# Patient Record
Sex: Female | Born: 1940 | Race: White | Hispanic: No | Marital: Married
Health system: Southern US, Community
[De-identification: ages and names within clinical notes are randomized; demographics above are authoritative.]

## PROBLEM LIST (undated history)

## (undated) DIAGNOSIS — C801 Malignant (primary) neoplasm, unspecified: Secondary | ICD-10-CM

## (undated) DIAGNOSIS — F419 Anxiety disorder, unspecified: Secondary | ICD-10-CM

## (undated) DIAGNOSIS — E079 Disorder of thyroid, unspecified: Secondary | ICD-10-CM

## (undated) DIAGNOSIS — J449 Chronic obstructive pulmonary disease, unspecified: Secondary | ICD-10-CM

## (undated) HISTORY — PX: ABDOMINAL HYSTERECTOMY: SHX81

## (undated) HISTORY — PX: COLON SURGERY: SHX602

## (undated) HISTORY — DX: Disorder of thyroid, unspecified: E07.9

## (undated) HISTORY — DX: Anxiety disorder, unspecified: F41.9

---

## 2002-09-05 ENCOUNTER — Encounter: Admission: RE | Admit: 2002-09-05 | Discharge: 2002-09-05 | Payer: Self-pay | Admitting: Family Medicine

## 2002-09-05 ENCOUNTER — Encounter: Payer: Self-pay | Admitting: Family Medicine

## 2006-08-17 ENCOUNTER — Ambulatory Visit (HOSPITAL_BASED_OUTPATIENT_CLINIC_OR_DEPARTMENT_OTHER): Admission: RE | Admit: 2006-08-17 | Discharge: 2006-08-18 | Payer: Self-pay | Admitting: Orthopedic Surgery

## 2007-11-14 ENCOUNTER — Ambulatory Visit: Payer: Self-pay | Admitting: Vascular Surgery

## 2008-09-03 ENCOUNTER — Ambulatory Visit: Payer: Self-pay | Admitting: Vascular Surgery

## 2008-09-19 ENCOUNTER — Ambulatory Visit: Payer: Self-pay | Admitting: Vascular Surgery

## 2009-01-20 ENCOUNTER — Encounter: Payer: Self-pay | Admitting: Gastroenterology

## 2009-02-26 ENCOUNTER — Encounter: Payer: Self-pay | Admitting: Gastroenterology

## 2009-03-09 ENCOUNTER — Encounter: Payer: Self-pay | Admitting: Gastroenterology

## 2009-03-11 ENCOUNTER — Encounter: Payer: Self-pay | Admitting: Gastroenterology

## 2009-03-12 ENCOUNTER — Encounter: Payer: Self-pay | Admitting: Gastroenterology

## 2009-03-12 ENCOUNTER — Encounter: Admission: RE | Admit: 2009-03-12 | Discharge: 2009-03-12 | Payer: Self-pay | Admitting: General Surgery

## 2009-03-17 ENCOUNTER — Telehealth: Payer: Self-pay | Admitting: Gastroenterology

## 2009-03-18 DIAGNOSIS — C20 Malignant neoplasm of rectum: Secondary | ICD-10-CM | POA: Insufficient documentation

## 2009-03-20 ENCOUNTER — Ambulatory Visit: Payer: Self-pay | Admitting: Gastroenterology

## 2009-03-26 ENCOUNTER — Ambulatory Visit (HOSPITAL_COMMUNITY): Admission: RE | Admit: 2009-03-26 | Discharge: 2009-03-26 | Payer: Self-pay | Admitting: Gastroenterology

## 2009-03-26 ENCOUNTER — Ambulatory Visit: Payer: Self-pay | Admitting: Gastroenterology

## 2009-04-02 ENCOUNTER — Telehealth: Payer: Self-pay | Admitting: Gastroenterology

## 2009-04-03 ENCOUNTER — Ambulatory Visit: Payer: Self-pay | Admitting: Oncology

## 2009-04-08 ENCOUNTER — Ambulatory Visit: Admission: RE | Admit: 2009-04-08 | Discharge: 2009-06-02 | Payer: Self-pay | Admitting: Radiation Oncology

## 2009-04-21 LAB — CBC WITH DIFFERENTIAL/PLATELET
BASO%: 0.5 % (ref 0.0–2.0)
Basophils Absolute: 0 10*3/uL (ref 0.0–0.1)
EOS%: 2.3 % (ref 0.0–7.0)
Eosinophils Absolute: 0.1 10*3/uL (ref 0.0–0.5)
HCT: 37.6 % (ref 34.8–46.6)
HGB: 13 g/dL (ref 11.6–15.9)
LYMPH%: 31.7 % (ref 14.0–49.7)
MCH: 29.4 pg (ref 25.1–34.0)
MCHC: 34.5 g/dL (ref 31.5–36.0)
MCV: 85.2 fL (ref 79.5–101.0)
MONO#: 0.6 10*3/uL (ref 0.1–0.9)
MONO%: 10.6 % (ref 0.0–14.0)
NEUT#: 3 10*3/uL (ref 1.5–6.5)
NEUT%: 54.9 % (ref 38.4–76.8)
Platelets: 258 10*3/uL (ref 145–400)
RBC: 4.41 10*6/uL (ref 3.70–5.45)
RDW: 13.1 % (ref 11.2–14.5)
WBC: 5.5 10*3/uL (ref 3.9–10.3)
lymph#: 1.7 10*3/uL (ref 0.9–3.3)

## 2009-05-06 LAB — COMPREHENSIVE METABOLIC PANEL
ALT: 11 U/L (ref 0–35)
AST: 15 U/L (ref 0–37)
Albumin: 3.9 g/dL (ref 3.5–5.2)
Alkaline Phosphatase: 44 U/L (ref 39–117)
BUN: 13 mg/dL (ref 6–23)
CO2: 27 mEq/L (ref 19–32)
Calcium: 9.2 mg/dL (ref 8.4–10.5)
Chloride: 103 mEq/L (ref 96–112)
Creatinine, Ser: 0.69 mg/dL (ref 0.40–1.20)
Glucose, Bld: 99 mg/dL (ref 70–99)
Potassium: 4.5 mEq/L (ref 3.5–5.3)
Sodium: 139 mEq/L (ref 135–145)
Total Bilirubin: 0.4 mg/dL (ref 0.3–1.2)
Total Protein: 6.7 g/dL (ref 6.0–8.3)

## 2009-05-06 LAB — CBC WITH DIFFERENTIAL/PLATELET
BASO%: 0.2 % (ref 0.0–2.0)
Basophils Absolute: 0 10*3/uL (ref 0.0–0.1)
EOS%: 4.1 % (ref 0.0–7.0)
Eosinophils Absolute: 0.1 10*3/uL (ref 0.0–0.5)
HCT: 36.5 % (ref 34.8–46.6)
HGB: 12.5 g/dL (ref 11.6–15.9)
LYMPH%: 15.2 % (ref 14.0–49.7)
MCH: 29.6 pg (ref 25.1–34.0)
MCHC: 34.2 g/dL (ref 31.5–36.0)
MCV: 86.4 fL (ref 79.5–101.0)
MONO#: 0.4 10*3/uL (ref 0.1–0.9)
MONO%: 12.5 % (ref 0.0–14.0)
NEUT#: 2.3 10*3/uL (ref 1.5–6.5)
NEUT%: 68 % (ref 38.4–76.8)
Platelets: 146 10*3/uL (ref 145–400)
RBC: 4.22 10*6/uL (ref 3.70–5.45)
RDW: 13 % (ref 11.2–14.5)
WBC: 3.4 10*3/uL — ABNORMAL LOW (ref 3.9–10.3)
lymph#: 0.5 10*3/uL — ABNORMAL LOW (ref 0.9–3.3)

## 2009-05-15 LAB — CBC WITH DIFFERENTIAL/PLATELET
BASO%: 0.6 % (ref 0.0–2.0)
Basophils Absolute: 0 10*3/uL (ref 0.0–0.1)
EOS%: 8.8 % — ABNORMAL HIGH (ref 0.0–7.0)
Eosinophils Absolute: 0.3 10*3/uL (ref 0.0–0.5)
HCT: 35 % (ref 34.8–46.6)
HGB: 12.3 g/dL (ref 11.6–15.9)
LYMPH%: 11.6 % — ABNORMAL LOW (ref 14.0–49.7)
MCH: 29.6 pg (ref 25.1–34.0)
MCHC: 35.1 g/dL (ref 31.5–36.0)
MCV: 84.3 fL (ref 79.5–101.0)
MONO#: 0.6 10*3/uL (ref 0.1–0.9)
MONO%: 16.9 % — ABNORMAL HIGH (ref 0.0–14.0)
NEUT#: 2.3 10*3/uL (ref 1.5–6.5)
NEUT%: 62.1 % (ref 38.4–76.8)
Platelets: 156 10*3/uL (ref 145–400)
RBC: 4.15 10*6/uL (ref 3.70–5.45)
RDW: 14.2 % (ref 11.2–14.5)
WBC: 3.6 10*3/uL — ABNORMAL LOW (ref 3.9–10.3)
lymph#: 0.4 10*3/uL — ABNORMAL LOW (ref 0.9–3.3)
nRBC: 0 % (ref 0–0)

## 2009-05-26 ENCOUNTER — Ambulatory Visit: Payer: Self-pay | Admitting: Oncology

## 2009-05-26 LAB — CBC WITH DIFFERENTIAL/PLATELET
BASO%: 0.1 % (ref 0.0–2.0)
Basophils Absolute: 0 10*3/uL (ref 0.0–0.1)
EOS%: 8.5 % — ABNORMAL HIGH (ref 0.0–7.0)
Eosinophils Absolute: 0.3 10*3/uL (ref 0.0–0.5)
HCT: 35 % (ref 34.8–46.6)
HGB: 12 g/dL (ref 11.6–15.9)
LYMPH%: 7.7 % — ABNORMAL LOW (ref 14.0–49.7)
MCH: 30.7 pg (ref 25.1–34.0)
MCHC: 34.4 g/dL (ref 31.5–36.0)
MCV: 89.4 fL (ref 79.5–101.0)
MONO#: 0.5 10*3/uL (ref 0.1–0.9)
MONO%: 12.6 % (ref 0.0–14.0)
NEUT#: 2.9 10*3/uL (ref 1.5–6.5)
NEUT%: 71.1 % (ref 38.4–76.8)
Platelets: 192 10*3/uL (ref 145–400)
RBC: 3.91 10*6/uL (ref 3.70–5.45)
RDW: 12.9 % (ref 11.2–14.5)
WBC: 4 10*3/uL (ref 3.9–10.3)
lymph#: 0.3 10*3/uL — ABNORMAL LOW (ref 0.9–3.3)

## 2009-05-28 LAB — URINALYSIS, MICROSCOPIC - CHCC
Bilirubin (Urine): NEGATIVE
Glucose: NEGATIVE g/dL
Ketones: NEGATIVE mg/dL
Nitrite: NEGATIVE
Protein: <30 mg/dL
Specific Gravity, Urine: 1.02 (ref 1.003–1.035)
pH: 6 (ref 4.6–8.0)

## 2009-05-29 LAB — URINE CULTURE

## 2009-06-01 LAB — URINALYSIS, MICROSCOPIC - CHCC
Bilirubin (Urine): NEGATIVE
Glucose: NEGATIVE g/dL
Ketones: NEGATIVE mg/dL
Nitrite: NEGATIVE
Protein: NEGATIVE mg/dL
Specific Gravity, Urine: 1.005 (ref 1.003–1.035)
pH: 7 (ref 4.6–8.0)

## 2009-06-02 LAB — URINE CULTURE

## 2009-06-22 ENCOUNTER — Encounter: Payer: Self-pay | Admitting: Gastroenterology

## 2009-07-07 ENCOUNTER — Ambulatory Visit: Payer: Self-pay | Admitting: Oncology

## 2009-07-16 ENCOUNTER — Encounter (INDEPENDENT_AMBULATORY_CARE_PROVIDER_SITE_OTHER): Payer: Self-pay | Admitting: General Surgery

## 2009-07-16 ENCOUNTER — Inpatient Hospital Stay (HOSPITAL_COMMUNITY): Admission: RE | Admit: 2009-07-16 | Discharge: 2009-07-21 | Payer: Self-pay | Admitting: General Surgery

## 2009-07-27 ENCOUNTER — Ambulatory Visit (HOSPITAL_COMMUNITY): Admission: RE | Admit: 2009-07-27 | Discharge: 2009-07-27 | Payer: Self-pay | Admitting: Oncology

## 2009-08-07 ENCOUNTER — Ambulatory Visit: Payer: Self-pay | Admitting: Oncology

## 2009-08-14 ENCOUNTER — Ambulatory Visit (HOSPITAL_COMMUNITY): Admission: RE | Admit: 2009-08-14 | Discharge: 2009-08-14 | Payer: Self-pay | Admitting: Oncology

## 2009-08-14 LAB — COMPREHENSIVE METABOLIC PANEL
ALT: 21 U/L (ref 0–35)
AST: 23 U/L (ref 0–37)
Albumin: 3.7 g/dL (ref 3.5–5.2)
Alkaline Phosphatase: 70 U/L (ref 39–117)
BUN: 16 mg/dL (ref 6–23)
CO2: 27 mEq/L (ref 19–32)
Calcium: 10 mg/dL (ref 8.4–10.5)
Chloride: 101 mEq/L (ref 96–112)
Creatinine, Ser: 0.9 mg/dL (ref 0.40–1.20)
Glucose, Bld: 102 mg/dL — ABNORMAL HIGH (ref 70–99)
Potassium: 4.5 mEq/L (ref 3.5–5.3)
Sodium: 137 mEq/L (ref 135–145)
Total Bilirubin: 0.6 mg/dL (ref 0.3–1.2)
Total Protein: 7.6 g/dL (ref 6.0–8.3)

## 2009-08-14 LAB — CBC WITH DIFFERENTIAL/PLATELET
BASO%: 0.3 % (ref 0.0–2.0)
Basophils Absolute: 0 10*3/uL (ref 0.0–0.1)
EOS%: 2.3 % (ref 0.0–7.0)
Eosinophils Absolute: 0.1 10*3/uL (ref 0.0–0.5)
HCT: 35.7 % (ref 34.8–46.6)
HGB: 12.4 g/dL (ref 11.6–15.9)
LYMPH%: 13.7 % — ABNORMAL LOW (ref 14.0–49.7)
MCH: 31.5 pg (ref 25.1–34.0)
MCHC: 34.9 g/dL (ref 31.5–36.0)
MCV: 90.4 fL (ref 79.5–101.0)
MONO#: 0.6 10*3/uL (ref 0.1–0.9)
MONO%: 11.3 % (ref 0.0–14.0)
NEUT#: 3.6 10*3/uL (ref 1.5–6.5)
NEUT%: 72.4 % (ref 38.4–76.8)
Platelets: 283 10*3/uL (ref 145–400)
RBC: 3.95 10*6/uL (ref 3.70–5.45)
RDW: 13.7 % (ref 11.2–14.5)
WBC: 5 10*3/uL (ref 3.9–10.3)
lymph#: 0.7 10*3/uL — ABNORMAL LOW (ref 0.9–3.3)

## 2009-08-14 LAB — PROTIME-INR
INR: 0.9 — ABNORMAL LOW (ref 2.00–3.50)
Protime: 10.8 Seconds (ref 10.6–13.4)

## 2009-08-17 ENCOUNTER — Ambulatory Visit (HOSPITAL_COMMUNITY): Admission: RE | Admit: 2009-08-17 | Discharge: 2009-08-17 | Payer: Self-pay | Admitting: General Surgery

## 2009-08-26 LAB — CBC WITH DIFFERENTIAL/PLATELET
BASO%: 0.2 % (ref 0.0–2.0)
Basophils Absolute: 0 10*3/uL (ref 0.0–0.1)
EOS%: 2 % (ref 0.0–7.0)
Eosinophils Absolute: 0.1 10*3/uL (ref 0.0–0.5)
HCT: 36.4 % (ref 34.8–46.6)
HGB: 12.5 g/dL (ref 11.6–15.9)
LYMPH%: 12.7 % — ABNORMAL LOW (ref 14.0–49.7)
MCH: 31.3 pg (ref 25.1–34.0)
MCHC: 34.4 g/dL (ref 31.5–36.0)
MCV: 90.9 fL (ref 79.5–101.0)
MONO#: 0.5 10*3/uL (ref 0.1–0.9)
MONO%: 11.3 % (ref 0.0–14.0)
NEUT#: 3.2 10*3/uL (ref 1.5–6.5)
NEUT%: 73.8 % (ref 38.4–76.8)
Platelets: 279 10*3/uL (ref 145–400)
RBC: 4.01 10*6/uL (ref 3.70–5.45)
RDW: 13.8 % (ref 11.2–14.5)
WBC: 4.3 10*3/uL (ref 3.9–10.3)
lymph#: 0.5 10*3/uL — ABNORMAL LOW (ref 0.9–3.3)

## 2009-08-26 LAB — COMPREHENSIVE METABOLIC PANEL
ALT: 20 U/L (ref 0–35)
AST: 25 U/L (ref 0–37)
Albumin: 3.8 g/dL (ref 3.5–5.2)
Alkaline Phosphatase: 67 U/L (ref 39–117)
BUN: 13 mg/dL (ref 6–23)
CO2: 27 mEq/L (ref 19–32)
Calcium: 9.8 mg/dL (ref 8.4–10.5)
Chloride: 103 mEq/L (ref 96–112)
Creatinine, Ser: 0.74 mg/dL (ref 0.40–1.20)
Glucose, Bld: 110 mg/dL — ABNORMAL HIGH (ref 70–99)
Potassium: 4.1 mEq/L (ref 3.5–5.3)
Sodium: 137 mEq/L (ref 135–145)
Total Bilirubin: 0.3 mg/dL (ref 0.3–1.2)
Total Protein: 7.6 g/dL (ref 6.0–8.3)

## 2009-08-27 LAB — RESEARCH LABS

## 2009-09-07 ENCOUNTER — Ambulatory Visit: Payer: Self-pay | Admitting: Oncology

## 2009-09-09 LAB — CBC WITH DIFFERENTIAL/PLATELET
BASO%: 0.7 % (ref 0.0–2.0)
Basophils Absolute: 0 10*3/uL (ref 0.0–0.1)
EOS%: 2.7 % (ref 0.0–7.0)
Eosinophils Absolute: 0.1 10*3/uL (ref 0.0–0.5)
HCT: 35.7 % (ref 34.8–46.6)
HGB: 12.4 g/dL (ref 11.6–15.9)
LYMPH%: 23.3 % (ref 14.0–49.7)
MCH: 30.1 pg (ref 25.1–34.0)
MCHC: 34.7 g/dL (ref 31.5–36.0)
MCV: 86.7 fL (ref 79.5–101.0)
MONO#: 0.6 10*3/uL (ref 0.1–0.9)
MONO%: 19.6 % — ABNORMAL HIGH (ref 0.0–14.0)
NEUT#: 1.6 10*3/uL (ref 1.5–6.5)
NEUT%: 53.7 % (ref 38.4–76.8)
Platelets: 162 10*3/uL (ref 145–400)
RBC: 4.12 10*6/uL (ref 3.70–5.45)
RDW: 13.4 % (ref 11.2–14.5)
WBC: 3 10*3/uL — ABNORMAL LOW (ref 3.9–10.3)
lymph#: 0.7 10*3/uL — ABNORMAL LOW (ref 0.9–3.3)
nRBC: 0 % (ref 0–0)

## 2009-09-09 LAB — COMPREHENSIVE METABOLIC PANEL
ALT: 28 U/L (ref 0–35)
AST: 26 U/L (ref 0–37)
Albumin: 3.8 g/dL (ref 3.5–5.2)
Alkaline Phosphatase: 75 U/L (ref 39–117)
BUN: 23 mg/dL (ref 6–23)
CO2: 24 mEq/L (ref 19–32)
Calcium: 10 mg/dL (ref 8.4–10.5)
Chloride: 105 mEq/L (ref 96–112)
Creatinine, Ser: 0.75 mg/dL (ref 0.40–1.20)
Glucose, Bld: 111 mg/dL — ABNORMAL HIGH (ref 70–99)
Potassium: 3.8 mEq/L (ref 3.5–5.3)
Sodium: 138 mEq/L (ref 135–145)
Total Bilirubin: 0.5 mg/dL (ref 0.3–1.2)
Total Protein: 7.4 g/dL (ref 6.0–8.3)

## 2009-09-10 LAB — RESEARCH LABS

## 2009-09-23 LAB — CBC WITH DIFFERENTIAL/PLATELET
BASO%: 1.4 % (ref 0.0–2.0)
Basophils Absolute: 0 10*3/uL (ref 0.0–0.1)
EOS%: 2.4 % (ref 0.0–7.0)
Eosinophils Absolute: 0.1 10*3/uL (ref 0.0–0.5)
HCT: 34.8 % (ref 34.8–46.6)
HGB: 12.3 g/dL (ref 11.6–15.9)
LYMPH%: 34.1 % (ref 14.0–49.7)
MCH: 30.3 pg (ref 25.1–34.0)
MCHC: 35.3 g/dL (ref 31.5–36.0)
MCV: 85.7 fL (ref 79.5–101.0)
MONO#: 0.5 10*3/uL (ref 0.1–0.9)
MONO%: 25 % — ABNORMAL HIGH (ref 0.0–14.0)
NEUT#: 0.8 10*3/uL — ABNORMAL LOW (ref 1.5–6.5)
NEUT%: 37.1 % — ABNORMAL LOW (ref 38.4–76.8)
Platelets: 107 10*3/uL — ABNORMAL LOW (ref 145–400)
RBC: 4.06 10*6/uL (ref 3.70–5.45)
RDW: 13.7 % (ref 11.2–14.5)
WBC: 2.1 10*3/uL — ABNORMAL LOW (ref 3.9–10.3)
lymph#: 0.7 10*3/uL — ABNORMAL LOW (ref 0.9–3.3)

## 2009-09-23 LAB — COMPREHENSIVE METABOLIC PANEL
ALT: 26 U/L (ref 0–35)
AST: 25 U/L (ref 0–37)
Albumin: 3.8 g/dL (ref 3.5–5.2)
Alkaline Phosphatase: 73 U/L (ref 39–117)
BUN: 25 mg/dL — ABNORMAL HIGH (ref 6–23)
CO2: 25 mEq/L (ref 19–32)
Calcium: 9.8 mg/dL (ref 8.4–10.5)
Chloride: 106 mEq/L (ref 96–112)
Creatinine, Ser: 0.94 mg/dL (ref 0.40–1.20)
Glucose, Bld: 96 mg/dL (ref 70–99)
Potassium: 4 mEq/L (ref 3.5–5.3)
Sodium: 137 mEq/L (ref 135–145)
Total Bilirubin: 0.6 mg/dL (ref 0.3–1.2)
Total Protein: 7.1 g/dL (ref 6.0–8.3)

## 2009-09-30 LAB — CBC WITH DIFFERENTIAL/PLATELET
BASO%: 1.1 % (ref 0.0–2.0)
Basophils Absolute: 0 10*3/uL (ref 0.0–0.1)
EOS%: 1.6 % (ref 0.0–7.0)
Eosinophils Absolute: 0 10*3/uL (ref 0.0–0.5)
HCT: 34.9 % (ref 34.8–46.6)
HGB: 11.9 g/dL (ref 11.6–15.9)
LYMPH%: 43.9 % (ref 14.0–49.7)
MCH: 30.1 pg (ref 25.1–34.0)
MCHC: 34.1 g/dL (ref 31.5–36.0)
MCV: 88.1 fL (ref 79.5–101.0)
MONO#: 0.5 10*3/uL (ref 0.1–0.9)
MONO%: 24.6 % — ABNORMAL HIGH (ref 0.0–14.0)
NEUT#: 0.5 10*3/uL — ABNORMAL LOW (ref 1.5–6.5)
NEUT%: 28.8 % — ABNORMAL LOW (ref 38.4–76.8)
Platelets: 182 10*3/uL (ref 145–400)
RBC: 3.96 10*6/uL (ref 3.70–5.45)
RDW: 14.4 % (ref 11.2–14.5)
WBC: 1.9 10*3/uL — ABNORMAL LOW (ref 3.9–10.3)
lymph#: 0.8 10*3/uL — ABNORMAL LOW (ref 0.9–3.3)

## 2009-10-07 ENCOUNTER — Ambulatory Visit: Payer: Self-pay | Admitting: Oncology

## 2009-10-07 LAB — CBC WITH DIFFERENTIAL/PLATELET
BASO%: 0.5 % (ref 0.0–2.0)
Basophils Absolute: 0 10*3/uL (ref 0.0–0.1)
EOS%: 1.5 % (ref 0.0–7.0)
Eosinophils Absolute: 0.1 10*3/uL (ref 0.0–0.5)
HCT: 36.8 % (ref 34.8–46.6)
HGB: 12.6 g/dL (ref 11.6–15.9)
LYMPH%: 19.4 % (ref 14.0–49.7)
MCH: 30.3 pg (ref 25.1–34.0)
MCHC: 34.2 g/dL (ref 31.5–36.0)
MCV: 88.5 fL (ref 79.5–101.0)
MONO#: 0.5 10*3/uL (ref 0.1–0.9)
MONO%: 13.1 % (ref 0.0–14.0)
NEUT#: 2.6 10*3/uL (ref 1.5–6.5)
NEUT%: 65.5 % (ref 38.4–76.8)
Platelets: 160 10*3/uL (ref 145–400)
RBC: 4.16 10*6/uL (ref 3.70–5.45)
RDW: 14.9 % — ABNORMAL HIGH (ref 11.2–14.5)
WBC: 4 10*3/uL (ref 3.9–10.3)
lymph#: 0.8 10*3/uL — ABNORMAL LOW (ref 0.9–3.3)

## 2009-10-07 LAB — COMPREHENSIVE METABOLIC PANEL
ALT: 19 U/L (ref 0–35)
AST: 24 U/L (ref 0–37)
Albumin: 3.8 g/dL (ref 3.5–5.2)
Alkaline Phosphatase: 67 U/L (ref 39–117)
BUN: 20 mg/dL (ref 6–23)
CO2: 24 mEq/L (ref 19–32)
Calcium: 9.7 mg/dL (ref 8.4–10.5)
Chloride: 106 mEq/L (ref 96–112)
Creatinine, Ser: 0.83 mg/dL (ref 0.40–1.20)
Glucose, Bld: 126 mg/dL — ABNORMAL HIGH (ref 70–99)
Potassium: 4.3 mEq/L (ref 3.5–5.3)
Sodium: 138 mEq/L (ref 135–145)
Total Bilirubin: 0.7 mg/dL (ref 0.3–1.2)
Total Protein: 7.1 g/dL (ref 6.0–8.3)

## 2009-10-08 LAB — RESEARCH LABS

## 2009-10-21 LAB — COMPREHENSIVE METABOLIC PANEL
ALT: 21 U/L (ref 0–35)
AST: 23 U/L (ref 0–37)
Albumin: 3.5 g/dL (ref 3.5–5.2)
Alkaline Phosphatase: 105 U/L (ref 39–117)
BUN: 19 mg/dL (ref 6–23)
CO2: 25 mEq/L (ref 19–32)
Calcium: 9.5 mg/dL (ref 8.4–10.5)
Chloride: 105 mEq/L (ref 96–112)
Creatinine, Ser: 0.96 mg/dL (ref 0.40–1.20)
Glucose, Bld: 95 mg/dL (ref 70–99)
Potassium: 3.9 mEq/L (ref 3.5–5.3)
Sodium: 138 mEq/L (ref 135–145)
Total Bilirubin: 0.4 mg/dL (ref 0.3–1.2)
Total Protein: 6.9 g/dL (ref 6.0–8.3)

## 2009-10-21 LAB — CBC WITH DIFFERENTIAL/PLATELET
BASO%: 0.1 % (ref 0.0–2.0)
Basophils Absolute: 0 10*3/uL (ref 0.0–0.1)
EOS%: 0.8 % (ref 0.0–7.0)
Eosinophils Absolute: 0.1 10*3/uL (ref 0.0–0.5)
HCT: 35.3 % (ref 34.8–46.6)
HGB: 12.1 g/dL (ref 11.6–15.9)
LYMPH%: 5.7 % — ABNORMAL LOW (ref 14.0–49.7)
MCH: 31 pg (ref 25.1–34.0)
MCHC: 34.2 g/dL (ref 31.5–36.0)
MCV: 90.6 fL (ref 79.5–101.0)
MONO#: 0.7 10*3/uL (ref 0.1–0.9)
MONO%: 5.4 % (ref 0.0–14.0)
NEUT#: 12.1 10*3/uL — ABNORMAL HIGH (ref 1.5–6.5)
NEUT%: 88 % — ABNORMAL HIGH (ref 38.4–76.8)
Platelets: 124 10*3/uL — ABNORMAL LOW (ref 145–400)
RBC: 3.89 10*6/uL (ref 3.70–5.45)
RDW: 15.3 % — ABNORMAL HIGH (ref 11.2–14.5)
WBC: 13.8 10*3/uL — ABNORMAL HIGH (ref 3.9–10.3)
lymph#: 0.8 10*3/uL — ABNORMAL LOW (ref 0.9–3.3)

## 2009-11-04 ENCOUNTER — Ambulatory Visit: Payer: Self-pay | Admitting: Oncology

## 2009-11-04 LAB — CBC WITH DIFFERENTIAL/PLATELET
BASO%: 0 % (ref 0.0–2.0)
Basophils Absolute: 0 10*3/uL (ref 0.0–0.1)
EOS%: 0.4 % (ref 0.0–7.0)
Eosinophils Absolute: 0.1 10*3/uL (ref 0.0–0.5)
HCT: 33.6 % — ABNORMAL LOW (ref 34.8–46.6)
HGB: 11.6 g/dL (ref 11.6–15.9)
LYMPH%: 3.7 % — ABNORMAL LOW (ref 14.0–49.7)
MCH: 31.7 pg (ref 25.1–34.0)
MCHC: 34.6 g/dL (ref 31.5–36.0)
MCV: 91.5 fL (ref 79.5–101.0)
MONO#: 0.8 10*3/uL (ref 0.1–0.9)
MONO%: 3.4 % (ref 0.0–14.0)
NEUT#: 20.5 10*3/uL — ABNORMAL HIGH (ref 1.5–6.5)
NEUT%: 92.5 % — ABNORMAL HIGH (ref 38.4–76.8)
Platelets: 92 10*3/uL — ABNORMAL LOW (ref 145–400)
RBC: 3.68 10*6/uL — ABNORMAL LOW (ref 3.70–5.45)
RDW: 16.7 % — ABNORMAL HIGH (ref 11.2–14.5)
WBC: 22.1 10*3/uL — ABNORMAL HIGH (ref 3.9–10.3)
lymph#: 0.8 10*3/uL — ABNORMAL LOW (ref 0.9–3.3)

## 2009-11-04 LAB — COMPREHENSIVE METABOLIC PANEL
ALT: 25 U/L (ref 0–35)
AST: 29 U/L (ref 0–37)
Albumin: 3.7 g/dL (ref 3.5–5.2)
Alkaline Phosphatase: 127 U/L — ABNORMAL HIGH (ref 39–117)
BUN: 15 mg/dL (ref 6–23)
CO2: 25 mEq/L (ref 19–32)
Calcium: 9.7 mg/dL (ref 8.4–10.5)
Chloride: 107 mEq/L (ref 96–112)
Creatinine, Ser: 1.39 mg/dL — ABNORMAL HIGH (ref 0.40–1.20)
Glucose, Bld: 101 mg/dL — ABNORMAL HIGH (ref 70–99)
Potassium: 4 mEq/L (ref 3.5–5.3)
Sodium: 139 mEq/L (ref 135–145)
Total Bilirubin: 0.7 mg/dL (ref 0.3–1.2)
Total Protein: 7.5 g/dL (ref 6.0–8.3)

## 2009-11-18 LAB — COMPREHENSIVE METABOLIC PANEL
ALT: 21 U/L (ref 0–35)
AST: 26 U/L (ref 0–37)
Albumin: 4 g/dL (ref 3.5–5.2)
Alkaline Phosphatase: 82 U/L (ref 39–117)
BUN: 23 mg/dL (ref 6–23)
CO2: 27 mEq/L (ref 19–32)
Calcium: 10 mg/dL (ref 8.4–10.5)
Chloride: 106 mEq/L (ref 96–112)
Creatinine, Ser: 1.26 mg/dL — ABNORMAL HIGH (ref 0.40–1.20)
Glucose, Bld: 101 mg/dL — ABNORMAL HIGH (ref 70–99)
Potassium: 4.5 mEq/L (ref 3.5–5.3)
Sodium: 140 mEq/L (ref 135–145)
Total Bilirubin: 0.7 mg/dL (ref 0.3–1.2)
Total Protein: 7.1 g/dL (ref 6.0–8.3)

## 2009-11-18 LAB — CBC WITH DIFFERENTIAL/PLATELET
BASO%: 0.4 % (ref 0.0–2.0)
Basophils Absolute: 0 10*3/uL (ref 0.0–0.1)
EOS%: 2.5 % (ref 0.0–7.0)
Eosinophils Absolute: 0.1 10*3/uL (ref 0.0–0.5)
HCT: 32.1 % — ABNORMAL LOW (ref 34.8–46.6)
HGB: 11 g/dL — ABNORMAL LOW (ref 11.6–15.9)
LYMPH%: 17 % (ref 14.0–49.7)
MCH: 31.9 pg (ref 25.1–34.0)
MCHC: 34.3 g/dL (ref 31.5–36.0)
MCV: 92.9 fL (ref 79.5–101.0)
MONO#: 0.4 10*3/uL (ref 0.1–0.9)
MONO%: 19.4 % — ABNORMAL HIGH (ref 0.0–14.0)
NEUT#: 1.3 10*3/uL — ABNORMAL LOW (ref 1.5–6.5)
NEUT%: 60.7 % (ref 38.4–76.8)
Platelets: 134 10*3/uL — ABNORMAL LOW (ref 145–400)
RBC: 3.45 10*6/uL — ABNORMAL LOW (ref 3.70–5.45)
RDW: 19.5 % — ABNORMAL HIGH (ref 11.2–14.5)
WBC: 2.2 10*3/uL — ABNORMAL LOW (ref 3.9–10.3)
lymph#: 0.4 10*3/uL — ABNORMAL LOW (ref 0.9–3.3)

## 2009-11-25 LAB — CBC WITH DIFFERENTIAL/PLATELET
BASO%: 1.3 % (ref 0.0–2.0)
Basophils Absolute: 0 10*3/uL (ref 0.0–0.1)
EOS%: 3.5 % (ref 0.0–7.0)
Eosinophils Absolute: 0.1 10*3/uL (ref 0.0–0.5)
HCT: 34.6 % — ABNORMAL LOW (ref 34.8–46.6)
HGB: 11.5 g/dL — ABNORMAL LOW (ref 11.6–15.9)
LYMPH%: 22.9 % (ref 14.0–49.7)
MCH: 31.2 pg (ref 25.1–34.0)
MCHC: 33.2 g/dL (ref 31.5–36.0)
MCV: 93.8 fL (ref 79.5–101.0)
MONO#: 0.5 10*3/uL (ref 0.1–0.9)
MONO%: 16.6 % — ABNORMAL HIGH (ref 0.0–14.0)
NEUT#: 1.8 10*3/uL (ref 1.5–6.5)
NEUT%: 55.7 % (ref 38.4–76.8)
Platelets: 245 10*3/uL (ref 145–400)
RBC: 3.69 10*6/uL — ABNORMAL LOW (ref 3.70–5.45)
RDW: 17.9 % — ABNORMAL HIGH (ref 11.2–14.5)
WBC: 3.1 10*3/uL — ABNORMAL LOW (ref 3.9–10.3)
lymph#: 0.7 10*3/uL — ABNORMAL LOW (ref 0.9–3.3)

## 2009-12-08 ENCOUNTER — Ambulatory Visit: Payer: Self-pay | Admitting: Oncology

## 2009-12-10 LAB — CBC WITH DIFFERENTIAL/PLATELET
BASO%: 0.1 % (ref 0.0–2.0)
Basophils Absolute: 0 10*3/uL (ref 0.0–0.1)
EOS%: 1.1 % (ref 0.0–7.0)
Eosinophils Absolute: 0.2 10*3/uL (ref 0.0–0.5)
HCT: 33.5 % — ABNORMAL LOW (ref 34.8–46.6)
HGB: 11.5 g/dL — ABNORMAL LOW (ref 11.6–15.9)
LYMPH%: 4.4 % — ABNORMAL LOW (ref 14.0–49.7)
MCH: 32.9 pg (ref 25.1–34.0)
MCHC: 34.2 g/dL (ref 31.5–36.0)
MCV: 96.2 fL (ref 79.5–101.0)
MONO#: 0.6 10*3/uL (ref 0.1–0.9)
MONO%: 4.1 % (ref 0.0–14.0)
NEUT#: 13.2 10*3/uL — ABNORMAL HIGH (ref 1.5–6.5)
NEUT%: 90.3 % — ABNORMAL HIGH (ref 38.4–76.8)
Platelets: 102 10*3/uL — ABNORMAL LOW (ref 145–400)
RBC: 3.48 10*6/uL — ABNORMAL LOW (ref 3.70–5.45)
RDW: 18.7 % — ABNORMAL HIGH (ref 11.2–14.5)
WBC: 14.6 10*3/uL — ABNORMAL HIGH (ref 3.9–10.3)
lymph#: 0.6 10*3/uL — ABNORMAL LOW (ref 0.9–3.3)

## 2009-12-10 LAB — COMPREHENSIVE METABOLIC PANEL
ALT: 20 U/L (ref 0–35)
AST: 30 U/L (ref 0–37)
Albumin: 4 g/dL (ref 3.5–5.2)
Alkaline Phosphatase: 109 U/L (ref 39–117)
BUN: 32 mg/dL — ABNORMAL HIGH (ref 6–23)
CO2: 25 mEq/L (ref 19–32)
Calcium: 9.6 mg/dL (ref 8.4–10.5)
Chloride: 107 mEq/L (ref 96–112)
Creatinine, Ser: 1.42 mg/dL — ABNORMAL HIGH (ref 0.40–1.20)
Glucose, Bld: 103 mg/dL — ABNORMAL HIGH (ref 70–99)
Potassium: 4.2 mEq/L (ref 3.5–5.3)
Sodium: 136 mEq/L (ref 135–145)
Total Bilirubin: 0.7 mg/dL (ref 0.3–1.2)
Total Protein: 7.3 g/dL (ref 6.0–8.3)

## 2009-12-22 LAB — COMPREHENSIVE METABOLIC PANEL
ALT: 19 U/L (ref 0–35)
AST: 28 U/L (ref 0–37)
Albumin: 3.8 g/dL (ref 3.5–5.2)
Alkaline Phosphatase: 68 U/L (ref 39–117)
BUN: 34 mg/dL — ABNORMAL HIGH (ref 6–23)
CO2: 24 mEq/L (ref 19–32)
Calcium: 9.3 mg/dL (ref 8.4–10.5)
Chloride: 108 mEq/L (ref 96–112)
Creatinine, Ser: 1.5 mg/dL — ABNORMAL HIGH (ref 0.40–1.20)
Glucose, Bld: 114 mg/dL — ABNORMAL HIGH (ref 70–99)
Potassium: 3.9 mEq/L (ref 3.5–5.3)
Sodium: 137 mEq/L (ref 135–145)
Total Bilirubin: 0.7 mg/dL (ref 0.3–1.2)
Total Protein: 6.6 g/dL (ref 6.0–8.3)

## 2009-12-22 LAB — CBC WITH DIFFERENTIAL/PLATELET
BASO%: 0.1 % (ref 0.0–2.0)
Basophils Absolute: 0 10*3/uL (ref 0.0–0.1)
EOS%: 2.8 % (ref 0.0–7.0)
Eosinophils Absolute: 0.1 10*3/uL (ref 0.0–0.5)
HCT: 27.5 % — ABNORMAL LOW (ref 34.8–46.6)
HGB: 9.6 g/dL — ABNORMAL LOW (ref 11.6–15.9)
LYMPH%: 14.3 % (ref 14.0–49.7)
MCH: 33.8 pg (ref 25.1–34.0)
MCHC: 35 g/dL (ref 31.5–36.0)
MCV: 96.8 fL (ref 79.5–101.0)
MONO#: 0.3 10*3/uL (ref 0.1–0.9)
MONO%: 11.9 % (ref 0.0–14.0)
NEUT#: 2 10*3/uL (ref 1.5–6.5)
NEUT%: 70.9 % (ref 38.4–76.8)
Platelets: 145 10*3/uL (ref 145–400)
RBC: 2.85 10*6/uL — ABNORMAL LOW (ref 3.70–5.45)
RDW: 17.6 % — ABNORMAL HIGH (ref 11.2–14.5)
WBC: 2.8 10*3/uL — ABNORMAL LOW (ref 3.9–10.3)
lymph#: 0.4 10*3/uL — ABNORMAL LOW (ref 0.9–3.3)

## 2009-12-30 ENCOUNTER — Ambulatory Visit: Payer: Self-pay | Admitting: Oncology

## 2009-12-30 LAB — CBC WITH DIFFERENTIAL/PLATELET
BASO%: 0.4 % (ref 0.0–2.0)
Basophils Absolute: 0 10*3/uL (ref 0.0–0.1)
EOS%: 2.6 % (ref 0.0–7.0)
Eosinophils Absolute: 0.1 10*3/uL (ref 0.0–0.5)
HCT: 30.3 % — ABNORMAL LOW (ref 34.8–46.6)
HGB: 10.1 g/dL — ABNORMAL LOW (ref 11.6–15.9)
LYMPH%: 16.5 % (ref 14.0–49.7)
MCH: 32.7 pg (ref 25.1–34.0)
MCHC: 33.3 g/dL (ref 31.5–36.0)
MCV: 98.1 fL (ref 79.5–101.0)
MONO#: 0.6 10*3/uL (ref 0.1–0.9)
MONO%: 26.1 % — ABNORMAL HIGH (ref 0.0–14.0)
NEUT#: 1.3 10*3/uL — ABNORMAL LOW (ref 1.5–6.5)
NEUT%: 54.4 % (ref 38.4–76.8)
Platelets: 172 10*3/uL (ref 145–400)
RBC: 3.09 10*6/uL — ABNORMAL LOW (ref 3.70–5.45)
RDW: 16.6 % — ABNORMAL HIGH (ref 11.2–14.5)
WBC: 2.3 10*3/uL — ABNORMAL LOW (ref 3.9–10.3)
lymph#: 0.4 10*3/uL — ABNORMAL LOW (ref 0.9–3.3)

## 2009-12-30 LAB — BASIC METABOLIC PANEL
BUN: 19 mg/dL (ref 6–23)
CO2: 26 mEq/L (ref 19–32)
Calcium: 9.6 mg/dL (ref 8.4–10.5)
Chloride: 109 mEq/L (ref 96–112)
Creatinine, Ser: 0.99 mg/dL (ref 0.40–1.20)
Glucose, Bld: 90 mg/dL (ref 70–99)
Potassium: 4.2 mEq/L (ref 3.5–5.3)
Sodium: 138 mEq/L (ref 135–145)

## 2010-01-13 LAB — CBC WITH DIFFERENTIAL/PLATELET
BASO%: 0.1 % (ref 0.0–2.0)
Basophils Absolute: 0 10*3/uL (ref 0.0–0.1)
EOS%: 0.9 % (ref 0.0–7.0)
Eosinophils Absolute: 0.1 10*3/uL (ref 0.0–0.5)
HCT: 31.9 % — ABNORMAL LOW (ref 34.8–46.6)
HGB: 11 g/dL — ABNORMAL LOW (ref 11.6–15.9)
LYMPH%: 5.1 % — ABNORMAL LOW (ref 14.0–49.7)
MCH: 34.9 pg — ABNORMAL HIGH (ref 25.1–34.0)
MCHC: 34.4 g/dL (ref 31.5–36.0)
MCV: 101.7 fL — ABNORMAL HIGH (ref 79.5–101.0)
MONO#: 0.6 10*3/uL (ref 0.1–0.9)
MONO%: 6.1 % (ref 0.0–14.0)
NEUT#: 9.1 10*3/uL — ABNORMAL HIGH (ref 1.5–6.5)
NEUT%: 87.8 % — ABNORMAL HIGH (ref 38.4–76.8)
Platelets: 107 10*3/uL — ABNORMAL LOW (ref 145–400)
RBC: 3.14 10*6/uL — ABNORMAL LOW (ref 3.70–5.45)
RDW: 17.4 % — ABNORMAL HIGH (ref 11.2–14.5)
WBC: 10.3 10*3/uL (ref 3.9–10.3)
lymph#: 0.5 10*3/uL — ABNORMAL LOW (ref 0.9–3.3)

## 2010-01-13 LAB — COMPREHENSIVE METABOLIC PANEL
ALT: 13 U/L (ref 0–35)
AST: 23 U/L (ref 0–37)
Albumin: 3.6 g/dL (ref 3.5–5.2)
Alkaline Phosphatase: 91 U/L (ref 39–117)
BUN: 19 mg/dL (ref 6–23)
CO2: 28 mEq/L (ref 19–32)
Calcium: 9.5 mg/dL (ref 8.4–10.5)
Chloride: 107 mEq/L (ref 96–112)
Creatinine, Ser: 1.24 mg/dL — ABNORMAL HIGH (ref 0.40–1.20)
Glucose, Bld: 98 mg/dL (ref 70–99)
Potassium: 4 mEq/L (ref 3.5–5.3)
Sodium: 139 mEq/L (ref 135–145)
Total Bilirubin: 0.2 mg/dL — ABNORMAL LOW (ref 0.3–1.2)
Total Protein: 6.8 g/dL (ref 6.0–8.3)

## 2010-01-27 LAB — CBC WITH DIFFERENTIAL/PLATELET
BASO%: 0 % (ref 0.0–2.0)
Basophils Absolute: 0 10*3/uL (ref 0.0–0.1)
EOS%: 1.3 % (ref 0.0–7.0)
Eosinophils Absolute: 0.2 10*3/uL (ref 0.0–0.5)
HCT: 32.2 % — ABNORMAL LOW (ref 34.8–46.6)
HGB: 11 g/dL — ABNORMAL LOW (ref 11.6–15.9)
LYMPH%: 5.2 % — ABNORMAL LOW (ref 14.0–49.7)
MCH: 35 pg — ABNORMAL HIGH (ref 25.1–34.0)
MCHC: 34.2 g/dL (ref 31.5–36.0)
MCV: 102.3 fL — ABNORMAL HIGH (ref 79.5–101.0)
MONO#: 0.9 10*3/uL (ref 0.1–0.9)
MONO%: 5.9 % (ref 0.0–14.0)
NEUT#: 12.7 10*3/uL — ABNORMAL HIGH (ref 1.5–6.5)
NEUT%: 87.6 % — ABNORMAL HIGH (ref 38.4–76.8)
Platelets: 134 10*3/uL — ABNORMAL LOW (ref 145–400)
RBC: 3.15 10*6/uL — ABNORMAL LOW (ref 3.70–5.45)
RDW: 16.8 % — ABNORMAL HIGH (ref 11.2–14.5)
WBC: 14.5 10*3/uL — ABNORMAL HIGH (ref 3.9–10.3)
lymph#: 0.8 10*3/uL — ABNORMAL LOW (ref 0.9–3.3)

## 2010-01-27 LAB — COMPREHENSIVE METABOLIC PANEL
ALT: 18 U/L (ref 0–35)
AST: 30 U/L (ref 0–37)
Albumin: 3.9 g/dL (ref 3.5–5.2)
Alkaline Phosphatase: 113 U/L (ref 39–117)
BUN: 17 mg/dL (ref 6–23)
CO2: 27 mEq/L (ref 19–32)
Calcium: 9.8 mg/dL (ref 8.4–10.5)
Chloride: 104 mEq/L (ref 96–112)
Creatinine, Ser: 1.12 mg/dL (ref 0.40–1.20)
Glucose, Bld: 99 mg/dL (ref 70–99)
Potassium: 4 mEq/L (ref 3.5–5.3)
Sodium: 135 mEq/L (ref 135–145)
Total Bilirubin: 0.6 mg/dL (ref 0.3–1.2)
Total Protein: 7 g/dL (ref 6.0–8.3)

## 2010-01-29 ENCOUNTER — Ambulatory Visit: Payer: Self-pay | Admitting: Oncology

## 2010-02-17 ENCOUNTER — Encounter: Admission: RE | Admit: 2010-02-17 | Discharge: 2010-02-17 | Payer: Self-pay | Admitting: General Surgery

## 2010-02-23 LAB — CBC WITH DIFFERENTIAL/PLATELET
BASO%: 0.1 % (ref 0.0–2.0)
Basophils Absolute: 0 10*3/uL (ref 0.0–0.1)
EOS%: 4.7 % (ref 0.0–7.0)
Eosinophils Absolute: 0.2 10*3/uL (ref 0.0–0.5)
HCT: 31.1 % — ABNORMAL LOW (ref 34.8–46.6)
HGB: 10.7 g/dL — ABNORMAL LOW (ref 11.6–15.9)
LYMPH%: 9.9 % — ABNORMAL LOW (ref 14.0–49.7)
MCH: 34 pg (ref 25.1–34.0)
MCHC: 34.5 g/dL (ref 31.5–36.0)
MCV: 98.7 fL (ref 79.5–101.0)
MONO#: 0.7 10*3/uL (ref 0.1–0.9)
MONO%: 17.6 % — ABNORMAL HIGH (ref 0.0–14.0)
NEUT#: 2.8 10*3/uL (ref 1.5–6.5)
NEUT%: 67.7 % (ref 38.4–76.8)
Platelets: 203 10*3/uL (ref 145–400)
RBC: 3.15 10*6/uL — ABNORMAL LOW (ref 3.70–5.45)
RDW: 14.1 % (ref 11.2–14.5)
WBC: 4.2 10*3/uL (ref 3.9–10.3)
lymph#: 0.4 10*3/uL — ABNORMAL LOW (ref 0.9–3.3)

## 2010-03-04 ENCOUNTER — Ambulatory Visit: Payer: Self-pay | Admitting: Oncology

## 2010-03-08 ENCOUNTER — Ambulatory Visit (HOSPITAL_COMMUNITY): Admission: RE | Admit: 2010-03-08 | Discharge: 2010-03-08 | Payer: Self-pay | Admitting: Oncology

## 2010-03-19 ENCOUNTER — Inpatient Hospital Stay (HOSPITAL_COMMUNITY): Admission: RE | Admit: 2010-03-19 | Discharge: 2010-03-22 | Payer: Self-pay | Admitting: General Surgery

## 2010-03-19 ENCOUNTER — Encounter (INDEPENDENT_AMBULATORY_CARE_PROVIDER_SITE_OTHER): Payer: Self-pay | Admitting: General Surgery

## 2010-05-17 ENCOUNTER — Ambulatory Visit: Payer: Self-pay | Admitting: Oncology

## 2010-06-03 ENCOUNTER — Ambulatory Visit (HOSPITAL_BASED_OUTPATIENT_CLINIC_OR_DEPARTMENT_OTHER): Admission: RE | Admit: 2010-06-03 | Discharge: 2010-06-03 | Payer: Self-pay | Admitting: General Surgery

## 2010-08-27 IMAGING — CR DG BE W/ CM - WO/W KUB
19 of 24 series · 19 of 24 positions shown · IV contrast (agent unspecified)
Comparison: Preoperative CT abdomen pelvis 03/12/2009.

CLINICAL DATA: 68-year-old female with history of rectal cancer
status post resection of some distal colon, radiation and
chemotherapy.  Ileostomy takedown planned.  Evaluate anastomatic
site.

SINGLE CONTRAST BARIUM ENEMA
Fluoroscopy Time: 3.7 minutes
Contrast: Water soluble Emnipaque-Y11 contrast.

[run (1 of 19)]
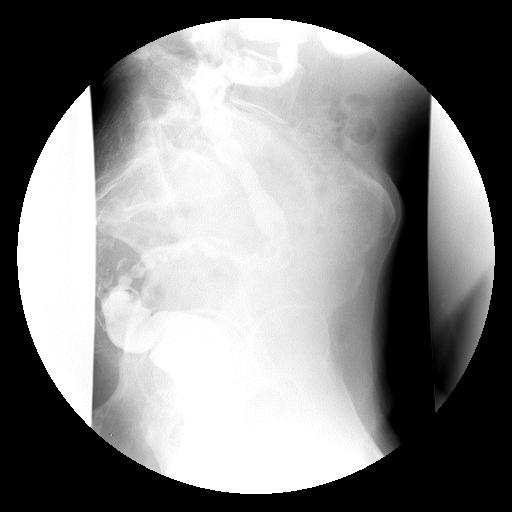

[run (2 of 19)]
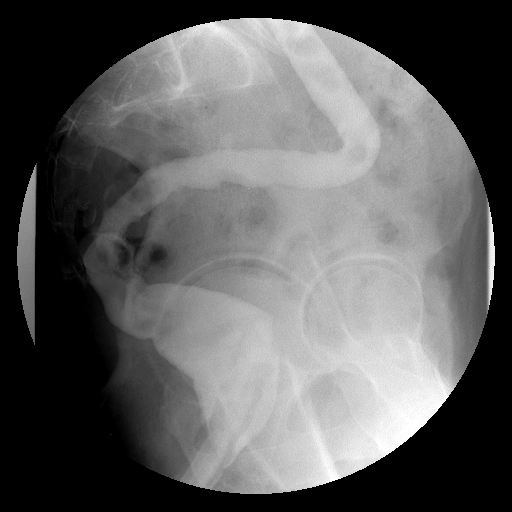

[run (3 of 19)]
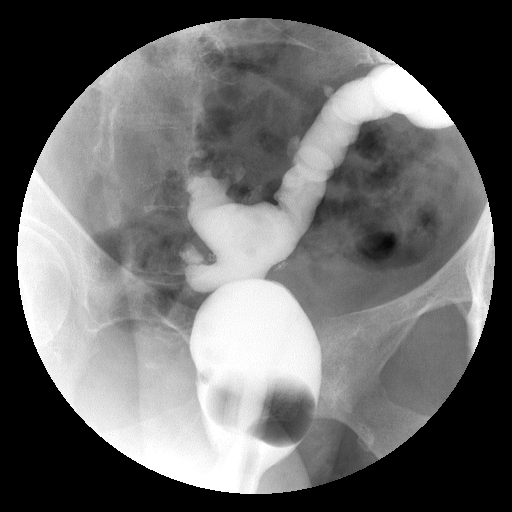

[run (4 of 19)]
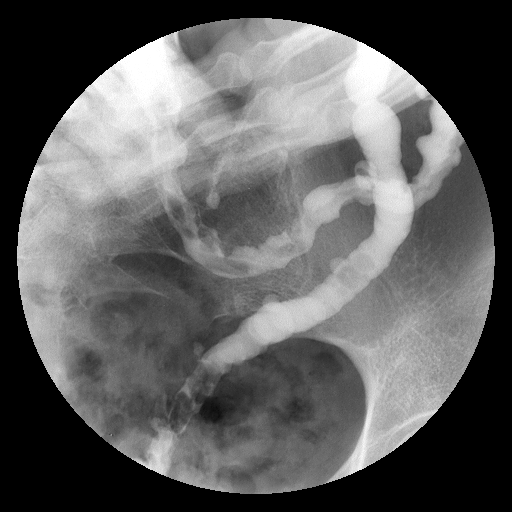

[run (5 of 19)]
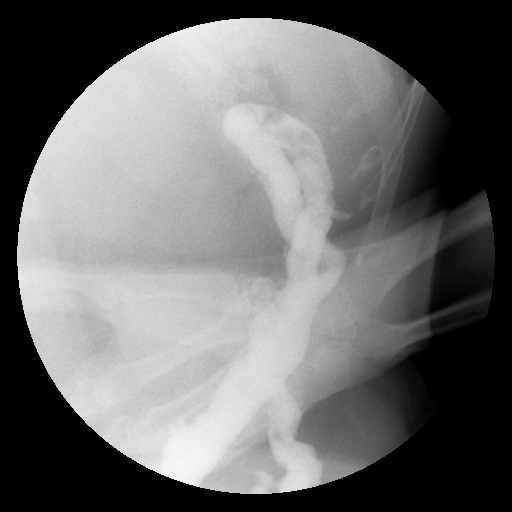

[run (6 of 19)]
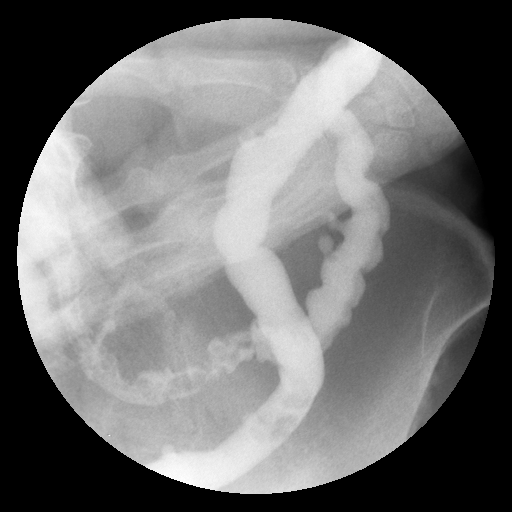

[run (7 of 19)]
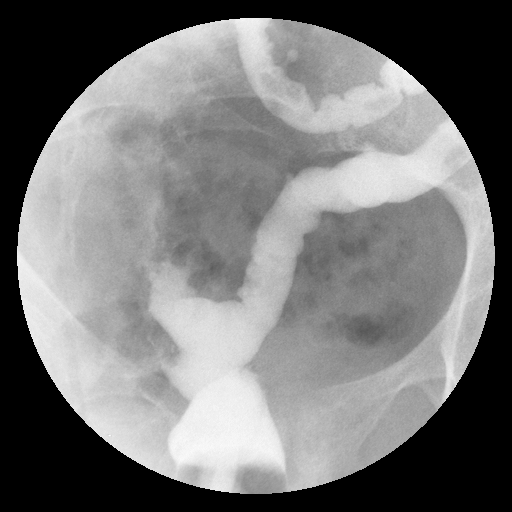

[run (8 of 19)]
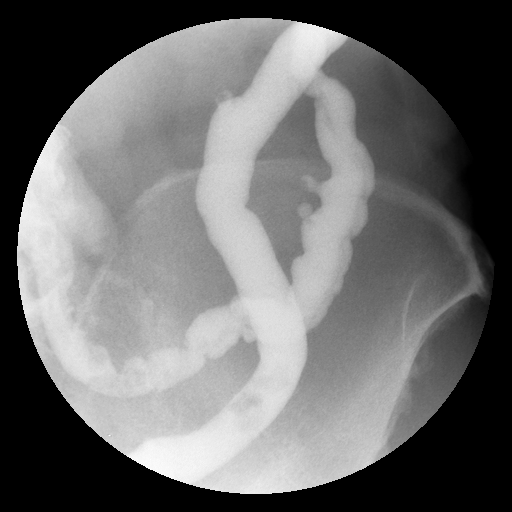

[run (9 of 19)]
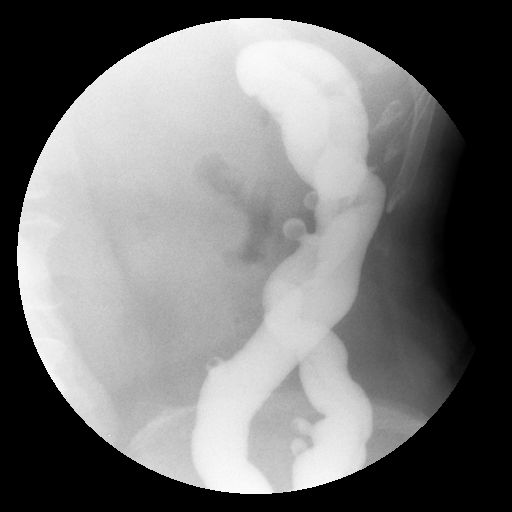

[run (10 of 19)]
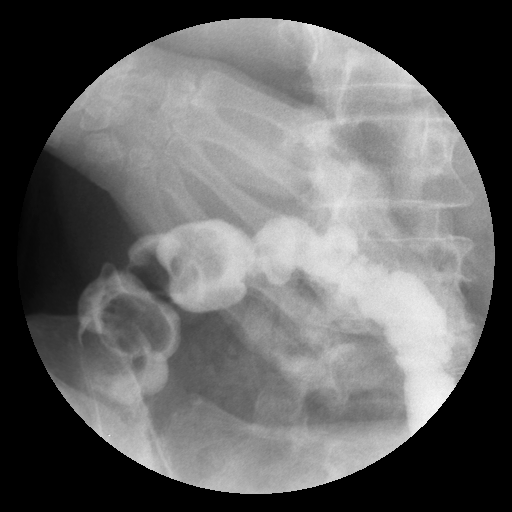

[run (11 of 19)]
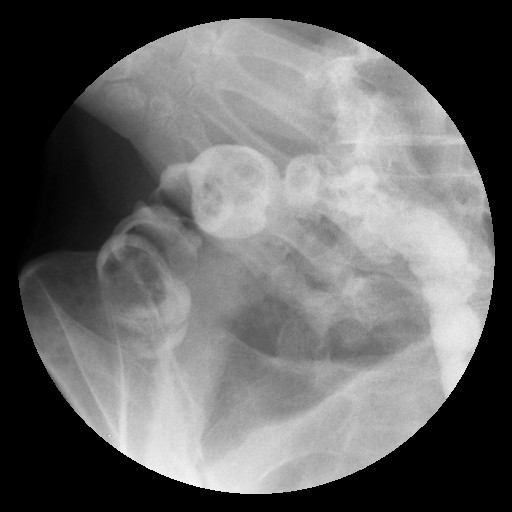

[run (12 of 19)]
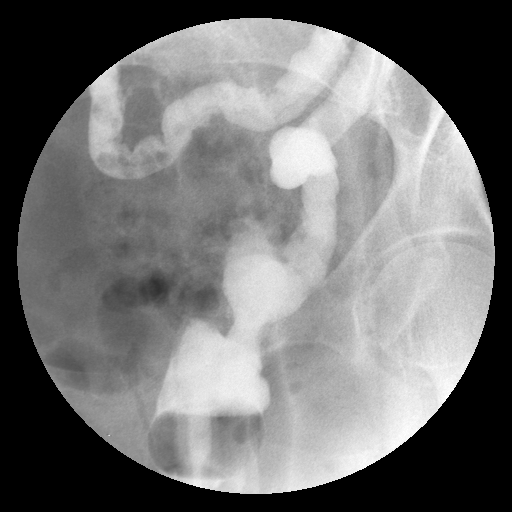

[run (13 of 19)]
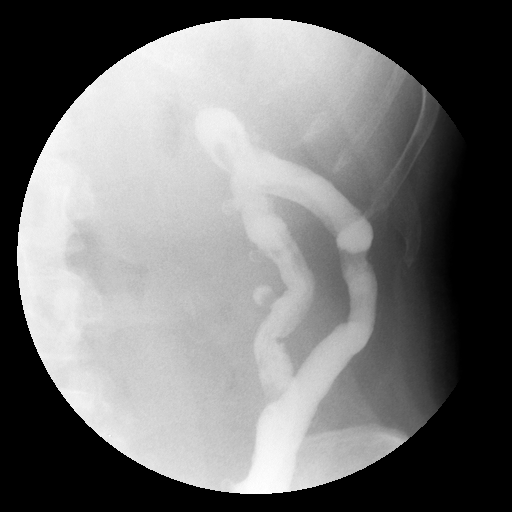

[run (14 of 19)]
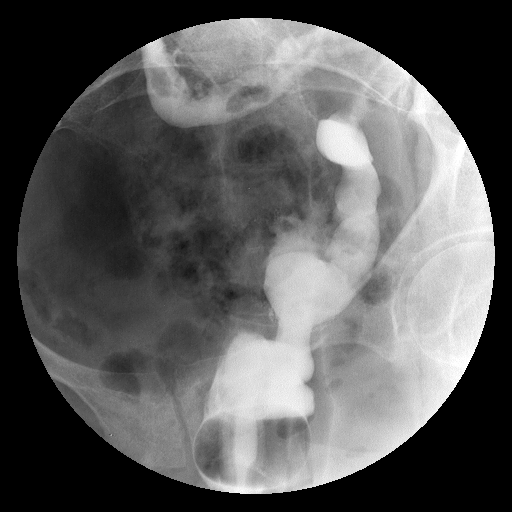

[run (15 of 19)]
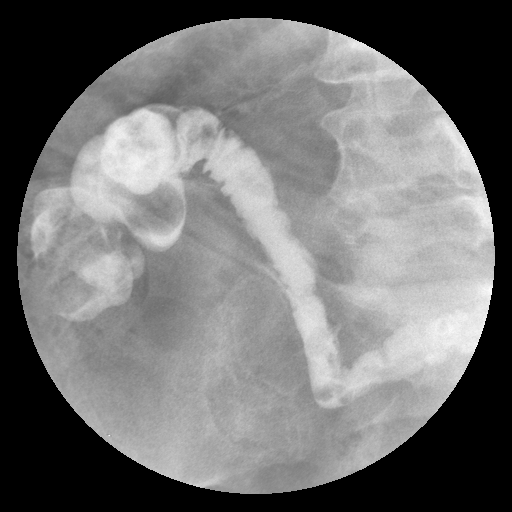

[run (16 of 19)]
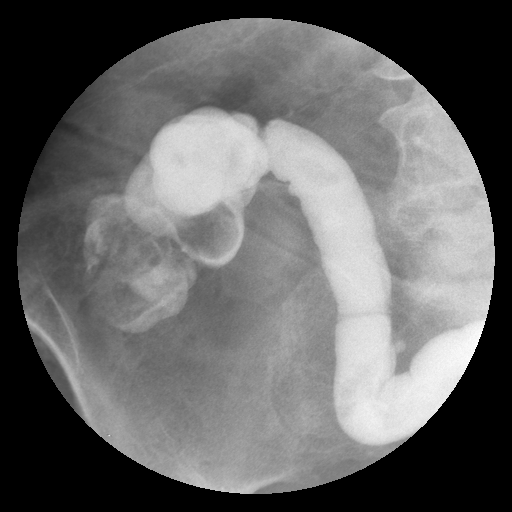

[run (17 of 19)]
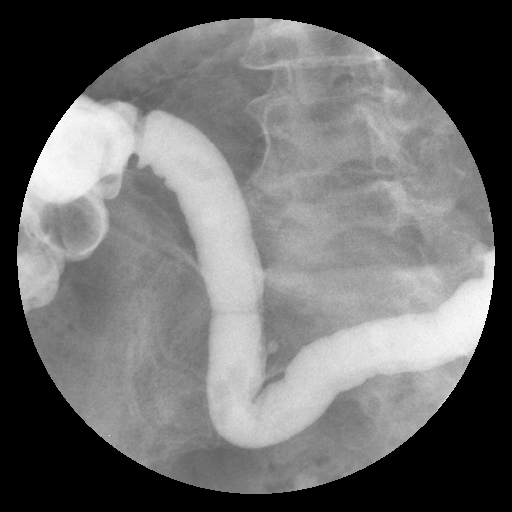

[run (18 of 19)]
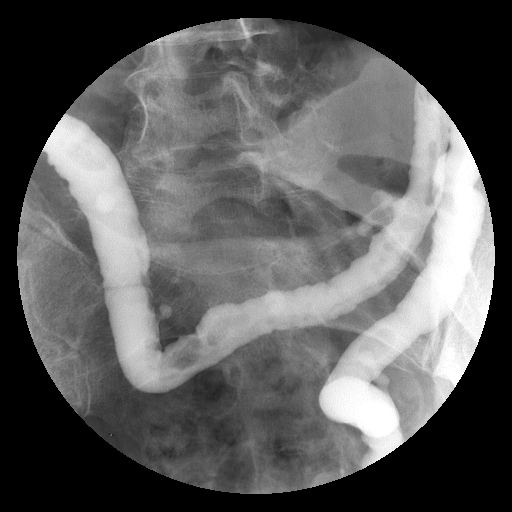

[run (19 of 19)]
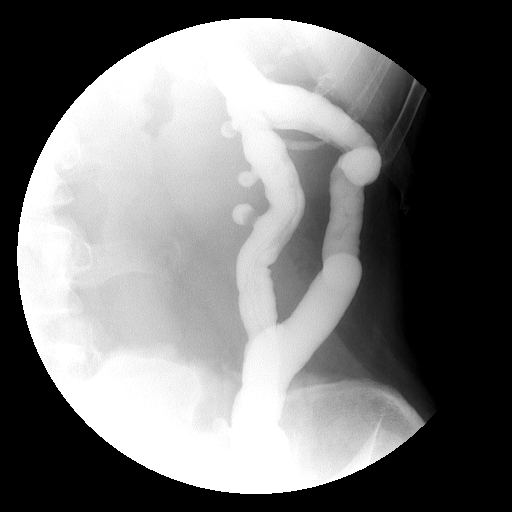

[19 of 24 positions shown; findings below may reference images not displayed]

FINDINGS: Two scout views of the abdomen pelvis demonstrate a ring
of staples in the left hemi pelvis overlying the coccyx, a right
lower quadrant ileostomy, and suggestion of a staple line at the
cecum. Nonobstructed bowel gas pattern.

The patient tolerated this single contrast study fairly well.
Significant pain and discomfort occurred during the retrograde
instillation of contrast.  The colon appears hypoplastic
throughout.  There are numerous diverticula.  There is no
obstruction to the retrograde flow of contrast to the level of the
cecum.  The contrast column terminates at the cecum without
extension into small bowel or the ileostomy site.

The distal large bowel anastomosis has an end-to-side appearance
(image/series 27).  No contrast leak is identified.

The splenic flexure is redundant.  The ascending colon and hepatic
flexure are within normal limits.

Postevacuation images demonstrate good emptying of the colon.
Residual contrast is noted in diverticula.  Anastomatic site
appears within normal limits.
IMPRESSION: 1.  Distal large bowel end-to-side anastomosis has a good
appearance.  No contrast leak or large bowel stricture.
2.  Diverticulosis.  Redundant splenic flexure.

## 2010-09-14 ENCOUNTER — Ambulatory Visit: Payer: Self-pay | Admitting: Oncology

## 2010-09-16 LAB — CBC WITH DIFFERENTIAL/PLATELET
BASO%: 0.2 % (ref 0.0–2.0)
Basophils Absolute: 0 10*3/uL (ref 0.0–0.1)
EOS%: 2.2 % (ref 0.0–7.0)
Eosinophils Absolute: 0.1 10*3/uL (ref 0.0–0.5)
HCT: 35.2 % (ref 34.8–46.6)
HGB: 12.2 g/dL (ref 11.6–15.9)
LYMPH%: 18.1 % (ref 14.0–49.7)
MCH: 30.8 pg (ref 25.1–34.0)
MCHC: 34.7 g/dL (ref 31.5–36.0)
MCV: 88.8 fL (ref 79.5–101.0)
MONO#: 0.5 10*3/uL (ref 0.1–0.9)
MONO%: 12.7 % (ref 0.0–14.0)
NEUT#: 2.6 10*3/uL (ref 1.5–6.5)
NEUT%: 66.8 % (ref 38.4–76.8)
Platelets: 197 10*3/uL (ref 145–400)
RBC: 3.96 10*6/uL (ref 3.70–5.45)
RDW: 13.4 % (ref 11.2–14.5)
WBC: 3.9 10*3/uL (ref 3.9–10.3)
lymph#: 0.7 10*3/uL — ABNORMAL LOW (ref 0.9–3.3)

## 2010-09-16 LAB — CEA: CEA: 1 ng/mL (ref 0.0–5.0)

## 2011-01-15 ENCOUNTER — Other Ambulatory Visit: Payer: Self-pay | Admitting: Oncology

## 2011-01-15 DIAGNOSIS — C20 Malignant neoplasm of rectum: Secondary | ICD-10-CM

## 2011-03-10 ENCOUNTER — Other Ambulatory Visit: Payer: Self-pay | Admitting: Oncology

## 2011-03-10 ENCOUNTER — Ambulatory Visit (HOSPITAL_COMMUNITY)
Admission: RE | Admit: 2011-03-10 | Discharge: 2011-03-10 | Disposition: A | Discharge status: Home / Self Care | Payer: Medicare Other | Source: Ambulatory Visit | Attending: Oncology | Admitting: Oncology

## 2011-03-10 ENCOUNTER — Encounter (HOSPITAL_BASED_OUTPATIENT_CLINIC_OR_DEPARTMENT_OTHER): Payer: Medicare Other | Admitting: Oncology

## 2011-03-10 DIAGNOSIS — C20 Malignant neoplasm of rectum: Secondary | ICD-10-CM

## 2011-03-10 DIAGNOSIS — Z90710 Acquired absence of both cervix and uterus: Secondary | ICD-10-CM | POA: Insufficient documentation

## 2011-03-10 DIAGNOSIS — K5730 Diverticulosis of large intestine without perforation or abscess without bleeding: Secondary | ICD-10-CM | POA: Insufficient documentation

## 2011-03-10 DIAGNOSIS — J438 Other emphysema: Secondary | ICD-10-CM | POA: Insufficient documentation

## 2011-03-10 DIAGNOSIS — N289 Disorder of kidney and ureter, unspecified: Secondary | ICD-10-CM | POA: Insufficient documentation

## 2011-03-10 LAB — CMP (CANCER CENTER ONLY)
ALT(SGPT): 23 U/L (ref 10–47)
AST: 27 U/L (ref 11–38)
Albumin: 3.8 g/dL (ref 3.3–5.5)
Alkaline Phosphatase: 58 U/L (ref 26–84)
BUN, Bld: 15 mg/dL (ref 7–22)
CO2: 30 mEq/L (ref 18–33)
Calcium: 9.2 mg/dL (ref 8.0–10.3)
Chloride: 104 mEq/L (ref 98–108)
Creat: 0.8 mg/dl (ref 0.6–1.2)
Glucose, Bld: 98 mg/dL (ref 73–118)
Potassium: 4.8 mEq/L — ABNORMAL HIGH (ref 3.3–4.7)
Sodium: 138 mEq/L (ref 128–145)
Total Bilirubin: 0.6 mg/dl (ref 0.20–1.60)
Total Protein: 7.2 g/dL (ref 6.4–8.1)

## 2011-03-10 LAB — CEA: CEA: 1.1 ng/mL (ref 0.0–5.0)

## 2011-03-10 MED ORDER — IOHEXOL 300 MG/ML  SOLN
100.0000 mL | Freq: Once | INTRAMUSCULAR | Status: AC | PRN
  Administered 2011-03-10: 100 mL via INTRAVENOUS

## 2011-03-14 LAB — POCT HEMOGLOBIN-HEMACUE: Hemoglobin: 11.3 g/dL — ABNORMAL LOW (ref 12.0–15.0)

## 2011-03-17 ENCOUNTER — Encounter (HOSPITAL_BASED_OUTPATIENT_CLINIC_OR_DEPARTMENT_OTHER): Payer: Medicare Other | Admitting: Oncology

## 2011-03-17 ENCOUNTER — Other Ambulatory Visit: Payer: Self-pay | Admitting: Oncology

## 2011-03-17 DIAGNOSIS — C20 Malignant neoplasm of rectum: Secondary | ICD-10-CM

## 2011-03-17 DIAGNOSIS — E7800 Pure hypercholesterolemia, unspecified: Secondary | ICD-10-CM

## 2011-03-17 DIAGNOSIS — Z1231 Encounter for screening mammogram for malignant neoplasm of breast: Secondary | ICD-10-CM

## 2011-03-17 DIAGNOSIS — D6959 Other secondary thrombocytopenia: Secondary | ICD-10-CM

## 2011-03-17 DIAGNOSIS — T451X5A Adverse effect of antineoplastic and immunosuppressive drugs, initial encounter: Secondary | ICD-10-CM

## 2011-03-21 LAB — BASIC METABOLIC PANEL
BUN: 8 mg/dL (ref 6–23)
CO2: 30 mEq/L (ref 19–32)
Calcium: 8.7 mg/dL (ref 8.4–10.5)
Chloride: 100 mEq/L (ref 96–112)
Creatinine, Ser: 0.71 mg/dL (ref 0.4–1.2)
GFR calc Af Amer: >60 mL/min (ref >60)
GFR calc non Af Amer: >60 mL/min (ref >60)
Glucose, Bld: 104 mg/dL — ABNORMAL HIGH (ref 70–99)
Potassium: 3.5 mEq/L (ref 3.5–5.1)
Sodium: 134 mEq/L — ABNORMAL LOW (ref 135–145)

## 2011-03-21 LAB — COMPREHENSIVE METABOLIC PANEL
ALT: 16 U/L (ref 0–35)
AST: 22 U/L (ref 0–37)
Albumin: 3.4 g/dL — ABNORMAL LOW (ref 3.5–5.2)
Alkaline Phosphatase: 89 U/L (ref 39–117)
BUN: 26 mg/dL — ABNORMAL HIGH (ref 6–23)
CO2: 25 mEq/L (ref 19–32)
Calcium: 10.2 mg/dL (ref 8.4–10.5)
Chloride: 104 mEq/L (ref 96–112)
Creatinine, Ser: 1.28 mg/dL — ABNORMAL HIGH (ref 0.4–1.2)
GFR calc Af Amer: 50 mL/min — ABNORMAL LOW (ref >60)
GFR calc non Af Amer: 41 mL/min — ABNORMAL LOW (ref >60)
Glucose, Bld: 104 mg/dL — ABNORMAL HIGH (ref 70–99)
Potassium: 6.1 mEq/L — ABNORMAL HIGH (ref 3.5–5.1)
Sodium: 135 mEq/L (ref 135–145)
Total Bilirubin: 0.4 mg/dL (ref 0.3–1.2)
Total Protein: 7.4 g/dL (ref 6.0–8.3)

## 2011-03-21 LAB — DIFFERENTIAL
Basophils Absolute: 0 10*3/uL (ref 0.0–0.1)
Basophils Relative: 0 % (ref 0–1)
Eosinophils Absolute: 0.2 10*3/uL (ref 0.0–0.7)
Eosinophils Relative: 4 % (ref 0–5)
Lymphocytes Relative: 7 % — ABNORMAL LOW (ref 12–46)
Lymphs Abs: 0.3 10*3/uL — ABNORMAL LOW (ref 0.7–4.0)
Monocytes Absolute: 0.8 10*3/uL (ref 0.1–1.0)
Monocytes Relative: 17 % — ABNORMAL HIGH (ref 3–12)
Neutro Abs: 3.5 10*3/uL (ref 1.7–7.7)
Neutrophils Relative %: 72 % (ref 43–77)

## 2011-03-21 LAB — TYPE AND SCREEN
ABO/RH(D): O POS
Antibody Screen: NEGATIVE

## 2011-03-21 LAB — CREATININE, SERUM
Creatinine, Ser: 1.13 mg/dL (ref 0.4–1.2)
GFR calc Af Amer: 58 mL/min — ABNORMAL LOW (ref >60)
GFR calc non Af Amer: 48 mL/min — ABNORMAL LOW (ref >60)

## 2011-03-21 LAB — CBC
HCT: 27.5 % — ABNORMAL LOW (ref 36.0–46.0)
HCT: 34.6 % — ABNORMAL LOW (ref 36.0–46.0)
Hemoglobin: 11.5 g/dL — ABNORMAL LOW (ref 12.0–15.0)
Hemoglobin: 9.5 g/dL — ABNORMAL LOW (ref 12.0–15.0)
MCHC: 33.2 g/dL (ref 30.0–36.0)
MCHC: 34.5 g/dL (ref 30.0–36.0)
MCV: 90.6 fL (ref 78.0–100.0)
MCV: 93.6 fL (ref 78.0–100.0)
Platelets: 227 10*3/uL (ref 150–400)
Platelets: 278 10*3/uL (ref 150–400)
RBC: 3.03 MIL/uL — ABNORMAL LOW (ref 3.87–5.11)
RBC: 3.7 MIL/uL — ABNORMAL LOW (ref 3.87–5.11)
RDW: 13.6 % (ref 11.5–15.5)
RDW: 14.8 % (ref 11.5–15.5)
WBC: 4 10*3/uL (ref 4.0–10.5)
WBC: 4.8 10*3/uL (ref 4.0–10.5)

## 2011-03-21 LAB — BUN: BUN: 27 mg/dL — ABNORMAL HIGH (ref 6–23)

## 2011-03-29 ENCOUNTER — Ambulatory Visit
Admission: RE | Admit: 2011-03-29 | Discharge: 2011-03-29 | Disposition: A | Discharge status: Home / Self Care | Payer: Medicare Other | Source: Ambulatory Visit | Attending: Oncology | Admitting: Oncology

## 2011-03-29 DIAGNOSIS — Z1231 Encounter for screening mammogram for malignant neoplasm of breast: Secondary | ICD-10-CM

## 2011-03-30 ENCOUNTER — Other Ambulatory Visit: Payer: Self-pay | Admitting: Oncology

## 2011-03-30 DIAGNOSIS — R928 Other abnormal and inconclusive findings on diagnostic imaging of breast: Secondary | ICD-10-CM

## 2011-04-01 ENCOUNTER — Ambulatory Visit
Admission: RE | Admit: 2011-04-01 | Discharge: 2011-04-01 | Disposition: A | Discharge status: Home / Self Care | Payer: Medicare Other | Source: Ambulatory Visit | Attending: Oncology | Admitting: Oncology

## 2011-04-01 DIAGNOSIS — R928 Other abnormal and inconclusive findings on diagnostic imaging of breast: Secondary | ICD-10-CM

## 2011-04-02 LAB — WOUND CULTURE
Culture: NO GROWTH
Gram Stain: NONE SEEN

## 2011-04-02 LAB — APTT: aPTT: 27 seconds (ref 24–37)

## 2011-04-03 LAB — CBC
HCT: 35.1 % — ABNORMAL LOW (ref 36.0–46.0)
HCT: 36 % (ref 36.0–46.0)
HCT: 37.6 % (ref 36.0–46.0)
Hemoglobin: 11.9 g/dL — ABNORMAL LOW (ref 12.0–15.0)
Hemoglobin: 12.5 g/dL (ref 12.0–15.0)
Hemoglobin: 12.9 g/dL (ref 12.0–15.0)
MCHC: 33.9 g/dL (ref 30.0–36.0)
MCHC: 34.4 g/dL (ref 30.0–36.0)
MCHC: 34.7 g/dL (ref 30.0–36.0)
MCV: 93.1 fL (ref 78.0–100.0)
MCV: 93.1 fL (ref 78.0–100.0)
MCV: 93.9 fL (ref 78.0–100.0)
Platelets: 181 10*3/uL (ref 150–400)
Platelets: 186 10*3/uL (ref 150–400)
Platelets: 196 10*3/uL (ref 150–400)
RBC: 3.74 MIL/uL — ABNORMAL LOW (ref 3.87–5.11)
RBC: 3.87 MIL/uL (ref 3.87–5.11)
RBC: 4.03 MIL/uL (ref 3.87–5.11)
RDW: 17.3 % — ABNORMAL HIGH (ref 11.5–15.5)
RDW: 17.3 % — ABNORMAL HIGH (ref 11.5–15.5)
RDW: 17.7 % — ABNORMAL HIGH (ref 11.5–15.5)
WBC: 4.3 10*3/uL (ref 4.0–10.5)
WBC: 6.5 10*3/uL (ref 4.0–10.5)
WBC: 8.4 10*3/uL (ref 4.0–10.5)

## 2011-04-03 LAB — COMPREHENSIVE METABOLIC PANEL
ALT: 17 U/L (ref 0–35)
AST: 24 U/L (ref 0–37)
Albumin: 3.6 g/dL (ref 3.5–5.2)
Alkaline Phosphatase: 46 U/L (ref 39–117)
BUN: 13 mg/dL (ref 6–23)
CO2: 27 mEq/L (ref 19–32)
Calcium: 9.1 mg/dL (ref 8.4–10.5)
Chloride: 105 mEq/L (ref 96–112)
Creatinine, Ser: 0.71 mg/dL (ref 0.4–1.2)
GFR calc Af Amer: >60 mL/min (ref >60)
GFR calc non Af Amer: >60 mL/min (ref >60)
Glucose, Bld: 121 mg/dL — ABNORMAL HIGH (ref 70–99)
Potassium: 3.6 mEq/L (ref 3.5–5.1)
Sodium: 140 mEq/L (ref 135–145)
Total Bilirubin: 0.5 mg/dL (ref 0.3–1.2)
Total Protein: 6.7 g/dL (ref 6.0–8.3)

## 2011-04-03 LAB — BASIC METABOLIC PANEL
BUN: 7 mg/dL (ref 6–23)
BUN: 9 mg/dL (ref 6–23)
CO2: 27 mEq/L (ref 19–32)
CO2: 28 mEq/L (ref 19–32)
Calcium: 8.7 mg/dL (ref 8.4–10.5)
Calcium: 8.9 mg/dL (ref 8.4–10.5)
Chloride: 104 mEq/L (ref 96–112)
Chloride: 104 mEq/L (ref 96–112)
Creatinine, Ser: 0.59 mg/dL (ref 0.4–1.2)
Creatinine, Ser: 0.65 mg/dL (ref 0.4–1.2)
GFR calc Af Amer: >60 mL/min (ref >60)
GFR calc Af Amer: >60 mL/min (ref >60)
GFR calc non Af Amer: >60 mL/min (ref >60)
GFR calc non Af Amer: >60 mL/min (ref >60)
Glucose, Bld: 114 mg/dL — ABNORMAL HIGH (ref 70–99)
Glucose, Bld: 148 mg/dL — ABNORMAL HIGH (ref 70–99)
Potassium: 3.8 mEq/L (ref 3.5–5.1)
Potassium: 4 mEq/L (ref 3.5–5.1)
Sodium: 137 mEq/L (ref 135–145)
Sodium: 140 mEq/L (ref 135–145)

## 2011-04-03 LAB — CROSSMATCH
ABO/RH(D): O POS
Antibody Screen: NEGATIVE

## 2011-04-03 LAB — PROTIME-INR
INR: 1 (ref 0.00–1.49)
Prothrombin Time: 12.9 seconds (ref 11.6–15.2)

## 2011-04-03 LAB — DIFFERENTIAL
Basophils Absolute: 0 10*3/uL (ref 0.0–0.1)
Basophils Relative: 1 % (ref 0–1)
Eosinophils Absolute: 0.1 10*3/uL (ref 0.0–0.7)
Eosinophils Relative: 3 % (ref 0–5)
Lymphocytes Relative: 15 % (ref 12–46)
Lymphs Abs: 0.7 10*3/uL (ref 0.7–4.0)
Monocytes Absolute: 0.5 10*3/uL (ref 0.1–1.0)
Monocytes Relative: 11 % (ref 3–12)
Neutro Abs: 3 10*3/uL (ref 1.7–7.7)
Neutrophils Relative %: 71 % (ref 43–77)

## 2011-04-03 LAB — ABO/RH: ABO/RH(D): O POS

## 2011-05-10 NOTE — H&P (Signed)
NAME:  Melanie [name], Melanie [name]             ACCOUNT NO.:  [id number]   MEDICAL RECORD NO.:  [id number]          PATIENT TYPE:  INP   LOCATION:  0003                         FACILITY:  [facility]   PHYSICIAN:  Adolph Pollack, M.D.DATE OF BIRTH:  July 27, 1941   DATE OF ADMISSION:  07/16/2009  DATE OF DISCHARGE:                              HISTORY & PHYSICAL   REASON FOR ADMISSION:  Voluntary resection rectal cancer.   HISTORY:  This 70 year old female was diagnosed with rectal cancer,  underwent preoperative chemotherapy and radiation therapy, now presents  for voluntary resection.   PAST MEDICAL HISTORY:  1. Rectal cancer.  2. Hypothyroidism.  3. Hypercholesterolemia.  4. Panic attacks.  5. Allergic rhinitis.   PREVIOUS OPERATIONS:  1. Rotator cuff repair.  2. Hysterectomy.   ALLERGIES:  MACRODANTIN.   MEDICATIONS:  Omeprazole.  Pravastatin.  L-thyroxine.  Premarin.  Allegra.  Detrol LA.  Hydrocodone.  Lorazepam.  Imodium p.r.n.  Tylenol.   SOCIAL HISTORY:  She is married.  No tobacco or alcohol use.   FAMILY HISTORY:  Notable for heart disease and lung cancer.   PHYSICAL EXAM:  GENERAL:  A well-developed, well-nourished female in no  acute distress.  Pleasant and cooperative.  VITAL SIGNS:  Temperature is 97.1, pulse 75, blood pressure 142/78,  respiratory rate 18, O2 saturation 95% on room air.  NECK:  Supple without masses.  RESPIRATORY:  Breath sounds equal and clear.  CARDIOVASCULAR:  Regular rate and rhythm.  No murmur.  ABDOMEN:  Soft with old transverse scar.  No hepatomegaly.  MUSCULOSKELETAL:  SCD on.  SKIN:  No jaundice.   IMPRESSION:  Rectal cancer status post chemoradiation therapy.   PLAN:  Low anterior resection of the rectum with protective loop  ileostomy.      Adolph Pollack, M.D.  Electronically Signed     TJR/MEDQ  D:  07/16/2009  T:  07/16/2009  Job:  [id number]   cc:   Griffith Citron, M.D.  Fax: [phone]   Ursula Beath, MD  Fax:  [phone]   Rachael Fee, MD  [address]  [place], [place] [address]   Leighton Roach Truett Perna, M.D.  Fax: [phone]   Maryln Gottron, M.D.  Fax: [phone]

## 2011-05-10 NOTE — Op Note (Signed)
NAME:  Melanie [name], Melanie [name]             ACCOUNT NO.:  [id number]   MEDICAL RECORD NO.:  [id number]          PATIENT TYPE:  AMB   LOCATION:  DAY                          FACILITY:  [facility]   PHYSICIAN:  Adolph Pollack, M.D.DATE OF BIRTH:  1941-08-11   DATE OF PROCEDURE:  08/17/2009  DATE OF DISCHARGE:                               OPERATIVE REPORT   PREOPERATIVE DIAGNOSIS:  Stage III rectal cancer.   POSTOPERATIVE DIAGNOSIS:  Stage III rectal cancer.   PROCEDURE:  Insertion of Port-A-Cath in the right subclavian vein.   SURGEON:  Dr. Abbey Chatters.   ANESTHESIA:  Local (lidocaine) with MAC.   INDICATIONS:  This is a 70 year old female who has stage III rectal  cancer and she requires long-term venous access for chemotherapy.  She  now presents for that.  Procedure risks were discussed with her preop.   TECHNIQUE:  She was brought to the operating room, placed supine on the  operating table, positioned appropriately and given intravenous  sedation.  The neck and chest was sterilely prepped and draped.  Using  an ultrasound, I identified the internal jugular vein and was able to  cannulate once but could not until the aspirate well.  I tried to  recannulate two other times, but had a carotid artery puncture and I  held direct pressure over that area.  At this point in time using  ultrasound, the vein appeared to be collapsed; thus, I had abort the  ultrasound procedure and approach the right subclavian area.   I then anesthetized the infraclavicular area in the right region of the  right upper chest wall, was able to cannulate the right subclavian vein  on the first stick and thread a wire into the superior vena cava under  fluoroscopic guidance.  Following this, I then anesthetized the upper  lateral right chest wall superficially and deep and made an incision  sharply and used the electrocautery to create a subcutaneous pocket for  the port.  I then made an incision around the wire  in the subclavian  area and I passed the catheter from the inferior up to the superior  incision.  The vein was then dilated and dilator introducer complex was  then passed into the right subclavian vein and verified going into the  vena cava by fluoroscopy.   The dilator and wire were removed and the catheter was threaded through  the peel-away sheath introducer.  The peel-away sheath introducer was  then removed.  Under fluoroscopic guidance, I pulled the catheter back  until the tip was in the distal superior vena cava.  I then cut the  catheter and attached it to the port and was able to aspirate blood and  flush easily.  The port was anchored to the chest wall with interrupted  2-0 Vicryl sutures.  Concentrated solution was placed in the port.   I then closed the port site in two layers approximating the subcutaneous  tissue with a running 2-0 Vicryl suture and closing the skin with a 4-0  Monocryl subcuticular stitch.  The small subclavian incision was closed  with a 4-0  Monocryl subcuticular stitch.  Steri-Strips were applied.   She tolerated the procedure without apparent complications and was taken  to recovery in satisfactory condition where a portable chest x-ray is  pending.      Adolph Pollack, M.D.  Electronically Signed     TJR/MEDQ  D:  08/17/2009  T:  08/17/2009  Job:  [id number]

## 2011-05-10 NOTE — Assessment & Plan Note (Signed)
OFFICE VISIT   [name], Melanie [name]  DOB:  March 13, 1941                                       09/03/2008  YQMVH#:[id number]   The patient presents today for sclerotherapy of a reticular varicosity  at the buttocks crease on her right leg.  She has reticular varicosity  at this area which is causing her discomfort with sitting and also where  her panties cross this area.  After informed consent she underwent  sclerotherapy of this reticular varicosity.  We used high intensity  light for better visualization of the reticular under the skin and this  was injected with 0.3 mL sodium tetradecyl.  She tolerated the procedure  without immediate complication and I will see her again in 2 weeks for  continued followup.   Larina Earthly, M.D.  Electronically Signed   TFE/MEDQ  D:  09/03/2008  T:  09/04/2008  Job:  [id number]

## 2011-05-10 NOTE — Assessment & Plan Note (Signed)
OFFICE VISIT   Zollars, Dala L  DOB:  1941-11-10                                       09/19/2008  FIEPP#:[id number]   The patient presents today for 2-week followup after sclerotherapy of  reticular varicosities that were painful in her right buttock crease.  She reports that she has had significant resolution of the discomfort  related to this.   PHYSICAL EXAMINATION:  She does not have any bruising or staining in the  area of sclerotherapy.   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  I do not see any evidence  of thrombophlebitis.  I am quite pleased with her initial result, as is  the patient.  She will continue her usual activities and see [place] again on  as needed basis.   Larina Earthly, M.D.  Electronically Signed   TFE/MEDQ  D:  09/19/2008  T:  09/22/2008  Job:  [id number]   cc:   Gloriajean Dell. Andrey Campanile, M.D.

## 2011-05-10 NOTE — Op Note (Signed)
NAME:  Melanie [name], Melanie [name]             ACCOUNT NO.:  [id number]   MEDICAL RECORD NO.:  [id number]          PATIENT TYPE:  INP   LOCATION:  0003                         FACILITY:  [facility]   PHYSICIAN:  Adolph Pollack, M.D.DATE OF BIRTH:  Sep 15, 1941   DATE OF PROCEDURE:  07/16/2009  DATE OF DISCHARGE:                               OPERATIVE REPORT   PREOPERATIVE DIAGNOSIS:  Rectal cancer.   POSTOPERATIVE DIAGNOSIS:  Rectal cancer.   PROCEDURE:  Low anterior resection of the rectum and protective loop  ileostomy, incidental appendectomy.   SURGEON:  Adolph Pollack, M.D.   ASSISTANT:  Anselm Pancoast. Zachery Dakins, M.D.   ANESTHESIA:  General.   INDICATIONS:  This is a 70 year old female with a T3 rectal cancer who  underwent concomitant Xeloda and radiation therapy preoperatively.  She  has completed the radiation and now presents for resection.  We  discussed the procedure and risks preoperatively.   TECHNIQUE:  She was brought to the operating room, placed supine on the  operating table and a general anesthetic was administered.  A Foley  catheter was placed in the bladder.  She was placed in lithotomy  position.  Rigid sigmoidoscopy was performed and the lesion was noted  about 11-12 cm from the anal verge.  The abdominal wall and perineal  areas were then sterilely prepped and draped.  A lower midline incision  was made dividing the skin, subcutaneous tissue and fascia.  There  peritoneum with electrocautery.  The self-retaining retractor was used.  The tumor was palpable just below the level of the peritoneal reflection  area.  I then mobilized the sigmoid colon, descending colon by dividing  the lateral attachments.  The left ureter was identified and then the  right ureter identified and their paths traced.  The sigmoid colon was  divided with the GIA stapler.  Going straight posteriorly, the mesentery  was divided using a LigaSure and large mesenteric and the larger vessels  divided between silk ties.  We approached using LigaSure and  electrocautery and posterior dissection was performed that was to 3-4 cm  below the area of the tumor.  I then mobilized and divided lateral  attachments using the LigaSure as well, keeping the ureters in sight.   The peritoneal reflection was immobilized and I skeletonized the rectum,  3-4 cm inferior to the tumor.  I then verified the position of the tumor  with rigid sigmoidoscopy.  I then divided the rectum with the linear non-  cutting stapler.  The proximal end of the specimen was marked with a  suture and the specimen taken off the field, opened up and the lesion  identified.   Following this, I identified the descending colon.  It was mobile and  easily fit into the pelvis.  I removed the staple line and placed a size  29 EEA anvil into the descending colon and resealed with the GIA  stapler.  I brought the anvil out the anterior tenia and put a 2-0  Prolene pursestring suture around it.  The handle of the EEA stapler was  then passed into the rectum  and end-to-side Baker-type anastomosis was  performed with a size 29 EEA stapler.  Two intact donuts were noted. The  anastomosis was patent, viable, and under no tension.  I then placed the  anastomosis under saline solution and occluded just proximal to the  anastomosis.  Air was insufflated in the rectum and no leak was noted.   The fluid was evacuated and gloves were changed.   I inspected the area for bleeding and hemostasis was adequate.  I the  copiously irrigated out the abdominal cavity and evacuated the fluid and  saline.  I planned to do a protective loop ileostomy.  I identified the  cecum and the appendix.  I divided the mesoappendix down the level cecum  and amputated the appendix off the cecum with a GIA stapler.  I then was  able to identify a spot in the distal ileal area that would come up the  abdominal wall easily.  In the right lower quadrant, a  circular skin  incision was made.  The subcutaneous tissue was separated and a cruciate  incision made in the anterior and posterior rectus sheath.  The size 14  red rubber catheter was placed through a small mesenteric defect around  the ileum and was brought up to the circular incision onto the skin.  I  then anchored the red Robinson catheter to the skin superiorly and  inferiorly with a 4-0 nylon suture to act as an ileostomy bar.  The  remaining catheter was cut and removed.   At this point in time, I asked for sponge count and instrument count and  these were reported to be correct.   The liver was examined.  No lesions were noted.  There were no  metastases in the pelvis.  The fascia was then closed with running #1  PDS suture.  The subcutaneous tissue was irrigated and the skin was  closed with staples.  The loop ileostomy matured with interpreted 3-0  Vicryl suture in an appliance placed.   She tolerated the procedure without any apparent complications and was  taken to the recovery room in satisfactory condition.      Adolph Pollack, M.D.  Electronically Signed     TJR/MEDQ  D:  07/16/2009  T:  07/16/2009  Job:  [id number]

## 2011-05-10 NOTE — Consult Note (Signed)
NEW PATIENT CONSULTATION   Melanie [name]  DOB:  08-Sep-1941                                       11/14/2007  ZOXWR#:[id number]   The patient presents today concerning a painful reticular varicosity in  her right buttock.  She has mild telangiectasias and spider veins in  both lower extremities but does have a reticular varicosity in the  buttock crease on the right buttock.  She reports this causes pain with  sitting and actually has to prop up on her left elbow when she is  sitting due to this discomfort.  She does not have any history of deep  venous thrombosis or superficial thrombophlebitis.   PAST HISTORY:  Negative for diabetes, cancer, hypertension, or other  cerebrovascular disease.  She does have elevated lipids.   She is married with 4 children and does not smoke, having quit in 2004,  and does not drink alcohol.   REVIEW OF SYSTEMS:  Negative besides arthritis and joint pains.  Particularly, no CV, pulmonary, vascular, GU, GI, hematologic, or  neurologic discomforts.   ALLERGIES:  MACRODANTIN.   PHYSICAL EXAM:  Well-developed, well-nourished, white female appearing  stated age of 71.  Blood pressure is 158/84.  Pulse 76.  Respirations  16.  Her dorsalis pedis pulses are 2+ bilaterally.  She does have  scattered telangiectasias in both lower extremities and the reticular  varicosity particularly of her left knee.  Her right buttocks noted for  a reticular varicosity runs in the crease at the lower portion of her  right buttock.  This does have flow in it and is compressible.  I  discussed options with the patient.  I explained that this is a rather  unusual position for this and is really causing her discomfort only  because of location.  This is certainly within the size it can be  successfully treated with sclerotherapy and I would recommend the  sclerotherapy for relief of her symptoms.  We will ensure insurance  coverage for this and we  will proceed at her convenience with outpatient  sclerotherapy.   Larina Earthly, M.D.  Electronically Signed   TFE/MEDQ  D:  11/14/2007  T:  11/15/2007  Job:  [id number]   cc:   Gloriajean Dell. Andrey Campanile, M.D.

## 2011-05-13 NOTE — Op Note (Signed)
NAME:  Farrier, Shali             ACCOUNT NO.:  [id number]   MEDICAL RECORD NO.:  [id number]          PATIENT TYPE:  AMB   LOCATION:  DSC                          FACILITY:  [facility]   PHYSICIAN:  Katy Fitch. Sypher, M.D. 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   OPERATION:  1. Diagnostic arthroscopy, left shoulder.  2. Arthroscopic debridement of degenerative glenoid labrum.  3. Open resection of distal clavicle.  4. Subacromial decompression and bursectomy with debridement of necrotic      rotator cuff.  5. Reconstruction of avulsed left rotator cuff utilizing 3 Bio-Corkscrew      Anchors for medial footprint of the supraspinatus, infraspinatus and      teres minor muscles and a single push-lock over-the-top inset suture to      reinforce the repair.   OPERATIONS:  Josephine Igo, M.D.   ASSISTANT:  Annye Rusk, P.A.-C.   ANESTHESIA:  General by endotracheal technique, supervising anesthesiologist  is Dr. Jairo Ben.   INDICATIONS:  Melanie [name] is a 65-year woman referred through the  courtesy of Dr. Dara Hoyer for evaluation of a painful left shoulder.  She  had a history of severe pain during the past five months.  She had chronic  night time discomfort consistent with impingement.  Clinical examination  revealed AC arthropathy, weakness of abduction and external rotation.  An  MRI of the shoulder  confirmed a chronic retracted infraspinatus tear, a more  acute supraspinatus tear and undermining of the teres minor.  She had  extensive labral degeneration.  Her subscapularis had minor degenerative  changes anteriorly but did not have retracted tear.   After informed consent, she is brought to the operating room at this time,  anticipating reconstruction of her rotator cuff.  She understands we will  decompress the cuff by acromioplasty and distal clavicle resection and will  debride her labrum.  Questions were invited and answered in detail in the  office as well as in the preoperative holding area with Mr. Betters  present.   PROCEDURE:  Ayo Gehlhausen was brought to the operating room and placed in  supine position on the table.   Following an anesthesia consult with Dr. Jean Rosenthal, interscalene block was  placed without complication.  Ms. Kindler was then transferred to Room 8,  placed in supine position on the table, and following induction of general  orotracheal anesthesia, she was carefully positioned in the beach-chair  position with aid of a torso and head holder designed for shoulder  arthroscopy.   The entire left upper extremity and forequarter were prepped with DuraPrep  and draped with impervious arthroscopy drapes.   The procedure commenced with placement of the arthroscope through a standard  posterior viewing portal.  Diagnostic arthroscopy revealed moderate anterior  adhesive capsulitis granulations.  The labrum was degenerative from 3  o'clock anteriorly to 11 o'clock posteriorly.   An anterior portal was created, and with a suction shaver, the degenerative  labrum was debrided.  The anterior capsular ligaments were identified after  debridement of the granulation tissues.  The capsule ligamentous complex was  intact.  The biceps origin was intact, and the biceps tendon was intact  through the rotator interval.  There was a retracted chronic necrotic   rotator cuff tear involving the supraspinatus, infraspinatus and teres minor  with extensive posterior and medial retraction of the infraspinatus.   After photographic documentation of the tear, the arthroscope was removed,  and we proceeded directly to open reconstruction.   A 5-cm incision was fashioned from distal clavicle across the anterior  acromion.  The anterior third of deltoid was taken down in a single flap and  the coracoacromial ligament released with scissors.  An acromioplasty was  performed removing the medial lip of the [facility] joint.  The distal 15-mm  clavicle was resected with an oscillating saw.  The lateral acromion was  leveled with a hand rasp, power bur and rongeur.  The necrotic cuff tear was  visualized.  Its margins were freshened.  A Cobb elevator was used to  release the cuff on the superior and deep surfaces with release of adhesions  along the glenoid rim.  We were able to ultimately advance the cuff to an  anatomic position.  The cuff was repaired with three medial Bio-Corkscrew  Anchors creating proper medial footprint after decortication of the greater  tuberosity with a power bur.  Two McLaughlin through-bone sutures were  placed, advancing the supraspinatus and infraspinatus into an anatomic  position.  The Bio-Corkscrew Anchors were then used to place a total of 6  mattress sutures insetting the medial aspect of the repair followed by use  of the anterior 2 Bio-Corkscrews for an over-the-top repair utilizing a  lateral push-lock anchor.   A very satisfactory low profile repair was achieved.   The subacromial space and bursa was then thoroughly lavaged with sterile  saline followed by hemostasis.  The dead space created by distal clavicle  resection was repaired by repair of the anterior third of the deltoid to the  anterior trapezius muscle followed by repair of the anterior third of the deltoid across the acromion and repair of the deltoid split  laterally with  mattress suture of 0 Vicryl.  The wound was then irrigated a final time  followed by repair of the skin with subdermal suture of 3-0 Vicryl  intradermal 3-0 Prolene.  There were no apparent complications.   Ms. Broerman was placed in a compressive dressing and sling.  She was  transferred to the recovery room with stable signs.   She will be discharged in 24 hours to home to begin pendulum exercises.   For aftercare, she is provided prescriptions for Dilaudid 2 mg 1 or 2  tablets p.o. q.4-6h. p.r.n. pain 30 tablets without refill.  Also, Motrin  600 mg 1 p.o. q.6h. p.r.n. pain 30 tablets with one refill and Keflex 500 mg  one p.o. q.8h. x4 days as a prophylactic antibiotic.      Katy Fitch Sypher, M.D.  Electronically Signed     RVS/MEDQ  D:  08/17/2006  T:  08/17/2006  Job:  [id number]   cc:   Arlyce Dice, M.D.

## 2011-09-13 ENCOUNTER — Other Ambulatory Visit: Payer: Self-pay | Admitting: Oncology

## 2011-09-13 ENCOUNTER — Encounter (HOSPITAL_BASED_OUTPATIENT_CLINIC_OR_DEPARTMENT_OTHER): Payer: Medicare Other | Admitting: Oncology

## 2011-09-13 DIAGNOSIS — Z85048 Personal history of other malignant neoplasm of rectum, rectosigmoid junction, and anus: Secondary | ICD-10-CM

## 2011-09-13 DIAGNOSIS — D702 Other drug-induced agranulocytosis: Secondary | ICD-10-CM

## 2011-09-13 DIAGNOSIS — C189 Malignant neoplasm of colon, unspecified: Secondary | ICD-10-CM

## 2011-09-13 DIAGNOSIS — Z23 Encounter for immunization: Secondary | ICD-10-CM

## 2011-09-13 LAB — CEA: CEA: 0.9 ng/mL (ref 0.0–5.0)

## 2012-01-16 ENCOUNTER — Telehealth: Payer: Self-pay | Admitting: Oncology

## 2012-01-16 NOTE — Telephone Encounter (Signed)
S/w the pt and she is aware of her lab and md appts in march

## 2012-01-24 ENCOUNTER — Other Ambulatory Visit: Payer: Self-pay | Admitting: Dermatology

## 2012-03-08 ENCOUNTER — Other Ambulatory Visit (HOSPITAL_BASED_OUTPATIENT_CLINIC_OR_DEPARTMENT_OTHER): Payer: Medicare Other | Admitting: Lab

## 2012-03-08 ENCOUNTER — Ambulatory Visit (HOSPITAL_COMMUNITY)
Admission: RE | Admit: 2012-03-08 | Discharge: 2012-03-08 | Disposition: A | Discharge status: Home / Self Care | Payer: Medicare Other | Source: Ambulatory Visit | Attending: Oncology | Admitting: Oncology

## 2012-03-08 ENCOUNTER — Encounter (HOSPITAL_COMMUNITY): Payer: Self-pay

## 2012-03-08 DIAGNOSIS — C189 Malignant neoplasm of colon, unspecified: Secondary | ICD-10-CM

## 2012-03-08 DIAGNOSIS — Z9079 Acquired absence of other genital organ(s): Secondary | ICD-10-CM | POA: Insufficient documentation

## 2012-03-08 DIAGNOSIS — I700 Atherosclerosis of aorta: Secondary | ICD-10-CM | POA: Insufficient documentation

## 2012-03-08 DIAGNOSIS — C20 Malignant neoplasm of rectum: Secondary | ICD-10-CM

## 2012-03-08 DIAGNOSIS — E042 Nontoxic multinodular goiter: Secondary | ICD-10-CM | POA: Insufficient documentation

## 2012-03-08 DIAGNOSIS — Z90710 Acquired absence of both cervix and uterus: Secondary | ICD-10-CM | POA: Insufficient documentation

## 2012-03-08 HISTORY — DX: Malignant (primary) neoplasm, unspecified: C80.1

## 2012-03-08 LAB — BASIC METABOLIC PANEL - CANCER CENTER ONLY
BUN, Bld: 17 mg/dL (ref 7–22)
CO2: 30 mEq/L (ref 18–33)
Calcium: 9.6 mg/dL (ref 8.0–10.3)
Chloride: 94 mEq/L — ABNORMAL LOW (ref 98–108)
Creat: 1.1 mg/dl (ref 0.6–1.2)
Glucose, Bld: 97 mg/dL (ref 73–118)
Potassium: 4.4 mEq/L (ref 3.3–4.7)
Sodium: 139 mEq/L (ref 128–145)

## 2012-03-08 LAB — CEA: CEA: 1.3 ng/mL (ref 0.0–5.0)

## 2012-03-08 MED ORDER — IOHEXOL 300 MG/ML  SOLN
100.0000 mL | Freq: Once | INTRAMUSCULAR | Status: AC | PRN
  Administered 2012-03-08: 100 mL via INTRAVENOUS

## 2012-03-12 ENCOUNTER — Telehealth: Payer: Self-pay | Admitting: Oncology

## 2012-03-12 ENCOUNTER — Ambulatory Visit (HOSPITAL_BASED_OUTPATIENT_CLINIC_OR_DEPARTMENT_OTHER): Payer: Medicare Other | Admitting: Nurse Practitioner

## 2012-03-12 VITALS — BP 141/67 | HR 69 | Temp 97.7°F | Ht 68.0 in | Wt 158.6 lb

## 2012-03-12 DIAGNOSIS — R198 Other specified symptoms and signs involving the digestive system and abdomen: Secondary | ICD-10-CM

## 2012-03-12 DIAGNOSIS — C20 Malignant neoplasm of rectum: Secondary | ICD-10-CM

## 2012-03-12 NOTE — Telephone Encounter (Signed)
gve the pt her sept 2013 appt calendar °

## 2012-03-12 NOTE — Progress Notes (Signed)
OFFICE PROGRESS NOTE  Interval history:  Melanie [name] returns as scheduled. She reports continued erratic bowel habits. She takes Imodium as needed. She denies rectal bleeding. She overall feels well. She has a good appetite. She denies pain except related to "arthritis".   Objective: Blood pressure 141/67, pulse 69, temperature 97.7 F (36.5 C), temperature source Oral, height 5\' 8"  (1.727 m), weight 158 lb 9.6 oz (71.94 kg).  Oropharynx is without thrush or ulceration. No palpable cervical, supraclavicular, axillary or inguinal lymph nodes. Coarse breath sounds at the bases bilaterally. Regular cardiac rhythm. Abdomen soft and nontender. No organomegaly. Extremities are without edema. Calves are soft and nontender.  Lab Results: Lab Results  Component Value Date   WBC 3.9 09/16/2010   HGB 12.2 09/16/2010   HCT 35.2 09/16/2010   MCV 88.8 09/16/2010   PLT 197 09/16/2010    Chemistry:    Chemistry      Component Value Date/Time   NA 139 03/08/2012 0801   NA 134* 03/21/2010 0505   K 4.4 03/08/2012 0801   K 3.5 03/21/2010 0505   CL 94* 03/08/2012 0801   CL 100 03/21/2010 0505   CO2 30 03/08/2012 0801   CO2 30 03/21/2010 0505   BUN 17 03/08/2012 0801   BUN 8 03/21/2010 0505   CREATININE 1.1 03/08/2012 0801   CREATININE 0.71 03/21/2010 0505      Component Value Date/Time   CALCIUM 9.6 03/08/2012 0801   CALCIUM 8.7 03/21/2010 0505   ALKPHOS 58 03/10/2011 0902   ALKPHOS 89 03/16/2010 1115   AST 27 03/10/2011 0902   AST 22 03/16/2010 1115   ALT 16 03/16/2010 1115   BILITOT 0.60 03/10/2011 0902   BILITOT 0.4 03/16/2010 1115     CEA 1.3 on 03/08/2012.  Studies/Results: Ct Chest W Contrast  03/08/2012  *RADIOLOGY REPORT*  Clinical Data:  History of rectal cancer diagnosed in March 2010 status post resection and colostomy reversal.  Previous history of chemotherapy and radiation therapy.  CT CHEST, ABDOMEN AND PELVIS WITH CONTRAST  Technique:  Multidetector CT imaging of the chest, abdomen and  pelvis was performed following the standard protocol during bolus administration of intravenous contrast.  Contrast: OMNIPAQUE IOHEXOL 300 MG/ML IJ SOLN  Comparison:  CT of the abdomen and pelvis 03/10/2011.  CT CHEST  Findings:  Mediastinum: The thyroid gland is diffusely enlarged, particularly the right lobe of the gland.  Several small low attenuation nodules are noted, none of which demonstrate that over concerning characteristics. Heart size is normal. There is no significant pericardial fluid, thickening or pericardial calcification.  Mild atherosclerosis of the thoracic aorta and great vessels of the mediastinum is noted. No acute abnormality of the thoracic aorta; specifically, no aneurysm or dissection.  Esophagus is unremarkable in appearance. No pathologically enlarged mediastinal or hilar lymph nodes.  Lungs/Pleura: Mild diffuse bronchial wall thickening and mild centrilobular emphysema is again noted.  A small amount of scarring is noted in the inferior segment of the lingula and posterior aspect of the right lower lobe (unchanged).  No definite suspicious appearing pulmonary nodules or masses are identified.  No consolidative airspace disease.  No pleural effusions.  Musculoskeletal: There are no aggressive appearing lytic or blastic lesions noted in the visualized portions of the skeleton.  IMPRESSION:  1.  No findings to suggest metastatic disease in the thorax. 2.  Mild diffuse bronchial wall thickening and mild central lobular emphysema is similar to the prior study, suggesting mild COPD. 3.  Mild atherosclerosis  of the thoracic aorta and great vessels of the mediastinum. 4.  Probable goiter again noted.  CT ABDOMEN AND PELVIS  Findings:  Abdomen/Pelvis: Small focal area of fatty infiltration is again noted within segment 4B of the liver adjacent to the falciform ligament.  The remainder the liver is otherwise unremarkable in appearance.  The infused appearance of the gallbladder, pancreas,  spleen, bilateral adrenal glands and right kidney is unremarkable. In the anterior aspect of the interpolar region of the left kidney there is a subcentimeter low attenuation lesion which is too small to definitively characterize (but is unchanged, and therefore favored to represent a small cyst).  Status post low anterior resection and takedown of ileostomy.  In the low anatomic pelvis there are no abnormal soft tissue masses to suggest the presence of local recurrence of disease.  No definite pathologic adenopathy appreciated within the abdomen or pelvis. Mild atherosclerosis of the abdominal aorta and pelvic vasculature, without evidence of aneurysm.  No ascites or pneumoperitoneum, and no pathologic distension of bowel.  Urinary bladder is nearly completely decompressed.  Uterus and ovaries have been removed.  Musculoskeletal: There are no aggressive appearing lytic or blastic lesions noted in the visualized portions of the skeleton.  Grade 1 anterolisthesis of the L4 upon L5 is incidentally noted (unchanged).  IMPRESSION:  1.  No findings to suggest local recurrence of disease or new metastatic disease within the abdomen or pelvis. 2.  Probable small cyst in left kidney, unchanged. 3.  Grade 1 anterolisthesis of L4 pan L5 (unchanged).  Original Report Authenticated By: Florencia Reasons, M.D.   Ct Abdomen Pelvis W Contrast  03/08/2012  *RADIOLOGY REPORT*  Clinical Data:  History of rectal cancer diagnosed in March 2010 status post resection and colostomy reversal.  Previous history of chemotherapy and radiation therapy.  CT CHEST, ABDOMEN AND PELVIS WITH CONTRAST  Technique:  Multidetector CT imaging of the chest, abdomen and pelvis was performed following the standard protocol during bolus administration of intravenous contrast.  Contrast: OMNIPAQUE IOHEXOL 300 MG/ML IJ SOLN  Comparison:  CT of the abdomen and pelvis 03/10/2011.  CT CHEST  Findings:  Mediastinum: The thyroid gland is diffusely  enlarged, particularly the right lobe of the gland.  Several small low attenuation nodules are noted, none of which demonstrate that over concerning characteristics. Heart size is normal. There is no significant pericardial fluid, thickening or pericardial calcification.  Mild atherosclerosis of the thoracic aorta and great vessels of the mediastinum is noted. No acute abnormality of the thoracic aorta; specifically, no aneurysm or dissection.  Esophagus is unremarkable in appearance. No pathologically enlarged mediastinal or hilar lymph nodes.  Lungs/Pleura: Mild diffuse bronchial wall thickening and mild centrilobular emphysema is again noted.  A small amount of scarring is noted in the inferior segment of the lingula and posterior aspect of the right lower lobe (unchanged).  No definite suspicious appearing pulmonary nodules or masses are identified.  No consolidative airspace disease.  No pleural effusions.  Musculoskeletal: There are no aggressive appearing lytic or blastic lesions noted in the visualized portions of the skeleton.  IMPRESSION:  1.  No findings to suggest metastatic disease in the thorax. 2.  Mild diffuse bronchial wall thickening and mild central lobular emphysema is similar to the prior study, suggesting mild COPD. 3.  Mild atherosclerosis of the thoracic aorta and great vessels of the mediastinum. 4.  Probable goiter again noted.  CT ABDOMEN AND PELVIS  Findings:  Abdomen/Pelvis: Small focal area of fatty  infiltration is again noted within segment 4B of the liver adjacent to the falciform ligament.  The remainder the liver is otherwise unremarkable in appearance.  The infused appearance of the gallbladder, pancreas, spleen, bilateral adrenal glands and right kidney is unremarkable. In the anterior aspect of the interpolar region of the left kidney there is a subcentimeter low attenuation lesion which is too small to definitively characterize (but is unchanged, and therefore favored to  represent a small cyst).  Status post low anterior resection and takedown of ileostomy.  In the low anatomic pelvis there are no abnormal soft tissue masses to suggest the presence of local recurrence of disease.  No definite pathologic adenopathy appreciated within the abdomen or pelvis. Mild atherosclerosis of the abdominal aorta and pelvic vasculature, without evidence of aneurysm.  No ascites or pneumoperitoneum, and no pathologic distension of bowel.  Urinary bladder is nearly completely decompressed.  Uterus and ovaries have been removed.  Musculoskeletal: There are no aggressive appearing lytic or blastic lesions noted in the visualized portions of the skeleton.  Grade 1 anterolisthesis of the L4 upon L5 is incidentally noted (unchanged).  IMPRESSION:  1.  No findings to suggest local recurrence of disease or new metastatic disease within the abdomen or pelvis. 2.  Probable small cyst in left kidney, unchanged. 3.  Grade 1 anterolisthesis of L4 pan L5 (unchanged).  Original Report Authenticated By: Florencia Reasons, M.D.    Medications: I have reviewed the patient's current medications.  Assessment/Plan:  1. Pathologic stage III (pT3 pN1) rectal cancer, status post neoadjuvant Xeloda and concurrent radiation completed 05/28/2009.  She is status post a low anterior resection and temporary ileostomy 07/16/2009.  She completed 10 cycles of adjuvant FOLFOX chemotherapy 08/26/2009 through 01/27/2010.  Oxaliplatin was held with cycles #5, 7, and 10.  A restaging CT evaluation on 03/10/2011 showed no evidence of metastatic disease. Restaging CT evaluation 03/08/2012 negative for evidence of metastatic disease. 2. Status post ileostomy reversal March 2011. 3. Postoperative wound infection 2010. 4. History of thrombocytopenia secondary to chemotherapy. 5. Emphysema on a CT 03/12/2009. 6. Hypercholesterolemia. 7. Hypothyroidism. 8. History of diarrhea secondary to Xeloda and radiation. 9. Delayed  nausea following cycle #1 of FOLFOX, improved with the addition of Aloxi. 10. History of hand-foot syndrome secondary to Xeloda. 11. History of ocular symptoms with itching, burning, and tearing, question 5-FU toxicity. 12. Bone pain following Neulasta. 13. History of a mildly elevated creatinine.  The creatinine was normal 01/27/2010 and 03/10/2011. 14. History of mild hand-foot syndrome secondary to 5-FU. 15. Question pneumonitis versus infectious changes in the upper lungs on a CT 03/08/2010.  The CT 03/10/2011 showed resolution of previously-described subtle upper lobe nodularity possibly representing hypersensitivity pneumonitis or respiratory bronchiolitis.  Disposition-Ms. Avitabile appears stable. She remains in remission from the rectal cancer. She will return for a followup visit and CEA in 6 months. She will contact the office in the interim with any problems.  Plan reviewed with Dr. Truett Perna.  Lonna Cobb ANP/GNP-BC

## 2012-09-07 ENCOUNTER — Other Ambulatory Visit (HOSPITAL_BASED_OUTPATIENT_CLINIC_OR_DEPARTMENT_OTHER): Payer: Medicare Other

## 2012-09-07 DIAGNOSIS — C20 Malignant neoplasm of rectum: Secondary | ICD-10-CM

## 2012-09-07 LAB — CEA: CEA: 0.6 ng/mL (ref 0.0–5.0)

## 2012-09-14 ENCOUNTER — Ambulatory Visit: Payer: Medicare Other | Admitting: Oncology

## 2012-09-14 ENCOUNTER — Ambulatory Visit (HOSPITAL_BASED_OUTPATIENT_CLINIC_OR_DEPARTMENT_OTHER): Payer: Medicare Other | Admitting: Oncology

## 2012-09-14 ENCOUNTER — Telehealth: Payer: Self-pay | Admitting: Oncology

## 2012-09-14 VITALS — BP 144/67 | HR 75 | Temp 98.0°F | Resp 20 | Ht 68.0 in | Wt 158.3 lb

## 2012-09-14 DIAGNOSIS — C20 Malignant neoplasm of rectum: Secondary | ICD-10-CM

## 2012-09-14 DIAGNOSIS — E039 Hypothyroidism, unspecified: Secondary | ICD-10-CM

## 2012-09-14 NOTE — Progress Notes (Signed)
   [facility]    OFFICE PROGRESS NOTE   INTERVAL HISTORY:   She returns as scheduled. The bowel movements are less frequent. She had transient erythema and discomfort at one of the left toes yesterday. This has resolved. No other complaint.  Objective:  Vital signs in last 24 hours:  Blood pressure 144/67, pulse 75, temperature 98 F (36.7 C), resp. rate 20, height 5\' 8"  (1.727 m), weight 158 lb 4.8 oz (71.804 kg).    HEENT: Neck without mass Lymphatics: No cervical, supraclavicular, axillary, or inguinal nodes Resp: Few rhonchi at the lower chest bilaterally Cardio: Regular rate and rhythm GI: No hepatosplenomegaly, no mass Vascular: No leg edema   Lab Results:  CEA 0.6 on 09/07/2012   Medications: I have reviewed the patient's current medications.  Assessment/Plan: 1. Pathologic stage III (pT3 pN1) rectal cancer, status post neoadjuvant Xeloda and concurrent radiation completed 05/28/2009. She is status post a low anterior resection and temporary ileostomy 07/16/2009. She completed 10 cycles of adjuvant FOLFOX chemotherapy 08/26/2009 through 01/27/2010. Oxaliplatin was held with cycles #5, 7, and 10. A restaging CT evaluation on 03/10/2011 showed no evidence of metastatic disease. Restaging CT evaluation 03/08/2012 negative for evidence of metastatic disease. 2. Status post ileostomy reversal March 2011. 3. Postoperative wound infection 2010. 4. History of thrombocytopenia secondary to chemotherapy. 5. Emphysema on a CT 03/12/2009. 6. Hypercholesterolemia. 7. Hypothyroidism. 8. History of diarrhea secondary to Xeloda and radiation. 9. Delayed nausea following cycle #1 of FOLFOX, improved with the addition of Aloxi. 10. History of hand-foot syndrome secondary to Xeloda. 11. History of ocular symptoms with itching, burning, and tearing, question 5-FU toxicity. 12. Bone pain following Neulasta. 13. History of a mildly elevated creatinine. The creatinine  was normal 01/27/2010 and 03/10/2011. 14. History of mild hand-foot syndrome secondary to 5-FU. 15. Question pneumonitis versus infectious changes in the upper lungs on a CT 03/08/2010. The CT 03/10/2011 showed resolution of previously-described subtle upper lobe nodularity possibly representing hypersensitivity pneumonitis or respiratory bronchiolitis.    Disposition:  She remains in clinical remission from rectal cancer. She will return for an office visit and CEA in 6 months.   Thornton Papas, MD  09/14/2012  8:53 PM

## 2012-09-14 NOTE — Telephone Encounter (Signed)
gve the pt her march 2014 appt calendar °

## 2013-03-12 ENCOUNTER — Other Ambulatory Visit: Payer: Medicare Other | Admitting: Lab

## 2013-03-13 ENCOUNTER — Other Ambulatory Visit (HOSPITAL_BASED_OUTPATIENT_CLINIC_OR_DEPARTMENT_OTHER): Payer: Medicare Other | Admitting: Lab

## 2013-03-13 ENCOUNTER — Telehealth: Payer: Self-pay | Admitting: *Deleted

## 2013-03-13 DIAGNOSIS — C20 Malignant neoplasm of rectum: Secondary | ICD-10-CM

## 2013-03-13 LAB — CEA: CEA: 0.6 ng/mL (ref 0.0–5.0)

## 2013-03-13 NOTE — Telephone Encounter (Signed)
Left VM at phone # given that lab has been scheduled for today at 3pm.

## 2013-03-13 NOTE — Telephone Encounter (Signed)
Left VM requesting new lab appointment to be made prior to her 3/21 OV.

## 2013-03-14 ENCOUNTER — Other Ambulatory Visit: Payer: Self-pay

## 2013-03-14 DIAGNOSIS — Z1231 Encounter for screening mammogram for malignant neoplasm of breast: Secondary | ICD-10-CM

## 2013-03-15 ENCOUNTER — Ambulatory Visit: Payer: Medicare Other | Admitting: Oncology

## 2013-03-15 ENCOUNTER — Ambulatory Visit (HOSPITAL_BASED_OUTPATIENT_CLINIC_OR_DEPARTMENT_OTHER): Payer: Medicare Other | Admitting: Nurse Practitioner

## 2013-03-15 ENCOUNTER — Telehealth: Payer: Self-pay | Admitting: Oncology

## 2013-03-15 VITALS — BP 141/60 | HR 75 | Temp 98.2°F | Resp 18 | Ht 68.0 in | Wt 160.0 lb

## 2013-03-15 DIAGNOSIS — C20 Malignant neoplasm of rectum: Secondary | ICD-10-CM

## 2013-03-15 NOTE — Progress Notes (Signed)
OFFICE PROGRESS NOTE  Interval history:  Melanie [name] returns as scheduled. She continues to have erratic bowel habits. No hematochezia or melena. She has a good appetite. No nausea or vomiting. No abdominal pain. No interim illnesses or infections.   Objective: Blood pressure 141/60, pulse 75, temperature 98.2 F (36.8 C), temperature source Oral, resp. rate 18, height 5\' 8"  (1.727 m), weight 160 lb (72.576 kg).  Oropharynx is without thrush or ulceration. No palpable cervical, supraclavicular, axillary or inguinal lymph nodes. Faint rhonchi at the lower lung fields bilaterally. Regular cardiac rhythm. Abdomen soft and nontender. No organomegaly. Extremities are without edema.  Lab Results: Lab Results  Component Value Date   WBC 3.9 09/16/2010   HGB 12.2 09/16/2010   HCT 35.2 09/16/2010   MCV 88.8 09/16/2010   PLT 197 09/16/2010    Chemistry:    Chemistry      Component Value Date/Time   NA 139 03/08/2012 0801   NA 134* 03/21/2010 0505   K 4.4 03/08/2012 0801   K 3.5 03/21/2010 0505   CL 94* 03/08/2012 0801   CL 100 03/21/2010 0505   CO2 30 03/08/2012 0801   CO2 30 03/21/2010 0505   BUN 17 03/08/2012 0801   BUN 8 03/21/2010 0505   CREATININE 1.1 03/08/2012 0801   CREATININE 0.71 03/21/2010 0505      Component Value Date/Time   CALCIUM 9.6 03/08/2012 0801   CALCIUM 8.7 03/21/2010 0505   ALKPHOS 58 03/10/2011 0902   ALKPHOS 89 03/16/2010 1115   AST 27 03/10/2011 0902   AST 22 03/16/2010 1115   ALT 16 03/16/2010 1115   BILITOT 0.60 03/10/2011 0902   BILITOT 0.4 03/16/2010 1115     03/13/2013 CEA 0.6.  Studies/Results: No results found.  Medications: I have reviewed the patient's current medications.  Assessment/Plan:  1. Pathologic stage III (pT3 pN1) rectal cancer, status post neoadjuvant Xeloda and concurrent radiation completed 05/28/2009. She is status post a low anterior resection and temporary ileostomy 07/16/2009. She completed 10 cycles of adjuvant FOLFOX chemotherapy  08/26/2009 through 01/27/2010. Oxaliplatin was held with cycles #5, 7, and 10. A restaging CT evaluation on 03/10/2011 showed no evidence of metastatic disease. Restaging CT evaluation 03/08/2012 negative for evidence of metastatic disease. 2. Status post ileostomy reversal March 2011. 3. Postoperative wound infection 2010. 4. History of thrombocytopenia secondary to chemotherapy. 5. Emphysema on a CT 03/12/2009. 6. Hypercholesterolemia. 7. Hypothyroidism. 8. History of diarrhea secondary to Xeloda and radiation. 9. Delayed nausea following cycle #1 of FOLFOX, improved with the addition of Aloxi. 10. History of hand-foot syndrome secondary to Xeloda. 11. History of ocular symptoms with itching, burning, and tearing, question 5-FU toxicity. 12. Bone pain following Neulasta. 13. History of a mildly elevated creatinine. The creatinine was normal 01/27/2010 and 03/10/2011. 14. History of mild hand-foot syndrome secondary to 5-FU. 15. Question pneumonitis versus infectious changes in the upper lungs on a CT 03/08/2010. The CT 03/10/2011 showed resolution of previously-described subtle upper lobe nodularity possibly representing hypersensitivity pneumonitis or respiratory bronchiolitis.  Disposition-Melanie [name] remains in clinical remission from rectal cancer. She will return for a followup visit and CEA in 6 months.  Lonna Cobb ANP/GNP-BC

## 2013-03-15 NOTE — Telephone Encounter (Signed)
gv and printed appt schedule for pt for Sept °

## 2013-04-16 ENCOUNTER — Ambulatory Visit
Admission: RE | Admit: 2013-04-16 | Discharge: 2013-04-16 | Disposition: A | Discharge status: Home / Self Care | Payer: Medicare Other | Source: Ambulatory Visit

## 2013-04-16 DIAGNOSIS — Z1231 Encounter for screening mammogram for malignant neoplasm of breast: Secondary | ICD-10-CM

## 2013-09-16 ENCOUNTER — Other Ambulatory Visit (HOSPITAL_BASED_OUTPATIENT_CLINIC_OR_DEPARTMENT_OTHER): Payer: Medicare Other

## 2013-09-16 DIAGNOSIS — C20 Malignant neoplasm of rectum: Secondary | ICD-10-CM

## 2013-09-17 LAB — CEA: CEA: 0.6 ng/mL (ref 0.0–5.0)

## 2013-09-19 ENCOUNTER — Telehealth: Payer: Self-pay | Admitting: Oncology

## 2013-09-19 ENCOUNTER — Ambulatory Visit (HOSPITAL_BASED_OUTPATIENT_CLINIC_OR_DEPARTMENT_OTHER): Payer: Medicare Other | Admitting: Oncology

## 2013-09-19 VITALS — BP 141/56 | HR 74 | Temp 97.8°F | Resp 18 | Ht 68.0 in | Wt 161.1 lb

## 2013-09-19 DIAGNOSIS — Z23 Encounter for immunization: Secondary | ICD-10-CM

## 2013-09-19 DIAGNOSIS — C20 Malignant neoplasm of rectum: Secondary | ICD-10-CM

## 2013-09-19 MED ORDER — INFLUENZA VAC SPLIT QUAD 0.5 ML IM SUSP
0.5000 mL | Freq: Once | INTRAMUSCULAR | Status: AC
  Administered 2013-09-19: 0.5 mL via INTRAMUSCULAR
  Filled 2013-09-19: qty 0.5

## 2013-09-19 NOTE — Progress Notes (Signed)
   [facility]    OFFICE PROGRESS NOTE   INTERVAL HISTORY:   She returns for scheduled followup of rectal cancer. She feels well. Good appetite. She continues to have irregular bowel habits. On some days she has multiple bowel movements. No other complaint. She last underwent a colonoscopy by Dr. Kinnie Scales in April of 2012.  Objective:  Vital signs in last 24 hours:  Blood pressure 141/56, pulse 74, temperature 97.8 F (36.6 C), temperature source Oral, resp. rate 18, height 5\' 8"  (1.727 m), weight 161 lb 1.6 oz (73.074 kg).    HEENT: Neck without mass Lymphatics: No cervical, supraclavicular, axillary, or inguinal nodes Resp: Scattered end inspiratory coarse rhonchi, no respiratory distress Cardio: Regular rate and rhythm GI: No hepatosplenomegaly, nontender, no mass Vascular: No leg edema  Portacath/PICC-without erythema  Lab Results:  CEA on 09/16/2013-0.6   Medications: I have reviewed the patient's current medications.  Assessment/Plan: 1. Pathologic stage III (pT3 pN1) rectal cancer, status post neoadjuvant Xeloda and concurrent radiation completed 05/28/2009. She is status post a low anterior resection and temporary ileostomy 07/16/2009. She completed 10 cycles of adjuvant FOLFOX chemotherapy 08/26/2009 through 01/27/2010. Oxaliplatin was held with cycles #5, 7, and 10. A restaging CT evaluation on 03/10/2011 showed no evidence of metastatic disease. Restaging CT evaluation 03/08/2012 negative for evidence of metastatic disease. Last surveillance colonoscopy April of 2012. 2. Status post ileostomy reversal March 2011. 3. Postoperative wound infection 2010. 4. History of thrombocytopenia secondary to chemotherapy. 5. Emphysema on a CT 03/12/2009. 6. Hypercholesterolemia. 7. Hypothyroidism. 8. History of diarrhea secondary to Xeloda and radiation. 9. Delayed nausea following cycle #1 of FOLFOX, improved with the addition of Aloxi. 10. History of hand-foot  syndrome secondary to Xeloda. 11. History of ocular symptoms with itching, burning, and tearing, question 5-FU toxicity. 12. Bone pain following Neulasta. 13. History of a mildly elevated creatinine. The creatinine was normal 01/27/2010 and 03/10/2011. 14. History of mild hand-foot syndrome secondary to 5-FU. 15. Question pneumonitis versus infectious changes in the upper lungs on a CT 03/08/2010. The CT 03/10/2011 showed resolution of previously-described subtle upper lobe nodularity possibly representing hypersensitivity pneumonitis or respiratory bronchiolitis  Disposition:  Ms. Melanie [name] remains in clinical remission from rectal cancer. She will return for an office visit and CEA in 9 months. She received an influenza vaccine today.   Thornton Papas, MD  09/19/2013  6:07 PM

## 2013-09-19 NOTE — Telephone Encounter (Signed)
gv adn printed appt sched and avs for pt for June 2015...Marland Kitchenpt requested morning

## 2013-09-24 ENCOUNTER — Telehealth: Payer: Self-pay | Admitting: *Deleted

## 2013-09-24 NOTE — Telephone Encounter (Signed)
Message copied by Caleb Popp on Tue Sep 24, 2013 10:55 AM ------      Message from: Ladene Artist      Created: Fri Sep 20, 2013  5:28 PM       Please call patient, cea is normal ------

## 2013-09-25 ENCOUNTER — Telehealth: Payer: Self-pay | Admitting: *Deleted

## 2013-09-25 NOTE — Telephone Encounter (Signed)
Patient notified of results.

## 2013-09-25 NOTE — Telephone Encounter (Signed)
Message copied by Wandalee Ferdinand on Wed Sep 25, 2013  3:11 PM ------      Message from: Ladene Artist      Created: Fri Sep 20, 2013  5:28 PM       Please call patient, cea is normal ------

## 2014-05-26 ENCOUNTER — Other Ambulatory Visit: Payer: Self-pay

## 2014-05-26 DIAGNOSIS — Z1231 Encounter for screening mammogram for malignant neoplasm of breast: Secondary | ICD-10-CM

## 2014-06-12 ENCOUNTER — Other Ambulatory Visit (HOSPITAL_BASED_OUTPATIENT_CLINIC_OR_DEPARTMENT_OTHER): Payer: Medicare Other

## 2014-06-12 ENCOUNTER — Ambulatory Visit (HOSPITAL_BASED_OUTPATIENT_CLINIC_OR_DEPARTMENT_OTHER): Payer: Medicare Other | Admitting: Nurse Practitioner

## 2014-06-12 VITALS — BP 147/63 | HR 80 | Temp 97.9°F | Resp 18 | Ht 68.0 in | Wt 156.4 lb

## 2014-06-12 DIAGNOSIS — C20 Malignant neoplasm of rectum: Secondary | ICD-10-CM

## 2014-06-12 DIAGNOSIS — Z85048 Personal history of other malignant neoplasm of rectum, rectosigmoid junction, and anus: Secondary | ICD-10-CM

## 2014-06-12 LAB — CEA: CEA: 0.9 ng/mL (ref 0.0–5.0)

## 2014-06-12 NOTE — Progress Notes (Signed)
  [place] OFFICE PROGRESS NOTE   Diagnosis:  Rectal cancer.  INTERVAL HISTORY:   Melanie [name] returns as scheduled. She overall feels well. She has a good appetite. No weight loss. She denies abdominal pain. Bowel habits continue to be erratic. No bloody or black stools. She reports her last colonoscopy was in 2012 with the next one to be done at a 5 year interval. She denies shortness of breath. She has an occasional cough which she thinks is due to allergies. No fevers. No nausea or vomiting. Over the past 2 weeks she has had a "catch" at the lower back. She thinks that she strained a muscle. She denies any other pain. No urinary symptoms. She reports she has been diagnosed with psoriasis.  Objective:  Vital signs in last 24 hours:  Blood pressure 147/63, pulse 80, temperature 97.9 F (36.6 C), temperature source Oral, resp. rate 18, height 5\' 8"  (1.727 m), weight 156 lb 6.4 oz (70.943 kg), SpO2 98.00%.    HEENT: No thrush or ulcerations. Lymphatics: No palpable cervical, supraclavicular, axillary or inguinal lymph nodes. Resp: Lungs clear. Cardio: Regular cardiac rhythm. GI: Abdomen soft and nontender. No organomegaly. No mass. Vascular: No leg edema.     Lab Results:  Lab Results  Component Value Date   WBC 3.9 09/16/2010   HGB 12.2 09/16/2010   HCT 35.2 09/16/2010   MCV 88.8 09/16/2010   PLT 197 09/16/2010   NEUTROABS 2.6 09/16/2010    Imaging:  No results found.  Medications: I have reviewed the patient's current medications.  Assessment/Plan: 1. Pathologic stage III (pT3 pN1) rectal cancer, status post neoadjuvant Xeloda and concurrent radiation completed 05/28/2009. She is status post a low anterior resection and temporary ileostomy 07/16/2009. She completed 10 cycles of adjuvant FOLFOX chemotherapy 08/26/2009 through 01/27/2010. Oxaliplatin was held with cycles #5, 7, and 10. A restaging CT evaluation on 03/10/2011 showed no evidence of metastatic  disease. Restaging CT evaluation 03/08/2012 negative for evidence of metastatic disease. Last surveillance colonoscopy April of 2012. 2. Status post ileostomy reversal March 2011. 3. Postoperative wound infection 2010. 4. History of thrombocytopenia secondary to chemotherapy. 5. Emphysema on a CT 03/12/2009. 6. Hypercholesterolemia. 7. Hypothyroidism. She is on Synthroid. 8. History of diarrhea secondary to Xeloda and radiation. 9. Delayed nausea following cycle #1 of FOLFOX, improved with the addition of Aloxi. 10. History of hand-foot syndrome secondary to Xeloda. 11. History of ocular symptoms with itching, burning, and tearing, question 5-FU toxicity. 12. Bone pain following Neulasta. 13. History of a mildly elevated creatinine. The creatinine was normal 01/27/2010 and 03/10/2011. 14. History of mild hand-foot syndrome secondary to 5-FU. 15. Question pneumonitis versus infectious changes in the upper lungs on a CT 03/08/2010. The CT 03/10/2011 showed resolution of previously-described subtle upper lobe nodularity possibly representing hypersensitivity pneumonitis or respiratory bronchiolitis.   Disposition: Melanie [name] appears stable. She is now greater than 5 years out from the rectal cancer diagnosis. She remains in clinical remission. We will followup on the CEA from today. She will return for a followup visit and CEA in one year.  Plan reviewed with Dr. Benay Spice.    Ned Card ANP/GNP-BC   06/12/2014  10:00 AM

## 2014-06-16 ENCOUNTER — Telehealth: Payer: Self-pay | Admitting: *Deleted

## 2014-06-16 NOTE — Telephone Encounter (Signed)
Message copied by Norma Fredrickson on Mon Jun 16, 2014  9:10 AM ------      Message from: Ladell Pier      Created: Sun Jun 15, 2014  5:56 PM       Please call patient, cea is normal ------

## 2014-06-16 NOTE — Telephone Encounter (Signed)
Called and informed patient of normal cea.  Per Dr. Sherrill.  Patient verbalized understanding.  

## 2014-06-16 NOTE — Telephone Encounter (Signed)
Left Message for patient to call back regarding lab results.   

## 2014-06-30 ENCOUNTER — Other Ambulatory Visit: Payer: Self-pay | Admitting: Family Medicine

## 2014-06-30 ENCOUNTER — Ambulatory Visit
Admission: RE | Admit: 2014-06-30 | Discharge: 2014-06-30 | Disposition: A | Discharge status: Home / Self Care | Payer: Medicare Other | Source: Ambulatory Visit | Attending: Family Medicine | Admitting: Family Medicine

## 2014-06-30 DIAGNOSIS — M549 Dorsalgia, unspecified: Secondary | ICD-10-CM

## 2014-07-08 ENCOUNTER — Ambulatory Visit
Admission: RE | Admit: 2014-07-08 | Discharge: 2014-07-08 | Disposition: A | Discharge status: Home / Self Care | Payer: Medicare Other | Source: Ambulatory Visit

## 2014-07-08 DIAGNOSIS — Z1231 Encounter for screening mammogram for malignant neoplasm of breast: Secondary | ICD-10-CM

## 2014-10-08 ENCOUNTER — Other Ambulatory Visit: Payer: Self-pay | Admitting: Orthopedic Surgery

## 2014-10-08 DIAGNOSIS — M5416 Radiculopathy, lumbar region: Secondary | ICD-10-CM

## 2014-10-08 DIAGNOSIS — S32050A Wedge compression fracture of fifth lumbar vertebra, initial encounter for closed fracture: Secondary | ICD-10-CM

## 2014-10-09 ENCOUNTER — Ambulatory Visit
Admission: RE | Admit: 2014-10-09 | Discharge: 2014-10-09 | Disposition: A | Discharge status: Home / Self Care | Payer: Medicare Other | Source: Ambulatory Visit | Attending: Orthopedic Surgery | Admitting: Orthopedic Surgery

## 2014-10-09 DIAGNOSIS — S32050A Wedge compression fracture of fifth lumbar vertebra, initial encounter for closed fracture: Secondary | ICD-10-CM

## 2014-10-09 DIAGNOSIS — M5416 Radiculopathy, lumbar region: Secondary | ICD-10-CM

## 2015-06-19 ENCOUNTER — Telehealth: Payer: Self-pay | Admitting: Oncology

## 2015-06-19 ENCOUNTER — Ambulatory Visit (HOSPITAL_BASED_OUTPATIENT_CLINIC_OR_DEPARTMENT_OTHER): Payer: Medicare Other | Admitting: Oncology

## 2015-06-19 ENCOUNTER — Other Ambulatory Visit (HOSPITAL_BASED_OUTPATIENT_CLINIC_OR_DEPARTMENT_OTHER): Payer: Medicare Other

## 2015-06-19 VITALS — BP 137/56 | HR 76 | Temp 98.4°F | Resp 18 | Ht 68.0 in | Wt 148.6 lb

## 2015-06-19 DIAGNOSIS — Z85048 Personal history of other malignant neoplasm of rectum, rectosigmoid junction, and anus: Secondary | ICD-10-CM | POA: Diagnosis not present

## 2015-06-19 DIAGNOSIS — C20 Malignant neoplasm of rectum: Secondary | ICD-10-CM

## 2015-06-19 NOTE — Progress Notes (Signed)
  [place] OFFICE PROGRESS NOTE   Diagnosis: Rectal cancer  INTERVAL HISTORY:   Melanie [name] returns as scheduled. She was diagnosed with an L5 compression fracture last year. She continues to have irregular bowel habits. She reports a good appetite.  Objective:  Vital signs in last 24 hours:  Blood pressure 137/56, pulse 76, temperature 98.4 F (36.9 C), temperature source Oral, resp. rate 18, height 5\' 8"  (1.727 m), weight 148 lb 9.6 oz (67.405 kg), SpO2 97 %.    HEENT: Neck without mass, enlargement of the thyroid Lymphatics: No cervical, supra-clavicular, axillary, or inguinal nodes Resp: End inspiratory coarse rhonchi at the right greater than left posterior base, no respiratory distress Cardio: Regular rate and rhythm GI: No hepatomegaly, nontender Vascular: No leg edema   Lab Results:   Lab Results  Component Value Date   CEA 0.9 06/12/2014    Medications: I have reviewed the patient's current medications.  Assessment/Plan: 1. Pathologic stage III (pT3 pN1) rectal cancer, status post neoadjuvant Xeloda and concurrent radiation completed 05/28/2009. She is status post a low anterior resection and temporary ileostomy 07/16/2009. She completed 10 cycles of adjuvant FOLFOX chemotherapy 08/26/2009 through 01/27/2010. Oxaliplatin was held with cycles #5, 7, and 10. A restaging CT evaluation on 03/10/2011 showed no evidence of metastatic disease. Restaging CT evaluation 03/08/2012 negative for evidence of metastatic disease. Last surveillance colonoscopy April of 2012. 2. Status post ileostomy reversal March 2011. 3. Postoperative wound infection 2010. 4. History of thrombocytopenia secondary to chemotherapy. 5. Emphysema on a CT 03/12/2009. 6. Hypercholesterolemia. 7. Hypothyroidism. She is on Synthroid. 8. History of diarrhea secondary to Xeloda and radiation. 9. Delayed nausea following cycle #1 of FOLFOX, improved with the addition of  Aloxi. 10. History of hand-foot syndrome secondary to Xeloda. 11. History of ocular symptoms with itching, burning, and tearing, question 5-FU toxicity. 12. Bone pain following Neulasta. 13. History of a mildly elevated creatinine. The creatinine was normal 01/27/2010 and 03/10/2011. 14. History of mild hand-foot syndrome secondary to 5-FU. 15. Question pneumonitis versus infectious changes in the upper lungs on a CT 03/08/2010. The CT 03/10/2011 showed resolution of previously-described subtle upper lobe nodularity possibly representing hypersensitivity pneumonitis or respiratory bronchiolitis.  Disposition:  Melanie [name] remains in clinical remission from rectal cancer. She declines a referral to the pelvic physical therapy clinic. She will schedule a follow-up colonoscopy with Dr. Earlean Shawl. She would like to continue follow-up at the [facility]. Melanie [name] will return for an office visit and CEA in one year.  Betsy Coder, MD  06/19/2015  9:35 AM

## 2015-06-19 NOTE — Telephone Encounter (Signed)
per pof to sch pt appt-gave pt copy of avs °

## 2015-06-22 LAB — CEA: CEA: 0.6 ng/mL (ref 0.0–5.0)

## 2015-06-23 ENCOUNTER — Telehealth: Payer: Self-pay | Admitting: *Deleted

## 2015-06-23 NOTE — Telephone Encounter (Signed)
-----   Message from Ladell Pier, MD sent at 06/22/2015  6:38 PM EDT ----- Please call patient, cea is normal

## 2015-06-23 NOTE — Telephone Encounter (Signed)
Pt made aware of CEA results; has upcoming appts. Appreciated call. No concerns voiced at this time.

## 2016-03-04 DIAGNOSIS — M79644 Pain in right finger(s): Secondary | ICD-10-CM | POA: Diagnosis not present

## 2016-03-04 DIAGNOSIS — L920 Granuloma annulare: Secondary | ICD-10-CM | POA: Diagnosis not present

## 2016-03-04 DIAGNOSIS — L400 Psoriasis vulgaris: Secondary | ICD-10-CM | POA: Diagnosis not present

## 2016-03-04 DIAGNOSIS — F325 Major depressive disorder, single episode, in full remission: Secondary | ICD-10-CM | POA: Diagnosis not present

## 2016-03-04 DIAGNOSIS — M129 Arthropathy, unspecified: Secondary | ICD-10-CM | POA: Diagnosis not present

## 2016-03-04 DIAGNOSIS — I10 Essential (primary) hypertension: Secondary | ICD-10-CM | POA: Diagnosis not present

## 2016-03-21 DIAGNOSIS — I10 Essential (primary) hypertension: Secondary | ICD-10-CM | POA: Diagnosis not present

## 2016-03-21 DIAGNOSIS — R05 Cough: Secondary | ICD-10-CM | POA: Diagnosis not present

## 2016-03-28 ENCOUNTER — Telehealth: Payer: Self-pay | Admitting: Family Medicine

## 2016-03-28 NOTE — Telephone Encounter (Signed)
Pt would like to know if she can establish in June, but later on in the morning. Your first available is 8 am and that is a little early for her husband who must bring her. Pt's husband sees dr Elease Hashimoto. Thank you.

## 2016-03-28 NOTE — Telephone Encounter (Signed)
She can establish but please avoid scheduling back to back establish visits. An appointment at 1pm after lunch will work well for establishing care for her. Thank you

## 2016-04-05 DIAGNOSIS — H25093 Other age-related incipient cataract, bilateral: Secondary | ICD-10-CM | POA: Diagnosis not present

## 2016-04-05 DIAGNOSIS — H52221 Regular astigmatism, right eye: Secondary | ICD-10-CM | POA: Diagnosis not present

## 2016-04-05 DIAGNOSIS — I10 Essential (primary) hypertension: Secondary | ICD-10-CM | POA: Diagnosis not present

## 2016-04-05 DIAGNOSIS — H5203 Hypermetropia, bilateral: Secondary | ICD-10-CM | POA: Diagnosis not present

## 2016-04-05 DIAGNOSIS — H524 Presbyopia: Secondary | ICD-10-CM | POA: Diagnosis not present

## 2016-04-14 ENCOUNTER — Encounter: Payer: Self-pay | Admitting: Family Medicine

## 2016-04-14 ENCOUNTER — Telehealth: Payer: Self-pay

## 2016-04-14 ENCOUNTER — Ambulatory Visit (INDEPENDENT_AMBULATORY_CARE_PROVIDER_SITE_OTHER): Payer: Medicare Other | Admitting: Family Medicine

## 2016-04-14 VITALS — BP 144/68 | HR 72 | Temp 98.3°F | Resp 12 | Ht 68.0 in | Wt 145.0 lb

## 2016-04-14 DIAGNOSIS — E049 Nontoxic goiter, unspecified: Secondary | ICD-10-CM

## 2016-04-14 DIAGNOSIS — G4700 Insomnia, unspecified: Secondary | ICD-10-CM

## 2016-04-14 DIAGNOSIS — F419 Anxiety disorder, unspecified: Secondary | ICD-10-CM

## 2016-04-14 DIAGNOSIS — E039 Hypothyroidism, unspecified: Secondary | ICD-10-CM

## 2016-04-14 DIAGNOSIS — E042 Nontoxic multinodular goiter: Secondary | ICD-10-CM | POA: Insufficient documentation

## 2016-04-14 DIAGNOSIS — K219 Gastro-esophageal reflux disease without esophagitis: Secondary | ICD-10-CM

## 2016-04-14 DIAGNOSIS — N3281 Overactive bladder: Secondary | ICD-10-CM | POA: Diagnosis not present

## 2016-04-14 DIAGNOSIS — I10 Essential (primary) hypertension: Secondary | ICD-10-CM

## 2016-04-14 DIAGNOSIS — E7800 Pure hypercholesterolemia, unspecified: Secondary | ICD-10-CM | POA: Insufficient documentation

## 2016-04-14 LAB — URINALYSIS, MICROSCOPIC ONLY
RBC / HPF: NONE SEEN (ref 0–?)
WBC, UA: NONE SEEN (ref 0–?)

## 2016-04-14 LAB — TSH: TSH: 0.48 u[IU]/mL (ref 0.35–4.50)

## 2016-04-14 LAB — T4, FREE: Free T4: 1 ng/dL (ref 0.60–1.60)

## 2016-04-14 MED ORDER — LOSARTAN POTASSIUM 25 MG PO TABS: 25.0000 mg | ORAL_TABLET | Freq: Every day | ORAL | Status: DC

## 2016-04-14 NOTE — Telephone Encounter (Signed)
-----   Message from Betty G Martinique, MD sent at 04/14/2016  4:42 PM EDT ----- Thyroid function test and urine otherwise normal. Thanks.

## 2016-04-14 NOTE — Telephone Encounter (Signed)
Patient aware of results and recommendations. °

## 2016-04-14 NOTE — Patient Instructions (Signed)
A few things to remember from today's visit:   1. Essential hypertension  - COMPLETE METABOLIC PANEL WITH GFR - losartan (COZAAR) 25 MG tablet; Take 1 tablet (25 mg total) by mouth daily.  Dispense: 90 tablet; Refill: 1  2. Hypercholesteremia   3. Hypothyroidism, unspecified hypothyroidism type  - TSH - T4, free  4. Anxiety disorder, unspecified   5. Insomnia, unspecified   6. Goiter - TSH - T4, free  7. Overactive bladder  - Urine Microscopic Only  8. Gastroesophageal reflux disease without esophagitis  As we age balance is not as good as it was, so there is a higher risks for falls. Please remove small rugs and furniture that is "in your way" and could increase the risk of falls. Stretching exercises may help with fall prevention: Yoga and Tai Chi are some examples. Low impact exercise is better, so you are not very achy the next day. Healthy diet, small portions at the time.  Today no changes in medications.        If you sign-up for My chart, you can communicate easier with [place] in case you have any question or concern.

## 2016-04-14 NOTE — Progress Notes (Signed)
Subjective:    Patient ID: Melanie [name], female    DOB: [birth year], 75 y.o.   MRN: [id number]  HPI   Melanie [name] is a 75 y.o.female here today to establish care with me and to follow on some of her chronic medical problems, she has no new concerns. She has hx of HTN,HLD,thyroid disease, peripheral neuropathy (s/p chemo for colon cancer), chronic diarrhea (s/p colectomy), arthralgias/back pain, and anxiety among some.  She lives with husband Independent ADL's and IADL's.  HTN: Years hx, currently Cozaar 25 mg daily. She states that her former PCP kept stopping and resuming medication, last OV she was instructed to stop Cozaar and start HCTZ 12.5 mg as needed if BP was elevated.She is still on Cozaar and has not started HCTZ.  Exercising regularly, walks, and following a healthy/low salt diet. Home BP's usually "normal", 130's/60-70's. She thinks she had labs about a year ago, not sure when her last wellness exam was.   She has not noted unusual headache, visual changes, exertional chest pain, dyspnea, focal weakness, or changes in LE edema. Tolerating medications well with no major side effects reported.  HLD: She is on non pharmacologic treatment, stopped Pravastatin because cause LE achy pain.  Hypothyroidism: Dx over 15 years ago.  According to pt, her former PCP instructed to stop medication, over 3 months ago. + fatigue, intermittent.   Anxiety and insomnia: She is currently on Lorazepam 0.5 mg , which she has taken for about 20 years. Started initially for panic attacks, which have improved, she takes it daily at bedtime, 1/2-1 tab.Occasionally 1/2 tab during the day if anxious about going out or new stressful event. She denies Hx or symptoms of depression, no suicidal thoughts. She sleeps well most of the time, sometimes she wakes up a few times during the night.   Hx of lower back pain, has followed with ortho and improved with epidural injections, last year.  Pain is mild now, it does not interfere with daily activities. No radiated, and intermittently.   Review of Systems  Constitutional: Positive for fatigue (no more than usual). Negative for fever, activity change, appetite change and unexpected weight change.  HENT: Negative for facial swelling, mouth sores, nosebleeds and trouble swallowing.   Eyes: Negative for redness and visual disturbance.  Respiratory: Negative for cough, shortness of breath and wheezing.   Cardiovascular: Negative for chest pain, palpitations and leg swelling.  Gastrointestinal: Negative for nausea, vomiting and abdominal pain.       Negative for changes in bowel habits.  Endocrine: Negative for cold intolerance and heat intolerance.  Genitourinary: Negative for dysuria, hematuria, decreased urine volume and difficulty urinating.  Musculoskeletal: Positive for back pain. Negative for gait problem.  Neurological: Positive for numbness. Negative for seizures, syncope, weakness and headaches.  Hematological: Negative for adenopathy.  Psychiatric/Behavioral: Positive for sleep disturbance. Negative for suicidal ideas and confusion. The patient is nervous/anxious.      Current Outpatient Prescriptions on File Prior to Visit  Medication Sig Dispense Refill  . acetaminophen (TYLENOL) 650 MG CR tablet Take 650 mg by mouth every 8 (eight) hours as needed.    . loperamide (IMODIUM) 2 MG capsule Take 2 mg by mouth as needed.    Marland Kitchen LORazepam (ATIVAN) 0.5 MG tablet Take 0.5 mg by mouth at bedtime as needed.    Marland Kitchen omeprazole (PRILOSEC) 20 MG capsule Take 20 mg by mouth daily.    Marland Kitchen tolterodine (DETROL LA) 4 MG 24 hr capsule  Take 4 mg by mouth daily.     No current facility-administered medications on file prior to visit.     Past Medical History  Diagnosis Date  . Cancer ([place])     rectal ca  . Anxiety   . Thyroid disease     Social History   Social History  . Marital Status: Married    Spouse Name: N/A  . Number of  Children: N/A  . Years of Education: N/A   Social History Main Topics  . Smoking status: Former Smoker    Types: Cigarettes  . Smokeless tobacco: Never Used  . Alcohol Use: No  . Drug Use: No  . Sexual Activity: Not on file   Other Topics Concern  . Not on file   Social History Narrative   Family History  Problem Relation Age of Onset  . Cancer Sister     Lung cancer     Filed Vitals:   04/14/16 0950  BP: 144/68  Pulse: 72  Temp: 98.3 F (36.8 C)  Resp: 12   Body mass index is 22.05 kg/(m^2).       Objective:   Physical Exam  Constitutional: She is oriented to person, place, and time. She appears well-developed and well-nourished. No distress.  HENT:  Head: Atraumatic.  Nose: Septal deviation present.  Mouth/Throat: Oropharynx is clear and moist and mucous membranes are normal.  Eyes: Conjunctivae and EOM are normal. Pupils are equal, round, and reactive to light.  Neck: Thyromegaly present.  Cardiovascular: Normal rate, regular rhythm and normal heart sounds.   No murmur heard. Pulses:      Dorsalis pedis pulses are 2+ on the right side, and 2+ on the left side.  Bilateral varicose veins   Pulmonary/Chest: Effort normal and breath sounds normal. She has no wheezes. She has no rales.  Abdominal: Soft. She exhibits no mass. There is no tenderness.  Musculoskeletal: She exhibits edema (pitting trace LE edema, bilateral. Alsp peri ankle). She exhibits no tenderness.  Lymphadenopathy:    She has no cervical adenopathy.  Neurological: She is alert and oriented to person, place, and time. She has normal strength. No cranial nerve deficit. Coordination normal.  Stable gait with no assistance.  Psychiatric: Her speech is normal. Her mood appears anxious.  Well groomed, good eye contact       Assessment & Plan:     Lesli was seen today for establish care, hypertension and hypothyroidism.  Diagnoses and all orders for this visit:  Insomnia,  unspecified Stable with Lorazepam. Good sleep hygiene. Some side effects of medication discussed. F/U in 3 months.  Essential hypertension Here today mildly elevated, reporting home BP < 140/90. No changes in current management. DASH diet recommended. Eye exam recommended annually. F/U in 3 months.  -     COMPLETE METABOLIC PANEL WITH GFR -     losartan (COZAAR) 25 MG tablet; Take 1 tablet (25 mg total) by mouth daily.  Hypothyroidism, unspecified hypothyroidism type  I will follow labs done today and will give further recommendations accordingly.  -     TSH -     T4, free  Anxiety disorder, unspecified Stable. May consider Cymbalta in the future, she may benefit from this medication given her Hx of neuropathic pain. No changes in Lorazepam.She has a refill left from former pcp.  Goiter  Asymptomatic. Will continue monitoring. She is not sure about last u/s, will wait for records.  -     TSH -  T4, free  Overactive bladder  Well controlled with current management. No changes today. Avoid constipation.  -     Urine Microscopic Only  Gastroesophageal reflux disease without esophagitis GERD precautions discussed. PPI side effects discussed. Well controlled with current management.  Hypercholesteremia Continue low fat diet. She has not tolerated statin med in the past. We can check it next OV.  Fall precautions discussed. Next OV she can have her routine physical and we can also follow on anxiety and HTN.  Lashia Niese G. Martinique, MD  [facility]. [place] office.

## 2016-04-15 ENCOUNTER — Telehealth: Payer: Self-pay | Admitting: Family Medicine

## 2016-04-15 LAB — COMPLETE METABOLIC PANEL WITH GFR
ALT: 17 U/L (ref 6–29)
AST: 22 U/L (ref 10–35)
Albumin: 4.3 g/dL (ref 3.6–5.1)
Alkaline Phosphatase: 63 U/L (ref 33–130)
BUN: 16 mg/dL (ref 7–25)
CO2: 30 mmol/L (ref 20–31)
Calcium: 10.1 mg/dL (ref 8.6–10.4)
Chloride: 98 mmol/L (ref 98–110)
Creat: 0.83 mg/dL (ref 0.60–0.93)
GFR, Est African American: 79 mL/min (ref >=60)
GFR, Est Non African American: 69 mL/min (ref >=60)
Glucose, Bld: 86 mg/dL (ref 65–99)
Potassium: 4.7 mmol/L (ref 3.5–5.3)
Sodium: 138 mmol/L (ref 135–146)
Total Bilirubin: 0.6 mg/dL (ref 0.2–1.2)
Total Protein: 7.5 g/dL (ref 6.1–8.1)

## 2016-04-15 NOTE — Telephone Encounter (Signed)
Pharmacy is been change

## 2016-04-15 NOTE — Telephone Encounter (Signed)
Pt has been scheduled.  °

## 2016-04-15 NOTE — Telephone Encounter (Signed)
Pt would like you to change her preferred pharmacy to [facility]/ [address]  (they can no longer use [facility])  Please change in chart. Thank you!

## 2016-06-02 ENCOUNTER — Telehealth: Payer: Self-pay | Admitting: Oncology

## 2016-06-02 NOTE — Telephone Encounter (Signed)
left msg confirming 7/25 apt... apt moved per pof

## 2016-06-16 ENCOUNTER — Ambulatory Visit: Payer: Medicare Other | Admitting: Oncology

## 2016-06-16 ENCOUNTER — Other Ambulatory Visit: Payer: Medicare Other

## 2016-06-24 ENCOUNTER — Other Ambulatory Visit: Payer: Self-pay | Admitting: Family Medicine

## 2016-06-24 DIAGNOSIS — Z1231 Encounter for screening mammogram for malignant neoplasm of breast: Secondary | ICD-10-CM

## 2016-07-06 ENCOUNTER — Ambulatory Visit
Admission: RE | Admit: 2016-07-06 | Discharge: 2016-07-06 | Disposition: A | Discharge status: Home / Self Care | Payer: Medicare Other | Source: Ambulatory Visit | Attending: Family Medicine | Admitting: Family Medicine

## 2016-07-06 DIAGNOSIS — Z1231 Encounter for screening mammogram for malignant neoplasm of breast: Secondary | ICD-10-CM | POA: Diagnosis not present

## 2016-07-19 ENCOUNTER — Ambulatory Visit (HOSPITAL_BASED_OUTPATIENT_CLINIC_OR_DEPARTMENT_OTHER): Payer: Medicare Other | Admitting: Oncology

## 2016-07-19 ENCOUNTER — Other Ambulatory Visit: Payer: Medicare Other

## 2016-07-19 VITALS — BP 146/60 | HR 82 | Temp 98.1°F | Resp 18 | Ht 68.0 in | Wt 142.3 lb

## 2016-07-19 DIAGNOSIS — R200 Anesthesia of skin: Secondary | ICD-10-CM

## 2016-07-19 DIAGNOSIS — Z85038 Personal history of other malignant neoplasm of large intestine: Secondary | ICD-10-CM | POA: Diagnosis not present

## 2016-07-19 DIAGNOSIS — G8929 Other chronic pain: Secondary | ICD-10-CM

## 2016-07-19 DIAGNOSIS — C20 Malignant neoplasm of rectum: Secondary | ICD-10-CM

## 2016-07-19 DIAGNOSIS — M545 Low back pain: Secondary | ICD-10-CM | POA: Diagnosis not present

## 2016-07-19 DIAGNOSIS — R194 Change in bowel habit: Secondary | ICD-10-CM

## 2016-07-19 NOTE — Progress Notes (Signed)
  [place] OFFICE PROGRESS NOTE   Diagnosis: Rectal cancer  INTERVAL HISTORY:   Melanie [name] returns as scheduled. She feels well. She reports chronic back pain. She has chronic numbness in the distal feet. She has foot pain at night. She continues to have irregular and frequent bowel movements. She is scheduled for a colonoscopy next week. She reports a good appetite.  Objective:  Vital signs in last 24 hours:  Blood pressure (!) 146/60, pulse 82, temperature 98.1 F (36.7 C), temperature source Oral, resp. rate 18, height 5\' 8"  (1.727 m), weight 142 lb 4.8 oz (64.5 kg), SpO2 95 %.    HEENT: Neck without mass Lymphatics: No cervical, supra-clavicular, axillary, or inguinal nodes Resp: Scattered coarse rhonchi at the bases, no respiratory distress Cardio: Regular rate and rhythm GI: No hepatomegaly, no mass, nontender Vascular: No leg edema   Medications: I have reviewed the patient's current medications.  Assessment/Plan: 1. Pathologic stage III (pT3 pN1) rectal cancer, status post neoadjuvant Xeloda and concurrent radiation completed 05/28/2009. She is status post a low anterior resection and temporary ileostomy 07/16/2009. She completed 10 cycles of adjuvant FOLFOX chemotherapy 08/26/2009 through 01/27/2010. Oxaliplatin was held with cycles #5, 7, and 10. A restaging CT evaluation on 03/10/2011 showed no evidence of metastatic disease. Restaging CT evaluation 03/08/2012 negative for evidence of metastatic disease. Last surveillance colonoscopy April of 2012. 2. Status post ileostomy reversal March 2011. 3. Postoperative wound infection 2010. 4. History of thrombocytopenia secondary to chemotherapy. 5. Emphysema on a CT 03/12/2009. 6. Hypercholesterolemia. 7. Hypothyroidism. She is on Synthroid. 8. History of diarrhea secondary to Xeloda and radiation. 9. Delayed nausea following cycle #1 of FOLFOX, improved with the addition of Aloxi. 10. History of hand-foot  syndrome secondary to Xeloda. 11. History of ocular symptoms with itching, burning, and tearing, question 5-FU toxicity. 12. Bone pain following Neulasta. 13. History of a mildly elevated creatinine. The creatinine was normal 01/27/2010 and 03/10/2011. 14. History of mild hand-foot syndrome secondary to 5-FU. 15. Question pneumonitis versus infectious changes in the upper lungs on a CT 03/08/2010. The CT 03/10/2011 showed resolution of previously-described subtle upper lobe nodularity possibly representing hypersensitivity pneumonitis or respiratory bronchiolitis.    Disposition:  Melanie [name] remains in clinical remission from rectal cancer. She reports she is scheduled for a colonoscopy next week.  She would like to continue clinical follow-up with Dr. Martinique. The foot numbness/pain could be related to chemotherapy-induced neuropathy. Dr. Martinique K consider a trial of gabapentin, Cymbalta, or amitriptyline. I did not prescribe medication today since Melanie [name] will no longer be followed at the [facility].  The irregular bowel habits in frequency could potentially be helped with pelvic physical therapy. She declined a referral today stating her insurance will not cover this.  She was discharged from the Cancer center today. I am available to see her in the future as needed.  Betsy Coder, MD  07/19/2016  12:12 PM

## 2016-07-20 ENCOUNTER — Telehealth: Payer: Self-pay | Admitting: Oncology

## 2016-07-20 NOTE — Telephone Encounter (Signed)
per pof f/u TBA

## 2016-07-25 DIAGNOSIS — Z9049 Acquired absence of other specified parts of digestive tract: Secondary | ICD-10-CM | POA: Diagnosis not present

## 2016-07-25 DIAGNOSIS — Z1211 Encounter for screening for malignant neoplasm of colon: Secondary | ICD-10-CM | POA: Diagnosis not present

## 2016-07-25 DIAGNOSIS — K5730 Diverticulosis of large intestine without perforation or abscess without bleeding: Secondary | ICD-10-CM | POA: Diagnosis not present

## 2016-07-25 DIAGNOSIS — Z85038 Personal history of other malignant neoplasm of large intestine: Secondary | ICD-10-CM | POA: Diagnosis not present

## 2016-07-25 DIAGNOSIS — K648 Other hemorrhoids: Secondary | ICD-10-CM | POA: Diagnosis not present

## 2016-07-25 DIAGNOSIS — K641 Second degree hemorrhoids: Secondary | ICD-10-CM | POA: Diagnosis not present

## 2016-07-25 DIAGNOSIS — Z85048 Personal history of other malignant neoplasm of rectum, rectosigmoid junction, and anus: Secondary | ICD-10-CM | POA: Diagnosis not present

## 2016-08-03 ENCOUNTER — Ambulatory Visit (INDEPENDENT_AMBULATORY_CARE_PROVIDER_SITE_OTHER): Payer: Medicare Other | Admitting: Family Medicine

## 2016-08-03 ENCOUNTER — Encounter: Payer: Self-pay | Admitting: Family Medicine

## 2016-08-03 VITALS — BP 150/70 | HR 82 | Resp 12 | Ht 68.0 in | Wt 143.2 lb

## 2016-08-03 DIAGNOSIS — F419 Anxiety disorder, unspecified: Secondary | ICD-10-CM | POA: Insufficient documentation

## 2016-08-03 DIAGNOSIS — M5417 Radiculopathy, lumbosacral region: Secondary | ICD-10-CM

## 2016-08-03 DIAGNOSIS — I10 Essential (primary) hypertension: Secondary | ICD-10-CM

## 2016-08-03 DIAGNOSIS — G620 Drug-induced polyneuropathy: Secondary | ICD-10-CM | POA: Diagnosis not present

## 2016-08-03 DIAGNOSIS — M5416 Radiculopathy, lumbar region: Secondary | ICD-10-CM | POA: Insufficient documentation

## 2016-08-03 DIAGNOSIS — T451X5A Adverse effect of antineoplastic and immunosuppressive drugs, initial encounter: Secondary | ICD-10-CM | POA: Insufficient documentation

## 2016-08-03 MED ORDER — LOSARTAN POTASSIUM 50 MG PO TABS: 50.0000 mg | ORAL_TABLET | Freq: Every day | ORAL | 1 refills | Status: DC

## 2016-08-03 MED ORDER — DULOXETINE HCL 30 MG PO CPEP: 30.0000 mg | ORAL_CAPSULE | Freq: Every day | ORAL | 1 refills | Status: DC

## 2016-08-03 MED ORDER — LORAZEPAM 0.5 MG PO TABS: 0.5000 mg | ORAL_TABLET | Freq: Every evening | ORAL | 2 refills | Status: DC | PRN

## 2016-08-03 NOTE — Patient Instructions (Addendum)
A few things to remember from today's visit:   Lumbar back pain with radiculopathy affecting right lower extremity - Plan: DULoxetine (CYMBALTA) 30 MG capsule  Peripheral neuropathy due to chemotherapy (HCC)  Essential hypertension - Plan: losartan (COZAAR) 50 MG tablet, DISCONTINUED: losartan (COZAAR) 50 MG tablet, DISCONTINUED: losartan (COZAAR) 50 MG tablet  Anxiety disorder, unspecified - Plan: DULoxetine (CYMBALTA) 30 MG capsule, LORazepam (ATIVAN) 0.5 MG tablet  Calcium 1200 mg daily and vit D 800 U daily.   Fall precautions. Cozaar increased. Monitor blood pressure, goal less than 150/90   Please be sure medication list is accurate. If a new problem present, please set up appointment sooner than planned today.

## 2016-08-03 NOTE — Progress Notes (Addendum)
HPI:   Melanie [name] is a 75 y.o. female, who is here today to follow on HTN and she has other concerns and questions.  -She wants to know if she should take Ca++ or multivitamins, if she needs another pneumonia vaccination, something for energy,arthralgias, and concerned about BP readings.  According to patient, she followed recently with oncologist 07/21/16, she was released after 17 years of cancer of the status. She has history of rectal cancer, she was treated with chemotherapy and since then she has had bilateral foot numbness sensation. According to patient, oncologist explained to her this was caused by one of the chemotherapy medications,Sally Oxyplate. No focal weakness or rash.  Hx of lower back pain, has followed with ortho, has had epidural injections.Pain is mild to moderate, radiated to RLE like "electricity" that goes down to lateral aspect of right feet and up to her back, "bad" sometimes.  Exacerbated by certain activities, otherwise stable. No changes in urine or bowel continence.No recent fall or trauma. Hx of diarrhea , intermittently and depending of what she eats, s/p colectomy.  + Generalized arthralgias, mainly hands. + Morning stiffness, cannot open jars. No erythema or edema.   Anxiety and insomnia:   She is currently on Lorazepam 0.5 mg , which she has taken for about 20 years.  She takes it daily at bedtime, 1/2-1 tab. Occasionally 1/2 tab during the day if anxious about going out or stressful event. She denies of depression or suicidal thoughts.  Lives with husband , who also has anxiety.  + Fatigue, which has been going on for a few years. She states that she wishes she could do all the things she wants to do during the day.  She sleeps well overall.    Hypertension:  BP readings brought today are otherwise adequate, 120's-130's/50's-60's, a few in the 140's.  Currently she is on Cozaar 25 mg daily. She is taking medications as  instructed, no side effects reported.  She has not noted unusual headache, visual changes, exertional chest pain, dyspnea,  focal weakness, or edema.  Lab Results  Component Value Date   CREATININE 0.83 04/14/2016   BUN 16 04/14/2016   NA 138 04/14/2016   K 4.7 04/14/2016   CL 98 04/14/2016   CO2 30 04/14/2016     Review of Systems  Constitutional: Positive for fatigue (no more than usual). Negative for activity change, appetite change, fever and unexpected weight change.  HENT: Negative for facial swelling, mouth sores, nosebleeds and trouble swallowing.   Eyes: Negative for redness and visual disturbance.  Respiratory: Negative for cough, shortness of breath and wheezing.   Cardiovascular: Negative for chest pain, palpitations and leg swelling.  Gastrointestinal: Positive for diarrhea (chronic, s/p colectomy). Negative for abdominal pain, blood in stool, nausea and vomiting.       Negative for changes in bowel habits.  Genitourinary: Negative for decreased urine volume, difficulty urinating, dysuria and hematuria.  Musculoskeletal: Positive for arthralgias and back pain. Negative for gait problem.  Neurological: Positive for numbness. Negative for seizures, syncope, weakness and headaches.  Hematological: Negative for adenopathy.  Psychiatric/Behavioral: Positive for sleep disturbance. Negative for confusion and suicidal ideas. The patient is nervous/anxious.       Current Outpatient Prescriptions on File Prior to Visit  Medication Sig Dispense Refill  . acetaminophen (TYLENOL) 650 MG CR tablet Take 650 mg by mouth every 8 (eight) hours as needed.    . cetirizine (ZYRTEC) 10 MG tablet Take  10 mg by mouth daily as needed for allergies.    Marland Kitchen loperamide (IMODIUM) 2 MG capsule Take 2 mg by mouth as needed.    Marland Kitchen omeprazole (PRILOSEC) 20 MG capsule Take 20 mg by mouth daily.    . Red Yeast Rice 600 MG CAPS Take 600 mg by mouth 2 (two) times daily.    Marland Kitchen tolterodine (DETROL LA) 4 MG  24 hr capsule Take 4 mg by mouth daily.    . Zinc 50 MG TABS Take 50 mg by mouth daily.     No current facility-administered medications on file prior to visit.      Past Medical History:  Diagnosis Date  . Anxiety   . Cancer ([place])    rectal ca  . Thyroid disease    Allergies  Allergen Reactions  . Latex Rash  . Macrodantin Shortness Of Breath  . Guaifenesin Er Itching  . Morphine And Related Nausea Only  . Lipitor [Atorvastatin]     rash  . Lisinopril     Diarrhea/cough  . Pravastatin     Leg cramps  . Zoloft [Sertraline Hcl]     Diarrhea    Social History   Social History  . Marital status: Married    Spouse name: N/A  . Number of children: N/A  . Years of education: N/A   Social History Main Topics  . Smoking status: Former Smoker    Types: Cigarettes  . Smokeless tobacco: Never Used  . Alcohol use No  . Drug use: No  . Sexual activity: Not Asked   Other Topics Concern  . None   Social History Narrative  . None    Vitals:   08/03/16 0920  BP: (!) 150/70  Pulse: 82  Resp: 12   Body mass index is 21.78 kg/m.  O2 sat 96% RA.    Physical Exam  Nursing note and vitals reviewed. Constitutional: She is oriented to person, place, and time. She appears well-developed and well-nourished. No distress.  HENT:  Head: Atraumatic.  Mouth/Throat: Oropharynx is clear and moist and mucous membranes are normal.  Eyes: Conjunctivae and EOM are normal. Pupils are equal, round, and reactive to light.  Neck: No tracheal deviation present. Thyromegaly present.  Cardiovascular: Normal rate and regular rhythm.   No murmur heard. Pulses:      Dorsalis pedis pulses are 2+ on the right side, and 2+ on the left side.  Respiratory: Effort normal and breath sounds normal. No respiratory distress.  GI: Soft. She exhibits no mass. There is no tenderness.  Musculoskeletal: She exhibits no edema.  Mild tenderness upon palpation of right lumbar paraspinal muscles. Pain  elicited with movement on exam table during examination. Mild limitation of ROM wrists and IP joints bilateral, hypertrophic joints. No signs of synovitis.    Lymphadenopathy:    She has no cervical adenopathy.  Neurological: She is alert and oriented to person, place, and time. She has normal strength. Coordination normal.  Monofilament decreased bilateral. Stable gait with no assistance.  Skin: Skin is warm. No erythema.  Psychiatric: Her speech is normal. Her mood appears anxious.  Well groomed, good eye contact.       ASSESSMENT AND PLAN:     Alizia was seen today for follow-up.  Diagnoses and all orders for this visit:  Lumbar back pain with radiculopathy affecting right lower extremity  Cymbalta 30 mg started today. Continue following with orthopedist as needed. We might consider adding gabapentin later on.   -  DULoxetine (CYMBALTA) 30 MG capsule; Take 1 capsule (30 mg total) by mouth daily.  Peripheral neuropathy due to chemotherapy [facility])  We discussed a few treatment options: Cymbalta, Lyrica, Gabapentin.  Reviewed some side effects of these medications. Because her history of generalized osteoarthritis, lower back pain, and anxiety of insulin-dependent benefit from Cymbalta She is very anxious about starting a new medication but at the same time she would like to try something, she agrees with starting Cymbalta 30 mg daily. I will see her back in 6 weeks. Foot care discussed.   Essential hypertension  165/80, both arms, not well controlled. Low salt diet to continue. Goal < 150/90, Cozaar increased from 25 mg to 50 mg. F/U in 6 weeks.  -     losartan (COZAAR) 50 MG tablet; Take 1 tablet (50 mg total) by mouth daily.  Anxiety disorder, unspecified  Continue lorazepam, some side effects discussed. She may benefit from Cymbalta. Follow-up in 6 weeks, before if needed.  -     DULoxetine (CYMBALTA) 30 MG capsule; Take 1 capsule (30 mg total) by  mouth daily. -     LORazepam (ATIVAN) 0.5 MG tablet; Take 1 tablet (0.5 mg total) by mouth at bedtime as needed.    -We also discussed recommendations in regard to calcium intake, recommended 1200 mg ideally through diet and vitamin D 800 units daily. -We need to arrange for a physical/preventive visit, which she has not had in a while.   -Ms. Dilan L Safran was advised to return sooner than planned today if new concerns arise.       Tawnya Pujol G. Martinique, MD  [facility]. [place] office.

## 2016-08-03 NOTE — Progress Notes (Signed)
Pre visit review using our clinic review tool, if applicable. No additional management support is needed unless otherwise documented below in the visit note. 

## 2016-08-09 ENCOUNTER — Telehealth: Payer: Self-pay | Admitting: Family Medicine

## 2016-08-09 NOTE — Telephone Encounter (Signed)
So stop Cymbalta (take from med list). She could try Gabapentin 100 mg at bedtime, this can be titrated up to 300 mg at bedtime.  Foot care and adequate shoe wear, she has Hx of neuropathy. Thanks, BJ

## 2016-08-09 NOTE — Telephone Encounter (Signed)
Pt states the DULoxetine (CYMBALTA) 30 MG capsule Caused her tongue to get raw, no appetite, jittery  nauseous, and weak.  Pt has stopped taking. Cannot take this! Would like your advice.  This had been prescribed for feet pain.

## 2016-08-09 NOTE — Telephone Encounter (Signed)
Please advise 

## 2016-08-10 ENCOUNTER — Other Ambulatory Visit: Payer: Self-pay

## 2016-08-10 MED ORDER — GABAPENTIN 100 MG PO CAPS: 100.0000 mg | ORAL_CAPSULE | Freq: Every day | ORAL | 0 refills | Status: DC

## 2016-08-10 NOTE — Telephone Encounter (Signed)
Talked to patient's husband and he will let her know that we sent a new Rx in. I advised them to let us know how she is doing on it within a couple of days.

## 2016-09-06 ENCOUNTER — Other Ambulatory Visit: Payer: Self-pay | Admitting: Family Medicine

## 2016-09-06 NOTE — Telephone Encounter (Signed)
Rx refill sent to pharmacy. 

## 2016-09-14 ENCOUNTER — Encounter: Payer: Self-pay | Admitting: Family Medicine

## 2016-09-14 ENCOUNTER — Ambulatory Visit (INDEPENDENT_AMBULATORY_CARE_PROVIDER_SITE_OTHER): Payer: Medicare Other | Admitting: Family Medicine

## 2016-09-14 VITALS — BP 138/70 | HR 79 | Temp 97.8°F | Ht 68.0 in | Wt 142.3 lb

## 2016-09-14 DIAGNOSIS — I10 Essential (primary) hypertension: Secondary | ICD-10-CM | POA: Diagnosis not present

## 2016-09-14 DIAGNOSIS — G620 Drug-induced polyneuropathy: Secondary | ICD-10-CM

## 2016-09-14 DIAGNOSIS — M5416 Radiculopathy, lumbar region: Secondary | ICD-10-CM

## 2016-09-14 DIAGNOSIS — M5417 Radiculopathy, lumbosacral region: Secondary | ICD-10-CM | POA: Diagnosis not present

## 2016-09-14 DIAGNOSIS — T451X5A Adverse effect of antineoplastic and immunosuppressive drugs, initial encounter: Secondary | ICD-10-CM | POA: Diagnosis not present

## 2016-09-14 DIAGNOSIS — Z23 Encounter for immunization: Secondary | ICD-10-CM | POA: Diagnosis not present

## 2016-09-14 MED ORDER — PREGABALIN 25 MG PO CAPS: 25.0000 mg | ORAL_CAPSULE | Freq: Every day | ORAL | 0 refills | Status: DC

## 2016-09-14 NOTE — Progress Notes (Signed)
HPI:   [name] is a 75 y.o. female, who is here today to follow on some problems addressed during her last OV, 08/03/2016.  She has known history of generalized osteoarthritis and lower back pain radiated to RLE, she has had epidural injections which helped in the past. Last office visit I recommended Cymbalta low-dose, a few days after taking it she discontinued because side effects: nausea, or "weakness", decreased appetite, "tongue raw", and feeling "jettery." Gabapentin 100 mg at bedtime was recommended after she discontinued Cymbalta, she didn't tolerate this medication either, she developed a facial rash, "fever blisters", "jittery" feeling, and "weakness."  Overall pain is a stable, lower back pain seems to be worse at night. Pain on RLE like "electricity" that goes down to lateral aspect of right feet and up to her back. She states that sometimes it can be "bad", she really would like to try another medication.  Also history of peripheral neuropathy, attributed to chemotherapy.   She has not noted any rash, fever, claudication, or color changes on extremity.  Hypertension: Last OV Cozaar was increased from 25 mg to 50 mg.  She brought blood pressure readings and most readings 120's-130's/60-70's, one 142/70 (out of 10 BP readings). Denies severe/frequent headache, visual changes, chest pain, dyspnea, palpitation, focal weakness, or edema.   No new concerns today:    Review of Systems  Constitutional: Positive for fatigue (no more than usual). Negative for activity change, appetite change, fever and unexpected weight change.  HENT: Negative for facial swelling, mouth sores, nosebleeds and trouble swallowing.   Eyes: Negative for redness and visual disturbance.  Respiratory: Negative for cough, shortness of breath and wheezing.   Cardiovascular: Negative for chest pain, palpitations and leg swelling.  Gastrointestinal: Negative for abdominal pain, nausea  and vomiting.       Negative for changes in bowel habits.  Genitourinary: Negative for decreased urine volume, difficulty urinating and hematuria.  Musculoskeletal: Positive for arthralgias and back pain. Negative for gait problem.  Neurological: Positive for numbness (feet). Negative for syncope, facial asymmetry, weakness and headaches.  Psychiatric/Behavioral: Positive for sleep disturbance. Negative for confusion and suicidal ideas. The patient is nervous/anxious.       Current Outpatient Prescriptions on File Prior to Visit  Medication Sig Dispense Refill  . acetaminophen (TYLENOL) 650 MG CR tablet Take 650 mg by mouth every 8 (eight) hours as needed.    . cetirizine (ZYRTEC) 10 MG tablet Take 10 mg by mouth daily as needed for allergies.    Marland Kitchen loperamide (IMODIUM) 2 MG capsule Take 2 mg by mouth as needed.    Marland Kitchen LORazepam (ATIVAN) 0.5 MG tablet Take 1 tablet (0.5 mg total) by mouth at bedtime as needed. 30 tablet 2  . losartan (COZAAR) 50 MG tablet Take 1 tablet (50 mg total) by mouth daily. 90 tablet 1  . omeprazole (PRILOSEC) 20 MG capsule Take 20 mg by mouth daily.    . Red Yeast Rice 600 MG CAPS Take 600 mg by mouth 2 (two) times daily.    Marland Kitchen tolterodine (DETROL LA) 4 MG 24 hr capsule Take 4 mg by mouth daily.    . Zinc 50 MG TABS Take 50 mg by mouth daily.     No current facility-administered medications on file prior to visit.      Past Medical History:  Diagnosis Date  . Anxiety   . Cancer ([place])    rectal ca  . Thyroid disease  Allergies  Allergen Reactions  . Latex Rash  . Macrodantin Shortness Of Breath  . Guaifenesin Er Itching  . Morphine And Related Nausea Only  . Lipitor [Atorvastatin]     rash  . Lisinopril     Diarrhea/cough  . Pravastatin     Leg cramps  . Zoloft [Sertraline Hcl]     Diarrhea    Social History   Social History  . Marital status: Married    Spouse name: N/A  . Number of children: N/A  . Years of education: N/A   Social  History Main Topics  . Smoking status: Former Smoker    Types: Cigarettes  . Smokeless tobacco: Never Used  . Alcohol use No  . Drug use: No  . Sexual activity: Not Asked   Other Topics Concern  . None   Social History Narrative  . None    Vitals:   09/14/16 0926  BP: 138/70  Pulse: 79  Temp: 97.8 F (36.6 C)   Body mass index is 21.64 kg/m.  Wt Readings from Last 3 Encounters:  09/14/16 142 lb 5 oz (64.6 kg)  08/03/16 143 lb 4 oz (65 kg)  07/19/16 142 lb 4.8 oz (64.5 kg)      Physical Exam  Nursing note and vitals reviewed. Constitutional: She is oriented to person, place, and time. She appears well-developed and well-nourished. No distress.  HENT:  Head: Atraumatic.  Mouth/Throat: Oropharynx is clear and moist and mucous membranes are normal.  Eyes: Conjunctivae and EOM are normal.  Neck: No JVD present.  Cardiovascular: Normal rate and regular rhythm.   Murmur (soft SEM RUSB) heard. Pulses:      Dorsalis pedis pulses are 2+ on the right side, and 2+ on the left side.  Respiratory: Effort normal and breath sounds normal. No respiratory distress.  GI: Soft. She exhibits no mass. There is no tenderness.  Musculoskeletal: She exhibits no edema.  Mild tenderness upon palpation of lumbar paraspinal muscles. SLR negative bilateral. No signs of synovitis.  Lymphadenopathy:    She has no cervical adenopathy.  Neurological: She is alert and oriented to person, place, and time. She has normal strength. Coordination normal.  Stable gait with no assistance.  Skin: Skin is warm. No erythema.  Psychiatric: Her speech is normal. Her mood appears anxious.  Well groomed, good eye contact.      ASSESSMENT AND PLAN:     Melanie [name] was seen today for follow-up.  Diagnoses and all orders for this visit:  Essential hypertension  Adequately controlled. No changes in current management. DASH diet recommended. Eye exam recommended annually. F/U in 6 months, before if  needed.  Lumbar back pain with radiculopathy affecting right lower extremity  Referral to ortho placed to evaluate benefit Vs risk of another epidural injection, which she reports helped greatly in the past. Lyrica may also help with radicular pain. Fall precautions discussed.  Because she is already taking benzodiazepines for insomnia and anxiety, I would prefer not to add an opioid medication or Tramadol for pain management. We might need to consider it later on if pain gets worse.  -     Ambulatory referral to Orthopedic Surgery  Peripheral neuropathy due to chemotherapy [facility])  Explained that given her age we had many options, she already didn't tolerate Cymbalta and gabapentin. After discussion of some side effects, Lyrica might be better tolerated, she agrees to try low-dose: 25 mg at bedtime. Fall precautions discussed. Follow-up in 3 months, before if needed.   -  pregabalin (LYRICA) 25 MG capsule; Take 1 capsule (25 mg total) by mouth at bedtime.  Need for immunization against influenza -     Flu vaccine HIGH DOSE PF      -Ms. Melanie [name] was advised to return sooner than planned today if new concerns arise.       Estefanie Cornforth G. Martin[place], MD  [facility]. [place] office.

## 2016-09-14 NOTE — Patient Instructions (Signed)
A few things to remember from today's visit:   Essential hypertension  Lumbar back pain with radiculopathy affecting right lower extremity - Plan: Ambulatory referral to Orthopedic Surgery  Peripheral neuropathy due to chemotherapy ([place]) - Plan: pregabalin (LYRICA) 25 MG capsule  Try Lyrica low dose, 25 mg at bedtime and if well tolerated we can increase it. No many options for arthritis.  Fall precautions.  No changes in blood pressure meds.  Please be sure medication list is accurate. If a new problem present, please set up appointment sooner than planned today.

## 2016-09-14 NOTE — Progress Notes (Signed)
Pre visit review using our clinic review tool, if applicable. No additional management support is needed unless otherwise documented below in the visit note. 

## 2016-09-15 ENCOUNTER — Telehealth: Payer: Self-pay

## 2016-09-15 NOTE — Telephone Encounter (Signed)
We discussed available options, she did not tolerate gabapentin or Cymbalta, so Lyrica was my last option for treatment of neuropathy. She may be able of printing a coupon on their web side, sometimes they also have free trials. Appt with ortho was placed to evaluate RLE radicular pain and see if she benefits from epidural injection or other procedure.  Thanks, BJ

## 2016-09-15 NOTE — Telephone Encounter (Signed)
Left voicemail for patient to call the office back.   

## 2016-09-15 NOTE — Telephone Encounter (Signed)
Patient had called saying that the Lyrica was going to be to expensive. I don't believe the generic is available. Is there something else that we can send in for her?

## 2016-09-16 NOTE — Telephone Encounter (Signed)
This medication I prefer to start at bedtime because it can cause drowsiness an therefore increase risk of falls.Then I usually titrate up to tid. If she still wants to try during the day, try in the afternoon and be cautious with falls, ideally stay at home. Side effects usually resolves a few weeks after startling medication.  Thanks, BJ

## 2016-09-16 NOTE — Telephone Encounter (Signed)
She is thinking about trying the gabapentin again. Is it okay if she takes it during the morning or during the afternoon instead of at bedtime?

## 2016-09-16 NOTE — Telephone Encounter (Signed)
Informed patient and told her to let us know how she's doing on the medication next week.

## 2016-09-21 ENCOUNTER — Telehealth: Payer: Self-pay | Admitting: Family Medicine

## 2016-09-21 NOTE — Telephone Encounter (Signed)
Pt wants you to know she has resumed her gabapentin, and she wants to know if safe to take tylenol with this med.

## 2016-09-21 NOTE — Telephone Encounter (Signed)
See below

## 2016-09-21 NOTE — Telephone Encounter (Signed)
It is Ok to take Tylenol while taking Gabapentin. Thanks, BJ

## 2016-09-22 NOTE — Telephone Encounter (Signed)
Informed patient of response & she verbalized understanding.

## 2016-09-26 DIAGNOSIS — M25551 Pain in right hip: Secondary | ICD-10-CM | POA: Diagnosis not present

## 2016-09-26 DIAGNOSIS — M48061 Spinal stenosis, lumbar region without neurogenic claudication: Secondary | ICD-10-CM | POA: Diagnosis not present

## 2016-09-29 ENCOUNTER — Telehealth: Payer: Self-pay

## 2016-09-29 ENCOUNTER — Other Ambulatory Visit: Payer: Self-pay

## 2016-09-29 NOTE — Telephone Encounter (Signed)
Received refill request for Gabapentin and Tolterodine Tart ER 4mg  to be sent to [facility]. Refills okay?

## 2016-09-30 MED ORDER — GABAPENTIN 100 MG PO CAPS: 100.0000 mg | ORAL_CAPSULE | Freq: Every day | ORAL | 1 refills | Status: DC

## 2016-09-30 MED ORDER — TOLTERODINE TARTRATE ER 4 MG PO CP24: 4.0000 mg | ORAL_CAPSULE | Freq: Every day | ORAL | 0 refills | Status: DC

## 2016-09-30 NOTE — Telephone Encounter (Signed)
Gabapentin can be refills, not sure how much she is currently taking. Detrol ER (Tolterodine) , I have not addressed but she can have #90 tabs/0. Given the fact she decided to continue Gabapentin we need to folow in 2-3 months. Thanks, BJ

## 2016-09-30 NOTE — Telephone Encounter (Signed)
Rxs sent

## 2016-10-03 ENCOUNTER — Other Ambulatory Visit: Payer: Self-pay | Admitting: Family Medicine

## 2016-10-04 ENCOUNTER — Telehealth: Payer: Self-pay | Admitting: Family Medicine

## 2016-10-04 NOTE — Telephone Encounter (Signed)
error 

## 2016-10-07 DIAGNOSIS — M48061 Spinal stenosis, lumbar region without neurogenic claudication: Secondary | ICD-10-CM | POA: Diagnosis not present

## 2016-10-24 DIAGNOSIS — M48061 Spinal stenosis, lumbar region without neurogenic claudication: Secondary | ICD-10-CM | POA: Diagnosis not present

## 2016-10-31 ENCOUNTER — Other Ambulatory Visit: Payer: Self-pay | Admitting: Family Medicine

## 2016-11-25 ENCOUNTER — Ambulatory Visit (INDEPENDENT_AMBULATORY_CARE_PROVIDER_SITE_OTHER): Payer: Medicare Other | Admitting: Family Medicine

## 2016-11-25 ENCOUNTER — Encounter: Payer: Self-pay | Admitting: Family Medicine

## 2016-11-25 VITALS — BP 130/80 | HR 86 | Resp 12 | Ht 68.0 in | Wt 143.1 lb

## 2016-11-25 DIAGNOSIS — I10 Essential (primary) hypertension: Secondary | ICD-10-CM | POA: Diagnosis not present

## 2016-11-25 DIAGNOSIS — T451X5A Adverse effect of antineoplastic and immunosuppressive drugs, initial encounter: Secondary | ICD-10-CM

## 2016-11-25 DIAGNOSIS — R05 Cough: Secondary | ICD-10-CM | POA: Diagnosis not present

## 2016-11-25 DIAGNOSIS — M5417 Radiculopathy, lumbosacral region: Secondary | ICD-10-CM

## 2016-11-25 DIAGNOSIS — F418 Other specified anxiety disorders: Secondary | ICD-10-CM | POA: Diagnosis not present

## 2016-11-25 DIAGNOSIS — R059 Cough, unspecified: Secondary | ICD-10-CM

## 2016-11-25 DIAGNOSIS — M5416 Radiculopathy, lumbar region: Secondary | ICD-10-CM

## 2016-11-25 DIAGNOSIS — N3281 Overactive bladder: Secondary | ICD-10-CM | POA: Diagnosis not present

## 2016-11-25 DIAGNOSIS — G620 Drug-induced polyneuropathy: Secondary | ICD-10-CM | POA: Diagnosis not present

## 2016-11-25 LAB — BASIC METABOLIC PANEL
BUN: 16 mg/dL (ref 6–23)
CO2: 30 mEq/L (ref 19–32)
Calcium: 10 mg/dL (ref 8.4–10.5)
Chloride: 100 mEq/L (ref 96–112)
Creatinine, Ser: 0.82 mg/dL (ref 0.40–1.20)
GFR: 71.79 mL/min (ref >60.00)
Glucose, Bld: 91 mg/dL (ref 70–99)
Potassium: 4.2 mEq/L (ref 3.5–5.1)
Sodium: 140 mEq/L (ref 135–145)

## 2016-11-25 LAB — URINALYSIS, ROUTINE W REFLEX MICROSCOPIC
Bilirubin Urine: NEGATIVE
Ketones, ur: NEGATIVE
Leukocytes, UA: NEGATIVE
Nitrite: NEGATIVE
Specific Gravity, Urine: 1.01 (ref 1.000–1.030)
Total Protein, Urine: NEGATIVE
Urine Glucose: NEGATIVE
Urobilinogen, UA: 0.2 (ref 0.0–1.0)
pH: 5.5 (ref 5.0–8.0)

## 2016-11-25 MED ORDER — TOLTERODINE TARTRATE ER 2 MG PO CP24: 2.0000 mg | ORAL_CAPSULE | Freq: Every day | ORAL | 1 refills | Status: DC

## 2016-11-25 MED ORDER — DICLOFENAC SODIUM 1 % TD GEL: 2.0000 g | Freq: Four times a day (QID) | TRANSDERMAL | 2 refills | Status: DC

## 2016-11-25 NOTE — Progress Notes (Signed)
Pre visit review using our clinic review tool, if applicable. No additional management support is needed unless otherwise documented below in the visit note. 

## 2016-11-25 NOTE — Progress Notes (Signed)
HPI:   Melanie [name] is a 75 y.o. female, who is here today to follow on some of her chronic medical problems.  Hypertension: Currently she is on Cozaar 50 mg daily  Reporting no side effects from medication.  She denies any frequent/severe headache, visual changes, chest pain, dyspnea, palpitation, abdominal pain, nausea, vomiting, or edema.  Lab Results  Component Value Date   CREATININE 0.83 04/14/2016   BUN 16 04/14/2016   NA 138 04/14/2016   K 4.7 04/14/2016   CL 98 04/14/2016   CO2 30 04/14/2016   Peripheral neuropathy: Numbness and burning feet, attributed to chemo medication to treat rectal cancer about 17-18 years ago; problem stable overall.  She is currently she is on Gabapentin 100 mg, which she started taken since mid 08/2016. Mentions having some side effects: fatigue, cough spells ("attacks"), "light headache" frontal and mainly in the morning. Also leg cramps. She has had all these symptoms before she started Gabapentin, we have discussed some; she states that they seem worse with Gabapentin.  Headaches better after drinking coffee, occasionally she takes Tylenol.  Non productive cough, usually after taking Gabapentin at night.  Hx of allergies. No heartburn, Hx of GERD , taking Omeprazole.  +Panick attacks.  She has tried Cymbalta and also side effects. She is currently on Ativan 0.5 mg at night.  Hx of urinary frequency and urgency incontinence, she takes Detrol 4 mg at night. She states that medication is helping with symptoms. She used to follow annually with urologists but due to cost she continued with gynecologists. She remembers having renal/bladder U/S and cystoscopy. Occasionally she has "tiny bit" of burning sensation with voiding. She denies gross hematuria or decreased urine output. Denies vaginal pruritus,bleeding, or discharge.  Chronic back pain: With radicular pain: Shorting pain, "electrivity", from distal to right lower  extremity to lower back.  Last OV, she was referred to ortho, since her last OV she received back injections "on both sides." She tells me that felt like medication was running to legs, pain "so bad", but a few days later she noted "little" improvement in pain but not as good as she did when she received injections in the past. She refused another injection or surgical treatment.     She mentions that last night she had an episode of nausea after dinner, she ate to eggs and grits. She states that she" felt bad", she sat for a few minutes and "prayed", symptoms completely resolved after a few minutes.    Denies abdominal pain, vomiting, changes in bowel habits, blood in stool or melena.    Review of Systems  Constitutional: Positive for appetite change and fatigue. Negative for activity change, fever and unexpected weight change.  HENT: Negative for mouth sores, nosebleeds and trouble swallowing.   Eyes: Negative for pain and visual disturbance.  Respiratory: Positive for cough. Negative for shortness of breath and wheezing.   Cardiovascular: Negative for chest pain, palpitations and leg swelling.  Gastrointestinal: Negative for abdominal pain and vomiting.       Negative for changes in bowel habits.  Genitourinary: Negative for decreased urine volume, difficulty urinating and hematuria.  Musculoskeletal: Positive for arthralgias and back pain. Negative for gait problem.  Allergic/Immunologic: Positive for environmental allergies.  Neurological: Positive for numbness. Negative for syncope, weakness and headaches.  Psychiatric/Behavioral: Positive for sleep disturbance (aggravated by pain). Negative for confusion and suicidal ideas. The patient is nervous/anxious.       Current Outpatient  Prescriptions on File Prior to Visit  Medication Sig Dispense Refill  . acetaminophen (TYLENOL) 650 MG CR tablet Take 650 mg by mouth every 8 (eight) hours as needed.    . cetirizine (ZYRTEC) 10  MG tablet Take 10 mg by mouth daily as needed for allergies.    Marland Kitchen loperamide (IMODIUM) 2 MG capsule Take 2 mg by mouth as needed.    Marland Kitchen LORazepam (ATIVAN) 0.5 MG tablet Take 1 tablet (0.5 mg total) by mouth at bedtime as needed. 30 tablet 2  . losartan (COZAAR) 50 MG tablet Take 1 tablet (50 mg total) by mouth daily. 90 tablet 1  . omeprazole (PRILOSEC) 20 MG capsule Take 20 mg by mouth daily.    . Red Yeast Rice 600 MG CAPS Take 600 mg by mouth 2 (two) times daily.    . Zinc 50 MG TABS Take 50 mg by mouth daily.     No current facility-administered medications on file prior to visit.      Past Medical History:  Diagnosis Date  . Anxiety   . Cancer ([place])    rectal ca  . Thyroid disease    Allergies  Allergen Reactions  . Latex Rash  . Macrodantin Shortness Of Breath  . Guaifenesin Er Itching  . Morphine And Related Nausea Only  . Lipitor [Atorvastatin]     rash  . Lisinopril     Diarrhea/cough  . Pravastatin     Leg cramps  . Zoloft [Sertraline Hcl]     Diarrhea    Social History   Social History  . Marital status: Married    Spouse name: N/A  . Number of children: N/A  . Years of education: N/A   Social History Main Topics  . Smoking status: Former Smoker    Types: Cigarettes  . Smokeless tobacco: Never Used  . Alcohol use No  . Drug use: No  . Sexual activity: Not Asked   Other Topics Concern  . None   Social History Narrative  . None    Vitals:   11/25/16 0838  BP: 130/80  Pulse: 86  Resp: 12   O2 sat from 94%-96%  Body mass index is 21.76 kg/m.      Physical Exam  Nursing note and vitals reviewed. Constitutional: She is oriented to person, place, and time. She appears well-developed and well-nourished. No distress.  HENT:  Head: Atraumatic.  Mouth/Throat: Oropharynx is clear and moist and mucous membranes are normal. She has dentures.  Eyes: Conjunctivae and EOM are normal. Pupils are equal, round, and reactive to light.  Neck: No  tracheal deviation present. Thyromegaly present. No thyroid mass present.  Cardiovascular: Normal rate and regular rhythm.   Murmur (soft SEM RUSB) heard. Pulses:      Dorsalis pedis pulses are 2+ on the right side, and 2+ on the left side.  Respiratory: Effort normal and breath sounds normal. No respiratory distress.  GI: Soft. She exhibits no mass. There is no tenderness.  Musculoskeletal: She exhibits no edema.  No pain upon rotation of paraspinal muscles: Thoracic and lumbar, bilateral. Decrease ROM (extension and flexion) of knees, L>R, no pain elicited.  Shoulders range of motion adequate, no pain with movement.  Neurological: She is alert and oriented to person, place, and time. She has normal strength. Coordination normal.  SLR negative bilateral. Stable,slow gait with no assistance.  Skin: Skin is warm. No erythema.  Psychiatric: Her mood appears anxious.  Well groomed, good eye contact.  ASSESSMENT AND PLAN:     Megahn was seen today for follow-up.  Diagnoses and all orders for this visit:   Lab Results  Component Value Date   CREATININE 0.82 11/25/2016   BUN 16 11/25/2016   NA 140 11/25/2016   K 4.2 11/25/2016   CL 100 11/25/2016   CO2 30 11/25/2016    Lumbar back pain with radiculopathy affecting right lower extremity  We reviewed treatment options again, she does not feel like Gabapentin has helped, so will discontinued. She already tried Cymbalta and did not tolerated well. Lyrica is expensive ans also afraid of having side effects. Topical Voltaren may help.   Peripheral neuropathy due to chemotherapy [facility])  She has not tolerated treatment options. Fall precautions and foot care discussed.  -     diclofenac sodium (VOLTAREN) 1 % GEL; Apply 2 g topically 4 (four) times daily.  Other specified anxiety disorders  Not well controlled. She is not interested in trying SSRI's or other anxiolytic medication, prefers to continue with Ativan. We  discussed some side effects.   Cough  We discussed possible causes:allergies,GERD, s/p URI among some. She is certain it is due to Gabapentin, so we will monitor for changes after stopping medication. I do not think imaging is needed today but will consider if cough is persistent.   Overactive bladder  We discussed some side effects of Detrol. Medication seems to be helping with symptoms, she agrees with trying a lower dose.  -     tolterodine (DETROL LA) 2 MG 24 hr capsule; Take 1 capsule (2 mg total) by mouth daily. -     Urinalysis, Routine w reflex microscopic  Essential hypertension  Adequately controlled. No changes in current management. Low salt diet recommended. Periodic eye exam. F/U in 6 months, before if needed.  -     Basic metabolic panel      Face to face OV from 9:02 am-9:41Am, > 50% of time dedicated to discussion of medications side effects, treatment options for some of her chronic problems, and safety.     -Ms. Channelle L Bunker was advised to return sooner than planned today if new concerns arise.       Betty G. Martinique, MD  [facility]. [place] office.

## 2016-11-25 NOTE — Patient Instructions (Addendum)
A few things to remember from today's visit:   Lumbar back pain with radiculopathy affecting right lower extremity  Peripheral neuropathy due to chemotherapy (HCC) - Plan: diclofenac sodium (VOLTAREN) 1 % GEL  Other specified anxiety disorders  Cough   Medicare covers a annual preventive visit, which is strongly recommended , it is once per year and involves a series of questions to identify risk factors; so we can try to prevent possible complications. This does not need to be done by a doctor.  We have a nurse [employer]) here that is highly qualified to do it, it can be arrange same date you have a follow up appointment with me or labs scheduled, and it 100% covered by Medicare. So before you leave today I would like for you to arrange visit with Melanie [name] for Medicare wellness visit.   Acid reflux could be causing cough.  Avoid foods that make your symptoms worse, for example coffee, chocolate,pepermeint,alcohol, and greasy food. Raising the head of your bed about 6 inches may help with nocturnal symptoms.   Avoid lying down for 3 hours after eating.  Instead 3 large meals daily try small and more frequent meals during the day.  Some medications we recommend for acid reflux treatment (proton pump inhibitors) can cause some problems in the long term: increase risk of osteoporosis, vitamin deficiencies,pneumonia, and more recently discovered that it can increase the risk of chronic kidney disease and might increase risk of dementia.  You should be evaluated immediately if bloody vomiting, bloody stools, black stools (like tar), difficulty swallowing, food gets stuck on the way down or choking when eating. Abnormal weight loss or severe abdominal pain.  Today Detrol decreased.   Please be sure medication list is accurate. If a new problem present, please set up appointment sooner than planned today.

## 2016-12-25 ENCOUNTER — Other Ambulatory Visit: Payer: Self-pay | Admitting: Family Medicine

## 2016-12-25 DIAGNOSIS — F419 Anxiety disorder, unspecified: Secondary | ICD-10-CM

## 2016-12-28 NOTE — Telephone Encounter (Signed)
Okay to refill? 

## 2016-12-29 NOTE — Telephone Encounter (Signed)
Rx for Lorazepam 0.5 mg to continue at bedtime as needed can be called in, # 30/3. Thanks, BJ

## 2016-12-30 NOTE — Telephone Encounter (Signed)
Rx called in 

## 2017-01-17 ENCOUNTER — Other Ambulatory Visit: Payer: Self-pay

## 2017-01-17 DIAGNOSIS — N3281 Overactive bladder: Secondary | ICD-10-CM

## 2017-01-17 MED ORDER — TOLTERODINE TARTRATE ER 2 MG PO CP24: 2.0000 mg | ORAL_CAPSULE | Freq: Every day | ORAL | 1 refills | Status: DC

## 2017-01-25 ENCOUNTER — Other Ambulatory Visit: Payer: Self-pay | Admitting: Family Medicine

## 2017-01-25 DIAGNOSIS — I10 Essential (primary) hypertension: Secondary | ICD-10-CM

## 2017-03-07 NOTE — Progress Notes (Addendum)
HPI:   [name] is a 76 y.o. female, who is here today to follow on "medications."  Hx of anxiety,chronic back pain with radiculopathy,and peripheral neuropathy after chemo. Pain was mainly radiated to RLE but according to pt,after last epidural "shot" she has had pain on both LE's.Electricity like pain, severe, alleviated with sitting and exacerbated by standing up or walking. Lower back pain "is not bad."  She denies saddle anesthesia or changes in urine/bowel continence.   She has tried Gabapentin and Cymbalta,did not tolerate these meds well.She could not afford Lyrica. Cymbalta 20 mg caused nausea, anxiety "attacks", "jittery" sensation. She took Gabapentin for 2 months, in the morning,deneis feeling drowsy, she is not sure if it helped but would like to try again.  OA, involving mainly hands (IP), stiffness in the morning. Right middle finger she cannot extend when she first gets up but improved with running warm water on hands. No edema or erythema. She takes Tylenol for pain as needed.  Voltaren gel did not help.    -Anxiety,she is on Lorazepam 0.5 mg, 1/2-1 tab as needed ,usually at bedtime. She has taken medication for 20+ years. Denies suicidal thoughts or depressed mood.   Still tolerating medication well,denies side effects.  -HTN: She also brings BP readings: 120's-130's/60's. A couple SBP in the low 140's and DBP in the 50's. Denies severe/frequent headache, visual changes, chest pain, dyspnea, palpitation, claudication, focal weakness, or edema. She takes Cozaar 50 mg daily.    Lab Results  Component Value Date   CREATININE 0.82 11/25/2016   BUN 16 11/25/2016   NA 140 11/25/2016   K 4.2 11/25/2016   CL 100 11/25/2016   CO2 30 11/25/2016     Review of Systems  Constitutional: Positive for fatigue (no more than usual). Negative for activity change, appetite change and fever.  HENT: Negative for mouth sores, nosebleeds and trouble  swallowing.   Eyes: Negative for pain and visual disturbance.  Respiratory: Negative for cough, shortness of breath and wheezing.   Cardiovascular: Negative for chest pain, palpitations and leg swelling.  Gastrointestinal: Negative for abdominal pain, nausea and vomiting.       Negative for changes in bowel habits.  Genitourinary: Negative for decreased urine volume and hematuria.  Musculoskeletal: Positive for arthralgias and back pain. Negative for gait problem.  Skin: Negative for rash.  Neurological: Negative for syncope, weakness and headaches.  Psychiatric/Behavioral: Negative for confusion. The patient is nervous/anxious.       Current Outpatient Prescriptions on File Prior to Visit  Medication Sig Dispense Refill  . acetaminophen (TYLENOL) 650 MG CR tablet Take 650 mg by mouth every 8 (eight) hours as needed.    . cetirizine (ZYRTEC) 10 MG tablet Take 10 mg by mouth daily as needed for allergies.    Marland Kitchen loperamide (IMODIUM) 2 MG capsule Take 2 mg by mouth as needed.    Marland Kitchen LORazepam (ATIVAN) 0.5 MG tablet TAKE 1 TABLET BY MOUTH AT BEDTIME AS NEEDED 30 tablet 3  . losartan (COZAAR) 50 MG tablet TAKE 1 TABLET(50 MG) BY MOUTH DAILY 90 tablet 1  . omeprazole (PRILOSEC) 20 MG capsule Take 20 mg by mouth daily.    . Red Yeast Rice 600 MG CAPS Take 600 mg by mouth 2 (two) times daily.    Marland Kitchen tolterodine (DETROL LA) 2 MG 24 hr capsule Take 1 capsule (2 mg total) by mouth daily. 90 capsule 1   No current facility-administered medications on file  prior to visit.      Past Medical History:  Diagnosis Date  . Anxiety   . Cancer ([place])    rectal ca  . Thyroid disease    Allergies  Allergen Reactions  . Latex Rash  . Macrodantin Shortness Of Breath  . Guaifenesin Er Itching  . Morphine And Related Nausea Only  . Lipitor [Atorvastatin]     rash  . Lisinopril     Diarrhea/cough  . Pravastatin     Leg cramps  . Zoloft [Sertraline Hcl]     Diarrhea    Social History   Social  History  . Marital status: Married    Spouse name: N/A  . Number of children: N/A  . Years of education: N/A   Social History Main Topics  . Smoking status: Former Smoker    Types: Cigarettes  . Smokeless tobacco: Never Used  . Alcohol use No  . Drug use: No  . Sexual activity: Not Asked   Other Topics Concern  . None   Social History Narrative  . None    Vitals:   03/08/17 1104  BP: 130/70  Pulse: 81  Resp: 12   Body mass index is 21.78 kg/m.   Physical Exam  Nursing note and vitals reviewed. Constitutional: She is oriented to person, place, and time. She appears well-developed and well-nourished. No distress.  HENT:  Head: Atraumatic.  Mouth/Throat: Oropharynx is clear and moist and mucous membranes are normal. She has dentures.  Eyes: Conjunctivae and EOM are normal.  Cardiovascular: Normal rate and regular rhythm.   Murmur (SEM I/VI  RUSB) heard. Pulses:      Dorsalis pedis pulses are 2+ on the right side, and 2+ on the left side.  Respiratory: Effort normal and breath sounds normal. No respiratory distress.  GI: Soft. She exhibits no mass. There is no hepatomegaly. There is no tenderness.  Musculoskeletal: She exhibits no edema or tenderness.  Shoulder mild limitation of active abduction, no pain elicited. Hands with nodular deformities DIP and some PIP. Middle finger R>L mild clicking sensation upon extension, no limitation of ROM. Thick tendons appreciated, no contractures. No tenderness upon palpation of lumbar paraspinal muscles,bilateral SLR negative bilateral. No signs of synovitis.  Lymphadenopathy:    She has no cervical adenopathy.  Neurological: She is alert and oriented to person, place, and time. She has normal strength. Coordination normal.  Stable gait with no assistance.  Skin: Skin is warm. No erythema.  Psychiatric: Her speech is normal. Her mood appears anxious.  Well groomed, good eye contact.    ASSESSMENT AND PLAN:   Melanie [name] was  seen today for follow-up.  Diagnoses and all orders for this visit:  Peripheral neuropathy due to chemotherapy ([place])  Stable. She did not try Cymbalta in the past. She would like to try Gabapentin again,start with 100 mg and we can titrate up as tolerated. Some side effects reviewed. We could try Cymbalta again. She was instructed to let us know how she is doing with Gabapentin in about 4-6 weeks. F/U in 3-4 months.  -     gabapentin (NEURONTIN) 100 MG capsule; Take 1 capsule (100 mg total) by mouth daily.  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 Gabapentin may help. Continue tylenol as needed, max 2600 mg daily. Fall precautions.  -     gabapentin (NEURONTIN) 100 MG capsule; Take 1 capsule (100 mg total) by mouth daily.  Essential hypertension  Adequately controlled. No changes in current management. DASH diet recommended.  F/U in 4 months, before if needed.  Other specified anxiety disorders  Stable. No changes in current management. Some side effects of benzodiazepines discussed.  F/U in 4 months.   -Melanie [name] was advised to return sooner than planned today if new concerns arise.       Betty G. Martinique, MD  [facility]. [place] office.

## 2017-03-08 ENCOUNTER — Ambulatory Visit: Payer: Medicare Other | Admitting: Family Medicine

## 2017-03-08 ENCOUNTER — Encounter: Payer: Self-pay | Admitting: Family Medicine

## 2017-03-08 ENCOUNTER — Ambulatory Visit (INDEPENDENT_AMBULATORY_CARE_PROVIDER_SITE_OTHER): Payer: Medicare Other | Admitting: Family Medicine

## 2017-03-08 VITALS — BP 130/70 | HR 81 | Resp 12 | Ht 68.0 in | Wt 143.2 lb

## 2017-03-08 DIAGNOSIS — F418 Other specified anxiety disorders: Secondary | ICD-10-CM

## 2017-03-08 DIAGNOSIS — G620 Drug-induced polyneuropathy: Secondary | ICD-10-CM | POA: Diagnosis not present

## 2017-03-08 DIAGNOSIS — I10 Essential (primary) hypertension: Secondary | ICD-10-CM | POA: Diagnosis not present

## 2017-03-08 DIAGNOSIS — M5417 Radiculopathy, lumbosacral region: Secondary | ICD-10-CM

## 2017-03-08 DIAGNOSIS — M5416 Radiculopathy, lumbar region: Secondary | ICD-10-CM

## 2017-03-08 DIAGNOSIS — T451X5A Adverse effect of antineoplastic and immunosuppressive drugs, initial encounter: Secondary | ICD-10-CM | POA: Diagnosis not present

## 2017-03-08 MED ORDER — GABAPENTIN 100 MG PO CAPS
100.0000 mg | ORAL_CAPSULE | Freq: Every day | ORAL | 3 refills | Status: DC
Start: 2017-03-08 — End: 2017-04-07

## 2017-03-08 NOTE — Patient Instructions (Addendum)
A few things to remember from today's visit:   Peripheral neuropathy due to chemotherapy ([place]) - Plan: gabapentin (NEURONTIN) 100 MG capsule  Lumbar back pain with radiculopathy affecting right lower extremity - Plan: gabapentin (NEURONTIN) 100 MG capsule  Other specified anxiety disorders  Essential hypertension  In about 4-6 weeks please let me know through My Chart or by calling the office about tolerance of new medication.   Blood pressure is good, no changes.  Caution with falls.   Please be sure medication list is accurate. If a new problem present, please set up appointment sooner than planned today.

## 2017-03-08 NOTE — Progress Notes (Signed)
Pre visit review using our clinic review tool, if applicable. No additional management support is needed unless otherwise documented below in the visit note. 

## 2017-04-07 ENCOUNTER — Telehealth: Payer: Self-pay

## 2017-04-07 DIAGNOSIS — G620 Drug-induced polyneuropathy: Secondary | ICD-10-CM

## 2017-04-07 DIAGNOSIS — T451X5A Adverse effect of antineoplastic and immunosuppressive drugs, initial encounter: Secondary | ICD-10-CM

## 2017-04-07 DIAGNOSIS — M5416 Radiculopathy, lumbar region: Secondary | ICD-10-CM

## 2017-04-07 MED ORDER — GABAPENTIN 100 MG PO CAPS: 100.0000 mg | ORAL_CAPSULE | Freq: Two times a day (BID) | ORAL | 3 refills | Status: DC

## 2017-04-07 NOTE — Telephone Encounter (Signed)
Received a voicemail from patient. Returned phone call. She said that the gabapentin is working pretty good. She is wanting to know if can start taking it twice a day because it usually wears off about 3pm and her feet start hurting again.

## 2017-04-07 NOTE — Telephone Encounter (Signed)
Yes, she can take Gabapentin 100 mg bid, caution with falls because can cause drowsiness.  Thanks, BJ

## 2017-04-07 NOTE — Telephone Encounter (Signed)
Rx sent- patient aware.  

## 2017-04-28 ENCOUNTER — Telehealth: Payer: Self-pay

## 2017-04-28 NOTE — Telephone Encounter (Signed)
Called and spoke with patient, advised she can take Tylenol & Gabapentin together. Patient verbalized understanding.

## 2017-04-28 NOTE — Telephone Encounter (Signed)
Tried to reach patient, line keeps ringing and doesn't pick up or go to voicemail.

## 2017-04-28 NOTE — Telephone Encounter (Signed)
I received voicemail from patient. She states she would like a call back from Judson Roch - she wants to know if Dr. Martinique thinks it is okay for her to take Tylenol and Gabapentin together? Please advise.

## 2017-04-28 NOTE — Telephone Encounter (Signed)
Okay to take them both?

## 2017-04-28 NOTE — Telephone Encounter (Signed)
It is Ok to take Tylenol 650 mg tid and Gabapentin current dose. No risk of significant interaction.  Thanks, BJ

## 2017-04-29 ENCOUNTER — Other Ambulatory Visit: Payer: Self-pay | Admitting: Family Medicine

## 2017-04-29 DIAGNOSIS — F419 Anxiety disorder, unspecified: Secondary | ICD-10-CM

## 2017-05-08 NOTE — Telephone Encounter (Signed)
Rx for Lorazepam 0.5 mg can be called in to continue 1/2-1 tab daily as needed. #30/3. [Last filled 04/01/17 # 30 according to [place] controlled subs report) Thanks, BJ

## 2017-05-09 NOTE — Telephone Encounter (Signed)
Rx phoned in.   

## 2017-06-14 ENCOUNTER — Other Ambulatory Visit: Payer: Self-pay | Admitting: Family Medicine

## 2017-06-14 DIAGNOSIS — N3281 Overactive bladder: Secondary | ICD-10-CM

## 2017-06-14 MED ORDER — TOLTERODINE TARTRATE ER 2 MG PO CP24: 2.0000 mg | ORAL_CAPSULE | Freq: Every day | ORAL | 2 refills | Status: DC

## 2017-07-24 ENCOUNTER — Other Ambulatory Visit: Payer: Self-pay | Admitting: Family Medicine

## 2017-07-24 DIAGNOSIS — I10 Essential (primary) hypertension: Secondary | ICD-10-CM

## 2017-07-28 ENCOUNTER — Other Ambulatory Visit: Payer: Self-pay | Admitting: Family Medicine

## 2017-07-28 DIAGNOSIS — M5416 Radiculopathy, lumbar region: Secondary | ICD-10-CM

## 2017-07-28 DIAGNOSIS — G620 Drug-induced polyneuropathy: Secondary | ICD-10-CM

## 2017-07-28 DIAGNOSIS — T451X5A Adverse effect of antineoplastic and immunosuppressive drugs, initial encounter: Secondary | ICD-10-CM

## 2017-09-08 ENCOUNTER — Ambulatory Visit: Payer: Medicare Other | Admitting: Family Medicine

## 2017-09-08 ENCOUNTER — Other Ambulatory Visit: Payer: Self-pay | Admitting: Family Medicine

## 2017-09-08 DIAGNOSIS — F419 Anxiety disorder, unspecified: Secondary | ICD-10-CM

## 2017-09-08 NOTE — Telephone Encounter (Signed)
Rx for Lorazepam 0.5 mg can be called in to continue up to one tab daily as needed. #30/2.  Thanks, BJ

## 2017-09-08 NOTE — Telephone Encounter (Signed)
Rx last filled 08/09/17. Next appt is 09/12/17.  Okay to refill?

## 2017-09-08 NOTE — Telephone Encounter (Signed)
Rx phoned in.   

## 2017-09-11 NOTE — Progress Notes (Signed)
HPI:   Melanie [name] is a 76 y.o. female, who is here today for 6 months follow up. She is also due for her CPE and Medicare visit. According to pt, she has received phone calls from her insurance recommending setting up appt.    She was last seen on 03/08/17.  Hx of HTN,HLD, thyromegaly, GERD,generalized OA, lower back pain with radiculopathy, rectal cancer, overactive bladder,anxiety,Hx of skin cancer (she has not followed with dermatologists), and hearing loss among some.  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 Irregular bowel habits, intermittent diarrhea. Sometimes she takes up to 5 Imodium per day.   She lives with her husband. Independent for most ADL's and IADL's. She has mild unstable gait at times, she does not use a cane or walker. + Hearing loss, she does not wear her hearing aids.  No falls in the past year and denies depression symptoms.   Functional Status Survey: Is the patient deaf or have difficulty hearing?: Yes (She does not like hearing aids.) Does the patient have difficulty seeing, even when wearing glasses/contacts?: No Does the patient have difficulty concentrating, remembering, or making decisions?: No (Sometimes she cannot remember something right away but she does later.) Does the patient have difficulty walking or climbing stairs?: Yes (Hx of peripheral neuropathy and OA) Does the patient have difficulty dressing or bathing?: No Does the patient have difficulty doing errands alone such as visiting a doctor's office or shopping?: No  Fall Risk  09/12/2017 04/14/2016  Falls in the past year? No -  Number falls in past yr: - 1  Injury with Fall? - No  Risk for fall due to : Impaired balance/gait -  Follow up - Education provided    Anxiety:  She is on Lorazepam 0.5 mg 1/2-1 tab daily as needed.  She did not  tolerate Cymbalta. Lorazepam also helps her sleep.  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 Exacerbated by prolonged standing or walking. Alleviated by sitting. She has had epidural injections in the past for radicular pain, helped initially but last one did not help.   She is currently on Gabapentin 100 mg bid. Gabapentin Am dose helps for 3-4 hours but the one in the afternoon does not seem to help much. Legs "hurt bad."  She states that her lower back is mild but lateral thigh pain is moderate to severe. She denies saddle anesthesia or urine/bowel incontinence.   HTN: She is on Cozaar 50 mg daily. Last eye exam: a year ago.  Denies headache, visual changes, chest pain, dyspnea, palpitation, focal weakness, or edema.  Providers she sees regularly:  Eye care provider: Dr [facility], Dr Benay Spice, prn.   Mammogram: 06/2016 [facility] 1 DEXA: Many years ago. She takes Ca++ supplementation. She does not tolerate Vit D, it aggravates diarrhea.  She does not want more colonoscopies done, states that her last one was painful.        Mini-Cog - 09/12/17 1045    Normal clock drawing test? yes   How many words correct? 2      Hearing Screening   125Hz  250Hz  500Hz  1000Hz  2000Hz  3000Hz  4000Hz  6000Hz  8000Hz   Right ear:   [place]  Left ear:   Fail Pass Pass Fail       Visual Acuity Screening   Right eye Left eye Both eyes  Without correction:     With correction:  20/40 20/40      Review of Systems  Constitutional: Positive for fatigue (No more than usual). Negative for appetite change and fever.  HENT: Negative for hearing loss, mouth sores, sore throat, trouble swallowing and voice change.   Eyes: Negative for redness and visual disturbance.  Respiratory: Negative for  cough, shortness of breath and wheezing.   Cardiovascular: Negative for chest pain, palpitations and leg swelling.  Gastrointestinal: Positive for diarrhea. Negative for abdominal pain, blood in stool, nausea and vomiting.       No changes in bowel habits.  Endocrine: Negative for cold intolerance, heat intolerance, polydipsia, polyphagia and polyuria.  Genitourinary: Negative for decreased urine volume, dysuria and hematuria.  Musculoskeletal: Positive for arthralgias, back pain, gait problem and myalgias.  Skin: Negative for pallor and rash.  Allergic/Immunologic: Positive for environmental allergies.  Neurological: Negative for syncope, weakness and headaches.  Psychiatric/Behavioral: Negative for confusion, hallucinations, sleep disturbance and suicidal ideas. The patient is nervous/anxious.   All other systems reviewed and are negative.     Current Outpatient Prescriptions on File Prior to Visit  Medication Sig Dispense Refill  . acetaminophen (TYLENOL) 650 MG CR tablet Take 650 mg by mouth every 8 (eight) hours as needed.    . cetirizine (ZYRTEC) 10 MG tablet Take 10 mg by mouth daily as needed for allergies.    Marland Kitchen loperamide (IMODIUM) 2 MG capsule Take 2 mg by mouth as needed.    Marland Kitchen LORazepam (ATIVAN) 0.5 MG tablet TAKE 1/2-1 TABLET BY MOUTH EVERY NIGHT AT BEDTIME AS NEEDED 30 tablet 2  . losartan (COZAAR) 50 MG tablet TAKE 1 TABLET(50 MG) BY MOUTH DAILY 90 tablet 1  . omeprazole (PRILOSEC) 20 MG capsule Take 20 mg by mouth daily.    . Red Yeast Rice 600 MG CAPS Take 600 mg by mouth 2 (two) times daily.    Marland Kitchen tolterodine (DETROL LA) 2 MG 24 hr capsule Take 1 capsule (2 mg total) by mouth daily. 90 capsule 2   No current facility-administered medications on file prior to visit.      Past Medical History:  Diagnosis Date  . Anxiety   . Cancer ([place])    rectal ca  . Thyroid disease    Past Surgical History:  Procedure Laterality Date  . ABDOMINAL HYSTERECTOMY    . COLON  SURGERY      Allergies  Allergen Reactions  . Latex Rash  . Macrodantin Shortness Of Breath  . Guaifenesin Er Itching  . Morphine And Related Nausea Only  . Lipitor [Atorvastatin]     rash  . Lisinopril     Diarrhea/cough  . Pravastatin     Leg cramps  . Zoloft [Sertraline Hcl]     Diarrhea   Family History  Problem Relation Age of Onset  . Cancer Sister        Lung cancer     Social History   Social History  . Marital status: Married    Spouse name: N/A  . Number of children: N/A  . Years of education: N/A   Social History Main Topics  . Smoking status: Former Smoker    Types: Cigarettes  . Smokeless tobacco: Never Used  . Alcohol use No  . Drug use: No  . Sexual activity: Not Asked  Other Topics Concern  . None   Social History Narrative  . None    Vitals:   09/12/17 1020  BP: 136/80  Pulse: 82  Resp: 12  SpO2: 95%   Body mass index is 21.67 kg/m.   Physical Exam  Nursing note and vitals reviewed. Constitutional: She is oriented to person, place, and time. She appears well-developed and well-nourished. No distress.  HENT:  Head: Normocephalic and atraumatic.  Right Ear: Hearing, tympanic membrane, external ear and ear canal normal.  Left Ear: Hearing, tympanic membrane, external ear and ear canal normal.  Nose: Septal deviation present.  Mouth/Throat: Uvula is midline, oropharynx is clear and moist and mucous membranes are normal. She has dentures.  Eyes: Pupils are equal, round, and reactive to light. Conjunctivae and EOM are normal.  Neck: No tracheal deviation present. Thyromegaly present.  Cardiovascular: Normal rate and regular rhythm.   No murmur heard. DP pulses present bilateral.  Respiratory: Effort normal and breath sounds normal. No respiratory distress.  GI: Soft. She exhibits no mass. There is no hepatomegaly. There is no tenderness.  Musculoskeletal: She exhibits no edema.  No signs of synovitis appreciated. Heberden's node  (DIP) and Bouchard's nodes (PIP) bilateral.  Lymphadenopathy:    She has no cervical adenopathy.       Right: No supraclavicular adenopathy present.       Left: No supraclavicular adenopathy present.  Neurological: She is alert and oriented to person, place, and time. She has normal strength. No cranial nerve deficit. Gait abnormal. Coordination normal.  Reflex Scores:      Bicep reflexes are 2+ on the right side and 2+ on the left side.      Patellar reflexes are 2+ on the right side and 2+ on the left side. Mildly unstable gait, not assisted. Get up and go test > 15 seconds.   Skin: Skin is warm. Lesion noted. No rash noted. No erythema.  4-5 mm raised lesion with telangiectasias on forehead. No tender. Other skin lesions hyperpigmented,regular borders,thick scattered on back and chest (SK)  Psychiatric: Her mood appears anxious.  Well groomed, good eye contact.     ASSESSMENT AND PLAN:   Ms. Kathlen Sakurai Yasuda was seen today for follow-up and annual exam.  Diagnoses and all orders for this visit:  Medicare annual wellness visit, subsequent  We discussed the importance of staying active, physically and mentally, as well as the benefits of a healthy/balnace diet. Low impact exercise that involve stretching and strengthing are ideal.  We discussed preventive screening for the next 5-10 years, summery of recommendations given in AVS:  Eye examination annually to screen for glaucoma. Diabetes screening. Mammogram q 1-2 years. DEXA Fall prevention. Influenza vaccine annually.  Advance directives and end of life discussed, she doe snot have POA or living will. Web sire information given.   Routine general medical examination at a health care facility  We discussed the importance of regular physical activity and healthy diet for prevention of chronic illness and/or complications. Preventive guidelines reviewed. Vaccination reported as up to date. Influenza vaccine given  today.  Ca++ supplementation to continue. She does not tolerate Vit D. Next CPE in 1 year.  Essential hypertension  Adequately controlled. No changes in current management. DASH diet recommended. Eye exam recommended annually. F/U in 6 months, before if needed.  -     Basic metabolic panel  Other specified anxiety disorders  Stable. No changes in current management.Side effects of Lorazepam discussed. F/U in 3-4 months.  Lumbar back pain with radiculopathy affecting right lower extremity  Still very symptomatic. Gabapentin am dose increased from 100 mg to 200 mg. No changes in pm dose. Side effects discussed. F/U in 3 months.  -     gabapentin (NEURONTIN) 100 MG capsule; 2 caps in the morning and continue 1 cap in the afternoon.  Peripheral neuropathy due to chemotherapy (HCC)  Gabapentin increased. Fall precautions. Skin/foot care.  -     gabapentin (NEURONTIN) 100 MG capsule; 2 caps in the morning and continue 1 cap in the afternoon.  Need for influenza vaccination -     Flu vaccine HIGH DOSE PF  Skin lesion of face  Lesion on forehead could be BCC. Recommend arranging appt with her dermatologists, she doe sot think she needs a referral. Her husband is following with same dermatologists.         -Ms. Alvetta L Wahlquist was advised to return sooner than planned today if new concerns arise.       Dwan Fennel G. Martinique, MD  [facility]. [place] office.

## 2017-09-12 ENCOUNTER — Encounter: Payer: Self-pay | Admitting: Family Medicine

## 2017-09-12 ENCOUNTER — Ambulatory Visit (INDEPENDENT_AMBULATORY_CARE_PROVIDER_SITE_OTHER): Payer: Medicare Other | Admitting: Family Medicine

## 2017-09-12 VITALS — BP 136/80 | HR 82 | Resp 12 | Ht 68.0 in | Wt 142.5 lb

## 2017-09-12 DIAGNOSIS — M5417 Radiculopathy, lumbosacral region: Secondary | ICD-10-CM | POA: Diagnosis not present

## 2017-09-12 DIAGNOSIS — Z23 Encounter for immunization: Secondary | ICD-10-CM

## 2017-09-12 DIAGNOSIS — G620 Drug-induced polyneuropathy: Secondary | ICD-10-CM | POA: Diagnosis not present

## 2017-09-12 DIAGNOSIS — I10 Essential (primary) hypertension: Secondary | ICD-10-CM

## 2017-09-12 DIAGNOSIS — L989 Disorder of the skin and subcutaneous tissue, unspecified: Secondary | ICD-10-CM

## 2017-09-12 DIAGNOSIS — F418 Other specified anxiety disorders: Secondary | ICD-10-CM | POA: Diagnosis not present

## 2017-09-12 DIAGNOSIS — Z0000 Encounter for general adult medical examination without abnormal findings: Secondary | ICD-10-CM

## 2017-09-12 DIAGNOSIS — T451X5A Adverse effect of antineoplastic and immunosuppressive drugs, initial encounter: Secondary | ICD-10-CM | POA: Diagnosis not present

## 2017-09-12 DIAGNOSIS — Z0001 Encounter for general adult medical examination with abnormal findings: Secondary | ICD-10-CM

## 2017-09-12 DIAGNOSIS — M5416 Radiculopathy, lumbar region: Secondary | ICD-10-CM

## 2017-09-12 MED ORDER — GABAPENTIN 100 MG PO CAPS: ORAL_CAPSULE | ORAL | 3 refills | Status: DC

## 2017-09-12 NOTE — Patient Instructions (Addendum)
A few things to remember from today's visit:   Medicare annual wellness visit, subsequent  Essential hypertension - Plan: Basic metabolic panel  Other specified anxiety disorders  Lumbar back pain with radiculopathy affecting right lower extremity - Plan: gabapentin (NEURONTIN) 100 MG capsule  Peripheral neuropathy due to chemotherapy ([place]) - Plan: gabapentin (NEURONTIN) 100 MG capsule  In the next 5-10 years you need:  Mammogram and bone density, both can be done next year as you requested. Flu vaccine annually. Eye exam annually to check eye pressure.  Fall precautions. Calcium 1200 mg, ideally through diet. Vit D 800 U daily.  Low impact exercise, Tai Chi is a good option.   Advance directives:  Please see a lawyer and/or go to this website to help you with advanced directives and designating a health care power of attorney so that your wishes will be followed should you become too ill to make your own medical decisions.  [email or web address]  memoryTips  Keep a journal/notebook with sections for the following (or use sections separately as needed):  Calendar and appointment sheet, schedule for each day, lists of reminders (such as grocery lists or "to do" list), homework assignments for therapy, important information such as family and friends names / addresses / phone numbers, medications, medical history and doctors name / phone numbers.  Avoid / remove clutter and unnecessary items from areas such as countertops / cabinets in kitchen and bathroom, closets, etc.  Organize items by purpose.  Baskets and bins help with this.  Leave notes for reminders above task to be completed.  For example: Note to turn off stove over the the stove; note to lock door beside the door, not to brush teeth then wash face by sink, note to take medication on table etc.  To help recall names of people of people or things, mentally or verbally go through the alphabet to try to  determine the 1st letter of the word as this may trigger the name or word you are looking for.  If this is too difficult and someone else knows the word, have them give you the first letter by asking, "does it start with an "A", "B", "C" etc. (or have them give you the first sound of the word or some other clue).  Review family events, occasions, names, etc.  Pictures are a good way to trigger memory.  Have others correct you if you answer something incorrectly.  Have them speak slowly with a few words to give you time to process and respond.  Don't let others automatically problem-solve. (For example: don't let them automatically lay out clothes in the correct position, but hand it to you folded so that you can figure it out for yourself.)  However, if you need help with tasks, they should give you as little as they can so that you can be successful.  If appropriate and safe, they may allow you to make mistakes so that you can figure out how to correct the error.  (For example, they may allow you to put your shoes on the wrong foot to see if you notice that is wrong).  If you struggle, they should give you a cue.  (Example: "Do your shoes feel right?"  "Do they look right?") Please be sure medication list is accurate. If a new problem present, please set up appointment sooner than planned today.

## 2017-09-28 ENCOUNTER — Other Ambulatory Visit: Payer: Self-pay | Admitting: Family Medicine

## 2017-09-28 DIAGNOSIS — Z1231 Encounter for screening mammogram for malignant neoplasm of breast: Secondary | ICD-10-CM

## 2017-10-18 ENCOUNTER — Ambulatory Visit
Admission: RE | Admit: 2017-10-18 | Discharge: 2017-10-18 | Disposition: A | Discharge status: Home / Self Care | Payer: Medicare Other | Source: Ambulatory Visit | Attending: Family Medicine | Admitting: Family Medicine

## 2017-10-18 DIAGNOSIS — Z1231 Encounter for screening mammogram for malignant neoplasm of breast: Secondary | ICD-10-CM | POA: Diagnosis not present

## 2017-11-17 ENCOUNTER — Other Ambulatory Visit: Payer: Self-pay | Admitting: Family Medicine

## 2017-11-17 DIAGNOSIS — N3281 Overactive bladder: Secondary | ICD-10-CM

## 2017-12-14 ENCOUNTER — Ambulatory Visit: Payer: Self-pay | Admitting: *Deleted

## 2017-12-14 NOTE — Telephone Encounter (Signed)
These are side effects of Gabapentin. Last visit Gabapentin was increased from 200 mg daily to 300 mg. She can decrease dose of Gabapentin to 100 mg bid.  Thanks, BJ

## 2017-12-14 NOTE — Telephone Encounter (Signed)
Spoke with pt and she states that since starting the Gabapentin she has noticed an increase in fatigue and dizziness. She would like to know your recommendations for continuing the medication or what else she can take.   Dr. Martinique - Please advise. Thanks!

## 2017-12-14 NOTE — Telephone Encounter (Signed)
Pt  Vas  Vague  Symptoms  Of  Fatigue  /   Weakness  Pt  Reports  Symptoms  Started   After  Taking  Gabapentin   .  The symptoms have  Been present for  approx  4 months . Pt  questians  If  She  Should  Be   Taking the  Medications   Or  Stop . She  Only  Wants   Someone  To  Ask the  Provider.Flow  Coordinator  conference   In   With  The  Pt   Reason for Disposition . Mild weakness or fatigue with acute minor illness (e.g., colds)  Answer Assessment - Initial Assessment Questions 1. DESCRIPTION: "Describe how you are feeling."      No  Energy   2. SEVERITY: "How bad is it?"  "Can you stand and walk?"   - MILD - Feels weak or tired, but does not interfere with work, school or normal activities   - [place] to stand and walk; weakness interferes with work, school, or normal activities   - SEVERE - Unable to stand or walk      no 3. ONSET:  "When did the weakness begin?"       4   Months    4. CAUSE: "What do you think is causing the weakness?"      Gabapentin   5. MEDICINES: "Have you recently started a new medicine or had a change in the amount of a medicine?"        Last put  On   Gabapentin    6. OTHER SYMPTOMS: "Do you have any other symptoms?" (e.g., chest pain, fever, cough, SOB, vomiting, diarrhea, bleeding)         Forgetfull    At  Times    Eyes   Are  Watering   And  Itches  At  Times     7. PREGNANCY: "Is there any chance you are pregnant?" "When was your last menstrual period?"          No  Protocols used: WEAKNESS (GENERALIZED) AND FATIGUE-A-AH

## 2017-12-15 ENCOUNTER — Other Ambulatory Visit: Payer: Self-pay | Admitting: *Deleted

## 2017-12-15 MED ORDER — OMEPRAZOLE 20 MG PO CPDR: 20.0000 mg | DELAYED_RELEASE_CAPSULE | Freq: Every day | ORAL | 1 refills | Status: DC

## 2017-12-15 NOTE — Telephone Encounter (Signed)
Spoke with pt and advised. She will take 200mg  for several days and then taper to 100mg . She will monitor possible s/e and call office if she would like to stop taking medication altogether.

## 2017-12-20 ENCOUNTER — Other Ambulatory Visit: Payer: Self-pay | Admitting: Family Medicine

## 2017-12-20 DIAGNOSIS — F419 Anxiety disorder, unspecified: Secondary | ICD-10-CM

## 2017-12-21 NOTE — Telephone Encounter (Signed)
Rx last filled 11/17/17, okay to refill?

## 2018-01-16 ENCOUNTER — Ambulatory Visit: Payer: Medicare Other | Admitting: Family Medicine

## 2018-01-16 ENCOUNTER — Encounter: Payer: Self-pay | Admitting: Family Medicine

## 2018-01-16 VITALS — BP 140/76 | HR 80 | Temp 98.2°F | Resp 12 | Ht 68.0 in | Wt 143.4 lb

## 2018-01-16 DIAGNOSIS — T451X5A Adverse effect of antineoplastic and immunosuppressive drugs, initial encounter: Secondary | ICD-10-CM

## 2018-01-16 DIAGNOSIS — N3281 Overactive bladder: Secondary | ICD-10-CM

## 2018-01-16 DIAGNOSIS — G620 Drug-induced polyneuropathy: Secondary | ICD-10-CM | POA: Diagnosis not present

## 2018-01-16 DIAGNOSIS — M5416 Radiculopathy, lumbar region: Secondary | ICD-10-CM

## 2018-01-16 DIAGNOSIS — I10 Essential (primary) hypertension: Secondary | ICD-10-CM | POA: Diagnosis not present

## 2018-01-16 DIAGNOSIS — F418 Other specified anxiety disorders: Secondary | ICD-10-CM | POA: Diagnosis not present

## 2018-01-16 DIAGNOSIS — R5382 Chronic fatigue, unspecified: Secondary | ICD-10-CM | POA: Insufficient documentation

## 2018-01-16 LAB — URINALYSIS, ROUTINE W REFLEX MICROSCOPIC
Bilirubin Urine: NEGATIVE
Ketones, ur: NEGATIVE
Leukocytes, UA: NEGATIVE
Nitrite: NEGATIVE
Specific Gravity, Urine: 1.015 (ref 1.000–1.030)
Total Protein, Urine: NEGATIVE
Urine Glucose: NEGATIVE
Urobilinogen, UA: 0.2 (ref 0.0–1.0)
pH: 5 (ref 5.0–8.0)

## 2018-01-16 LAB — BASIC METABOLIC PANEL
BUN: 17 mg/dL (ref 6–23)
CO2: 33 mEq/L — ABNORMAL HIGH (ref 19–32)
Calcium: 10.1 mg/dL (ref 8.4–10.5)
Chloride: 98 mEq/L (ref 96–112)
Creatinine, Ser: 0.78 mg/dL (ref 0.40–1.20)
GFR: 75.83 mL/min (ref >60.00)
Glucose, Bld: 94 mg/dL (ref 70–99)
Potassium: 4.6 mEq/L (ref 3.5–5.1)
Sodium: 138 mEq/L (ref 135–145)

## 2018-01-16 MED ORDER — TRAMADOL HCL 50 MG PO TABS: 25.0000 mg | ORAL_TABLET | Freq: Every evening | ORAL | 0 refills | Status: DC | PRN

## 2018-01-16 MED ORDER — ZOSTER VAC RECOMB ADJUVANTED 50 MCG/0.5ML IM SUSR: INTRAMUSCULAR | 1 refills | Status: DC

## 2018-01-16 NOTE — Patient Instructions (Signed)
A few things to remember from today's visit:   Lumbar back pain with radiculopathy affecting right lower extremity - Plan: traMADol (ULTRAM) 50 MG tablet  Chronic fatigue  Peripheral neuropathy due to chemotherapy [facility])  Essential hypertension - Plan: Basic metabolic panel  Other specified anxiety disorders  Overactive bladder - Plan: Urinalysis, Routine w reflex microscopic  Tramadol 1/2 tab to start at bedtime.  Tylenol 500 mg 4 times per day. Fall precautions. Limit fluid intake around 3-4 hours before bedtime.    Please be sure medication list is accurate. If a new problem present, please set up appointment sooner than planned today.

## 2018-01-16 NOTE — Progress Notes (Signed)
HPI:   [name] is a 77 y.o. female, who is here today for 4 months follow up.   She was last seen on 09/12/17 for her routine Medicare Preventive visit.   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    Currently she was on Gabapentin 100 mg daily but discontinued because side effects and she did feel like it was longer helping.  Numbness and burning feet sensation,stable overall.  Anxiety: Currently she is on Lorazepam 0.5 mg daily as needed.  He lower back pain with radiation to both lower extremities, "electricity" like pain, severe, exacerbated by prolonged standing or walking and alleviated some by sitting.  Pain keeps her awake at night. She has been treated with epidural injections, last one did not help as much as the first one did. Not good candidate for surgical treatment.  She did not tolerate Cymbalta and could not afford Lyrica.  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 She wants to know what she can take OTC. Tylenol does not help.  Severe pain that "makes me cry."  Pain is exacerbated by any  movement, it is affecting her daily activities.  Hypertension: Currently she is on Cozaar 50 mg daily. Home BP readings:120-130's/70-80's.  Denies headache, visual changes, chest pain, dyspnea, palpitation, focal weakness, or edema.  Lab Results  Component Value Date   CREATININE 0.82 11/25/2016   BUN 16 11/25/2016   NA 140 11/25/2016   K 4.2 11/25/2016   CL 100 11/25/2016   CO2 30 11/25/2016   Overactive bladder: She used to follow with urologist annually and had annual U/A. She would like U/A today. Denies dysuria,increased urinary frequency, gross hematuria,or decreased urine output. Nocturia x 1-3,stable for years.   Review of Systems  Constitutional: Positive for fatigue. Negative for activity change, appetite change and fever.  HENT: Negative for mouth sores, nosebleeds and trouble  swallowing.   Eyes: Negative for redness and visual disturbance.  Respiratory: Negative for cough, shortness of breath and wheezing.   Cardiovascular: Negative for chest pain, palpitations and leg swelling.  Gastrointestinal: Negative for abdominal pain, nausea and vomiting.       Negative for changes in bowel habits.  Endocrine: Negative for cold intolerance and heat intolerance.  Genitourinary: Negative for decreased urine volume, dysuria and hematuria.  Musculoskeletal: Positive for arthralgias and back pain.  Skin: Negative for rash and wound.  Neurological: Negative for syncope, weakness and headaches.  Psychiatric/Behavioral: Positive for sleep disturbance. Negative for confusion. The patient is nervous/anxious.      Current Outpatient Medications on File Prior to Visit  Medication Sig Dispense Refill  . acetaminophen (TYLENOL) 650 MG CR tablet Take 650 mg by mouth every 8 (eight) hours as needed.    . Calcium Carbonate (CALCIUM 600 PO) Take 1 tablet by mouth daily.    . cetirizine (ZYRTEC) 10 MG tablet Take 10 mg by mouth daily as needed for allergies.    Marland Kitchen loperamide (IMODIUM) 2 MG capsule Take 2 mg by mouth as needed.    Marland Kitchen LORazepam (ATIVAN) 0.5 MG tablet TAKE 1/2 TO 1 TABLET BY MOUTH EVERY NIGHT AT BEDTIME AS NEEDED 30 tablet 2  . losartan (COZAAR) 50 MG tablet TAKE 1 TABLET(50 MG) BY MOUTH DAILY 90 tablet 1  . omeprazole (PRILOSEC) 20 MG capsule Take 1 capsule (20 mg total) by mouth daily. 90 capsule 1  . Red Yeast Rice 600 MG CAPS Take 600 mg by  mouth 2 (two) times daily.    Marland Kitchen tolterodine (DETROL LA) 2 MG 24 hr capsule Take 1 capsule (2 mg total) by mouth daily. 90 capsule 2  . gabapentin (NEURONTIN) 100 MG capsule 2 caps in the morning and continue 1 cap in the afternoon. (Patient not taking: Reported on 01/16/2018) 90 capsule 3   No current facility-administered medications on file prior to visit.      Past Medical History:  Diagnosis Date  . Anxiety   . Cancer ([place])      rectal ca  . Thyroid disease    Allergies  Allergen Reactions  . Latex Rash  . Macrodantin Shortness Of Breath  . Guaifenesin Er Itching  . Morphine And Related Nausea Only  . Lipitor [Atorvastatin]     rash  . Lisinopril     Diarrhea/cough  . Pravastatin     Leg cramps  . Zoloft [Sertraline Hcl]     Diarrhea    Social History   Socioeconomic History  . Marital status: Married    Spouse name: None  . Number of children: None  . Years of education: None  . Highest education level: None  Social Needs  . Financial resource strain: None  . Food insecurity - worry: None  . Food insecurity - inability: None  . Transportation needs - medical: None  . Transportation needs - non-medical: None  Occupational History  . None  Tobacco Use  . Smoking status: Former Smoker    Types: Cigarettes  . Smokeless tobacco: Never Used  Substance and Sexual Activity  . Alcohol use: No  . Drug use: No  . Sexual activity: None  Other Topics Concern  . None  Social History Narrative  . None    Vitals:   01/16/18 1042  BP: 140/76  Pulse: 80  Resp: 12  Temp: 98.2 F (36.8 C)  SpO2: 98%   Body mass index is 21.8 kg/m.   Physical Exam  Nursing note and vitals reviewed. Constitutional: She is oriented to person, place, and time. She appears well-developed and well-nourished. No distress.  HENT:  Head: Normocephalic and atraumatic.  Mouth/Throat: Oropharynx is clear and moist and mucous membranes are normal. She has dentures.  Eyes: Conjunctivae are normal. Pupils are equal, round, and reactive to light.  Cardiovascular: Normal rate and regular rhythm.  No murmur heard. Pulses:      Dorsalis pedis pulses are 2+ on the right side, and 2+ on the left side.  Respiratory: Effort normal and breath sounds normal. No respiratory distress.  GI: Soft. She exhibits no mass. There is no hepatomegaly. There is no tenderness.  Musculoskeletal: She exhibits no edema or tenderness.        Thoracic back: She exhibits no tenderness and no bony tenderness.       Lumbar back: She exhibits no tenderness and no bony tenderness.  Mild limitation of full knee extension. No pain elicited. Heberden's and Bouchard's nodes, bilateral.   Lymphadenopathy:    She has no cervical adenopathy.  Neurological: She is alert and oriented to person, place, and time. She has normal strength.  Stable gait not assisted.  Skin: Skin is warm. No rash noted. No erythema.  Psychiatric: Her mood appears anxious.  Well groomed, good eye contact.      ASSESSMENT AND PLAN:   Ms. Melanie [name] was seen today for 4 months follow-up.   Diagnoses and all orders for this visit:  Lumbar back pain with radiculopathy affecting right lower  extremity  Treatment options for pain discussed, she would like to try Tramadol. We discussed side effects. She will try Tramadol half tablet at bedtime as needed, depending on tolerance we can increase dose. Tylenol 500 mg qid.  -     traMADol (ULTRAM) 50 MG tablet; Take 0.5 tablets (25 mg total) by mouth at bedtime as needed.  Peripheral neuropathy due to chemotherapy [facility])  She did not tolerate Cymbalta and Gabapentin. Symptoms are worse at night, keeping her from sleep.  She will try tramadol 50 mg half tablet at bedtime. Fall precautions discussed.  Essential hypertension  Adequately controlled. No changes in current management. DASH-Low-salt diet to continue Eye exam recommended annually. F/U in 6 months, before if needed.  -     Basic metabolic panel  Other specified anxiety disorders  Stable. Continue Lorazepam 0.5 mg daily as needed. Side effects of medication discussed.  Overactive bladder  Stable with current management. No changes today. Limit fluid intake around 3-4 hours beofre bedtime. Further recommendations will be given according to urinalysis. Follow-up in 12 months.  -     Urinalysis, Routine w reflex  microscopic  Other orders -     Zoster Vaccine Adjuvanted [facility]) injection; 0.5 ml in muscle and repeat in 8 weeks     -Ms. Melanie [name] was advised to return sooner than planned today if new concerns arise.       Sipriano Fendley G. Martinique, MD  [facility]. [place] office.

## 2018-02-01 DIAGNOSIS — L821 Other seborrheic keratosis: Secondary | ICD-10-CM | POA: Diagnosis not present

## 2018-02-01 DIAGNOSIS — D225 Melanocytic nevi of trunk: Secondary | ICD-10-CM | POA: Diagnosis not present

## 2018-02-01 DIAGNOSIS — C44319 Basal cell carcinoma of skin of other parts of face: Secondary | ICD-10-CM | POA: Diagnosis not present

## 2018-02-01 DIAGNOSIS — D1801 Hemangioma of skin and subcutaneous tissue: Secondary | ICD-10-CM | POA: Diagnosis not present

## 2018-02-01 DIAGNOSIS — D485 Neoplasm of uncertain behavior of skin: Secondary | ICD-10-CM | POA: Diagnosis not present

## 2018-02-13 ENCOUNTER — Ambulatory Visit: Payer: Medicare Other | Admitting: Family Medicine

## 2018-02-13 ENCOUNTER — Encounter: Payer: Self-pay | Admitting: Family Medicine

## 2018-02-13 VITALS — BP 150/88 | HR 113 | Temp 98.7°F | Ht 68.0 in | Wt 139.4 lb

## 2018-02-13 DIAGNOSIS — I10 Essential (primary) hypertension: Secondary | ICD-10-CM

## 2018-02-13 DIAGNOSIS — R05 Cough: Secondary | ICD-10-CM | POA: Diagnosis not present

## 2018-02-13 DIAGNOSIS — J989 Respiratory disorder, unspecified: Secondary | ICD-10-CM

## 2018-02-13 DIAGNOSIS — R0989 Other specified symptoms and signs involving the circulatory and respiratory systems: Secondary | ICD-10-CM | POA: Diagnosis not present

## 2018-02-13 DIAGNOSIS — J988 Other specified respiratory disorders: Secondary | ICD-10-CM | POA: Diagnosis not present

## 2018-02-13 DIAGNOSIS — R059 Cough, unspecified: Secondary | ICD-10-CM

## 2018-02-13 LAB — POC INFLUENZA A&B (BINAX/QUICKVUE)
Influenza A, POC: NEGATIVE
Influenza B, POC: NEGATIVE

## 2018-02-13 MED ORDER — OSELTAMIVIR PHOSPHATE 75 MG PO CAPS: 75.0000 mg | ORAL_CAPSULE | Freq: Two times a day (BID) | ORAL | 0 refills | Status: AC

## 2018-02-13 MED ORDER — BENZONATATE 100 MG PO CAPS: 200.0000 mg | ORAL_CAPSULE | Freq: Two times a day (BID) | ORAL | 0 refills | Status: DC | PRN

## 2018-02-13 MED ORDER — ALBUTEROL SULFATE HFA 108 (90 BASE) MCG/ACT IN AERS: 2.0000 | INHALATION_SPRAY | Freq: Four times a day (QID) | RESPIRATORY_TRACT | 0 refills | Status: DC | PRN

## 2018-02-13 MED ORDER — CEFTRIAXONE SODIUM 1 G IJ SOLR
1.0000 g | Freq: Once | INTRAMUSCULAR | Status: AC
  Administered 2018-02-13: 1 g via INTRAMUSCULAR

## 2018-02-13 NOTE — Patient Instructions (Addendum)
A few things to remember from today's visit:   Cough - Plan: POC Influenza A&B (Binax test), benzonatate (TESSALON) 100 MG capsule, cefTRIAXone (ROCEPHIN) injection 1 g  Acute URI - Plan: POC Influenza A&B (Binax test), cefTRIAXone (ROCEPHIN) injection 1 g  Respiratory tract infection - Plan: oseltamivir (TAMIFLU) 75 MG capsule, CBC with Differential/Platelet, DG Chest 2 View, cefTRIAXone (ROCEPHIN) injection 1 g  Hypoxia - Plan: cefTRIAXone (ROCEPHIN) injection 1 g  Reactive airway disease that is not asthma - Plan: albuterol (PROVENTIL HFA;VENTOLIN HFA) 108 (90 Base) MCG/ACT inhaler, cefTRIAXone (ROCEPHIN) injection 1 g  Albuterol inh 2 puff every 6 hours for a week then as needed for wheezing or shortness of breath.   Today X ray was ordered.  This can be done at [facility] at [facility] between 8 am and 5 pm: [place]. [phone].   Please be sure medication list is accurate. If a new problem present, please set up appointment sooner than planned today.

## 2018-02-13 NOTE — Progress Notes (Signed)
ACUTE VISIT  HPI:  Chief Complaint  Patient presents with  . Cough    causing chest discomfort, started Saturday, with greenish-yellowish mucus  . Headache  . Nasal Congestion    Melanie [name] is a 77 y.o.female here today with her husband complaining of 2-3 days of respiratory symptoms. Saturday night she started with nausea, next day she began with cough, sometimes productive with greenish sputum. Intermittent wheezing and dyspnea. Muscle ache, mainly abdominal and chest wall, exacerbated by coughing.   URI   This is a new problem. The current episode started in the past 7 days. Associated symptoms include congestion, coughing, headaches, joint pain, nausea, a plugged ear sensation, rhinorrhea, a sore throat and wheezing. Pertinent negatives include no abdominal pain, diarrhea, dysuria, ear pain, rash, sinus pain, swollen glands or vomiting. She has tried acetaminophen for the symptoms. The treatment provided mild relief.   Symptoms worse with activity and alleviated some by rest.  No Hx of recent travel. + Sick contact,husband was sick recently.   Hx of allergies: Yes,seasonal allergies.  OTC medications for this problem: Mucinex and Tylenol.  Symptoms otherwise stable.   Review of Systems  Constitutional: Positive for activity change, appetite change, chills and fatigue. Negative for fever.  HENT: Positive for congestion, postnasal drip, rhinorrhea, sore throat and voice change. Negative for ear pain, facial swelling, mouth sores, sinus pain and trouble swallowing.   Eyes: Negative for discharge and redness.  Respiratory: Positive for cough, shortness of breath and wheezing.   Cardiovascular: Negative for leg swelling.  Gastrointestinal: Positive for nausea. Negative for abdominal pain, diarrhea and vomiting.  Genitourinary: Negative for decreased urine volume and dysuria.  Musculoskeletal: Positive for arthralgias, back pain, gait problem, joint pain  and myalgias.  Skin: Negative for rash.  Allergic/Immunologic: Positive for environmental allergies.  Neurological: Positive for light-headedness and headaches. Negative for syncope and weakness.  Hematological: Negative for adenopathy. Does not bruise/bleed easily.  Psychiatric/Behavioral: Positive for sleep disturbance. Negative for confusion. The patient is nervous/anxious.       Current Outpatient Medications on File Prior to Visit  Medication Sig Dispense Refill  . acetaminophen (TYLENOL) 650 MG CR tablet Take 650 mg by mouth every 8 (eight) hours as needed.    . Calcium Carbonate (CALCIUM 600 PO) Take 1 tablet by mouth daily.    . cetirizine (ZYRTEC) 10 MG tablet Take 10 mg by mouth daily as needed for allergies.    Marland Kitchen loperamide (IMODIUM) 2 MG capsule Take 2 mg by mouth as needed.    Marland Kitchen LORazepam (ATIVAN) 0.5 MG tablet TAKE 1/2 TO 1 TABLET BY MOUTH EVERY NIGHT AT BEDTIME AS NEEDED 30 tablet 2  . losartan (COZAAR) 50 MG tablet TAKE 1 TABLET(50 MG) BY MOUTH DAILY 90 tablet 1  . omeprazole (PRILOSEC) 20 MG capsule Take 1 capsule (20 mg total) by mouth daily. 90 capsule 1  . Red Yeast Rice 600 MG CAPS Take 600 mg by mouth 2 (two) times daily.    Marland Kitchen tolterodine (DETROL LA) 2 MG 24 hr capsule Take 1 capsule (2 mg total) by mouth daily. 90 capsule 2  . traMADol (ULTRAM) 50 MG tablet Take 0.5 tablets (25 mg total) by mouth at bedtime as needed. 15 tablet 0  . Zoster Vaccine Adjuvanted [facility]) injection 0.5 ml in muscle and repeat in 8 weeks 0.5 mL 1  . gabapentin (NEURONTIN) 100 MG capsule 2 caps in the morning and continue 1 cap in the afternoon. (  Patient not taking: Reported on 01/16/2018) 90 capsule 3   No current facility-administered medications on file prior to visit.      Past Medical History:  Diagnosis Date  . Anxiety   . Cancer ([place])    rectal ca  . Thyroid disease    Allergies  Allergen Reactions  . Latex Rash  . Macrodantin Shortness Of Breath  . Guaifenesin Er  Itching  . Morphine And Related Nausea Only  . Lipitor [Atorvastatin]     rash  . Lisinopril     Diarrhea/cough  . Pravastatin     Leg cramps  . Zoloft [Sertraline Hcl]     Diarrhea    Social History   Socioeconomic History  . Marital status: Married    Spouse name: None  . Number of children: None  . Years of education: None  . Highest education level: None  Social Needs  . Financial resource strain: None  . Food insecurity - worry: None  . Food insecurity - inability: None  . Transportation needs - medical: None  . Transportation needs - non-medical: None  Occupational History  . None  Tobacco Use  . Smoking status: Former Smoker    Types: Cigarettes  . Smokeless tobacco: Never Used  Substance and Sexual Activity  . Alcohol use: No  . Drug use: No  . Sexual activity: None  Other Topics Concern  . None  Social History Narrative  . None    Vitals:   02/13/18 1459  BP: (!) 150/88  Pulse: (!) 113  Temp: 98.7 F (37.1 C)  SpO2: 90%   Body mass index is 21.19 kg/m.   Physical Exam  Nursing note and vitals reviewed. Constitutional: She is oriented to person, place, and time. She appears well-developed and well-nourished. She does not appear ill. She appears distressed (mild).  HENT:  Head: Normocephalic and atraumatic.  Right Ear: Tympanic membrane, external ear and ear canal normal.  Left Ear: Tympanic membrane, external ear and ear canal normal.  Nose: Rhinorrhea (Copious clear rhinorrhea.) present. Right sinus exhibits no maxillary sinus tenderness and no frontal sinus tenderness. Left sinus exhibits no maxillary sinus tenderness and no frontal sinus tenderness.  Mouth/Throat: Oropharynx is clear and moist and mucous membranes are normal.  Eyes: Conjunctivae are normal.  Neck: No muscular tenderness present. No edema and no erythema present.  Cardiovascular: Regular rhythm. Tachycardia present.  No murmur heard. Respiratory: Effort normal. No stridor.  No respiratory distress. She has decreased breath sounds. She has no wheezes. She has no rhonchi. She has no rales.  Persistent nonproductive cough during visit.  Lymphadenopathy:       Head (right side): No submandibular adenopathy present.       Head (left side): No submandibular adenopathy present.    She has no cervical adenopathy.  Neurological: She is alert and oriented to person, place, and time. She has normal strength.  Skin: Skin is warm. No rash noted. No erythema.  Psychiatric: Her mood appears anxious.  Well groomed, good eye contact.     ASSESSMENT AND PLAN:   Ms.Malay was seen today for cough, headache and nasal congestion.  Diagnoses and all orders for this visit:  Cough  Plenty of po fluids. Benzonatate recommended. Plain Mucinex to continue. Explained that cough and congestion could last a few days or weeks.  -     POC Influenza A&B (Binax test) -     benzonatate (TESSALON) 100 MG capsule; Take 2 capsules (200 mg total) by  mouth 2 (two) times daily as needed for up to 10 days. -     cefTRIAXone (ROCEPHIN) injection 1 g  Respiratory tract infection  Initially pulse ox was 85% RA, improved to 90% after albuterol neb treatment.  She is overall stable, so I do not think she needs to go to the ER at this time but she was clearly instructed about warning signs. At this time I think illness is most likely viral, even though rapid flu was negative given symptomatology I recommended Tamiflu.  She also received  Rocephin 1 g IM after verbal consent. Further recommendations will be given after CBC and CXR results.   -     POC Influenza A&B (Binax test) -     oseltamivir (TAMIFLU) 75 MG capsule; Take 1 capsule (75 mg total) by mouth 2 (two) times daily for 5 days. -     CBC with Differential/Platelet -     DG Chest 2 View; Future -     cefTRIAXone (ROCEPHIN) injection 1 g  Reactive airway disease that is not asthma  Cough and lung auscultation improved after  Albuterol neb treatment. Albuterol inh 2 puff every 6 hours for a week then as needed for wheezing or shortness of breath.  Instructed about warning signs. Follow-up in 1 week, before if needed.  -     albuterol (PROVENTIL HFA;VENTOLIN HFA) 108 (90 Base) MCG/ACT inhaler; Inhale 2 puffs into the lungs every 6 (six) hours as needed for wheezing or shortness of breath. -     cefTRIAXone (ROCEPHIN) injection 1 g   Essential hypertension BP elevated today. BP readings at home usually adequate.  Some for now no changes in current management for now. We will recheck BP in 3-4 days. Instructed about warning signs.      -Ms. Barnett Abu Heasley was advised to seek attention immediately if symptoms worsen, she and her husband voiced understanding and agreed with plan.     Betty G. Martinique, MD  [facility]. [place] office.

## 2018-02-13 NOTE — Assessment & Plan Note (Signed)
BP elevated today. BP readings at home usually adequate.  Some for now no changes in current management for now. We will recheck BP in 3-4 days. Instructed about warning signs.

## 2018-02-14 ENCOUNTER — Other Ambulatory Visit: Payer: Self-pay | Admitting: *Deleted

## 2018-02-14 ENCOUNTER — Ambulatory Visit (INDEPENDENT_AMBULATORY_CARE_PROVIDER_SITE_OTHER)
Admission: RE | Admit: 2018-02-14 | Discharge: 2018-02-14 | Disposition: A | Discharge status: Home / Self Care | Payer: Medicare Other | Source: Ambulatory Visit | Attending: Family Medicine | Admitting: Family Medicine

## 2018-02-14 DIAGNOSIS — R05 Cough: Secondary | ICD-10-CM | POA: Diagnosis not present

## 2018-02-14 DIAGNOSIS — J988 Other specified respiratory disorders: Secondary | ICD-10-CM | POA: Diagnosis not present

## 2018-02-14 DIAGNOSIS — I10 Essential (primary) hypertension: Secondary | ICD-10-CM

## 2018-02-14 LAB — CBC WITH DIFFERENTIAL/PLATELET
Basophils Absolute: 0.1 10*3/uL (ref 0.0–0.1)
Basophils Relative: 0.6 % (ref 0.0–3.0)
Eosinophils Absolute: 0 10*3/uL (ref 0.0–0.7)
Eosinophils Relative: 0.2 % (ref 0.0–5.0)
HCT: 39.3 % (ref 36.0–46.0)
Hemoglobin: 13.4 g/dL (ref 12.0–15.0)
Lymphocytes Relative: 6 % — ABNORMAL LOW (ref 12.0–46.0)
Lymphs Abs: 0.6 10*3/uL — ABNORMAL LOW (ref 0.7–4.0)
MCHC: 34 g/dL (ref 30.0–36.0)
MCV: 89.5 fl (ref 78.0–100.0)
Monocytes Absolute: 1.3 10*3/uL — ABNORMAL HIGH (ref 0.1–1.0)
Monocytes Relative: 12.5 % — ABNORMAL HIGH (ref 3.0–12.0)
Neutro Abs: 8.5 10*3/uL — ABNORMAL HIGH (ref 1.4–7.7)
Neutrophils Relative %: 80.7 % — ABNORMAL HIGH (ref 43.0–77.0)
Platelets: 246 10*3/uL (ref 150.0–400.0)
RBC: 4.39 Mil/uL (ref 3.87–5.11)
RDW: 12.8 % (ref 11.5–15.5)
WBC: 10.6 10*3/uL — ABNORMAL HIGH (ref 4.0–10.5)

## 2018-02-14 MED ORDER — LOSARTAN POTASSIUM 50 MG PO TABS: 50.0000 mg | ORAL_TABLET | Freq: Every day | ORAL | 1 refills | Status: DC

## 2018-02-19 ENCOUNTER — Ambulatory Visit: Payer: Medicare Other | Admitting: Family Medicine

## 2018-02-19 ENCOUNTER — Encounter: Payer: Self-pay | Admitting: Family Medicine

## 2018-02-19 VITALS — BP 148/75 | HR 88 | Temp 98.3°F | Resp 16 | Ht 68.0 in | Wt 138.4 lb

## 2018-02-19 DIAGNOSIS — R05 Cough: Secondary | ICD-10-CM

## 2018-02-19 DIAGNOSIS — R059 Cough, unspecified: Secondary | ICD-10-CM

## 2018-02-19 DIAGNOSIS — I10 Essential (primary) hypertension: Secondary | ICD-10-CM | POA: Diagnosis not present

## 2018-02-19 DIAGNOSIS — R7989 Other specified abnormal findings of blood chemistry: Secondary | ICD-10-CM | POA: Diagnosis not present

## 2018-02-19 MED ORDER — LOSARTAN POTASSIUM 100 MG PO TABS: 100.0000 mg | ORAL_TABLET | Freq: Every day | ORAL | 0 refills | Status: DC

## 2018-02-19 MED ORDER — BENZONATATE 100 MG PO CAPS: 200.0000 mg | ORAL_CAPSULE | Freq: Two times a day (BID) | ORAL | 0 refills | Status: AC | PRN

## 2018-02-19 NOTE — Progress Notes (Signed)
HPI:  Chief Complaint  Patient presents with  . Follow-up    Melanie [name] is a 77 y.o. female, who is here today to follow on recent OV.   She was seen on 02/13/18,when she was c/o cough,body aches, fatigue,SOB,and wheezing.   She is still coughing but it has improved. Benzonatate really helps and would like a refill.  Cough is exacerbated with lying done. She is sleeping on a recliner. Denies fever,chills, sore throat, dyspnea, orthopnea or PND.  + fatigue,also improved. Arthralgias back to her baseline.  CBC last OV mild leukocytosis. CXR COPD without acute abnormality.  HTN:  Home BP" at home elevated 150-160's/? She is on Losartan 50 mg daily.  She has not noted chest pain,palpitations,or focal deficit.    Review of Systems  Constitutional: Positive for activity change and fatigue. Negative for appetite change and fever.  HENT: Positive for congestion and postnasal drip. Negative for mouth sores, sinus pain, sore throat and trouble swallowing.   Eyes: Negative for discharge, redness and itching.  Respiratory: Positive for cough. Negative for shortness of breath and wheezing.   Cardiovascular: Negative for chest pain and leg swelling.  Gastrointestinal: Negative for abdominal pain, diarrhea, nausea and vomiting.  Musculoskeletal: Positive for back pain and gait problem.  Neurological: Negative for syncope, weakness and headaches.  Hematological: Negative for adenopathy. Does not bruise/bleed easily.  Psychiatric/Behavioral: Positive for sleep disturbance. Negative for confusion. The patient is nervous/anxious.       Current Outpatient Medications on File Prior to Visit  Medication Sig Dispense Refill  . acetaminophen (TYLENOL) 650 MG CR tablet Take 650 mg by mouth every 8 (eight) hours as needed.    Marland Kitchen albuterol (PROVENTIL HFA;VENTOLIN HFA) 108 (90 Base) MCG/ACT inhaler Inhale 2 puffs into the lungs every 6 (six) hours as needed for wheezing or  shortness of breath. 1 Inhaler 0  . Calcium Carbonate (CALCIUM 600 PO) Take 1 tablet by mouth daily.    . cetirizine (ZYRTEC) 10 MG tablet Take 10 mg by mouth daily as needed for allergies.    Marland Kitchen loperamide (IMODIUM) 2 MG capsule Take 2 mg by mouth as needed.    Marland Kitchen LORazepam (ATIVAN) 0.5 MG tablet TAKE 1/2 TO 1 TABLET BY MOUTH EVERY NIGHT AT BEDTIME AS NEEDED 30 tablet 2  . omeprazole (PRILOSEC) 20 MG capsule Take 1 capsule (20 mg total) by mouth daily. 90 capsule 1  . Red Yeast Rice 600 MG CAPS Take 600 mg by mouth 2 (two) times daily.    Marland Kitchen tolterodine (DETROL LA) 2 MG 24 hr capsule Take 1 capsule (2 mg total) by mouth daily. 90 capsule 2  . traMADol (ULTRAM) 50 MG tablet Take 0.5 tablets (25 mg total) by mouth at bedtime as needed. 15 tablet 0  . Zoster Vaccine Adjuvanted [employer]) injection 0.5 ml in muscle and repeat in 8 weeks 0.5 mL 1  . gabapentin (NEURONTIN) 100 MG capsule 2 caps in the morning and continue 1 cap in the afternoon. (Patient not taking: Reported on 01/16/2018) 90 capsule 3   No current facility-administered medications on file prior to visit.      Past Medical History:  Diagnosis Date  . Anxiety   . Cancer ([place])    rectal ca  . Thyroid disease    Allergies  Allergen Reactions  . Latex Rash  . Macrodantin Shortness Of Breath  . Guaifenesin Er Itching  . Morphine And Related Nausea Only  . Lipitor [Atorvastatin]  rash  . Lisinopril     Diarrhea/cough  . Pravastatin     Leg cramps  . Zoloft [Sertraline Hcl]     Diarrhea    Social History   Socioeconomic History  . Marital status: Married    Spouse name: None  . Number of children: None  . Years of education: None  . Highest education level: None  Social Needs  . Financial resource strain: None  . Food insecurity - worry: None  . Food insecurity - inability: None  . Transportation needs - medical: None  . Transportation needs - non-medical: None  Occupational History  . None  Tobacco Use  .  Smoking status: Former Smoker    Types: Cigarettes  . Smokeless tobacco: Never Used  Substance and Sexual Activity  . Alcohol use: No  . Drug use: No  . Sexual activity: None  Other Topics Concern  . None  Social History Narrative  . None    Vitals:   02/19/18 1438 02/19/18 1533  BP: (!) 145/84 (!) 148/75  Pulse: 88   Resp: 16   Temp: 98.3 F (36.8 C)   SpO2: 92%    Body mass index is 21.04 kg/m.    Physical Exam  Nursing note and vitals reviewed. Constitutional: She appears well-developed and well-nourished. She does not appear ill. No distress.  HENT:  Head: Normocephalic and atraumatic.  Mouth/Throat: Oropharynx is clear and moist and mucous membranes are normal.  Eyes: Conjunctivae are normal. Pupils are equal, round, and reactive to light.  Cardiovascular: Normal rate and regular rhythm.  No murmur heard. Respiratory: Effort normal and breath sounds normal. No respiratory distress.  GI: Soft. She exhibits no mass. There is no tenderness.  Musculoskeletal: She exhibits edema (Trace pitting LE edema, bilateral). She exhibits no tenderness.  Lymphadenopathy:    She has no cervical adenopathy.  Neurological: She is alert. She has normal strength.  Oriented x 3 otherwise, except that she does not remember year, "19 something"  Mildly unstable gait with no assistance.  Skin: Skin is warm. No rash noted. No erythema.  Psychiatric: Her mood appears anxious.  Well groomed, good eye contact.    ASSESSMENT AND PLAN:  Melanie [name] was seen today for follow-up.   Orders Placed This Encounter  Procedures  . CBC with Differential/Platelet  . Basic metabolic panel    Abnormal CBC  Further recommendations will be given according to lab results. Instructed about warning signs.   -     CBC with Differential/Platelet  Cough  Respiratory symptoms have improved. Explained that cough and congestion can last a few days and even weeks. She can continue  Benzonatate. Further recommendations will be given depending on CBC results.  -     benzonatate (TESSALON) 100 MG capsule; Take 2 capsules (200 mg total) by mouth 2 (two) times daily as needed for up to 10 days.    Essential hypertension Not well controlled. Possible complications of elevated BP discussed. Cozaar increased from 50 mg to 100 mg. F/U in 2 months.        Kara Melching G. Martinique, MD  [facility]. [place] office.

## 2018-02-19 NOTE — Patient Instructions (Addendum)
A few things to remember from today's visit:   Essential hypertension - Plan: losartan (COZAAR) 100 MG tablet  Abnormal CBC - Plan: CBC with Differential/Platelet  Today Cozaar increased from 50 mg to 100 mg. Continue monitoring blood pressure.    Please be sure medication list is accurate. If a new problem present, please set up appointment sooner than planned today.

## 2018-02-19 NOTE — Assessment & Plan Note (Signed)
Not well controlled. Possible complications of elevated BP discussed. Cozaar increased from 50 mg to 100 mg. F/U in 2 months.

## 2018-02-20 LAB — CBC WITH DIFFERENTIAL/PLATELET
Basophils Absolute: 0 10*3/uL (ref 0.0–0.1)
Basophils Relative: 0.3 % (ref 0.0–3.0)
Eosinophils Absolute: 0.2 10*3/uL (ref 0.0–0.7)
Eosinophils Relative: 2.5 % (ref 0.0–5.0)
HCT: 37.3 % (ref 36.0–46.0)
Hemoglobin: 12.7 g/dL (ref 12.0–15.0)
Lymphocytes Relative: 17 % (ref 12.0–46.0)
Lymphs Abs: 1.2 10*3/uL (ref 0.7–4.0)
MCHC: 34.1 g/dL (ref 30.0–36.0)
MCV: 89 fl (ref 78.0–100.0)
Monocytes Absolute: 0.8 10*3/uL (ref 0.1–1.0)
Monocytes Relative: 10.5 % (ref 3.0–12.0)
Neutro Abs: 5.1 10*3/uL (ref 1.4–7.7)
Neutrophils Relative %: 69.7 % (ref 43.0–77.0)
Platelets: 373 10*3/uL (ref 150.0–400.0)
RBC: 4.19 Mil/uL (ref 3.87–5.11)
RDW: 12.8 % (ref 11.5–15.5)
WBC: 7.3 10*3/uL (ref 4.0–10.5)

## 2018-02-20 LAB — BASIC METABOLIC PANEL
BUN: 11 mg/dL (ref 6–23)
CO2: 34 mEq/L — ABNORMAL HIGH (ref 19–32)
Calcium: 9.8 mg/dL (ref 8.4–10.5)
Chloride: 96 mEq/L (ref 96–112)
Creatinine, Ser: 0.7 mg/dL (ref 0.40–1.20)
GFR: 85.89 mL/min (ref >60.00)
Glucose, Bld: 88 mg/dL (ref 70–99)
Potassium: 3.8 mEq/L (ref 3.5–5.1)
Sodium: 139 mEq/L (ref 135–145)

## 2018-02-28 ENCOUNTER — Encounter: Payer: Self-pay | Admitting: Family Medicine

## 2018-03-05 ENCOUNTER — Telehealth: Payer: Self-pay | Admitting: Family Medicine

## 2018-03-05 NOTE — Telephone Encounter (Signed)
Copied from [place]. Topic: Quick Communication - See Telephone Encounter >> Mar 05, 2018 11:02 AM Synthia Innocent wrote: CRM for notification. See Telephone encounter for: Would like to speak with Dr Martinique regarding BP. Feels like her BP meds are making her feel like something is bad is going to happen. She went back to taking on 50mg  losartan. BP readings since going back on 50mg ,  02/23/18 128/65 02/26/18 131/61 02/28/18 128/76 03/01/18 131/68  03/02/18 129/64 03/03/18 128/65 03/04/18 123/63 03/05/18 129/64 Would like to know if its ok to go back on 50mg . Please advise 03/05/18.

## 2018-03-06 NOTE — Telephone Encounter (Signed)
HTN adequately controlled. Continue current antihypertensive medication dose.  Thanks, BJ

## 2018-03-07 ENCOUNTER — Other Ambulatory Visit: Payer: Self-pay | Admitting: Family Medicine

## 2018-03-07 DIAGNOSIS — R0989 Other specified symptoms and signs involving the circulatory and respiratory systems: Secondary | ICD-10-CM

## 2018-03-07 DIAGNOSIS — J989 Respiratory disorder, unspecified: Secondary | ICD-10-CM

## 2018-03-07 NOTE — Telephone Encounter (Signed)
Spoke with patient, patient stated that she has been taking 50 mg, not 100 mg and she feels a whole lot better. She stated that she will continue to take the 50 mg until her next OV. Gave pt instructions per Dr. Martinique, patient verbalized understanding.

## 2018-03-15 ENCOUNTER — Other Ambulatory Visit: Payer: Self-pay | Admitting: Family Medicine

## 2018-03-15 DIAGNOSIS — N3281 Overactive bladder: Secondary | ICD-10-CM

## 2018-03-19 ENCOUNTER — Other Ambulatory Visit: Payer: Self-pay | Admitting: *Deleted

## 2018-03-22 ENCOUNTER — Other Ambulatory Visit: Payer: Self-pay | Admitting: *Deleted

## 2018-03-22 MED ORDER — LOSARTAN POTASSIUM 50 MG PO TABS: 50.0000 mg | ORAL_TABLET | Freq: Every day | ORAL | 3 refills | Status: DC

## 2018-03-26 ENCOUNTER — Telehealth: Payer: Self-pay | Admitting: Family Medicine

## 2018-03-26 NOTE — Telephone Encounter (Signed)
Oxybutynin 5 mg I think will be cheaper, she can check with her insurance about options. If she agrees with oxybutynin, prescription can be sent: Oxybutynin 5 mg twice daily.  Thanks, BJ

## 2018-03-26 NOTE — Telephone Encounter (Signed)
Copied from [place]. Topic: Quick Communication - See Telephone Encounter >> Mar 26, 2018 10:54 AM Conception Chancy, NT wrote: CRM for notification. See Telephone encounter for: 03/26/18.  Patient is calling and states tolterodine (DETROL LA) 2 MG 24 hr capsule was called in for her and it is $179. She states it is usually $18. She would like something cheaper called in. Please advise.  [place] [place] [address] [place] [address] Phone: [phone] Fax: [phone]

## 2018-03-27 NOTE — Telephone Encounter (Signed)
Spoke with patient and she stated that she is going to call insurance to see how much Oxybutynin cost and then she would give clinic a call back with decision.

## 2018-03-30 ENCOUNTER — Other Ambulatory Visit: Payer: Self-pay | Admitting: *Deleted

## 2018-03-30 DIAGNOSIS — N3281 Overactive bladder: Secondary | ICD-10-CM

## 2018-03-30 MED ORDER — OXYBUTYNIN CHLORIDE 5 MG PO TABS: 5.0000 mg | ORAL_TABLET | Freq: Two times a day (BID) | ORAL | 3 refills | Status: DC

## 2018-03-30 NOTE — Telephone Encounter (Signed)
Patient informed that Rx was sent to pharmacy as requested. Pt verbalized understanding.

## 2018-03-30 NOTE — Telephone Encounter (Signed)
Pt calling stating that she do want to try the Oxybutynin 90day with 3 refills please send through mail order [place], [place] [phone] (Phone) [phone] (Fax)

## 2018-04-03 ENCOUNTER — Other Ambulatory Visit: Payer: Self-pay | Admitting: Family Medicine

## 2018-04-03 DIAGNOSIS — F419 Anxiety disorder, unspecified: Secondary | ICD-10-CM

## 2018-04-06 NOTE — Telephone Encounter (Signed)
This medication has been sent to pharmacy with a 90 supply and refills. Outpatient Medication Detail    Disp Refills Start End   oxybutynin (DITROPAN) 5 MG tablet 90 tablet 3 03/30/2018    Sig - Route: Take 1 tablet (5 mg total) by mouth 2 (two) times daily. - Oral   Sent to pharmacy as: oxybutynin (DITROPAN) 5 MG tablet   E-Prescribing Status: Receipt confirmed by pharmacy (03/30/2018 4:00 PM EDT)   Pharmacy   [place], [place]

## 2018-04-06 NOTE — Telephone Encounter (Signed)
Pt states [facility] is still asking for the 90 day supply regarding the Rx, contact pharmacy if needed

## 2018-04-08 NOTE — Progress Notes (Signed)
HPI:   [name] is a 77 y.o. female, who is here today for 6 months follow up.   She was last seen on 02/19/18 for acute visit follow up.  Hypertension:   Currently on Cozaar 50 mg daily.   No side effects reported. Home BP's: 120-130's/60-70.  She has not noted unusual headache, visual changes, exertional chest pain, dyspnea,  focal weakness, or edema.   Lab Results  Component Value Date   CREATININE 0.70 02/19/2018   BUN 11 02/19/2018   NA 139 02/19/2018   K 3.8 02/19/2018   CL 96 02/19/2018   CO2 34 (H) 02/19/2018     Peripheral neuropathy , s/p chemotherapy: Started on Tramadol 50 mg to take mainly at night.She did not continue , took 1/2 to 1 tab. States that she does not had much pain through the night most of the time, it is usually during the day.  She did not tolerate Cymbalta,Lyrica,or Gabapentin.  Bilateral burning and numb sensation.    Anxiety: She is on Ativan 0.5 mg daily as needed, mainly at bedtime to help her sleep. She has been on medication for 20+ years.  She denies depressed mood or suicidal thoughts.  Overactive bladder:  Recently changed to Oxybutynin 5 mg because changes in health insurance coverage. She was on Detrol LA 4 mg.She has not received new Rx, she is still taking Detrol LA.  No dysuria, gross hematuria, or pelvic pain.    Review of Systems  Constitutional: Positive for fatigue. Negative for activity change, appetite change and fever.  HENT: Negative for mouth sores, nosebleeds and trouble swallowing.   Eyes: Negative for redness and visual disturbance.  Respiratory: Negative for cough, shortness of breath and wheezing.   Cardiovascular: Negative for chest pain, palpitations and leg swelling.  Gastrointestinal: Negative for abdominal pain, nausea and vomiting.       Negative for changes in bowel habits.  Genitourinary: Negative for decreased urine volume, dysuria and hematuria.  Musculoskeletal:  Positive for arthralgias and back pain.  Skin: Negative for rash.  Neurological: Positive for numbness. Negative for syncope, weakness and headaches.  Psychiatric/Behavioral: Negative for confusion. The patient is nervous/anxious.     Current Outpatient Medications on File Prior to Visit  Medication Sig Dispense Refill  . acetaminophen (TYLENOL) 650 MG CR tablet Take 650 mg by mouth every 8 (eight) hours as needed.    . Calcium Carbonate (CALCIUM 600 PO) Take 1 tablet by mouth daily.    . cetirizine (ZYRTEC) 10 MG tablet Take 10 mg by mouth daily as needed for allergies.    Marland Kitchen loperamide (IMODIUM) 2 MG capsule Take 2 mg by mouth as needed.    Marland Kitchen losartan (COZAAR) 50 MG tablet Take 1 tablet (50 mg total) by mouth daily. 90 tablet 3  . omeprazole (PRILOSEC) 20 MG capsule Take 1 capsule (20 mg total) by mouth daily. 90 capsule 1  . Red Yeast Rice 600 MG CAPS Take 600 mg by mouth 2 (two) times daily.    Marland Kitchen Zoster Vaccine Adjuvanted [facility]) injection 0.5 ml in muscle and repeat in 8 weeks 0.5 mL 1  . LORazepam (ATIVAN) 0.5 MG tablet TAKE 1/2 TO 1 TABLET BY MOUTH EVERY NIGHT AT BEDTIME AS NEEDED 30 tablet 3   No current facility-administered medications on file prior to visit.      Past Medical History:  Diagnosis Date  . Anxiety   . Cancer ([place])    rectal ca  .  Thyroid disease    Allergies  Allergen Reactions  . Latex Rash  . Macrodantin Shortness Of Breath  . Guaifenesin Er Itching  . Morphine And Related Nausea Only  . Lipitor [Atorvastatin]     rash  . Lisinopril     Diarrhea/cough  . Pravastatin     Leg cramps  . Zoloft [Sertraline Hcl]     Diarrhea    Social History   Socioeconomic History  . Marital status: Married    Spouse name: Not on file  . Number of children: Not on file  . Years of education: Not on file  . Highest education level: Not on file  Occupational History  . Not on file  Social Needs  . Financial resource strain: Not on file  . Food  insecurity:    Worry: Not on file    Inability: Not on file  . Transportation needs:    Medical: Not on file    Non-medical: Not on file  Tobacco Use  . Smoking status: Former Smoker    Types: Cigarettes  . Smokeless tobacco: Never Used  Substance and Sexual Activity  . Alcohol use: No  . Drug use: No  . Sexual activity: Not on file  Lifestyle  . Physical activity:    Days per week: Not on file    Minutes per session: Not on file  . Stress: Not on file  Relationships  . Social connections:    Talks on phone: Not on file    Gets together: Not on file    Attends religious service: Not on file    Active member of club or organization: Not on file    Attends meetings of clubs or organizations: Not on file    Relationship status: Not on file  Other Topics Concern  . Not on file  Social History Narrative  . Not on file    Vitals:   04/09/18 0939  BP: 132/80  Pulse: 89  Resp: 12  Temp: 98.8 F (37.1 C)  SpO2: 98%   Body mass index is 20.56 kg/m.   Physical Exam  Nursing note and vitals reviewed. Constitutional: She is oriented to person, place, and time. She appears well-developed and well-nourished. No distress.  HENT:  Head: Normocephalic and atraumatic.  Mouth/Throat: Oropharynx is clear and moist and mucous membranes are normal. She has dentures.  Eyes: Pupils are equal, round, and reactive to light. Conjunctivae are normal.  Cardiovascular: Normal rate and regular rhythm.  No murmur heard. Pulses:      Dorsalis pedis pulses are 2+ on the right side, and 2+ on the left side.  Respiratory: Effort normal and breath sounds normal. No respiratory distress.  GI: Soft. She exhibits no mass. There is no hepatomegaly. There is no tenderness.  Musculoskeletal: She exhibits no edema.  Lymphadenopathy:    She has no cervical adenopathy.  Neurological: She is alert and oriented to person, place, and time. She has normal strength.  Mildly unstable,not assisted.  Skin:  Skin is warm. No erythema.  Psychiatric: She has a normal mood and affect.  Well groomed, good eye contact.      ASSESSMENT AND PLAN:   Ms. Melanie [name] was seen today for 6 months follow-up.  No orders of the defined types were placed in this encounter.   Peripheral neuropathy due to chemotherapy [facility]) We have tried different medications, which were discontinued due to side effects, poor tolerance. No further recommendations given at this time. Fall/trauma prevention.  Essential hypertension Adequately controlled. No changes in current management. Low-salt diet to continue. Eye exam recommended annually. F/U in 5 months, before if needed.   Overactive bladder We went through prescription history, oxybutynin 5 mg was sent on 03/30/18.  Prescription was resent. We will titrate dose up according with clinical response. Follow-up in 5 months, before if needed.  Anxiety disorder Stable overall. No changes in Ativan 0.5 mg daily as needed.   She is tolerating well, aware of side effects, we discussed some. Follow-up in 5 months.    -Ms. Melanie [name] is to return sooner than planned today if new concerns arise.       Evin Loiseau G. Martinique, MD  [facility]. [place] office.

## 2018-04-09 ENCOUNTER — Ambulatory Visit: Payer: Medicare Other | Admitting: Family Medicine

## 2018-04-09 ENCOUNTER — Encounter: Payer: Self-pay | Admitting: Family Medicine

## 2018-04-09 VITALS — BP 132/80 | HR 89 | Temp 98.8°F | Resp 12 | Ht 68.0 in | Wt 135.2 lb

## 2018-04-09 DIAGNOSIS — G620 Drug-induced polyneuropathy: Secondary | ICD-10-CM | POA: Diagnosis not present

## 2018-04-09 DIAGNOSIS — N3281 Overactive bladder: Secondary | ICD-10-CM | POA: Diagnosis not present

## 2018-04-09 DIAGNOSIS — T451X5A Adverse effect of antineoplastic and immunosuppressive drugs, initial encounter: Secondary | ICD-10-CM

## 2018-04-09 DIAGNOSIS — F418 Other specified anxiety disorders: Secondary | ICD-10-CM | POA: Diagnosis not present

## 2018-04-09 DIAGNOSIS — I10 Essential (primary) hypertension: Secondary | ICD-10-CM | POA: Diagnosis not present

## 2018-04-09 MED ORDER — OXYBUTYNIN CHLORIDE 5 MG PO TABS: 5.0000 mg | ORAL_TABLET | Freq: Two times a day (BID) | ORAL | 1 refills | Status: DC

## 2018-04-09 NOTE — Assessment & Plan Note (Signed)
We have tried different medications, which were discontinued due to side effects, poor tolerance. No further recommendations given at this time. Fall/trauma prevention.

## 2018-04-09 NOTE — Assessment & Plan Note (Signed)
Stable overall. No changes in Ativan 0.5 mg daily as needed.   She is tolerating well, aware of side effects, we discussed some. Follow-up in 5 months.

## 2018-04-09 NOTE — Assessment & Plan Note (Signed)
Adequately controlled. No changes in current management. Low-salt diet to continue. Eye exam recommended annually. F/U in 5 months, before if needed.

## 2018-04-09 NOTE — Patient Instructions (Signed)
A few things to remember from today's visit:   Essential hypertension  Peripheral neuropathy due to chemotherapy [facility])  Other specified anxiety disorders  Overactive bladder  Prescription sent to your pharmacy again. Re-schedule appt with dermatologist.   Please be sure medication list is accurate. If a new problem present, please set up appointment sooner than planned today.

## 2018-04-09 NOTE — Assessment & Plan Note (Signed)
We went through prescription history, oxybutynin 5 mg was sent on 03/30/18.  Prescription was resent. We will titrate dose up according with clinical response. Follow-up in 5 months, before if needed.

## 2018-04-16 ENCOUNTER — Telehealth: Payer: Self-pay | Admitting: Family Medicine

## 2018-04-16 NOTE — Telephone Encounter (Signed)
Copied from [place] [phone]. Topic: Quick Communication - See Telephone Encounter >> Apr 16, 2018 12:02 PM Arletha Grippe wrote: CRM for notification. See Telephone encounter for: 04/16/18. Pharm called   oxybutynin (DITROPAN) 5 MG tablet   Pharm is calling to verify qty and instructions. They do not match  Cb [phone]

## 2018-04-17 NOTE — Telephone Encounter (Signed)
Spoke with pharmacist, verified quantity and instructions. No further actions needed.

## 2018-05-25 ENCOUNTER — Telehealth: Payer: Self-pay | Admitting: Family Medicine

## 2018-05-25 NOTE — Telephone Encounter (Signed)
Copied from [place] [id number]. Topic: Quick Communication - Rx Refill/Question >> May 25, 2018 10:11 AM Carolyn Stare wrote: Medication omeprazole (PRILOSEC) 20 MG capsule   pharmacy req 90 days supply   Has the patient contacted their pharmacy yes    Preferred Pharmacy  [facility]  Agent: Please be advised that RX refills may take up to 3 business days. We ask that you follow-up with your pharmacy.

## 2018-05-28 MED ORDER — OMEPRAZOLE 20 MG PO CPDR: 20.0000 mg | DELAYED_RELEASE_CAPSULE | Freq: Every day | ORAL | 1 refills | Status: DC

## 2018-05-28 NOTE — Telephone Encounter (Signed)
Request from mail order pharmacy for refill/90 day supply on Omeprazole. Last refill; 12/15/17; # 62; RF x 1  Last office visit 04/09/18 Last annual exam 09/12/17  PCP: Dr. Martinique  Pharmacy: [facility]  Omeprazole refilled per protocol

## 2018-05-30 DIAGNOSIS — C44319 Basal cell carcinoma of skin of other parts of face: Secondary | ICD-10-CM | POA: Diagnosis not present

## 2018-06-01 ENCOUNTER — Ambulatory Visit: Payer: Self-pay

## 2018-06-01 NOTE — Telephone Encounter (Signed)
Pt calling with severe diarrhea after taking ditropan. Pt states that she is "allergic to drugs that contain magnesium." Pt has stopped taking the ditropan. Pt states that she is tolerating fluids and took a dose of Kaopectate and she feels better. Pt wanted Dr. Martinique to know she has stopped the ditropan because her daughter-in-law read that it contained Magnesium. Home care advice given.  Reason for Disposition . SEVERE diarrhea (e.g., 7 or more times / day more than normal)  Answer Assessment - Initial Assessment Questions 1. DIARRHEA SEVERITY: "How bad is the diarrhea?" "How many extra stools have you had in the past 24 hours than normal?"    - MILD: Few loose or mushy BMs; increase of 1-3 stools over normal daily number of stools; mild increase in ostomy output.   - MODERATE: Increase of 4-6 stools daily over normal; moderate increase in ostomy output.   - SEVERE (or Worst Possible): Increase of 7 or more stools daily over normal; moderate increase in ostomy output; incontinence.     severe 2. ONSET: "When did the diarrhea begin?"      today 3. BM CONSISTENCY: "How loose or watery is the diarrhea?"      loose 4. VOMITING: "Are you also vomiting?" If so, ask: "How many times in the past 24 hours?"      Felt nauseated but no diarrhea 5. ABDOMINAL PAIN: "Are you having any abdominal pain?" If yes: "What does it feel like?" (e.g., crampy, dull, intermittent, constant)     Was cramping but gone 6. ABDOMINAL PAIN SEVERITY: If present, ask: "How bad is the pain?"  (e.g., Scale 1-10; mild, moderate, or severe)    - MILD (1-3): doesn't interfere with normal activities, abdomen soft and not tender to touch     - MODERATE (4-7): interferes with normal activities or awakens from sleep, tender to touch     - SEVERE (8-10): excruciating pain, doubled over, unable to do any normal activities    no pain 7. ORAL INTAKE: If vomiting, "Have you been able to drink liquids?" "How much fluids have you had in  the past 24 hours?"   Water gatorade 8. HYDRATION: "Any signs of dehydration?" (e.g., dry mouth [not just dry lips], too weak to stand, dizziness, new weight loss) "When did you last urinate?"     no 9. EXPOSURE: "Have you traveled to a foreign country recently?" "Have you been exposed to anyone with diarrhea?" "Could you have eaten any food that was spoiled?"     no 10. OTHER SYMPTOMS: "Do you have any other symptoms?" (e.g., fever, blood in stool)       no 11. PREGNANCY: "Is there any chance you are pregnant?" "When was your last menstrual period?"       n/a  Protocols used: DIARRHEA-A-AH

## 2018-06-15 ENCOUNTER — Telehealth: Payer: Self-pay | Admitting: Family Medicine

## 2018-06-15 NOTE — Telephone Encounter (Signed)
Copied from [place] [phone]. Topic: Quick Communication - See Telephone Encounter >> Jun 15, 2018  9:09 AM Synthia Innocent wrote: CRM for notification. See Telephone encounter for: 06/15/18. Patient called on 06/01/18, spoke with triage, has not received return call from anyone. Very concerned. Is requesting a call back asap regarding oxybutynin (DITROPAN) 5 MG tablet, unable to take due to containing Magnesium.

## 2018-06-15 NOTE — Telephone Encounter (Signed)
Nurse Triage message 06/01/18 was never routed to our Practice nor to the provider.  We were not made aware of any issues with the medications /contraindications that was prescribed.  Called patient and apologized for the delay in getting back with her. Advised that a message would be sent to Dr Martinique today and she would get a call back with recommendations.   States that she cannot take anything that contains Magnesium bc it works as a Laxative - Pt has very sensitive bowels since having rectal cancer and has to be careful of what she take.    Pt is requesting a different medication not containing Magnesium  Pharmacy [facility] [place], [place] - [address]  Please advise Dr Martinique. Thanks.

## 2018-06-15 NOTE — Telephone Encounter (Signed)
First I am not aware of the oxybutynin containing magnesium, as far  As I know it is not contraindicated in patients with side effects from Mg.  The most common side effect of oxybutynin is constipation, diarrhea is uncommon.  If she took medication and she developed diarrhea, we can change medication for a different agent: Enablex, Myrbetriq,Toviaz,vesicare among some. If she has not tried medication I recommend trying first before changing to a different medication.  Thanks, BJ

## 2018-06-15 NOTE — Telephone Encounter (Signed)
Message sent to Dr. Jordan for review. 

## 2018-06-15 NOTE — Telephone Encounter (Signed)
Spoke with patient and she stated that she tried the oxybutynin and it caused her diarrhea, she discarded medication in the trash. Patient stated that she will try Enablex, if a few pills can be sent into pharmacy for her to try to see if they work before a full Rx is sent to pharmacy. Please send medication to [facility] on [address]

## 2018-06-18 ENCOUNTER — Ambulatory Visit: Payer: Medicare Other | Admitting: Family Medicine

## 2018-06-18 ENCOUNTER — Encounter: Payer: Self-pay | Admitting: Family Medicine

## 2018-06-18 ENCOUNTER — Other Ambulatory Visit: Payer: Self-pay | Admitting: Family Medicine

## 2018-06-18 VITALS — BP 136/72 | HR 92 | Temp 98.5°F | Resp 12 | Ht 68.0 in | Wt 131.0 lb

## 2018-06-18 DIAGNOSIS — R059 Cough, unspecified: Secondary | ICD-10-CM

## 2018-06-18 DIAGNOSIS — J309 Allergic rhinitis, unspecified: Secondary | ICD-10-CM | POA: Insufficient documentation

## 2018-06-18 DIAGNOSIS — R05 Cough: Secondary | ICD-10-CM | POA: Diagnosis not present

## 2018-06-18 DIAGNOSIS — J3089 Other allergic rhinitis: Secondary | ICD-10-CM | POA: Diagnosis not present

## 2018-06-18 DIAGNOSIS — N3281 Overactive bladder: Secondary | ICD-10-CM

## 2018-06-18 MED ORDER — DARIFENACIN HYDROBROMIDE ER 7.5 MG PO TB24: 7.5000 mg | ORAL_TABLET | Freq: Every day | ORAL | 1 refills | Status: DC

## 2018-06-18 MED ORDER — BENZONATATE 100 MG PO CAPS: 200.0000 mg | ORAL_CAPSULE | Freq: Two times a day (BID) | ORAL | 0 refills | Status: AC | PRN

## 2018-06-18 MED ORDER — FLUTICASONE PROPIONATE 50 MCG/ACT NA SUSP: 1.0000 | Freq: Two times a day (BID) | NASAL | 3 refills | Status: DC

## 2018-06-18 MED ORDER — DOXYCYCLINE HYCLATE 100 MG PO TABS: 100.0000 mg | ORAL_TABLET | Freq: Two times a day (BID) | ORAL | 0 refills | Status: AC

## 2018-06-18 NOTE — Telephone Encounter (Signed)
Patient was seen in the office today where she was notified that the Rx had been sent in.

## 2018-06-18 NOTE — Patient Instructions (Signed)
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 Plain Mucinex. Flonase nasal spray 1 spray twice daily may also help.   I do not think you need antibiotics but if symptoms get worse or you develop fever you can go ahead and start doxycycline twice daily for 7 days along with a probiotic.  Please be sure medication list is accurate. If a new problem present, please set up appointment sooner than planned today.

## 2018-06-18 NOTE — Telephone Encounter (Signed)
Prescription for Enablex 7.5 mg to take daily was sent to her pharmacy as requested.  Thanks, BJ

## 2018-06-18 NOTE — Progress Notes (Signed)
ACUTE VISIT  HPI:  Chief Complaint  Patient presents with  . Cough    x 3 dasy, coughing up mucus, yellow color, chest congestion,     Melanie [name] is a 77 y.o.female here today complaining of "a couple" days of respiratory symptoms.  Unfortunately since her last visit her husband died due to complications with lupus. She is living by herself, in general she feels that she is doing well.  Productive cough with clear sputum or light yellow sputum, no hemoptysis. She has not identified exacerbating or alleviating factors.  She states that symptoms started suddenly.  She has not had fever, chills, wheezing, or dyspnea. She has had some postnasal drainage. History of "bad" allergies, currently she is on cetirizine 10 mg daily.  Cough  This is a new problem. The current episode started in the past 7 days. The problem has been unchanged. The problem occurs every few hours. The cough is productive of sputum. Associated symptoms include postnasal drip and rhinorrhea. Pertinent negatives include no chest pain, chills, ear congestion, ear pain, eye redness, fever, headaches, heartburn, hemoptysis, myalgias, nasal congestion, rash, sore throat, shortness of breath, sweats or wheezing. Nothing aggravates the symptoms. Risk factors for lung disease include smoking/tobacco exposure. She has tried OTC cough suppressant for the symptoms. The treatment provided mild relief. Her past medical history is significant for COPD and environmental allergies.    No Hx of recent travel. No sick contact.  Former smoker, she quit about 20 years ago. CXR on 02/14/2018 show COPD without acute cardiopulmonary abnormality.  OTC medications for this problem: OTC cough syrup.  Symptoms otherwise stable.  Overactive bladder:  + Urinary frequency and urgency. She was on Detrol 2 mg daily, which was helping with symptoms.  Detrol is not longer covered by her health insurance, a few days ago  oxybutynin were recommended.  She discontinue oxybutynin because it caused diarrhea. She denies lower abdominal pain, dysuria, or gross hematuria. Years ago she was following with urologist, reporting negative work-up.   Review of Systems  Constitutional: Positive for fatigue. Negative for activity change, appetite change, chills and fever.  HENT: Positive for postnasal drip, rhinorrhea and sinus pressure. Negative for congestion, ear pain, mouth sores, sore throat and voice change.   Eyes: Negative for discharge, redness and itching.  Respiratory: Positive for cough. Negative for hemoptysis, shortness of breath and wheezing.   Cardiovascular: Negative for chest pain.  Gastrointestinal: Negative for abdominal pain, diarrhea, heartburn, nausea and vomiting.  Genitourinary: Positive for frequency. Negative for decreased urine volume, dysuria and hematuria.  Musculoskeletal: Positive for arthralgias and gait problem. Negative for myalgias.  Skin: Negative for rash.  Allergic/Immunologic: Positive for environmental allergies.  Neurological: Negative for weakness and headaches.  Psychiatric/Behavioral: Negative for confusion. The patient is nervous/anxious.       Current Outpatient Medications on File Prior to Visit  Medication Sig Dispense Refill  . acetaminophen (TYLENOL) 650 MG CR tablet Take 650 mg by mouth every 8 (eight) hours as needed.    . Calcium Carbonate (CALCIUM 600 PO) Take 1 tablet by mouth daily.    . cetirizine (ZYRTEC) 10 MG tablet Take 10 mg by mouth daily as needed for allergies.    Marland Kitchen darifenacin (ENABLEX) 7.5 MG 24 hr tablet Take 1 tablet (7.5 mg total) by mouth daily. 30 tablet 1  . loperamide (IMODIUM) 2 MG capsule Take 2 mg by mouth as needed.    Marland Kitchen LORazepam (ATIVAN) 0.5 MG  tablet TAKE 1/2 TO 1 TABLET BY MOUTH EVERY NIGHT AT BEDTIME AS NEEDED 30 tablet 3  . losartan (COZAAR) 50 MG tablet Take 1 tablet (50 mg total) by mouth daily. 90 tablet 3  . omeprazole (PRILOSEC)  20 MG capsule Take 1 capsule (20 mg total) by mouth daily. 90 capsule 1  . Red Yeast Rice 600 MG CAPS Take 600 mg by mouth 2 (two) times daily.    Marland Kitchen Zoster Vaccine Adjuvanted [facility]) injection 0.5 ml in muscle and repeat in 8 weeks 0.5 mL 1   No current facility-administered medications on file prior to visit.      Past Medical History:  Diagnosis Date  . Anxiety   . 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 Thyroid disease    Allergies  Allergen Reactions  . Latex Rash  . Macrodantin Shortness Of Breath  . Guaifenesin Er Itching  . Morphine And Related Nausea Only  . Ditropan [Oxybutynin]     Causes diarrhea - contains Magnesium  . Lipitor [Atorvastatin]     rash  . Lisinopril     Diarrhea/cough  . Magnesium-Containing Compounds     Causes diarrhea  . Pravastatin     Leg cramps  . Zoloft [Sertraline Hcl]     Diarrhea    Social History   Socioeconomic History  . Marital status: Married    Spouse name: Not on file  . Number of children: Not on file  . Years of education: Not on file  . Highest education level: Not on file  Occupational History  . Not on file  Social Needs  . Financial resource strain: Not on file  . Food insecurity:    Worry: Not on file    Inability: Not on file  . Transportation needs:    Medical: Not on file    Non-medical: Not on file  Tobacco Use  . Smoking status: Former Smoker    Types: Cigarettes  . Smokeless tobacco: Never Used  Substance and Sexual Activity  . Alcohol use: No  . Drug use: No  . Sexual activity: Not on file  Lifestyle  . Physical activity:    Days per week: Not on file    Minutes per session: Not on file  . Stress: Not on file  Relationships  . Social connections:    Talks on phone: Not on file    Gets together: Not on file    Attends religious service: Not on file    Active member of club or organization: Not on file    Attends meetings of clubs or organizations: Not on file    Relationship status: Not on file    Other Topics Concern  . Not on file  Social History Narrative  . Not on file    Vitals:   06/18/18 1200  BP: 136/72  Pulse: 92  Resp: 12  Temp: 98.5 F (36.9 C)  SpO2: 93%   Body mass index is 19.92 kg/m.   Physical Exam  Nursing note and vitals reviewed. Constitutional: She is oriented to person, place, and time. She appears well-developed and well-nourished. She does not appear ill. No distress.  HENT:  Head: Normocephalic and atraumatic.  Nose: Rhinorrhea present. Right sinus exhibits no maxillary sinus tenderness and no frontal sinus tenderness. Left sinus exhibits no maxillary sinus tenderness and no frontal sinus tenderness.  Mouth/Throat: Oropharynx is clear and moist and mucous membranes are normal. She has dentures.  Eyes: Conjunctivae are normal.  Cardiovascular:  Normal rate and regular rhythm.  No murmur heard. Respiratory: Effort normal and breath sounds normal. No stridor. No respiratory distress.  Musculoskeletal: She exhibits no edema.  Lymphadenopathy:       Head (right side): No submandibular adenopathy present.       Head (left side): No submandibular adenopathy present.    She has no cervical adenopathy.  Neurological: She is alert and oriented to person, place, and time. She has normal strength.  Mildly unstable gait with no assistance  Skin: Skin is warm. No erythema.  Psychiatric: Her mood appears anxious. Her affect is labile.  Well groomed, good eye contact.     ASSESSMENT AND PLAN:   Ms. Gionni was seen today for cough.  Diagnoses and all orders for this visit:  Cough  We discussed possible etiologies. Explained that I do not think antibiotics are necessary at this time.  She is afraid of possible complications and not being able to come back to be evaluated. Recommend benzonatate 100 mg twice daily as needed. Adequate hydration. I sent prescription for doxycycline, instructed to start if symptoms get worse or if she develops  fever. She understands side effects of antibiotics. I do not think imaging is needed today. Instructed about warning signs. Follow-up as needed.  -     benzonatate (TESSALON) 100 MG capsule; Take 2 capsules (200 mg total) by mouth 2 (two) times daily as needed for up to 10 days.  Non-seasonal allergic rhinitis due to other allergic trigger  This problem could be contributing to her cough. Recommend Flonase nasal spray daily. Continue cetirizine 10 mg daily. Nasal irrigations with saline may also help.  -     fluticasone (FLONASE) 50 MCG/ACT nasal spray; Place 1 spray into both nostrils 2 (two) times daily.  Overactive bladder  Prescription for Enablex 7.5 mg to take daily was sent to the pharmacy earlier today. We discussed some side effects. She will let me know if she has any problem with insurance coverage or with tolerance.  Other orders -     doxycycline (VIBRA-TABS) 100 MG tablet; Take 1 tablet (100 mg total) by mouth 2 (two) times daily for 7 days.      Marqus Macphee G. Martinique, MD  [facility]. [place] office.

## 2018-06-22 ENCOUNTER — Telehealth: Payer: Self-pay | Admitting: *Deleted

## 2018-06-22 NOTE — Telephone Encounter (Signed)
Prior auth for Darifenacin ER 7.5mg  tablets sent to [email or web address].

## 2018-07-02 ENCOUNTER — Encounter: Payer: Self-pay | Admitting: Family Medicine

## 2018-07-02 ENCOUNTER — Ambulatory Visit: Payer: Medicare Other | Admitting: Family Medicine

## 2018-07-02 VITALS — BP 140/80 | HR 91 | Temp 98.5°F | Resp 12 | Ht 68.0 in | Wt 129.0 lb

## 2018-07-02 DIAGNOSIS — K219 Gastro-esophageal reflux disease without esophagitis: Secondary | ICD-10-CM

## 2018-07-02 DIAGNOSIS — R05 Cough: Secondary | ICD-10-CM

## 2018-07-02 DIAGNOSIS — R634 Abnormal weight loss: Secondary | ICD-10-CM

## 2018-07-02 DIAGNOSIS — R059 Cough, unspecified: Secondary | ICD-10-CM

## 2018-07-02 DIAGNOSIS — J3089 Other allergic rhinitis: Secondary | ICD-10-CM | POA: Diagnosis not present

## 2018-07-02 LAB — CBC WITH DIFFERENTIAL/PLATELET
Basophils Absolute: 0 10*3/uL (ref 0.0–0.1)
Basophils Relative: 0.6 % (ref 0.0–3.0)
Eosinophils Absolute: 0.1 10*3/uL (ref 0.0–0.7)
Eosinophils Relative: 1.5 % (ref 0.0–5.0)
HCT: 40.1 % (ref 36.0–46.0)
Hemoglobin: 13.7 g/dL (ref 12.0–15.0)
Lymphocytes Relative: 17.3 % (ref 12.0–46.0)
Lymphs Abs: 1.1 10*3/uL (ref 0.7–4.0)
MCHC: 34.1 g/dL (ref 30.0–36.0)
MCV: 89.5 fl (ref 78.0–100.0)
Monocytes Absolute: 0.7 10*3/uL (ref 0.1–1.0)
Monocytes Relative: 11.1 % (ref 3.0–12.0)
Neutro Abs: 4.4 10*3/uL (ref 1.4–7.7)
Neutrophils Relative %: 69.5 % (ref 43.0–77.0)
Platelets: 277 10*3/uL (ref 150.0–400.0)
RBC: 4.49 Mil/uL (ref 3.87–5.11)
RDW: 13.1 % (ref 11.5–15.5)
WBC: 6.4 10*3/uL (ref 4.0–10.5)

## 2018-07-02 LAB — TSH: TSH: 0.43 u[IU]/mL (ref 0.35–4.50)

## 2018-07-02 MED ORDER — RANITIDINE HCL 150 MG PO TABS: 150.0000 mg | ORAL_TABLET | Freq: Every day | ORAL | 2 refills | Status: DC

## 2018-07-02 NOTE — Patient Instructions (Addendum)
A few things to remember from today's visit:   Cough - Plan: DG Chest 2 View  Weight loss, non-intentional - Plan: DG Chest 2 View, CBC with Differential/Platelet, TSH  Gastroesophageal reflux disease without esophagitis - Plan: ranitidine (ZANTAC) 150 MG tablet  Wt Readings from Last 3 Encounters:  07/02/18 129 lb (58.5 kg)  06/18/18 131 lb (59.4 kg)  04/09/18 135 lb 4 oz (61.3 kg)   Change Zyrtec to Allegra 180 mg daily. Today X ray was ordered.  This can be done at [facility] between 8 am and 5 pm: [place]. [phone].   Cough, Adult A cough helps to clear your throat and lungs. A cough may last only 2-3 weeks (acute), or it may last longer than 8 weeks (chronic). Many different things can cause a cough. A cough may be a sign of an illness or another medical condition. Follow these instructions at home:  Pay attention to any changes in your cough.  Take medicines only as told by your doctor. ? If you were prescribed an antibiotic medicine, take it as told by your doctor. Do not stop taking it even if you start to feel better. ? Talk with your doctor before you try using a cough medicine.  Drink enough fluid to keep your pee (urine) clear or pale yellow.  If the air is dry, use a cold steam vaporizer or humidifier in your home.  Stay away from things that make you cough at work or at home.  If your cough is worse at night, try using extra pillows to raise your head up higher while you sleep.  Do not smoke, and try not to be around smoke. If you need help quitting, ask your doctor.  Do not have caffeine.  Do not drink alcohol.  Rest as needed. Contact a doctor if:  You have new problems (symptoms).  You cough up yellow fluid (pus).  Your cough does not get better after 2-3 weeks, or your cough gets worse.  Medicine does not help your cough and you are not sleeping well.  You have pain that gets worse or pain that is not  helped with medicine.  You have a fever.  You are losing weight and you do not know why.  You have night sweats. Get help right away if:  You cough up blood.  You have trouble breathing.  Your heartbeat is very fast. This information is not intended to replace advice given to you by your health care provider. Make sure you discuss any questions you have with your health care provider. Document Released: 08/25/2011 Document Revised: 05/19/2016 Document Reviewed: 02/18/2015 Elsevier Interactive Patient Education  2018 [employer].  Please be sure medication list is accurate. If a new problem present, please set up appointment sooner than planned today.

## 2018-07-02 NOTE — Telephone Encounter (Signed)
Fax received from [facility] stating the request was approved and I called the pharmacy and informed Tiffany of this.

## 2018-07-02 NOTE — Progress Notes (Signed)
HPI:  Chief Complaint  Patient presents with  . Follow-up    sx not getting any better from OV on 06/18/18    Melanie [name] is a 77 y.o.female here today complaining of persistent cough.  She was last seen on 06/18/2018, when she was complaining of a couple days of cough.  She has been coughing for about 2 weeks. Clear sputum, denies hemoptysis.  Cough is worse when lying down,interferring with sleep.  She has Hx of GERD she is on Omeprazole 20 mg daily. Negative for heartburn.  Cough  This is a new problem. The current episode started 1 to 4 weeks ago. The problem has been unchanged. The cough is productive of sputum. Associated symptoms include postnasal drip, rhinorrhea and weight loss. Pertinent negatives include no chest pain, chills, ear congestion, fever, headaches, heartburn, hemoptysis, nasal congestion, rash, sore throat, shortness of breath, sweats or wheezing. The symptoms are aggravated by lying down. She has tried OTC cough suppressant for the symptoms. The treatment provided mild relief. Her past medical history is significant for environmental allergies.    + Scratchy throat.   No Hx of recent travel. No sick contact.  Hx of allergies: Yes, allergic rhinitis. She is taking Cetirizine.   OTC medications for this problem: Mucinex.  She is also concerned about wt loss. She states that she is eating "a lot" and still losing wt. No fever,night sweats, changes in bowel habits,or blood in stool. Hx of colon cancer,s/p chemo   Last sigmoidoscopy in 2010.   Review of Systems  Constitutional: Positive for fatigue, unexpected weight change and weight loss. Negative for chills and fever.  HENT: Positive for postnasal drip and rhinorrhea. Negative for mouth sores and sore throat.   Respiratory: Positive for cough. Negative for hemoptysis, shortness of breath and wheezing.   Cardiovascular: Negative for chest pain.  Gastrointestinal: Negative for  abdominal pain, blood in stool, heartburn, nausea and vomiting.  Skin: Negative for pallor and rash.  Allergic/Immunologic: Positive for environmental allergies.  Neurological: Negative for syncope, weakness and headaches.  Psychiatric/Behavioral: Positive for sleep disturbance. Negative for confusion. The patient is nervous/anxious.       Current Outpatient Medications on File Prior to Visit  Medication Sig Dispense Refill  . acetaminophen (TYLENOL) 650 MG CR tablet Take 650 mg by mouth every 8 (eight) hours as needed.    . Calcium Carbonate (CALCIUM 600 PO) Take 1 tablet by mouth daily.    . cetirizine (ZYRTEC) 10 MG tablet Take 10 mg by mouth daily as needed for allergies.    . fluticasone (FLONASE) 50 MCG/ACT nasal spray Place 1 spray into both nostrils 2 (two) times daily. 16 g 3  . loperamide (IMODIUM) 2 MG capsule Take 2 mg by mouth as needed.    Marland Kitchen LORazepam (ATIVAN) 0.5 MG tablet TAKE 1/2 TO 1 TABLET BY MOUTH EVERY NIGHT AT BEDTIME AS NEEDED 30 tablet 3  . losartan (COZAAR) 50 MG tablet Take 1 tablet (50 mg total) by mouth daily. 90 tablet 3  . omeprazole (PRILOSEC) 20 MG capsule Take 1 capsule (20 mg total) by mouth daily. 90 capsule 1  . Red Yeast Rice 600 MG CAPS Take 600 mg by mouth 2 (two) times daily.    Marland Kitchen Zoster Vaccine Adjuvanted [facility]) injection 0.5 ml in muscle and repeat in 8 weeks 0.5 mL 1  . darifenacin (ENABLEX) 7.5 MG 24 hr tablet Take 1 tablet (7.5 mg total) by mouth daily. (Patient not taking:  Reported on 07/02/2018) 30 tablet 1   No current facility-administered medications on file prior to visit.      Past Medical History:  Diagnosis Date  . Anxiety   . 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 Thyroid disease    Allergies  Allergen Reactions  . Latex Rash  . Macrodantin Shortness Of Breath  . Guaifenesin Er Itching  . Morphine And Related Nausea Only  . Ditropan [Oxybutynin]     Causes diarrhea - contains Magnesium  . Lipitor [Atorvastatin]     rash  .  Lisinopril     Diarrhea/cough  . Magnesium-Containing Compounds     Causes diarrhea  . Pravastatin     Leg cramps  . Zoloft [Sertraline Hcl]     Diarrhea    Social History   Socioeconomic History  . Marital status: Married    Spouse name: Not on file  . Number of children: Not on file  . Years of education: Not on file  . Highest education level: Not on file  Occupational History  . Not on file  Social Needs  . Financial resource strain: Not on file  . Food insecurity:    Worry: Not on file    Inability: Not on file  . Transportation needs:    Medical: Not on file    Non-medical: Not on file  Tobacco Use  . Smoking status: Former Smoker    Types: Cigarettes  . Smokeless tobacco: Never Used  Substance and Sexual Activity  . Alcohol use: No  . Drug use: No  . Sexual activity: Not on file  Lifestyle  . Physical activity:    Days per week: Not on file    Minutes per session: Not on file  . Stress: Not on file  Relationships  . Social connections:    Talks on phone: Not on file    Gets together: Not on file    Attends religious service: Not on file    Active member of club or organization: Not on file    Attends meetings of clubs or organizations: Not on file    Relationship status: Not on file  Other Topics Concern  . Not on file  Social History Narrative  . Not on file    Vitals:   07/02/18 1418  BP: 140/80  Pulse: 91  Resp: 12  Temp: 98.5 F (36.9 C)  SpO2: 97%   Body mass index is 19.61 kg/m.    Wt Readings from Last 3 Encounters:  07/02/18 129 lb (58.5 kg)  06/18/18 131 lb (59.4 kg)  04/09/18 135 lb 4 oz (61.3 kg)     Physical Exam  Nursing note and vitals reviewed. Constitutional: She is oriented to person, place, and time. She appears well-developed. She does not appear ill. No distress.  HENT:  Head: Atraumatic.  Nose: Rhinorrhea present.  Mouth/Throat: Oropharynx is clear and moist and mucous membranes are normal. She has dentures.   Postnasal drainage.  Eyes: Conjunctivae are normal.  Cardiovascular: Normal rate and regular rhythm.  No murmur heard. Respiratory: Effort normal and breath sounds normal. No respiratory distress.  GI: Soft. She exhibits no mass. There is no hepatomegaly. There is no tenderness.  Musculoskeletal: She exhibits no edema or tenderness.  Lymphadenopathy:       Head (right side): No submandibular adenopathy present.       Head (left side): No submandibular adenopathy present.    She has no cervical adenopathy.  Neurological: She is  alert and oriented to person, place, and time. She has normal strength.  Skin: Skin is warm. No erythema.  Psychiatric: Her mood appears anxious.  Well groomed, good eye contact.      ASSESSMENT AND PLAN:   Ms. Maisley was seen today for follow-up.  Diagnoses and all orders for this visit:  Lab Results  Component Value Date   WBC 6.4 07/02/2018   HGB 13.7 07/02/2018   HCT 40.1 07/02/2018   MCV 89.5 07/02/2018   PLT 277.0 07/02/2018   Lab Results  Component Value Date   TSH 0.43 07/02/2018     Cough  We discussed possible causes. Some of her chronic medical problem cud be contributing to problem. CXR ordered today.  Continue plain Mucinex. -     DG Chest 2 View; Future  Weight loss, non-intentional  About 2 Lb wt loss since her last OV and 6 Lb since 03/2018. We will continue monitoring. F/U in 4 weeks.  -     DG Chest 2 View; Future -     CBC with Differential/Platelet -     TSH  Gastroesophageal reflux disease without esophagitis  Could be aggravating cough. Zantac 150 mg at bedtime added. No changes in Omeprazole. GERD precautions also recommended.  -     ranitidine (ZANTAC) 150 MG tablet; Take 1 tablet (150 mg total) by mouth at bedtime.  Non-seasonal allergic rhinitis due to other allergic trigger  Stop Cetirizine and try Allegra 180 mg daily. Nasal saline irrigations as needed. Continue Flonase nasal  spray.     Faithlynn Deeley G. Martinique, MD  [facility]. [place] office.

## 2018-07-03 ENCOUNTER — Telehealth: Payer: Self-pay | Admitting: Family Medicine

## 2018-07-03 NOTE — Telephone Encounter (Signed)
Copied from [place] [id number]. Topic: Quick Communication - See Telephone Encounter >> Jul 03, 2018  9:17 AM Conception Chancy, NT wrote: CRM for notification. See Telephone encounter for: 07/03/18.  Patient is calling and was seen on 07/02/18 and was prescribed ranitidine (ZANTAC) 150 MG tablet and she was previously taking omeprazole (PRILOSEC) 20 MG capsule. Patient is confused if she is supposed to continue taking Prilesec or stop taking that. Please contact.

## 2018-07-03 NOTE — Telephone Encounter (Signed)
Returned call to patient and informed her that she is to take Zantac at night and the Omeprazole during the day per Dr. Martinique. Patient verbalized understanding.

## 2018-07-05 ENCOUNTER — Encounter: Payer: Self-pay | Admitting: Family Medicine

## 2018-07-06 ENCOUNTER — Ambulatory Visit (INDEPENDENT_AMBULATORY_CARE_PROVIDER_SITE_OTHER)
Admission: RE | Admit: 2018-07-06 | Discharge: 2018-07-06 | Disposition: A | Discharge status: Home / Self Care | Payer: Medicare Other | Source: Ambulatory Visit | Attending: Family Medicine | Admitting: Family Medicine

## 2018-07-06 DIAGNOSIS — R05 Cough: Secondary | ICD-10-CM

## 2018-07-06 DIAGNOSIS — R634 Abnormal weight loss: Secondary | ICD-10-CM | POA: Diagnosis not present

## 2018-07-06 DIAGNOSIS — R059 Cough, unspecified: Secondary | ICD-10-CM

## 2018-07-31 ENCOUNTER — Ambulatory Visit: Payer: Medicare Other | Admitting: Family Medicine

## 2018-07-31 ENCOUNTER — Encounter: Payer: Self-pay | Admitting: Family Medicine

## 2018-07-31 VITALS — BP 132/84 | HR 74 | Temp 98.3°F | Resp 16 | Ht 68.0 in | Wt 128.5 lb

## 2018-07-31 DIAGNOSIS — K219 Gastro-esophageal reflux disease without esophagitis: Secondary | ICD-10-CM

## 2018-07-31 DIAGNOSIS — F418 Other specified anxiety disorders: Secondary | ICD-10-CM

## 2018-07-31 DIAGNOSIS — N3281 Overactive bladder: Secondary | ICD-10-CM

## 2018-07-31 DIAGNOSIS — R05 Cough: Secondary | ICD-10-CM | POA: Diagnosis not present

## 2018-07-31 DIAGNOSIS — J3089 Other allergic rhinitis: Secondary | ICD-10-CM | POA: Diagnosis not present

## 2018-07-31 DIAGNOSIS — R059 Cough, unspecified: Secondary | ICD-10-CM

## 2018-07-31 MED ORDER — RANITIDINE HCL 150 MG PO TABS: 150.0000 mg | ORAL_TABLET | Freq: Every day | ORAL | 2 refills | Status: DC

## 2018-07-31 MED ORDER — CITALOPRAM HYDROBROMIDE 10 MG PO TABS: 10.0000 mg | ORAL_TABLET | Freq: Every day | ORAL | 1 refills | Status: DC

## 2018-07-31 NOTE — Patient Instructions (Addendum)
A few things to remember from today's visit:   Gastroesophageal reflux disease without esophagitis - Plan: ranitidine (ZANTAC) 150 MG tablet  Cough  Non-seasonal allergic rhinitis due to other allergic trigger  Other specified anxiety disorders - Plan: citalopram (CELEXA) 10 MG tablet  Overactive bladder  Stop omeprazole. Continue with Zantac 150 mg at bedtime. Celexa 10 mg as started for anxiety/depression, I will see you back in 4 to 6 weeks.   Given the fact your insurance is not covering medications for urine incontinence, you can try [place] exercises.  This exercise helps with mild urine leakage associated with cough, laughing, or sneezing.   Tighten and relax the pelvic muscles intermittently during the day. Once you are familiar with exercise try to hold pelvic muscles contraction for about 8-10 seconds.in the beginning you may not be able to hold contraction for more than a second or 2 but eventually you will be able to hold contraction harder and for longer time. Perform  8-12 exercises 3 times per day and daily for 15-20 weeks. You will need to continue exercises indefinitely to have a lasting effect.       Please be sure medication list is accurate. If a new problem present, please set up appointment sooner than planned today.

## 2018-07-31 NOTE — Assessment & Plan Note (Signed)
Well controlled. Continue nasal irrigations with saline.

## 2018-07-31 NOTE — Assessment & Plan Note (Addendum)
Well controled. She agrees with trying to stop Omeprazole and continue with Zantac 150 mg daily and GERD precautions. F/U in 4-6 weeks.

## 2018-07-31 NOTE — Progress Notes (Signed)
HPI:   [name] is a 77 y.o. female, who is here today to follow on recent OV.   She was seen on 07/02/18, when she was c/o persistent cough.  Lab Results  Component Value Date   WBC 6.4 07/02/2018   HGB 13.7 07/02/2018   HCT 40.1 07/02/2018   MCV 89.5 07/02/2018   PLT 277.0 07/02/2018   CXR negative on 07/13/18.  Cough has resolved.   GERD: Zantac 150 mg added at bedtime. On Omeprazole 20 mg daily.  In the past she has forgotten taking PPI and had burning chest and heartburn. Sh has taken PPI for years.  Denies abdominal pain, nausea, vomiting, changes in bowel habits, blood in stool or melena.  Allergic rhinitis: She could not take Allegra because it contains magnesium, which aggravates her diarrhea. Symptoms are well controlled with saline nasal irrigations as needed. No fever, chills, sore throat, or sinus pressure. She is also using Flonase nasal spray as needed.   Concerns today:  She wonders if she can take an antidepressant. Denies suicidal thoughts. Her children recommended, she states that one of her daughters has been taking antidepressant since the death of her son and it has helped.  She is living by herself after her husband's death. Denies feeling worthless or hopeless for the past 2 weeks.  + Crying spells. In the past we had tried Cymbalta, trying to help with neuropathic pain and OA she did not tolerate well. Sertraline aggravated diarrhea.  Anxiety: Currently she is on Ativan 0.5 mg daily as needed. She has tolerated medication well, denies side effects.  Overactive bladder: Her health insurance is not covering Enablex.   History of urinary frequency/overactive bladder. She did not tolerate oxybutynin, it worsened diarrhea. She was on Detrol LA for years until her insurance stopped coverage. She is having urinary frequency but she does not think it is bad. She denies dysuria or gross hematuria.  Urinary incontinence was  aggravated by cough.   Review of Systems  Constitutional: Positive for fatigue. Negative for activity change, appetite change and fever.  HENT: Negative for mouth sores, nosebleeds, sore throat and trouble swallowing.   Eyes: Negative for redness and visual disturbance.  Respiratory: Negative for cough, shortness of breath and wheezing.   Cardiovascular: Negative for chest pain, palpitations and leg swelling.  Gastrointestinal: Negative for abdominal pain, nausea and vomiting.       Negative for changes in bowel habits.  Genitourinary: Positive for frequency. Negative for decreased urine volume, dysuria, hematuria and pelvic pain.  Musculoskeletal: Positive for arthralgias and gait problem.  Allergic/Immunologic: Positive for environmental allergies.  Neurological: Negative for syncope, weakness and headaches.  Psychiatric/Behavioral: Positive for sleep disturbance. Negative for confusion, hallucinations and suicidal ideas. The patient is nervous/anxious.       Current Outpatient Medications on File Prior to Visit  Medication Sig Dispense Refill  . acetaminophen (TYLENOL) 650 MG CR tablet Take 650 mg by mouth every 8 (eight) hours as needed.    . Calcium Carbonate (CALCIUM 600 PO) Take 1 tablet by mouth daily.    . fluticasone (FLONASE) 50 MCG/ACT nasal spray Place 1 spray into both nostrils 2 (two) times daily. 16 g 3  . loperamide (IMODIUM) 2 MG capsule Take 2 mg by mouth as needed.    Marland Kitchen LORazepam (ATIVAN) 0.5 MG tablet TAKE 1/2 TO 1 TABLET BY MOUTH EVERY NIGHT AT BEDTIME AS NEEDED 30 tablet 3  . losartan (COZAAR) 50 MG tablet  Take 1 tablet (50 mg total) by mouth daily. 90 tablet 3  . Red Yeast Rice 600 MG CAPS Take 600 mg by mouth 2 (two) times daily.    Marland Kitchen Zoster Vaccine Adjuvanted [facility]) injection 0.5 ml in muscle and repeat in 8 weeks 0.5 mL 1   No current facility-administered medications on file prior to visit.      Past Medical History:  Diagnosis Date  . Anxiety   .  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 Thyroid disease    Allergies  Allergen Reactions  . Latex Rash  . Macrodantin Shortness Of Breath  . Guaifenesin Er Itching  . Morphine And Related Nausea Only  . Ditropan [Oxybutynin]     Causes diarrhea - contains Magnesium  . Lipitor [Atorvastatin]     rash  . Lisinopril     Diarrhea/cough  . Magnesium-Containing Compounds     Causes diarrhea  . Pravastatin     Leg cramps  . Zoloft [Sertraline Hcl]     Diarrhea    Social History   Socioeconomic History  . Marital status: Married    Spouse name: Not on file  . Number of children: Not on file  . Years of education: Not on file  . Highest education level: Not on file  Occupational History  . Not on file  Social Needs  . Financial resource strain: Not on file  . Food insecurity:    Worry: Not on file    Inability: Not on file  . Transportation needs:    Medical: Not on file    Non-medical: Not on file  Tobacco Use  . Smoking status: Former Smoker    Types: Cigarettes  . Smokeless tobacco: Never Used  Substance and Sexual Activity  . Alcohol use: No  . Drug use: No  . Sexual activity: Not on file  Lifestyle  . Physical activity:    Days per week: Not on file    Minutes per session: Not on file  . Stress: Not on file  Relationships  . Social connections:    Talks on phone: Not on file    Gets together: Not on file    Attends religious service: Not on file    Active member of club or organization: Not on file    Attends meetings of clubs or organizations: Not on file    Relationship status: Not on file  Other Topics Concern  . Not on file  Social History Narrative  . Not on file    Vitals:   07/31/18 0951  BP: 132/84  Pulse: 74  Resp: 16  Temp: 98.3 F (36.8 C)  SpO2: 98%   Body mass index is 19.54 kg/m.   Physical Exam  Nursing note and vitals reviewed. Constitutional: She is oriented to person, place, and time. She appears well-developed and well-nourished.  No distress.  HENT:  Head: Normocephalic and atraumatic.  Nose: Septal deviation present.  Mouth/Throat: Oropharynx is clear and moist and mucous membranes are normal.  Eyes: Pupils are equal, round, and reactive to light. Conjunctivae are normal.  Cardiovascular: Normal rate and regular rhythm.  No murmur heard. Pulses:      Dorsalis pedis pulses are 2+ on the right side, and 2+ on the left side.  Respiratory: Effort normal and breath sounds normal. No respiratory distress.  GI: Soft. She exhibits no mass. There is no hepatomegaly. There is no tenderness.  Musculoskeletal: She exhibits no edema.  Lymphadenopathy:  She has no cervical adenopathy.  Neurological: She is alert and oriented to person, place, and time. She has normal strength. No cranial nerve deficit.  Mildly unstable gait with no assistance.  Skin: Skin is warm. No rash noted. No erythema.  Psychiatric: Her mood appears anxious.  Well groomed, good eye contact.    ASSESSMENT AND PLAN:  Ms. Melanie [name] was seen today for follow-up.  Diagnoses and all orders for this visit:  GERD (gastroesophageal reflux disease) Well controled. She agrees with trying to stop Omeprazole and continue with Zantac 150 mg daily and GERD precautions. F/U in 4-6 weeks.  Anxiety disorder After discussion of some side effects sh agrees with trying Celexa 10 mg. Sertraline caused diarrhea.  Instructed about warning signs. F/U in 4-6 weeks.  Overactive bladder Her insurance does not cover Enable. She does not feel like symptoms are bad. Continue non pharmacologic treatment. Kegel exercises may help. If symptoms get worse we need to know which medications is covered by her insurance.  Allergic rhinitis Well controlled. Continue nasal irrigations with saline.   Cough  Resolved. Follow-up as needed. Instructed about warning signs.    Lorece Keach G. Martinique, MD  [facility]. [place] office.

## 2018-07-31 NOTE — Assessment & Plan Note (Addendum)
Her insurance does not cover Enable. She does not feel like symptoms are bad. Continue non pharmacologic treatment. Kegel exercises may help. If symptoms get worse we need to know which medications is covered by her insurance.

## 2018-07-31 NOTE — Assessment & Plan Note (Signed)
After discussion of some side effects sh agrees with trying Celexa 10 mg. Sertraline caused diarrhea.  Instructed about warning signs. F/U in 4-6 weeks.

## 2018-08-07 ENCOUNTER — Other Ambulatory Visit: Payer: Self-pay | Admitting: Family Medicine

## 2018-08-13 ENCOUNTER — Other Ambulatory Visit: Payer: Self-pay | Admitting: Family Medicine

## 2018-08-13 DIAGNOSIS — F419 Anxiety disorder, unspecified: Secondary | ICD-10-CM

## 2018-09-03 NOTE — Progress Notes (Signed)
HPI:   [name] is a 77 y.o. female, who is here today to follow on recent OV. She was last seen on 07/31/18,when she was reporting worsening anxiety and depressed mood,aggravtaed by the death of her husband.  Celexa was recommended. She is on Lorazepam 0.5 mg 1/2-1 tab daily.  She has tolerated medication well,denies side effects. Anxiety has improved, she is not longer crying. Also her children have noted the difference.  Hypertension:  Currently on Losartan 50 mg daily. Home BP reading: 120-130's/60's.    She is taking medications as instructed, no side effects reported.  She has not noted unusual headache, visual changes, exertional chest pain, dyspnea,  focal weakness, or edema.   Lab Results  Component Value Date   CREATININE 0.70 02/19/2018   BUN 11 02/19/2018   NA 139 02/19/2018   K 3.8 02/19/2018   CL 96 02/19/2018   CO2 34 (H) 02/19/2018     GERD: She is on Omeprazole 20 mg and Zantac 150 mg at bedtime. The latter one was added in 06/2018. She is not having heartburn. Cough has resolved. No abdominal pain,nasuea,or vomiting.   Review of Systems  Constitutional: Positive for fatigue. Negative for activity change, appetite change and fever.  HENT: Negative for mouth sores, nosebleeds and trouble swallowing.   Eyes: Negative for redness and visual disturbance.  Respiratory: Negative for cough, shortness of breath and wheezing.   Cardiovascular: Negative for chest pain, palpitations and leg swelling.  Gastrointestinal: Negative for abdominal pain, nausea and vomiting.       Negative for changes in bowel habits.  Genitourinary: Negative for decreased urine volume, dysuria and hematuria.  Musculoskeletal: Positive for arthralgias and gait problem.  Neurological: Negative for syncope, weakness and headaches.  Psychiatric/Behavioral: Positive for sleep disturbance. Negative for confusion and suicidal ideas. The patient is nervous/anxious.        Current Outpatient Medications on File Prior to Visit  Medication Sig Dispense Refill  . acetaminophen (TYLENOL) 650 MG CR tablet Take 650 mg by mouth every 8 (eight) hours as needed.    . Calcium Carbonate (CALCIUM 600 PO) Take 1 tablet by mouth daily.    . fluticasone (FLONASE) 50 MCG/ACT nasal spray Place 1 spray into both nostrils 2 (two) times daily. 16 g 3  . loperamide (IMODIUM) 2 MG capsule Take 2 mg by mouth as needed.    Marland Kitchen LORazepam (ATIVAN) 0.5 MG tablet TAKE 1/2 TO 1 TABLET BY MOUTH EVERY NIGHT AT BEDTIME AS NEEDED 30 tablet 2  . losartan (COZAAR) 50 MG tablet TAKE 1 TABLET BY MOUTH DAILY 90 tablet 3  . ranitidine (ZANTAC) 150 MG tablet Take 1 tablet (150 mg total) by mouth at bedtime. 90 tablet 2  . Red Yeast Rice 600 MG CAPS Take 600 mg by mouth 2 (two) times daily.    Marland Kitchen Zoster Vaccine Adjuvanted [facility]) injection 0.5 ml in muscle and repeat in 8 weeks 0.5 mL 1   No current facility-administered medications on file prior to visit.      Past Medical History:  Diagnosis Date  . Anxiety   . 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 Thyroid disease    Allergies  Allergen Reactions  . Latex Rash  . Macrodantin Shortness Of Breath  . Guaifenesin Er Itching  . Morphine And Related Nausea Only  . Ditropan [Oxybutynin]     Causes diarrhea - contains Magnesium  . Lipitor [Atorvastatin]     rash  .  Lisinopril     Diarrhea/cough  . Magnesium-Containing Compounds     Causes diarrhea  . Pravastatin     Leg cramps  . Zoloft [Sertraline Hcl]     Diarrhea    Social History   Socioeconomic History  . Marital status: Married    Spouse name: Not on file  . Number of children: Not on file  . Years of education: Not on file  . Highest education level: Not on file  Occupational History  . Not on file  Social Needs  . Financial resource strain: Not on file  . Food insecurity:    Worry: Not on file    Inability: Not on file  . Transportation needs:    Medical: Not on  file    Non-medical: Not on file  Tobacco Use  . Smoking status: Former Smoker    Types: Cigarettes  . Smokeless tobacco: Never Used  Substance and Sexual Activity  . Alcohol use: No  . Drug use: No  . Sexual activity: Not on file  Lifestyle  . Physical activity:    Days per week: Not on file    Minutes per session: Not on file  . Stress: Not on file  Relationships  . Social connections:    Talks on phone: Not on file    Gets together: Not on file    Attends religious service: Not on file    Active member of club or organization: Not on file    Attends meetings of clubs or organizations: Not on file    Relationship status: Not on file  Other Topics Concern  . Not on file  Social History Narrative  . Not on file    Vitals:   09/04/18 1014  BP: 140/70  Pulse: 80  Resp: 16  Temp: 98.3 F (36.8 C)  SpO2: 95%   Body mass index is 19.61 kg/m.  Wt Readings from Last 3 Encounters:  09/04/18 129 lb (58.5 kg)  07/31/18 128 lb 8 oz (58.3 kg)  07/02/18 129 lb (58.5 kg)    Physical Exam  Nursing note and vitals reviewed. Constitutional: She is oriented to person, place, and time. She appears well-developed and well-nourished. No distress.  HENT:  Head: Normocephalic and atraumatic.  Mouth/Throat: Oropharynx is clear and moist and mucous membranes are normal. She has dentures.  Eyes: Pupils are equal, round, and reactive to light. Conjunctivae are normal.  Cardiovascular: Normal rate and regular rhythm.  No murmur heard. Pulses:      Dorsalis pedis pulses are 2+ on the right side, and 2+ on the left side.  Respiratory: Effort normal and breath sounds normal. No respiratory distress.  GI: Soft. She exhibits no mass. There is no tenderness.  Musculoskeletal: She exhibits no edema.  Lymphadenopathy:    She has no cervical adenopathy.  Neurological: She is alert and oriented to person, place, and time. She has normal strength. No cranial nerve deficit.  Mildly unstable  gait with no assistance.  Skin: Skin is warm. No rash noted. No erythema.  Psychiatric: Her mood appears anxious.  Well groomed, good eye contact.    ASSESSMENT AND PLAN:  Ms. Melanie [name] was seen today for follow-up.   Orders Placed This Encounter  Procedures  . Flu vaccine HIGH DOSE PF  . Basic metabolic panel   Lab Results  Component Value Date   CREATININE 0.72 09/04/2018   BUN 18 09/04/2018   NA 137 09/04/2018   K 4.4 09/04/2018   CL 97  09/04/2018   CO2 34 (H) 09/04/2018    Gastroesophageal reflux disease without esophagitis  Well controlled. No changes in Zantac or Omeprazole 20 mg. GERD precautions to continue.  Essential hypertension  Adequately controlled. No changes in current management. DASH-low salt diet to continue. Eye exam current. F/U in 6 months, before if needed.  -     Basic metabolic panel  Other specified anxiety disorders  Improved. No changes in Celexa. Continue Lorazepam 0.5 mg daily prn. Some side effects of meds dicussed. F/U in 4-5 months.  -     citalopram (CELEXA) 10 MG tablet; Take 1 tablet (10 mg total) by mouth daily.  Flu vaccine need -     Flu vaccine HIGH DOSE PF    Melanie [name] G. Martinique, MD  [facility]. [place] office.

## 2018-09-04 ENCOUNTER — Encounter: Payer: Self-pay | Admitting: Family Medicine

## 2018-09-04 ENCOUNTER — Ambulatory Visit: Payer: Medicare Other | Admitting: Family Medicine

## 2018-09-04 VITALS — BP 140/70 | HR 80 | Temp 98.3°F | Resp 16 | Ht 68.0 in | Wt 129.0 lb

## 2018-09-04 DIAGNOSIS — K219 Gastro-esophageal reflux disease without esophagitis: Secondary | ICD-10-CM | POA: Diagnosis not present

## 2018-09-04 DIAGNOSIS — Z23 Encounter for immunization: Secondary | ICD-10-CM | POA: Diagnosis not present

## 2018-09-04 DIAGNOSIS — F418 Other specified anxiety disorders: Secondary | ICD-10-CM

## 2018-09-04 DIAGNOSIS — I10 Essential (primary) hypertension: Secondary | ICD-10-CM | POA: Diagnosis not present

## 2018-09-04 LAB — BASIC METABOLIC PANEL
BUN: 18 mg/dL (ref 6–23)
CO2: 34 mEq/L — ABNORMAL HIGH (ref 19–32)
Calcium: 10 mg/dL (ref 8.4–10.5)
Chloride: 97 mEq/L (ref 96–112)
Creatinine, Ser: 0.72 mg/dL (ref 0.40–1.20)
GFR: 83.03 mL/min (ref >60.00)
Glucose, Bld: 88 mg/dL (ref 70–99)
Potassium: 4.4 mEq/L (ref 3.5–5.1)
Sodium: 137 mEq/L (ref 135–145)

## 2018-09-04 MED ORDER — CITALOPRAM HYDROBROMIDE 10 MG PO TABS: 10.0000 mg | ORAL_TABLET | Freq: Every day | ORAL | 1 refills | Status: DC

## 2018-09-04 NOTE — Patient Instructions (Addendum)
A few things to remember from today's visit:   Essential hypertension - Plan: Basic metabolic panel  Other specified anxiety disorders  Gastroesophageal reflux disease without esophagitis  No changes in meds.  Fall precautions.  I will see you back in 4 months.   Please be sure medication list is accurate. If a new problem present, please set up appointment sooner than planned today.

## 2018-09-08 ENCOUNTER — Encounter: Payer: Self-pay | Admitting: Family Medicine

## 2018-09-10 ENCOUNTER — Telehealth: Payer: Self-pay | Admitting: Family Medicine

## 2018-09-10 NOTE — Telephone Encounter (Signed)
Copied from [place] [id number]. Topic: Quick Communication - See Telephone Encounter >> Sep 10, 2018  9:21 AM Ahmed Prima L wrote: CRM for notification. See Telephone encounter for: 09/10/18.  Patient called and said she has been taking ranitidine (ZANTAC) 150 MG tablet, she said she seen on the news it is causing cancer. She wants to know does Dr Martinique still want her to take it or stop? What could she take in place of this if she needs to stop it?  [id number] - [place], [place] - [address] [place] [address] [place] [id number]

## 2018-09-10 NOTE — Telephone Encounter (Signed)
Message sent to Dr.Jordan. 

## 2018-09-14 NOTE — Telephone Encounter (Signed)
PT notified of update and verbalized understanding.

## 2018-09-14 NOTE — Telephone Encounter (Signed)
As far as I know Zantac has not been link to a particular type of cancer.  The generic of Zantac produced by a pharmaceutical company seem to be contaminated by carcinogens. There have been similar concern about all other medications, especially antihypertensive medications in the past few months.  As far as I know, patients are not being asked to stop medication at this time until more investigation is done.  If she feels like her symptoms are well controlled at this time, she can try to discontinue it.   Thanks, BJ

## 2018-10-03 ENCOUNTER — Telehealth: Payer: Self-pay | Admitting: Family Medicine

## 2018-10-03 NOTE — Telephone Encounter (Signed)
Copied from [place]. Topic: Quick Communication - See Telephone Encounter >> Oct 03, 2018  1:35 PM Vernona Rieger wrote: CRM for notification. See Telephone encounter for: 10/03/18.  Patient states she seen on the news today they were recalling her ranitidine (ZANTAC) 150 MG tablet. She would like to know is there an alternative she could take for this? Please advise and contact patient @ [phone].  [id number] - [place], [place] - [address] [place] [id number]

## 2018-10-03 NOTE — Telephone Encounter (Signed)
Message sent to Dr. Jordan for review. 

## 2018-10-03 NOTE — Telephone Encounter (Signed)
She can try Pepcid 20 mg (OTC) instead and continue Omeprazole.  Thanks, BJ

## 2018-10-05 NOTE — Telephone Encounter (Signed)
Left detailed message with instructions on medications per Dr. Martinique.

## 2018-10-09 DIAGNOSIS — H5213 Myopia, bilateral: Secondary | ICD-10-CM | POA: Diagnosis not present

## 2018-10-17 ENCOUNTER — Telehealth: Payer: Self-pay | Admitting: Family Medicine

## 2018-10-17 NOTE — Telephone Encounter (Signed)
Message sent to Dr. Jordan for review and approval. 

## 2018-10-17 NOTE — Telephone Encounter (Signed)
Copied from [place] [id number]. Topic: Quick Communication - See Telephone Encounter >> Oct 17, 2018  9:14 AM Melanie [name], Melanie [name] wrote: CRM for notification. See Telephone encounter for: 10/17/18. Pt calling to see if the citalopram (CELEXA) 10 MG tablet can be changed to something that does not have magnesium in the medication? She states that she had cancer a few years ago and cannot take meds with magnesium due to it makes her stomach upset. CB#: [phone]

## 2018-10-23 ENCOUNTER — Other Ambulatory Visit: Payer: Self-pay | Admitting: Family Medicine

## 2018-10-23 DIAGNOSIS — Z1231 Encounter for screening mammogram for malignant neoplasm of breast: Secondary | ICD-10-CM

## 2018-10-24 NOTE — Telephone Encounter (Signed)
When she was here for her follow-up she did not report any side effect. If she still wants to try medication, she can try sertraline 25 mg daily. Thanks, BJ

## 2018-10-26 ENCOUNTER — Other Ambulatory Visit: Payer: Self-pay | Admitting: Family Medicine

## 2018-10-26 ENCOUNTER — Telehealth: Payer: Self-pay | Admitting: Family Medicine

## 2018-10-26 MED ORDER — SERTRALINE HCL 25 MG PO TABS: 25.0000 mg | ORAL_TABLET | Freq: Every day | ORAL | 1 refills | Status: DC

## 2018-10-26 NOTE — Telephone Encounter (Signed)
Copied from [employer] [id number]. Topic: General - Other >> Oct 25, 2018  5:06 PM Yvette Rack wrote: Reason for CRM: Pt states she would like to try the Sertraline 25 mg as she was told not to take any medications with Magnesium in it. Pt requests new Rx be sent to pharmacy. [facility] [place], [place] - [address] [phone] (Phone)  [phone] (Fax)    Pt calling to check status

## 2018-10-26 NOTE — Telephone Encounter (Signed)
Most of this type of medications, SSRIs, can cause GI side effects including diarrhea, which is usually self-limited. Sertraline 25 mg was sent to her pharmacy. Follow-up in 6 to 8 weeks.  Thanks, BJ

## 2018-10-26 NOTE — Telephone Encounter (Signed)
Sertraline 25 mg sent to her pharmacy as she requested.   Please see telephone encounter 10/25/2018. Rocklyn Mayberry Martinique, MD

## 2018-10-26 NOTE — Telephone Encounter (Signed)
Spoke with patient and she stated that she cannot take Zoloft because it causes diarrhea, is there anything else that she can take.

## 2018-10-29 NOTE — Telephone Encounter (Signed)
Patient given recommendations on medication. Patient stated that she could do without medication because it caused her to have really bad diarrhea.  No further assistance needed at this time.

## 2018-11-10 ENCOUNTER — Other Ambulatory Visit: Payer: Self-pay | Admitting: Family Medicine

## 2018-11-10 DIAGNOSIS — F419 Anxiety disorder, unspecified: Secondary | ICD-10-CM

## 2018-12-04 ENCOUNTER — Other Ambulatory Visit: Payer: Self-pay | Admitting: Family Medicine

## 2018-12-05 ENCOUNTER — Ambulatory Visit
Admission: RE | Admit: 2018-12-05 | Discharge: 2018-12-05 | Disposition: A | Discharge status: Home / Self Care | Payer: Medicare Other | Source: Ambulatory Visit | Attending: Family Medicine | Admitting: Family Medicine

## 2018-12-05 DIAGNOSIS — Z1231 Encounter for screening mammogram for malignant neoplasm of breast: Secondary | ICD-10-CM

## 2019-01-02 DIAGNOSIS — H524 Presbyopia: Secondary | ICD-10-CM | POA: Diagnosis not present

## 2019-01-08 ENCOUNTER — Ambulatory Visit: Payer: Medicare Other | Admitting: Family Medicine

## 2019-01-11 ENCOUNTER — Ambulatory Visit (INDEPENDENT_AMBULATORY_CARE_PROVIDER_SITE_OTHER): Payer: Medicare Other | Admitting: Family Medicine

## 2019-01-11 ENCOUNTER — Encounter: Payer: Self-pay | Admitting: Family Medicine

## 2019-01-11 VITALS — BP 130/78 | HR 86 | Temp 98.7°F | Resp 12 | Ht 68.0 in | Wt 136.2 lb

## 2019-01-11 DIAGNOSIS — I10 Essential (primary) hypertension: Secondary | ICD-10-CM

## 2019-01-11 DIAGNOSIS — F418 Other specified anxiety disorders: Secondary | ICD-10-CM

## 2019-01-11 DIAGNOSIS — F4321 Adjustment disorder with depressed mood: Secondary | ICD-10-CM | POA: Diagnosis not present

## 2019-01-11 DIAGNOSIS — F4320 Adjustment disorder, unspecified: Secondary | ICD-10-CM | POA: Insufficient documentation

## 2019-01-11 NOTE — Patient Instructions (Signed)
A few things to remember from today's visit:   Essential hypertension  Other specified anxiety disorders  Adjustment disorder with depressed mood  Some medications to consider for depression are nortriptyline or Remeron.  Please be sure medication list is accurate. If a new problem present, please set up appointment sooner than planned today.

## 2019-01-11 NOTE — Progress Notes (Signed)
HPI:   [name] is a 78 y.o. female, who is here today for 4 months follow up.   She was last seen on 09/04/2017 to follow-up on anxiety and depression.  Her husband died a few months ago, on 28-Aug-2017 she requested antidepressant medication, same ones that her daughter was taking. During follow-up she reported feeling better and no side effects. A few phone calls after last visit reporting diarrhea with Lexapro, so medication was changed to sertraline;which she never started. She is afraid of having side effects.  She is currently on Ativan 0.5 mg daily as needed.  In general she feels like she is doing better, she has "good and bad days." She denies suicidal thoughts. States that her children do not call and visit as frequent as they did after her husband's death.  But at the same time she understands they are busy. She is not involved in any social activity, she does not feel like doing so for now.  Hypertension: Currently she is on Cozaar 50 mg daily. She brings BP readings, see numbers below.    Denies severe/frequent headache, visual changes, chest pain, dyspnea,focal weakness, or edema. Lab Results  Component Value Date   CREATININE 0.72 09/04/2018   BUN 18 09/04/2018   NA 137 09/04/2018   K 4.4 09/04/2018   CL 97 09/04/2018   CO2 34 (H) 09/04/2018      Review of Systems  Constitutional: Negative for activity change, appetite change, fatigue and fever.  HENT: Negative for mouth sores, nosebleeds and trouble swallowing.   Eyes: Negative for redness and visual disturbance.  Respiratory: Negative for cough, shortness of breath and wheezing.   Cardiovascular: Negative for chest pain, palpitations and leg swelling.  Gastrointestinal: Negative for abdominal pain, nausea and vomiting.       Negative for changes in bowel habits.  Genitourinary: Negative for decreased urine volume and hematuria.  Musculoskeletal: Positive for arthralgias, back pain and  gait problem.  Neurological: Negative for syncope, weakness and headaches.  Psychiatric/Behavioral: Negative for confusion, sleep disturbance and suicidal ideas. The patient is nervous/anxious.     Current Outpatient Medications on File Prior to Visit  Medication Sig Dispense Refill  . acetaminophen (TYLENOL) 650 MG CR tablet Take 650 mg by mouth every 8 (eight) hours as needed.    . Calcium Carbonate (CALCIUM 600 PO) Take 1 tablet by mouth daily.    . fluticasone (FLONASE) 50 MCG/ACT nasal spray Place 1 spray into both nostrils 2 (two) times daily. 16 g 3  . loperamide (IMODIUM) 2 MG capsule Take 2 mg by mouth as needed.    Marland Kitchen LORazepam (ATIVAN) 0.5 MG tablet TAKE 1/2 TO 1 TABLET BY MOUTH EVERY NIGHT AT BEDTIME AS NEEDED 30 tablet 2  . losartan (COZAAR) 50 MG tablet TAKE 1 TABLET BY MOUTH DAILY 90 tablet 3  . omeprazole (PRILOSEC) 20 MG capsule TAKE ONE CAPSULE BY MOUTH DAILY GENERIC EQUIVALENT FOR PRILOSEC 90 capsule 0  . Red Yeast Rice 600 MG CAPS Take 600 mg by mouth 2 (two) times daily.    Marland Kitchen Zoster Vaccine Adjuvanted [facility]) injection 0.5 ml in muscle and repeat in 8 weeks 0.5 mL 1   No current facility-administered medications on file prior to visit.      Past Medical History:  Diagnosis Date  . Anxiety   . 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 Thyroid disease    Allergies  Allergen Reactions  . Latex Rash  .  Macrodantin Shortness Of Breath  . Guaifenesin Er Itching  . Morphine And Related Nausea Only  . Ditropan [Oxybutynin]     Causes diarrhea - contains Magnesium  . Lipitor [Atorvastatin]     rash  . Lisinopril     Diarrhea/cough  . Magnesium-Containing Compounds     Causes diarrhea  . Pravastatin     Leg cramps  . Zoloft [Sertraline Hcl]     Diarrhea    Social History   Socioeconomic History  . Marital status: Married    Spouse name: Not on file  . Number of children: Not on file  . Years of education: Not on file  . Highest education level: Not on file    Occupational History  . Not on file  Social Needs  . Financial resource strain: Not on file  . Food insecurity:    Worry: Not on file    Inability: Not on file  . Transportation needs:    Medical: Not on file    Non-medical: Not on file  Tobacco Use  . Smoking status: Former Smoker    Types: Cigarettes  . Smokeless tobacco: Never Used  Substance and Sexual Activity  . Alcohol use: No  . Drug use: No  . Sexual activity: Not on file  Lifestyle  . Physical activity:    Days per week: Not on file    Minutes per session: Not on file  . Stress: Not on file  Relationships  . Social connections:    Talks on phone: Not on file    Gets together: Not on file    Attends religious service: Not on file    Active member of club or organization: Not on file    Attends meetings of clubs or organizations: Not on file    Relationship status: Not on file  Other Topics Concern  . Not on file  Social History Narrative  . Not on file    Vitals:   01/11/19 1028  BP: 130/78  Pulse: 86  Resp: 12  Temp: 98.7 F (37.1 C)  SpO2: 98%   Body mass index is 20.72 kg/m.   Physical Exam  Nursing note and vitals reviewed. Constitutional: She is oriented to person, place, and time. She appears well-developed and well-nourished. No distress.  HENT:  Head: Normocephalic and atraumatic.  Mouth/Throat: Oropharynx is clear and moist and mucous membranes are normal. She has dentures.  Eyes: Pupils are equal, round, and reactive to light. Conjunctivae are normal.  Cardiovascular: Normal rate and regular rhythm.  No murmur heard. Pulses:      Posterior tibial pulses are 2+ on the right side and 2+ on the left side.  Respiratory: Effort normal and breath sounds normal. No respiratory distress.  GI: Soft. She exhibits no mass. There is no hepatomegaly. There is no abdominal tenderness.  Musculoskeletal:        General: No edema.  Lymphadenopathy:    She has no cervical adenopathy.   Neurological: She is alert and oriented to person, place, and time. She has normal strength. No cranial nerve deficit.  Otherwise stable gait with no assistance.  Skin: Skin is warm. No rash noted. No erythema.  Psychiatric: Her mood appears anxious.  Well groomed, good eye contact.     ASSESSMENT AND PLAN:   Ms. Melanie [name] was seen today for 4 months follow-up.  No orders of the defined types were placed in this encounter.   Anxiety disorder We discussed other pharmacologic options as  well as psychotherapy. She is not interested in psychotherapy for now. She is afraid of developing side effects from medications. In general she feels like she is doing better and for now prefers to continue just Ativan. Instructed about warning signs. Follow-up in 3 to 4 months, before if needed.  Essential hypertension BP adequately controlled. No changes in Cozaar 50 mg daily. Continue low-salt diet. Eye exam is current. Continue monitoring BP.    25 min face to face OV. > 50% was dedicated to discussion of Dx, prognosis, treatment options, and some side effects of medications. Anxious,she needs a lot of reassuring.      Betty G. Martinique, MD  [facility]. [place] office.

## 2019-01-11 NOTE — Assessment & Plan Note (Signed)
We discussed other pharmacologic options as well as psychotherapy. She is not interested in psychotherapy for now. She is afraid of developing side effects from medications. In general she feels like she is doing better and for now prefers to continue just Ativan. Instructed about warning signs. Follow-up in 3 to 4 months, before if needed.

## 2019-01-11 NOTE — Assessment & Plan Note (Signed)
BP adequately controlled. No changes in Cozaar 50 mg daily. Continue low-salt diet. Eye exam is current. Continue monitoring BP.

## 2019-01-28 ENCOUNTER — Encounter

## 2019-01-28 ENCOUNTER — Ambulatory Visit: Payer: Medicare Other | Admitting: Family Medicine

## 2019-02-11 ENCOUNTER — Ambulatory Visit: Payer: Medicare Other | Admitting: Family Medicine

## 2019-02-15 ENCOUNTER — Telehealth: Payer: Self-pay | Admitting: *Deleted

## 2019-02-15 NOTE — Telephone Encounter (Signed)
Rx request for Lorazepam 0.5 mg tablets, take 1/2 to 1 tablet by mouth at bedtime as needed. #30

## 2019-02-20 ENCOUNTER — Other Ambulatory Visit: Payer: Self-pay | Admitting: Family Medicine

## 2019-02-20 DIAGNOSIS — F419 Anxiety disorder, unspecified: Secondary | ICD-10-CM

## 2019-02-20 MED ORDER — LORAZEPAM 0.5 MG PO TABS: ORAL_TABLET | ORAL | 2 refills | Status: DC

## 2019-02-20 NOTE — Telephone Encounter (Signed)
Prescription for lorazepam 0.5 mg to continue half to 1 tablet daily as needed was sent to her pharmacy. Thanks, BJ

## 2019-05-07 ENCOUNTER — Other Ambulatory Visit: Payer: Self-pay | Admitting: Family Medicine

## 2019-05-13 ENCOUNTER — Ambulatory Visit: Payer: Medicare Other | Admitting: Family Medicine

## 2019-05-14 ENCOUNTER — Other Ambulatory Visit: Payer: Self-pay | Admitting: Family Medicine

## 2019-05-14 DIAGNOSIS — F419 Anxiety disorder, unspecified: Secondary | ICD-10-CM

## 2019-06-03 ENCOUNTER — Ambulatory Visit: Payer: Medicare Other | Admitting: Family Medicine

## 2019-06-07 ENCOUNTER — Other Ambulatory Visit: Payer: Self-pay

## 2019-06-07 ENCOUNTER — Encounter: Payer: Self-pay | Admitting: Family Medicine

## 2019-06-07 ENCOUNTER — Ambulatory Visit (INDEPENDENT_AMBULATORY_CARE_PROVIDER_SITE_OTHER): Payer: Medicare Other | Admitting: Family Medicine

## 2019-06-07 VITALS — BP 130/80 | HR 88 | Temp 98.1°F | Resp 16 | Ht 68.0 in | Wt 139.0 lb

## 2019-06-07 DIAGNOSIS — F418 Other specified anxiety disorders: Secondary | ICD-10-CM

## 2019-06-07 DIAGNOSIS — I10 Essential (primary) hypertension: Secondary | ICD-10-CM

## 2019-06-07 DIAGNOSIS — E039 Hypothyroidism, unspecified: Secondary | ICD-10-CM | POA: Diagnosis not present

## 2019-06-07 DIAGNOSIS — N952 Postmenopausal atrophic vaginitis: Secondary | ICD-10-CM

## 2019-06-07 DIAGNOSIS — K219 Gastro-esophageal reflux disease without esophagitis: Secondary | ICD-10-CM

## 2019-06-07 LAB — BASIC METABOLIC PANEL
BUN: 16 mg/dL (ref 6–23)
CO2: 31 mEq/L (ref 19–32)
Calcium: 9.9 mg/dL (ref 8.4–10.5)
Chloride: 98 mEq/L (ref 96–112)
Creatinine, Ser: 0.84 mg/dL (ref 0.40–1.20)
GFR: 65.26 mL/min (ref >60.00)
Glucose, Bld: 94 mg/dL (ref 70–99)
Potassium: 4.4 mEq/L (ref 3.5–5.1)
Sodium: 139 mEq/L (ref 135–145)

## 2019-06-07 LAB — TSH: TSH: 0.44 u[IU]/mL (ref 0.35–4.50)

## 2019-06-07 MED ORDER — PREMARIN 0.625 MG/GM VA CREA: 1.0000 | TOPICAL_CREAM | VAGINAL | 1 refills | Status: DC

## 2019-06-07 MED ORDER — CITALOPRAM HYDROBROMIDE 10 MG PO TABS: 10.0000 mg | ORAL_TABLET | Freq: Every day | ORAL | 1 refills | Status: DC

## 2019-06-07 MED ORDER — OMEPRAZOLE 20 MG PO CPDR: 20.0000 mg | DELAYED_RELEASE_CAPSULE | Freq: Every day | ORAL | 2 refills | Status: DC

## 2019-06-07 NOTE — Assessment & Plan Note (Addendum)
Getting worse. Side effects of Lorazepam discussed, she will increase dose from 0.5 mg daily to 0.5 mg twice daily as needed, no more than 40 tablets/month, also discuss side effects. Celexa 10 mg added to take daily. Instructed about warning signs. Follow-up in 6 weeks, before if needed.

## 2019-06-07 NOTE — Progress Notes (Signed)
HPI:   Melanie [name] is a 78 y.o. female, who is here today for chronic disease management.  She has not had new problems since her last OV. HTN, she is on Cozaar 50 mg daily. Most BP reading are 120's-130's/60-70's.  She denies unusual headache,visual changes,chest pain,dyspnea,or edema.  Lab Results  Component Value Date   CREATININE 0.72 09/04/2018   BUN 18 09/04/2018   NA 137 09/04/2018   K 4.4 09/04/2018   CL 97 09/04/2018   CO2 34 (H) 09/04/2018    Hypothyroidism: She has not been on medication for a few years. She would like to try medication again, states that she felt "good" when she was taking it. Complaining about cold hands and feet + hair loss, these have been going on for a while and stable. She has not identified exacerbating or alleviating factors. Negative for cyanosis or ulcers.  She has not noted dysphagia,changes in bowel habits,worsening tremor, heat intolerance, or abnormal weight loss.  Lab Results  Component Value Date   TSH 0.43 07/02/2018   Anxiety getting worse, she would like to increase dose of lorazepam and add another medication if necessary. She is having what she called "panic attacks" Feels like she cannot move,fear and nervous. Problem has been worse sine the death of her husband. She has not tolerated medications well,unspecific side effects. Usually she decides not to take medication because afraid of side effects,aggravates anxiety.  Denies depressed mood or suicidal thoughts.  Today she is also complaining about vaginal itching, which has been going on for a while but seems to be getting worse for the past 2 to 3 months. She has tried OTC vaginal creams, did not help. She has not noted vaginal discharge, pain, or bleeding.  GERD: Omeprazole 20 mg, which she takes daily. Medication has helped with epigastric discomfort and heartburn. Negative for nausea, vomiting, terminal pain, or melena.  Review of Systems   Constitutional: Negative for activity change, appetite change and fatigue.  HENT: Negative for mouth sores and sore throat.   Gastrointestinal: Negative for abdominal pain, nausea and vomiting.  Genitourinary: Negative for decreased urine volume, difficulty urinating, hematuria, pelvic pain and vaginal pain.  Skin: Negative for rash and wound.  Neurological: Negative for syncope and weakness.  Psychiatric/Behavioral: Negative for confusion and hallucinations.  Rest see pertinent positives and negatives per HPI.   Current Outpatient Medications on File Prior to Visit  Medication Sig Dispense Refill  . acetaminophen (TYLENOL) 650 MG CR tablet Take 650 mg by mouth every 8 (eight) hours as needed.    . Calcium Carbonate (CALCIUM 600 PO) Take 1 tablet by mouth daily.    . fluticasone (FLONASE) 50 MCG/ACT nasal spray Place 1 spray into both nostrils 2 (two) times daily. 16 g 3  . loperamide (IMODIUM) 2 MG capsule Take 2 mg by mouth as needed.    Marland Kitchen LORazepam (ATIVAN) 0.5 MG tablet TAKE 1/2 TO 1 TABLET BY MOUTH EVERY NIGHT AT BEDTIME AS NEEDED 30 tablet 3  . losartan (COZAAR) 50 MG tablet TAKE 1 TABLET BY MOUTH DAILY 90 tablet 3  . Red Yeast Rice 600 MG CAPS Take 600 mg by mouth 2 (two) times daily.    Marland Kitchen Zoster Vaccine Adjuvanted [facility]) injection 0.5 ml in muscle and repeat in 8 weeks 0.5 mL 1   No current facility-administered medications on file prior to visit.      Past Medical History:  Diagnosis Date  . Anxiety   .  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 Thyroid disease    Allergies  Allergen Reactions  . Latex Rash  . Macrodantin Shortness Of Breath  . Guaifenesin Er Itching  . Morphine And Related Nausea Only  . Ditropan [Oxybutynin]     Causes diarrhea - contains Magnesium  . Lipitor [Atorvastatin]     rash  . Lisinopril     Diarrhea/cough  . Magnesium-Containing Compounds     Causes diarrhea  . Pravastatin     Leg cramps  . Zoloft [Sertraline Hcl]     Diarrhea    Social  History   Socioeconomic History  . Marital status: Married    Spouse name: Not on file  . Number of children: Not on file  . Years of education: Not on file  . Highest education level: Not on file  Occupational History  . Not on file  Social Needs  . Financial resource strain: Not on file  . Food insecurity    Worry: Not on file    Inability: Not on file  . Transportation needs    Medical: Not on file    Non-medical: Not on file  Tobacco Use  . Smoking status: Former Smoker    Types: Cigarettes  . Smokeless tobacco: Never Used  Substance and Sexual Activity  . Alcohol use: No  . Drug use: No  . Sexual activity: Not on file  Lifestyle  . Physical activity    Days per week: Not on file    Minutes per session: Not on file  . Stress: Not on file  Relationships  . Social Herbalist on phone: Not on file    Gets together: Not on file    Attends religious service: Not on file    Active member of club or organization: Not on file    Attends meetings of clubs or organizations: Not on file    Relationship status: Not on file  Other Topics Concern  . Not on file  Social History Narrative  . Not on file    Vitals:   06/07/19 1026  BP: 130/80  Pulse: 88  Resp: 16  Temp: 98.1 F (36.7 C)  SpO2: 98%   Body mass index is 21.13 kg/m.   Physical Exam  Nursing note and vitals reviewed. Constitutional: She is oriented to person, place, and time. She appears well-developed and well-nourished. No distress.  HENT:  Head: Normocephalic and atraumatic.  Mouth/Throat: Oropharynx is clear and moist and mucous membranes are normal.  Eyes: Pupils are equal, round, and reactive to light. Conjunctivae are normal.  Cardiovascular: Normal rate and regular rhythm.  No murmur heard. Pulses:      Dorsalis pedis pulses are 2+ on the right side and 2+ on the left side.  Respiratory: Effort normal and breath sounds normal. No respiratory distress.  GI: Soft. She exhibits no  mass. There is no hepatomegaly. There is no abdominal tenderness.  Genitourinary: There is no rash, tenderness or lesion on the right labia. There is no rash, tenderness or lesion on the left labia.    No vaginal discharge, erythema, tenderness or bleeding.  No erythema, tenderness or bleeding in the vagina.    Genitourinary Comments: Atrophic mucosa vaginal changes.   Musculoskeletal:        General: No edema.  Lymphadenopathy:    She has no cervical adenopathy.  Neurological: She is alert and oriented to person, place, and time. She has normal strength. No cranial  nerve deficit.  Otherwise stable gait,not assisted.  Skin: Skin is warm. No rash noted. No erythema.  Psychiatric: Her mood appears anxious.  Well groomed, good eye contact.    ASSESSMENT AND PLAN:  Ms. Emary was seen today for follow-up.  Diagnoses and all orders for this visit:  Orders Placed This Encounter  Procedures  . Basic metabolic panel  . TSH    Lab Results  Component Value Date   TSH 0.44 06/07/2019   Lab Results  Component Value Date   CREATININE 0.84 06/07/2019   BUN 16 06/07/2019   NA 139 06/07/2019   K 4.4 06/07/2019   CL 98 06/07/2019   CO2 31 06/07/2019    Hypothyroidism, unspecified type Past Hx. She has had TSH's in the lower normal range. Explained that cold hands and hair loss are unspecific symptoms and not always related to thyroid disease,frequently unknown etiology. Further recommendations will be given according to TSH results.  -     TSH  Atrophic vaginitis We discussed differential diagnosis. Treatment options as well as side effects. She agrees with trying Premarin cream 2-3 times per week. Last mammogram 11/2018 [facility] 1.  -     conjugated estrogens (PREMARIN) vaginal cream; Place 1 Applicatorful vaginally 3 (three) times a week.  GERD (gastroesophageal reflux disease) Probably has been well controlled with omeprazole 20 mg daily. GERD precautions also recommended.  No changes in current management.  Essential hypertension BP adequately controlled. Continue losartan 50 mg daily. Continue low-salt diet and monitoring BP regularly. Eye exam is current. Follow-up in 6 months.   Anxiety disorder Getting worse. Side effects of Lorazepam discussed, she will increase dose from 0.5 mg daily to 0.5 mg twice daily as needed, no more than 40 tablets/month, also discuss side effects. Celexa 10 mg added to take daily. Instructed about warning signs. Follow-up in 6 weeks, before if needed.   Return in about 6 weeks (around 07/19/2019) for anxiety,vaginitis.    -Ms. Deloris L [facility] was advised to return sooner than planned today if new concerns arise.       Tyniesha Howald G. Martinique, MD  [facility]. [place] office.

## 2019-06-07 NOTE — Assessment & Plan Note (Signed)
BP adequately controlled. Continue losartan 50 mg daily. Continue low-salt diet and monitoring BP regularly. Eye exam is current. Follow-up in 6 months.

## 2019-06-07 NOTE — Assessment & Plan Note (Signed)
Probably has been well controlled with omeprazole 20 mg daily. GERD precautions also recommended. No changes in current management.

## 2019-06-07 NOTE — Patient Instructions (Addendum)
A few things to remember from today's visit:   Essential hypertension - Plan: Basic metabolic panel  Gastroesophageal reflux disease without esophagitis - Plan: omeprazole (PRILOSEC) 20 MG capsule  Other specified anxiety disorders  Hypothyroidism, unspecified type - Plan: TSH  Atrophic vaginitis - Plan: conjugated estrogens (PREMARIN) vaginal cream  Today we are adding 2 medications, 1 for anxiety and 1 for vaginal itching. Celexa 10 mg is for anxiety, take 1 tablet daily.  Vaginal cream to apply 2-3 times per week, hopefully this will help with the vaginal itching.   Please be sure medication list is accurate. If a new problem present, please set up appointment sooner than planned today.

## 2019-06-13 ENCOUNTER — Other Ambulatory Visit: Payer: Self-pay | Admitting: Family Medicine

## 2019-06-19 ENCOUNTER — Telehealth: Payer: Self-pay | Admitting: *Deleted

## 2019-06-19 NOTE — Telephone Encounter (Signed)
Message sent to Dr. Martinique for review.  Copied from [place] [phone]. Topic: General - Inquiry >> Jun 19, 2019  9:50 AM Richardo Priest, NT wrote: Reason for CRM: Patient is calling in stating that she is not taking the citalopram (CELEXA) 10 MG tablet due to it having severe side affects on her. She is curious if there is an alternative and if she is still in need of coming in soon for a follow up on this medication due to not taking it. Please advise and patient would like a call back.

## 2019-06-20 NOTE — Telephone Encounter (Signed)
We have tried several medications and reporting similar problem every time. I do not have many options at this time. What time of side effects? Most resolve after a couple weeks. She has tried Biochemist, clinical, and now celexa among some. I encourage her to continue medication for a few more days.  Other options [I am not sure is she has tried before] are Fluoxetine 10 mg,effexor 37.5 mg daily,Paxil 10 mg daily, or Mirtazapine 7.5 mg at bedtime.  Thanks, BJ

## 2019-06-21 NOTE — Telephone Encounter (Signed)
Spoke with patient and she stated that she rather not take Celexa anymore. She would like more Lorazepam sent to the pharmacy and she would like Effexor sent to the pharmacy.

## 2019-06-24 ENCOUNTER — Other Ambulatory Visit: Payer: Self-pay | Admitting: Family Medicine

## 2019-06-24 DIAGNOSIS — F418 Other specified anxiety disorders: Secondary | ICD-10-CM

## 2019-06-24 DIAGNOSIS — F419 Anxiety disorder, unspecified: Secondary | ICD-10-CM

## 2019-06-24 MED ORDER — VENLAFAXINE HCL ER 37.5 MG PO CP24: 37.5000 mg | ORAL_CAPSULE | Freq: Every day | ORAL | 1 refills | Status: DC

## 2019-06-24 MED ORDER — LORAZEPAM 0.5 MG PO TABS: ORAL_TABLET | ORAL | 1 refills | Status: DC

## 2019-06-24 NOTE — Telephone Encounter (Signed)
Rx for Effexor sent. In regard to Lorazepam 0.5 mg she had refills x 3 but because dose was changed from daily prn to bid prn(no more than 40 tabs per month),I sent a new Rx to be filled 07/10/19 (earlier because dose changed). Last Rx for Lorazepam was filled 06/17/19.  Thanks, BJ

## 2019-07-02 NOTE — Telephone Encounter (Signed)
Message sent to Dr. Jordan for review. 

## 2019-07-02 NOTE — Telephone Encounter (Signed)
venlafaxine XR (EFFEXOR XR) 37.5 MG 24 hr capsule [[id number]]   Pt called and stated that this medication is causing her blood pressure to go up. Pt stated that she took he BP at lunch time today and her bp is now 177/83

## 2019-07-05 NOTE — Telephone Encounter (Signed)
Returned call to patient, no answer will try again on Monday when office reopen.

## 2019-07-05 NOTE — Telephone Encounter (Signed)
We have tried different medications for anxiety and not well tolerated, so at this point if she is tolerating medication well, I prefer her to continue it for now. Continue monitoring BP. We can adjust her BP medication if necessary.  Thanks, BJ

## 2019-07-05 NOTE — Telephone Encounter (Signed)
Patient called CMA back.  Call back [phone]

## 2019-07-05 NOTE — Telephone Encounter (Signed)
Tried calling patient to follow-up, lm for patient to return call.

## 2019-07-09 NOTE — Telephone Encounter (Signed)
Called patient to follow-up, lm for patient to return call to clinic.

## 2019-07-10 NOTE — Telephone Encounter (Signed)
Spoke with patient and she stated that she is feeling much better, blood pressures are in the 140's/70's. Patient advised to call the office if b/p's go back up. Patient verbalized understanding.

## 2019-07-22 ENCOUNTER — Ambulatory Visit: Payer: Medicare Other | Admitting: Family Medicine

## 2019-07-25 ENCOUNTER — Other Ambulatory Visit: Payer: Self-pay | Admitting: Family Medicine

## 2019-07-25 DIAGNOSIS — K219 Gastro-esophageal reflux disease without esophagitis: Secondary | ICD-10-CM

## 2019-07-29 ENCOUNTER — Encounter: Payer: Self-pay | Admitting: Family Medicine

## 2019-07-29 ENCOUNTER — Ambulatory Visit (INDEPENDENT_AMBULATORY_CARE_PROVIDER_SITE_OTHER): Payer: Medicare Other | Admitting: Family Medicine

## 2019-07-29 ENCOUNTER — Other Ambulatory Visit: Payer: Self-pay

## 2019-07-29 VITALS — BP 180/90 | HR 85 | Temp 98.4°F | Resp 16 | Ht 68.0 in | Wt 137.0 lb

## 2019-07-29 DIAGNOSIS — I10 Essential (primary) hypertension: Secondary | ICD-10-CM

## 2019-07-29 DIAGNOSIS — F418 Other specified anxiety disorders: Secondary | ICD-10-CM | POA: Diagnosis not present

## 2019-07-29 MED ORDER — LOSARTAN POTASSIUM 100 MG PO TABS: 100.0000 mg | ORAL_TABLET | Freq: Every day | ORAL | 1 refills | Status: DC

## 2019-07-29 NOTE — Progress Notes (Signed)
HPI:   [name] is a 78 y.o. female, who is here today for chronic disease management.  She was last seen on 06/07/2019, when Effexor XR 37.5 mg was started because of worsening anxiety. She is also on lorazepam 0.5 mg daily as needed. She has tried several anxiolytic and antidepressant medication, poor tolerance.  In general she has tolerated Effexor well except for mild elevation of BP readings, noted a few days after medication was started. Initially she has some nausea but resolved a week ago. She denies vomiting or changes in bowel habits. She denies depressed mood or suicidal thoughts.  Hypertension, currently she is on losartan 50 mg daily. She denies unusual headache, chest pain, dyspnea, palpitation, diaphoresis, focal neurologic deficit, or edema. Denies gross hematuria, foamy urine, or decreased urine output.  BP 140's-150's/80's.   Lab Results  Component Value Date   CREATININE 0.84 06/07/2019   BUN 16 06/07/2019   NA 139 06/07/2019   K 4.4 06/07/2019   CL 98 06/07/2019   CO2 31 06/07/2019    Review of Systems  Constitutional: Negative for activity change, appetite change, chills, diaphoresis and fever.  Eyes: Negative for redness and visual disturbance.  Respiratory: Negative for cough and wheezing.   Gastrointestinal: Negative for abdominal pain and vomiting.       No changes in bowel habits.  Musculoskeletal: Positive for arthralgias and gait problem. Negative for joint swelling.  Neurological: Negative for syncope and facial asymmetry.  Psychiatric/Behavioral: Negative for confusion and hallucinations.  Rest see pertinent positives and negatives per HPI.   Current Outpatient Medications on File Prior to Visit  Medication Sig Dispense Refill  . acetaminophen (TYLENOL) 650 MG CR tablet Take 650 mg by mouth every 8 (eight) hours as needed.    . Calcium Carbonate (CALCIUM 600 PO) Take 1 tablet by mouth daily.    Marland Kitchen conjugated estrogens  (PREMARIN) vaginal cream Place 1 Applicatorful vaginally 3 (three) times a week. 42.5 g 1  . fluticasone (FLONASE) 50 MCG/ACT nasal spray Place 1 spray into both nostrils 2 (two) times daily. 16 g 3  . loperamide (IMODIUM) 2 MG capsule Take 2 mg by mouth as needed.    Marland Kitchen LORazepam (ATIVAN) 0.5 MG tablet 1/2-1 tab bid as needed for anxiety.No more than 40 tabs per month. 30 tablet 1  . omeprazole (PRILOSEC) 20 MG capsule Take 1 capsule (20 mg total) by mouth daily. 90 capsule 2  . Red Yeast Rice 600 MG CAPS Take 600 mg by mouth 2 (two) times daily.    Marland Kitchen venlafaxine XR (EFFEXOR XR) 37.5 MG 24 hr capsule Take 1 capsule (37.5 mg total) by mouth daily with breakfast. 30 capsule 1  . Zoster Vaccine Adjuvanted [facility]) injection 0.5 ml in muscle and repeat in 8 weeks 0.5 mL 1   No current facility-administered medications on file prior to visit.      Past Medical History:  Diagnosis Date  . Anxiety   . 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 Thyroid disease    Allergies  Allergen Reactions  . Latex Rash  . Macrodantin Shortness Of Breath  . Guaifenesin Er Itching  . Morphine And Related Nausea Only  . Ditropan [Oxybutynin]     Causes diarrhea - contains Magnesium  . Lipitor [Atorvastatin]     rash  . Lisinopril     Diarrhea/cough  . Magnesium-Containing Compounds     Causes diarrhea  . Pravastatin  Leg cramps  . Zoloft [Sertraline Hcl]     Diarrhea    Social History   Socioeconomic History  . Marital status: Married    Spouse name: Not on file  . Number of children: Not on file  . Years of education: Not on file  . Highest education level: Not on file  Occupational History  . Not on file  Social Needs  . Financial resource strain: Not on file  . Food insecurity    Worry: Not on file    Inability: Not on file  . Transportation needs    Medical: Not on file    Non-medical: Not on file  Tobacco Use  . Smoking status: Former Smoker    Types: Cigarettes  . Smokeless tobacco:  Never Used  Substance and Sexual Activity  . Alcohol use: No  . Drug use: No  . Sexual activity: Not on file  Lifestyle  . Physical activity    Days per week: Not on file    Minutes per session: Not on file  . Stress: Not on file  Relationships  . Social Herbalist on phone: Not on file    Gets together: Not on file    Attends religious service: Not on file    Active member of club or organization: Not on file    Attends meetings of clubs or organizations: Not on file    Relationship status: Not on file  Other Topics Concern  . Not on file  Social History Narrative  . Not on file    Vitals:   07/29/19 1027 07/29/19 1037  BP: (!) 160/90 (!) 180/90  Pulse: 85   Resp: 16   Temp: 98.4 F (36.9 C)   SpO2: 93%    Body mass index is 20.83 kg/m.   Physical Exam  Nursing note and vitals reviewed. Constitutional: She is oriented to person, place, and time. She appears well-developed. No distress.  HENT:  Head: Normocephalic and atraumatic.  Mouth/Throat: Oropharynx is clear and moist and mucous membranes are normal.  Eyes: Pupils are equal, round, and reactive to light. Conjunctivae are normal.  Cardiovascular: Normal rate and regular rhythm.  No murmur heard. Pulses:      Dorsalis pedis pulses are 2+ on the right side and 2+ on the left side.  Respiratory: Effort normal and breath sounds normal. No respiratory distress.  GI: Soft. She exhibits no mass. There is no abdominal tenderness.  Musculoskeletal:        General: No edema.  Lymphadenopathy:    She has no cervical adenopathy.  Neurological: She is alert and oriented to person, place, and time. She has normal strength. No cranial nerve deficit. Gait normal.  Skin: Skin is warm. No rash noted. No erythema.  Psychiatric: Her mood appears anxious.  Well groomed, good eye contact.      ASSESSMENT AND PLAN:  Ms. Melanie [name] was seen today for follow-up.  Diagnoses and all orders for this visit:  Anxiety  disorder Still not well controlled but reporting some improvement with Effexor XR 37.5 mg daily. She has not tolerated other anxiolytic medication in the past, so for now recommend continuing it. No changes in lorazepam 0.5 mg twice daily as needed. We discussed some side effects of medications. Instructed about warning signs. Follow-up in 4 weeks.  Essential hypertension BP mildly elevated. Recommend increasing dose of losartan from 50 mg to 100 mg. Low-salt diet. Continue monitoring BP regularly. Instructed about warning signs. Follow-up in  4 weeks.   Return in about 4 weeks (around 08/26/2019) for televisit.    -Ms. Keiara L Navarez was advised to return sooner than planned today if new concerns arise.     Keajah Killough G. Martinique, MD  [facility]. [place] office.

## 2019-07-29 NOTE — Assessment & Plan Note (Signed)
BP mildly elevated. Recommend increasing dose of losartan from 50 mg to 100 mg. Low-salt diet. Continue monitoring BP regularly. Instructed about warning signs. Follow-up in 4 weeks.

## 2019-07-29 NOTE — Telephone Encounter (Signed)
Losartan 100 mg- make sure it got sent to [facility] mail order.  Pt is trying to get a refill on her Omeprazole from the same mail order pharmacy.  Patient has been trying since last week to get it filled.

## 2019-07-29 NOTE — Assessment & Plan Note (Addendum)
Still not well controlled but reporting some improvement with Effexor XR 37.5 mg daily. She has not tolerated other anxiolytic medication in the past, so for now recommend continuing it. No changes in lorazepam 0.5 mg twice daily as needed. We discussed some side effects of medications. Instructed about warning signs. Follow-up in 4 weeks.

## 2019-07-29 NOTE — Patient Instructions (Addendum)
A few things to remember from today's visit:   Essential hypertension - Plan: losartan (COZAAR) 100 MG tablet  Other specified anxiety disorders No changes on Effexor for now because it is helping. Continue same dose of lorazepam.  Because your blood pressure is elevated today we increased Cozaar from 50 mg to 100 mg. Continue monitoring your blood pressure at home. We can arrange a telephone or office visit to follow your blood pressure.     Please be sure medication list is accurate. If a new problem present, please set up appointment sooner than planned today.

## 2019-07-30 NOTE — Telephone Encounter (Signed)
Pt was given 90 day supply with refill in June. Pt should be covered. Called pt to see if she had received Rx yet but no answer. Will try again shortly.

## 2019-07-31 ENCOUNTER — Other Ambulatory Visit: Payer: Self-pay | Admitting: Family Medicine

## 2019-07-31 DIAGNOSIS — F418 Other specified anxiety disorders: Secondary | ICD-10-CM

## 2019-09-17 ENCOUNTER — Telehealth: Payer: Self-pay | Admitting: *Deleted

## 2019-09-17 NOTE — Telephone Encounter (Signed)
Rx request for Ativan 0.5 mg tablets, #30, 1

## 2019-09-18 ENCOUNTER — Other Ambulatory Visit: Payer: Self-pay | Admitting: Family Medicine

## 2019-09-18 DIAGNOSIS — F419 Anxiety disorder, unspecified: Secondary | ICD-10-CM

## 2019-09-18 MED ORDER — LORAZEPAM 0.5 MG PO TABS: ORAL_TABLET | ORAL | 3 refills | Status: DC

## 2019-09-18 NOTE — Telephone Encounter (Signed)
Noted  

## 2019-09-18 NOTE — Telephone Encounter (Signed)
Rx sent. Thanks, BJ 

## 2019-09-25 ENCOUNTER — Ambulatory Visit: Payer: Self-pay | Admitting: *Deleted

## 2019-09-25 NOTE — Telephone Encounter (Signed)
   Reason for Disposition . Systolic BP  >= 0000000 OR Diastolic >= [id number]  Answer Assessment - Initial Assessment Questions 1. BLOOD PRESSURE: "What is the blood pressure?" "Did you take at least two measurements 5 minutes apart?"     158/77 this morning 169/71 4:40 pm today 2. ONSET: "When did you take your blood pressure?"      Since starting Effexor 06/2019 3. HOW: "How did you obtain the blood pressure?" (e.g., visiting nurse, automatic home BP monitor)     Automatic home BP monitor  4. HISTORY: "Do you have a history of high blood pressure?"    Yes, takes blood pressure medication 5. MEDICATIONS: "Are you taking any medications for blood pressure?" "Have you missed any doses recently?"   Takes Losartan 100 mg per day, has not missed any doses  6. OTHER SYMPTOMS: "Do you have any symptoms?" (e.g., headache, chest pain, blurred vision, difficulty breathing, weakness)     Denis all of the above, states she feels more tired than usual 7. PREGNANCY: "Is there any chance you are pregnant?" "When was your last menstrual period?"     N/A  Protocols used: HIGH BLOOD PRESSURE-A-AH  Patient states that her blood pressure has been fluctuating since starting Effexor 06/2019.  She states her readings are higher than normal frequently.  She was seen for this issue 07/29/2019 by Dr. Martinique, and Losartan was increased to 100 mg per day.  Her blood pressure continues to be elevated despite the medication increase, and she states she has not missed any doses.  Today's blood pressure readings were 158/77 and 169/71.  Patient was advised by Dr. Martinique to follow via virtual visit 08/26/2019, but patient did not know she had to schedule an appointment for this.  Patient denies any chest pain, shortness of breath, numbness, or weakness.  She states her only other symptom is that she has been feeling more tired than usual in the past couple of months.  Recommended that patient have a follow up virtual visit with Dr. Martinique  in the next 3 days to discuss blood pressure and fatigue issues.  Transferred call to PCP office for appointment to be scheduled.

## 2019-09-25 NOTE — Telephone Encounter (Signed)
Message from Rutherford Nail, [place] sent at 09/25/2019 4:26 PM EDT  Summary: BP fluctuating - feeling okay right now   Patient calling and states that she has recently noticed that her blood pressure has been fluctuating. States that she feels okay right now. Is on BP medication venlafaxine XR (EFFEXOR-XR) 37.5 MG 24 hr capsule. Per patient, has been on this for about 3 months with no issues. Please advise.

## 2019-09-27 NOTE — Telephone Encounter (Signed)
Patient scheduled for follow-up visit with Dr. Martinique on 09/30/2019.

## 2019-09-30 ENCOUNTER — Telehealth (INDEPENDENT_AMBULATORY_CARE_PROVIDER_SITE_OTHER): Payer: Medicare Other | Admitting: Family Medicine

## 2019-09-30 ENCOUNTER — Encounter: Payer: Self-pay | Admitting: Family Medicine

## 2019-09-30 ENCOUNTER — Other Ambulatory Visit: Payer: Self-pay

## 2019-09-30 VITALS — BP 134/61 | HR 84 | Ht 68.0 in

## 2019-09-30 DIAGNOSIS — F418 Other specified anxiety disorders: Secondary | ICD-10-CM

## 2019-09-30 DIAGNOSIS — R5383 Other fatigue: Secondary | ICD-10-CM | POA: Diagnosis not present

## 2019-09-30 DIAGNOSIS — I10 Essential (primary) hypertension: Secondary | ICD-10-CM

## 2019-09-30 NOTE — Progress Notes (Signed)
Virtual Visit via Telephone Note  I connected with [name] on 09/30/19 at  2:00 PM EDT by telephone and verified that I am speaking with the correct person using two identifiers.   I discussed the limitations, risks, security and privacy concerns of performing an evaluation and management service by telephone and the availability of in person appointments. I also discussed with the patient that there may be a patient responsible charge related to this service. The patient expressed understanding and agreed to proceed.  Location patient: home Location provider: work office Participants present for the call: patient, provider Patient did not have a visit in the prior 7 days to address this/these issue(s).   History of Present Illness: Melanie [name] is a 78 yo female with hx of anxiety,HTN,and peripheral neuropathy who is concerned about elevated BP. She has not a problem since she started taking Effexor XR 37.5 mg for anxiety.   She states that "sometimes is high and sometimes down." Currently she is on Cozaar 100 mg daily. She has tolerated medication well.  BP's readings: 140/78 75/min 144/67 76/min 139/68 73/min 148/69 71/min 145/73 73/min 138/71 94/min Denies severe/frequent headache, visual changes, chest pain, dyspnea, palpitation,focal weakness, or edema.  She is also complaining about fatigue, which she has had for awhile. She takes a nap daily. "Sleepy" all day. She is sleeping "pretty good" through the night.  She denies fever, chills, night sweats, abdominal pain, nausea, vomiting, or changes in bowel habits.  Lab Results  Component Value Date   TSH 0.44 06/07/2019   Lab Results  Component Value Date   CREATININE 0.84 06/07/2019   BUN 16 06/07/2019   NA 139 06/07/2019   K 4.4 06/07/2019   CL 98 06/07/2019   CO2 31 06/07/2019   Lab Results  Component Value Date   WBC 6.4 07/02/2018   HGB 13.7 07/02/2018   HCT 40.1 07/02/2018   MCV 89.5 07/02/2018    PLT 277.0 07/02/2018    Anxiety: She is not sure if Effexor 37.5 mg is helping with anxiety. She denies depressed mood or suicidal thoughts. Currently she is also on lorazepam 0.5 mg twice daily as needed.  Observations/Objective: Patient sounds cheerful and well on the phone. I do not appreciate any SOB. Speech and thought processing are grossly intact. + Anxious. Patient reported vitals:BP 134/61   Pulse 84   Ht 5\' 8"  (1.727 m)   BMI 20.83 kg/m    Assessment and Plan:  1. Essential hypertension We discussed current recommendations in regard to BP goal for her age, < 150/90. Certainly Effexor could be causing BP elevation, so discontinued. Continue losartan 100 mg daily. We discussed possible complications of elevated BP. Continue low-salt diet and continue monitoring BP regularly. If BP still elevated, will need to consider adding a second agent. Follow-up in 4 weeks.  2. Other specified anxiety disorders She is not sure if Effexor is helping with anxiety. In the past he has tried different medications but has not tolerated it well. Effexor to start weaning off. Continue lorazepam 0.5 mg twice daily as needed. We discussed side effects of medications. Instructed about warning signs.  3. Other fatigue This seems to be a chronic problem. We discussed possible etiologies, which include some of her chronic medical problems and medications. Do not think further work-up is needed at this time.  Follow Up Instructions:  Return in about 4 weeks (around 10/28/2019) for axiety and HTN, virtual.  I did not refer this patient for an  OV in the next 24 hours for this/these issue(s).  I discussed the assessment and treatment plan with the patient. She was provided an opportunity to ask questions and all were answered. She agreed with the plan and demonstrated an understanding of the instructions.   The patient was advised to call back or seek an in-person evaluation if the  symptoms worsen or if the condition fails to improve as anticipated.  I provided 24 minutes of non-face-to-face time during this encounter.   Ravenna Legore Martinique, MD

## 2019-10-07 ENCOUNTER — Ambulatory Visit: Payer: Medicare Other | Admitting: Family Medicine

## 2019-11-05 ENCOUNTER — Ambulatory Visit (INDEPENDENT_AMBULATORY_CARE_PROVIDER_SITE_OTHER): Payer: Medicare Other | Admitting: Family Medicine

## 2019-11-05 ENCOUNTER — Encounter: Payer: Self-pay | Admitting: Family Medicine

## 2019-11-05 ENCOUNTER — Other Ambulatory Visit: Payer: Self-pay

## 2019-11-05 VITALS — BP 135/72 | HR 73 | Ht 68.0 in

## 2019-11-05 DIAGNOSIS — I10 Essential (primary) hypertension: Secondary | ICD-10-CM | POA: Diagnosis not present

## 2019-11-05 DIAGNOSIS — J3089 Other allergic rhinitis: Secondary | ICD-10-CM | POA: Diagnosis not present

## 2019-11-05 DIAGNOSIS — F419 Anxiety disorder, unspecified: Secondary | ICD-10-CM

## 2019-11-05 DIAGNOSIS — F418 Other specified anxiety disorders: Secondary | ICD-10-CM

## 2019-11-05 MED ORDER — MONTELUKAST SODIUM 10 MG PO TABS: 10.0000 mg | ORAL_TABLET | Freq: Every day | ORAL | 3 refills | Status: DC

## 2019-11-05 MED ORDER — LORAZEPAM 0.5 MG PO TABS: ORAL_TABLET | ORAL | 2 refills | Status: DC

## 2019-11-05 MED ORDER — FLUTICASONE PROPIONATE 50 MCG/ACT NA SUSP: 1.0000 | Freq: Two times a day (BID) | NASAL | 3 refills | Status: DC

## 2019-11-05 MED ORDER — LOSARTAN POTASSIUM 100 MG PO TABS: 100.0000 mg | ORAL_TABLET | Freq: Every day | ORAL | 1 refills | Status: DC

## 2019-11-05 NOTE — Assessment & Plan Note (Signed)
Continue cetirizine 10 mg daily. Resume Flonase 1 spray in each nostril twice daily as needed. Nasal saline irrigations to continue as needed. Singulair 10 mg added at night. We discussed some side effects of medications. Follow-up in 3 months, before if needed.

## 2019-11-05 NOTE — Progress Notes (Signed)
Virtual Visit via Telephone Note  I connected with Melanie [name] on 11/10/20at 12:00 PM EST by telephone and verified that I am speaking with the correct person using two identifiers.   I discussed the limitations, risks, security and privacy concerns of performing an evaluation and management service by telephone and the availability of in person appointments. I also discussed with the patient that there may be a patient responsible charge related to this service. The patient expressed understanding and agreed to proceed.  Location patient: home Location provider: work office Participants present for the call: patient, provider Patient did not have a visit in the prior 7 days to address this/these issue(s).   History of Present Illness: Melanie [name] is a 78 yo female with history of hypertension, anxiety, and peripheral neuropathy who is following on some of her chronic medical problems. Last telephone visit on 09/30/2019, when she was concerned about elevated BP. She increased losartan from 50 mg to 100 mg. She has tolerated medication well. Home BP readings: 136/75, and 34/67, 132/70, 143/71, 131/70, 139/73, 140/69, 143/71, and 132/71.  She denies unusual headache, visual changes, chest pain, dyspnea, palpitations, abdominal pain, nausea, vomiting, or edema.  She is also complaining about clear rhinorrhea for the past few weeks, it seems to be worse in the morning. Postnasal drainage and productive cough with clearish sputum. She has not noted associated wheezing. Nose and I pruritus. She has not noted eye drainage. She denies fever, chills, unusual fatigue, or body aches. No sick contact. No changes in the smell or taste.  Currently she is on cetirizine 10 mg in the morning. Last year she was on Flonase nasal spray, which helped. She is also doing nasal saline irrigations once daily.   Anxiety: She already weaned off Effexor. She wonders if she can increase lorazepam  dose. Currently she is on lorazepam 0.5 mg daily, in the past we discussed increasing dose to twice daily but she is still receiving 30 tablets/month. She takes lorazepam 0.5 mg every night to help her sleep and sometimes she feels she needs to take medication in the morning. She denies depressed mood or suicidal thoughts. She has tried different SSRIs and SNRIs, poorly tolerated.  Observations/Objective: Patient sounds cheerful and well on the phone. I do not appreciate any SOB. Speech and thought processing are grossly intact. Patient reported vitals:  Assessment and Plan:  Allergic rhinitis Continue cetirizine 10 mg daily. Resume Flonase 1 spray in each nostril twice daily as needed. Nasal saline irrigations to continue as needed. Singulair 10 mg added at night. We discussed some side effects of medications. Follow-up in 3 months, before if needed.  Essential hypertension BP better controlled. Continue losartan 100 mg daily. Continue low-salt diet. Monitor BP regularly. Follow-up in 3 months.  Anxiety disorder Slightly worse for the past few months. We discussed side effects of lorazepam, she has been on medication for years and it has been well-tolerated. Continue lorazepam 0.5 mg twice daily as needed, no more than 45 tablets/month. She has 25 tablets at this time, she will call to the pharmacy 2 to 3 days before she runs out and a new prescription will be sent for #45 tabs. Instructed about warning signs.   Follow Up Instructions:  Return in about 3 months (around 02/05/2020).  I did not refer this patient for an OV in the next 24 hours for this/these issue(s).  I discussed the assessment and treatment plan with the patient. She was provided an opportunity to ask  questions and all were answered. She  agreed with the plan and demonstrated an understanding of the instructions.   The patient was advised to call back or seek an in-person evaluation if the symptoms worsen  or if the condition fails to improve as anticipated.  I provided 21 minutes of non-face-to-face time during this encounter.   Betty Martinique, MD

## 2019-11-05 NOTE — Assessment & Plan Note (Signed)
BP better controlled. Continue losartan 100 mg daily. Continue low-salt diet. Monitor BP regularly. Follow-up in 3 months.

## 2019-11-05 NOTE — Assessment & Plan Note (Signed)
Slightly worse for the past few months. We discussed side effects of lorazepam, she has been on medication for years and it has been well-tolerated. Continue lorazepam 0.5 mg twice daily as needed, no more than 45 tablets/month. She has 25 tablets at this time, she will call to the pharmacy 2 to 3 days before she runs out and a new prescription will be sent for #45 tabs. Instructed about warning signs.

## 2020-01-01 ENCOUNTER — Telehealth: Payer: Self-pay | Admitting: Family Medicine

## 2020-01-01 ENCOUNTER — Other Ambulatory Visit: Payer: Self-pay | Admitting: Family Medicine

## 2020-01-01 DIAGNOSIS — F419 Anxiety disorder, unspecified: Secondary | ICD-10-CM

## 2020-01-01 MED ORDER — LORAZEPAM 0.5 MG PO TABS: ORAL_TABLET | ORAL | 2 refills | Status: DC

## 2020-01-01 NOTE — Telephone Encounter (Signed)
It looks like Rx was changed but wasn't sent to the pharmacy, can you send it?

## 2020-01-01 NOTE — Telephone Encounter (Signed)
I called and spoke with pt, she is aware Rx has been sent in.

## 2020-01-01 NOTE — Telephone Encounter (Signed)
Updated Rx sent. Thanks, BJ

## 2020-01-01 NOTE — Telephone Encounter (Signed)
Pt called in, pt would like to touch base with provider about LORazepam (ATIVAN) 0.5 MG tablet, pt says that she spoke with PCP about getting a few more with her Rx due to her currently having a lot happening in her life with family.   Pt says that she haven't heard anything back and would like to discuss further.

## 2020-01-14 ENCOUNTER — Telehealth: Payer: Self-pay | Admitting: Family Medicine

## 2020-01-14 NOTE — Telephone Encounter (Signed)
Pt called stating she has been having trouble with one of her medications she believes. Pt states she has been feeling winded, coughing during the day, and she has no energy. Pt states she states she has been feeling like this for a few weeks. Pt is requesting to have advice. Pt denied speaking with a triage nurse. Please advise.     [facility] [place], [place] - [address]  [address] [place] [address]  Phone: [phone] Fax: [phone]  Not a 24 hour pharmacy; exact hours not known.

## 2020-01-14 NOTE — Telephone Encounter (Signed)
Can you offer pt a televisit for tomorrow afternoon? Thanks!

## 2020-01-14 NOTE — Telephone Encounter (Signed)
Patient is scheduled for 01/15/2020 for a telephone visit at 3 PM

## 2020-01-15 ENCOUNTER — Encounter: Payer: Self-pay | Admitting: Family Medicine

## 2020-01-15 ENCOUNTER — Telehealth (INDEPENDENT_AMBULATORY_CARE_PROVIDER_SITE_OTHER): Payer: Medicare Other | Admitting: Family Medicine

## 2020-01-15 VITALS — BP 140/62 | HR 76

## 2020-01-15 DIAGNOSIS — K219 Gastro-esophageal reflux disease without esophagitis: Secondary | ICD-10-CM | POA: Diagnosis not present

## 2020-01-15 DIAGNOSIS — R05 Cough: Secondary | ICD-10-CM | POA: Diagnosis not present

## 2020-01-15 DIAGNOSIS — J3089 Other allergic rhinitis: Secondary | ICD-10-CM | POA: Diagnosis not present

## 2020-01-15 DIAGNOSIS — F419 Anxiety disorder, unspecified: Secondary | ICD-10-CM

## 2020-01-15 DIAGNOSIS — R059 Cough, unspecified: Secondary | ICD-10-CM

## 2020-01-15 MED ORDER — BENZONATATE 100 MG PO CAPS: 200.0000 mg | ORAL_CAPSULE | Freq: Two times a day (BID) | ORAL | 0 refills | Status: AC | PRN

## 2020-01-15 MED ORDER — CETIRIZINE HCL 10 MG PO TABS: 5.0000 mg | ORAL_TABLET | Freq: Every day | ORAL | 1 refills | Status: DC

## 2020-01-15 NOTE — Progress Notes (Signed)
Virtual Visit via Telephone Note  I connected with Melanie [name] on 01/16/20 at  3:30 PM EST by telephone and verified that I am speaking with the correct person using two identifiers.   I discussed the limitations, risks, security and privacy concerns of performing an evaluation and management service by telephone and the availability of in person appointments. I also discussed with the patient that there may be a patient responsible charge related to this service. The patient expressed understanding and agreed to proceed.  Location patient: home Location provider: work or home office Participants present for the call: patient, provider Patient did not have a visit in the prior 7 days to address this/these issue(s).   History of Present Illness: Melanie [name] is a 79 yo female with Hx of anxiety,HTN,and GERD c/o a week of non productive cough. "Little bit winded" sometimes but no more than usual.  She coughs "all day", embarrassed because it usually happens when she is out getting groceries. She has not identified exacerbating or alleviating factors.  Symptoms are not aggravated by exertion. She thinks she had some wheezing last week. + Former smoker.  She thinks Singular is causing these symptoms, "giving me asthma." Singulair was started last visit ,11/05/19,because persistent rhinorrhea. She is not longer having rhinorrhea or nasal congestion. No changes in smell or taste.  She is on Flonase nasal spray daily as needed.  She is on Omeprazole 20 mg daily,has not noted heartburn or acid reflulx. Cough does not happened at night.  Her appetite is "not good" and "no energy at all." No associated fever,chills,sore throat, CP,palpitations,or diaphoresis. No sick contact.  She takes Lorazepam 0.5 mg bid prn,which helps with some of above symptoms.  No side effects. Medication is helping her sleep.  Observations/Objective: Patient sounds cheerful and well on the phone. I do  not appreciate any SOB,cough,or stridor. Speech and thought processing are grossly intact. Patient reported vitals:BP 140/62   Pulse 76   Assessment and Plan: 1. Gastroesophageal reflux disease without esophagitis Problem seems to be well controlled with omeprazole 20 mg daily. Still this could be contributing to cough. No changes in current management. Continue GERD precautions.  2. Cough We discussed possible etiologies, including allergies, GERD, and COPD among some. Explained that it is difficult to make an evaluation through the phone but she does not seem to be in acute respiratory distress and did not cough during the whole visit. For now I do not think she needs imaging but she was clearly instructed about warning signs.  - benzonatate (TESSALON) 100 MG capsule; Take 2 capsules (200 mg total) by mouth 2 (two) times daily as needed for up to 10 days.  Dispense: 40 capsule; Refill: 0  3. Non-seasonal allergic rhinitis due to other allergic trigger Cetirizine 5 mg in the morning added today. Continue Singulair 10 mg at night and Flonase nasal spray. Nasal saline irrigations as needed may also help.  - cetirizine (ZYRTEC) 10 MG tablet; Take 0.5 tablets (5 mg total) by mouth daily.  Dispense: 30 tablet; Refill: 1  4. Anxiety disorder, unspecified type Problem has improved, Alprazolam helps. At the end of her visit she states that she feels better after. She lives alone and needed to talk with somebody.     Follow Up Instructions:  Return in about 3 weeks (around 02/05/2020) for cough,HTN.  I did not refer this patient for an OV in the next 24 hours for this/these issue(s).  I discussed the assessment and treatment plan  with the patient. The patient was provided an opportunity to ask questions and all were answered. The patient agreed with the plan and demonstrated an understanding of the instructions.   The patient was advised to call back or seek an in-person evaluation if  the symptoms worsen or if the condition fails to improve as anticipated.  I provided 21-22 minutes of non-face-to-face time during this encounter.   Stpehanie Montroy [place], MD

## 2020-02-04 ENCOUNTER — Other Ambulatory Visit: Payer: Self-pay

## 2020-02-05 ENCOUNTER — Ambulatory Visit (INDEPENDENT_AMBULATORY_CARE_PROVIDER_SITE_OTHER): Payer: Medicare Other | Admitting: Family Medicine

## 2020-02-05 ENCOUNTER — Encounter: Payer: Self-pay | Admitting: Family Medicine

## 2020-02-05 VITALS — BP 148/80 | HR 88 | Resp 16 | Ht 68.0 in | Wt 135.0 lb

## 2020-02-05 DIAGNOSIS — F419 Anxiety disorder, unspecified: Secondary | ICD-10-CM | POA: Diagnosis not present

## 2020-02-05 DIAGNOSIS — K219 Gastro-esophageal reflux disease without esophagitis: Secondary | ICD-10-CM

## 2020-02-05 DIAGNOSIS — R5383 Other fatigue: Secondary | ICD-10-CM

## 2020-02-05 DIAGNOSIS — R05 Cough: Secondary | ICD-10-CM

## 2020-02-05 DIAGNOSIS — R0602 Shortness of breath: Secondary | ICD-10-CM

## 2020-02-05 DIAGNOSIS — E039 Hypothyroidism, unspecified: Secondary | ICD-10-CM | POA: Diagnosis not present

## 2020-02-05 DIAGNOSIS — R059 Cough, unspecified: Secondary | ICD-10-CM

## 2020-02-05 DIAGNOSIS — I10 Essential (primary) hypertension: Secondary | ICD-10-CM

## 2020-02-05 LAB — BASIC METABOLIC PANEL
BUN: 13 mg/dL (ref 6–23)
CO2: 31 mEq/L (ref 19–32)
Calcium: 10.1 mg/dL (ref 8.4–10.5)
Chloride: 98 mEq/L (ref 96–112)
Creatinine, Ser: 0.73 mg/dL (ref 0.40–1.20)
GFR: 76.61 mL/min (ref >60.00)
Glucose, Bld: 102 mg/dL — ABNORMAL HIGH (ref 70–99)
Potassium: 3.7 mEq/L (ref 3.5–5.1)
Sodium: 138 mEq/L (ref 135–145)

## 2020-02-05 LAB — TSH: TSH: 0.41 u[IU]/mL (ref 0.35–4.50)

## 2020-02-05 LAB — CBC WITH DIFFERENTIAL/PLATELET
Basophils Absolute: 0 10*3/uL (ref 0.0–0.1)
Basophils Relative: 0.7 % (ref 0.0–3.0)
Eosinophils Absolute: 0.1 10*3/uL (ref 0.0–0.7)
Eosinophils Relative: 2.6 % (ref 0.0–5.0)
HCT: 41.5 % (ref 36.0–46.0)
Hemoglobin: 13.9 g/dL (ref 12.0–15.0)
Lymphocytes Relative: 17.2 % (ref 12.0–46.0)
Lymphs Abs: 1 10*3/uL (ref 0.7–4.0)
MCHC: 33.4 g/dL (ref 30.0–36.0)
MCV: 87 fl (ref 78.0–100.0)
Monocytes Absolute: 0.5 10*3/uL (ref 0.1–1.0)
Monocytes Relative: 9 % (ref 3.0–12.0)
Neutro Abs: 3.9 10*3/uL (ref 1.4–7.7)
Neutrophils Relative %: 70.5 % (ref 43.0–77.0)
Platelets: 273 10*3/uL (ref 150.0–400.0)
RBC: 4.77 Mil/uL (ref 3.87–5.11)
RDW: 13.1 % (ref 11.5–15.5)
WBC: 5.6 10*3/uL (ref 4.0–10.5)

## 2020-02-05 MED ORDER — FAMOTIDINE 20 MG PO TABS: 20.0000 mg | ORAL_TABLET | Freq: Two times a day (BID) | ORAL | 3 refills | Status: DC

## 2020-02-05 MED ORDER — PANTOPRAZOLE SODIUM 20 MG PO TBEC: DELAYED_RELEASE_TABLET | ORAL | 3 refills | Status: DC

## 2020-02-05 NOTE — Progress Notes (Signed)
HPI:   Melanie [name] is a 79 y.o. female, who is here today with her daughter in law for chronic disease management. Hearing loss is getting worse, daughter in law helps with interrogation. Last visit,virtual, 01/15/20. Last visit she was c/o cough. To treat possible allergic etiology,singulair was recommended.  Cough is not productive. No associated wheezing.  She has not identified exacerbating or alleviating factors.  Singulair made her "sick." Nausea started 2-3 weeks after starting Singulair and mildly better after discontinuation of medication. Benzonatate has helped with cough. Cough in the past and improved with Zantac.  GERD on Omeprazole 20 mg. She has not noted heartburn. + Burping.  O2 sat here in the office was initially 87-88,and 89%. When she removed mask 91%.    Hx of hypothyroidism, not longer on thyroid hormone supplementation.  Lab Results  Component Value Date   TSH 0.44 06/07/2019   BP readings: 130/90, 136/65,147/76,and 161/80. Negative for unusual headache,visual changes,orthopnea,PND,or edema.  Lab Results  Component Value Date   CREATININE 0.84 06/07/2019   BUN 16 06/07/2019   NA 139 06/07/2019   K 4.4 06/07/2019   CL 98 06/07/2019   CO2 31 06/07/2019   She is also c/o SOB, which is not a new problem, she states. It is exacerbated by anxiety and she feels like Lorazepam helps. Medication dose was increased a few months ago. She has not tolerated SSRI's or SNRI's well. Denies associated CP,diaphoresis,or palpitations.  Feels "weak", gets tired easier when vacuuming or with other house chores she is used to do. This has been gradual. Alleviated by rest.  Denies fever,chills,sore throat,abdominal pain,vomiting,or urinary symptoms.  Lab Results  Component Value Date   WBC 6.4 07/02/2018   HGB 13.7 07/02/2018   HCT 40.1 07/02/2018   MCV 89.5 07/02/2018   PLT 277.0 07/02/2018    Review of Systems  Constitutional: Positive  for activity change and appetite change.  HENT: Negative for mouth sores, nosebleeds and trouble swallowing.   Respiratory: Negative for stridor.   Gastrointestinal:       Negative for changes in bowel habits.  Genitourinary: Negative for decreased urine volume, difficulty urinating, dysuria and hematuria.  Musculoskeletal: Positive for arthralgias and gait problem.  Skin: Negative for rash and wound.  Neurological: Negative for syncope, facial asymmetry and speech difficulty.  Psychiatric/Behavioral: Negative for confusion and hallucinations. The patient is nervous/anxious.   Rest of ROS, see pertinent positives sand negatives in HPI  Current Outpatient Medications on File Prior to Visit  Medication Sig Dispense Refill  . acetaminophen (TYLENOL) 650 MG CR tablet Take 650 mg by mouth every 8 (eight) hours as needed.    . Calcium Carbonate (CALCIUM 600 PO) Take 1 tablet by mouth daily.    . cetirizine (ZYRTEC) 10 MG tablet Take 0.5 tablets (5 mg total) by mouth daily. 30 tablet 1  . conjugated estrogens (PREMARIN) vaginal cream Place 1 Applicatorful vaginally 3 (three) times a week. 42.5 g 1  . fluticasone (FLONASE) 50 MCG/ACT nasal spray Place 1 spray into both nostrils 2 (two) times daily. 16 g 3  . loperamide (IMODIUM) 2 MG capsule Take 2 mg by mouth as needed.    Marland Kitchen LORazepam (ATIVAN) 0.5 MG tablet 1/2-1 tab bid as needed for anxiety.No more than 50 tabs per month. 50 tablet 2  . losartan (COZAAR) 100 MG tablet Take 1 tablet (100 mg total) by mouth daily. 90 tablet 1  . montelukast (SINGULAIR) 10 MG tablet Take 1  tablet (10 mg total) by mouth at bedtime. 30 tablet 3  . Red Yeast Rice 600 MG CAPS Take 600 mg by mouth 2 (two) times daily.    Marland Kitchen Zoster Vaccine Adjuvanted [facility]) injection 0.5 ml in muscle and repeat in 8 weeks 0.5 mL 1   No current facility-administered medications on file prior to visit.     Past Medical History:  Diagnosis Date  . Anxiety   . 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 Thyroid disease    Allergies  Allergen Reactions  . Latex Rash  . Macrodantin Shortness Of Breath  . Guaifenesin Er Itching  . Morphine And Related Nausea Only  . Ditropan [Oxybutynin]     Causes diarrhea - contains Magnesium  . Lipitor [Atorvastatin]     rash  . Lisinopril     Diarrhea/cough  . Magnesium-Containing Compounds     Causes diarrhea  . Pravastatin     Leg cramps  . Zoloft [Sertraline Hcl]     Diarrhea    Social History   Socioeconomic History  . Marital status: Married    Spouse name: Not on file  . Number of children: Not on file  . Years of education: Not on file  . Highest education level: Not on file  Occupational History  . Not on file  Tobacco Use  . Smoking status: Former Smoker    Types: Cigarettes  . Smokeless tobacco: Never Used  Substance and Sexual Activity  . Alcohol use: No  . Drug use: No  . Sexual activity: Not on file  Other Topics Concern  . Not on file  Social History Narrative  . Not on file   Social Determinants of Health   Financial Resource Strain:   . Difficulty of Paying Living Expenses: Not on file  Food Insecurity:   . Worried About Charity fundraiser in the Last Year: Not on file  . Ran Out of Food in the Last Year: Not on file  Transportation Needs:   . Lack of Transportation (Medical): Not on file  . Lack of Transportation (Non-Medical): Not on file  Physical Activity:   . Days of Exercise per Week: Not on file  . Minutes of Exercise per Session: Not on file  Stress:   . Feeling of Stress : Not on file  Social Connections:   . Frequency of Communication with Friends and Family: Not on file  . Frequency of Social Gatherings with Friends and Family: Not on file  . Attends Religious Services: Not on file  . Active Member of Clubs or Organizations: Not on file  . Attends Archivist Meetings: Not on file  . Marital Status: Not on file    Vitals:   02/05/20 1021  BP: (!) 148/80    Pulse: 88  Resp: 16  SpO2: 91%   Wt Readings from Last 3 Encounters:  02/05/20 135 lb (61.2 kg)  07/29/19 137 lb (62.1 kg)  06/07/19 139 lb (63 kg)    Body mass index is 20.53 kg/m.   Physical Exam  Nursing note and vitals reviewed. Constitutional: She is oriented to person, place, and time. She appears well-developed and well-nourished. No distress.  HENT:  Head: Normocephalic and atraumatic.  Mouth/Throat: Oropharynx is clear and moist and mucous membranes are normal.  Eyes: Pupils are equal, round, and reactive to light. Conjunctivae are normal.  Cardiovascular: Normal rate and regular rhythm.  No murmur heard. Pulses:  Dorsalis pedis pulses are 2+ on the right side and 2+ on the left side.  Respiratory: Effort normal and breath sounds normal. No respiratory distress.  GI: Soft. There is no abdominal tenderness.  + Burping a few times during visit.  Musculoskeletal:        General: No edema.  Lymphadenopathy:    She has no cervical adenopathy.  Neurological: She is alert and oriented to person, place, and time. She has normal strength. No cranial nerve deficit.  Mildly unstable gait with no assistance.  Skin: Skin is warm. No rash noted. No erythema.  Psychiatric: Her mood appears anxious.  Well groomed, good eye contact.     ASSESSMENT AND PLAN:   Melanie [name] was seen today for chronic disease management.  Orders Placed This Encounter  Procedures  . DG Chest 2 View  . TSH  . CBC with Differential/Platelet  . Basic metabolic panel  . Ambulatory referral to Home Health  . EKG 12-Lead   Lab Results  Component Value Date   TSH 0.41 02/05/2020   Lab Results  Component Value Date   WBC 5.6 02/05/2020   HGB 13.9 02/05/2020   HCT 41.5 02/05/2020   MCV 87.0 02/05/2020   PLT 273.0 02/05/2020   Lab Results  Component Value Date   CREATININE 0.73 02/05/2020   BUN 13 02/05/2020   NA 138 02/05/2020   K 3.7 02/05/2020   CL 98 02/05/2020    CO2 31 02/05/2020    1. SOB (shortness of breath) She attributes problem to anxiety. ? Deconditioning. Other possible etiologies discussed. EKG: SR,normal axis and intervals,no signs of acute ischemia. No other EKG for comparison. Lung auscultation negative. Clearly instructed about warning signs.  HH will be arranged to monitor O2 sats.   - EKG 12-Lead - Ambulatory referral to Home Health  2. Gastroesophageal reflux disease without esophagitis This problem could be contributing to cough. Omeprazole stopped. Protonix 20 mg daily and Pepcid 20 mg bid started today. GERD precautions.  - famotidine (PEPCID) 20 MG tablet; Take 1 tablet (20 mg total) by mouth 2 (two) times daily.  Dispense: 60 tablet; Refill: 3 - pantoprazole (PROTONIX) 20 MG tablet; 1 tab 30 min before breakfast. Stop omeprazole.  Dispense: 30 tablet; Refill: 3  3. Essential hypertension Today BP mildly elevated but otherwise adequate for her age. Home BP readings most of the time < 140/90. No changes in Losartan dose. Continue low salt diet. Continue monitoring BP.  No changes in current management. - Basic metabolic panel - Ambulatory referral to [facility]  4. Anxiety disorder, unspecified type No changes in Lorazepam. Side effects discussed. Could be contributing to some of reported symptoms.  5. Hypothyroidism, unspecified type Further recommendations will be given according to TSH result.  6. Cough Possible etiologies discussed:Allergies and GERD among some. Instructed about warning signs. Further recommendations according to CXR results.  7. Other fatigue Possible causes discussed. Further recommendations will be given according to lab results.   Return in about 2 months (around 04/04/2020).    Lequisha Cammack G. [place], MD  [facility]. [place] office.

## 2020-02-05 NOTE — Patient Instructions (Addendum)
A few things to remember from today's visit:   Gastroesophageal reflux disease without esophagitis - Plan: famotidine (PEPCID) 20 MG tablet, pantoprazole (PROTONIX) 20 MG tablet  Essential hypertension - Plan: Basic metabolic panel  Anxiety disorder, unspecified type  Hypothyroidism, unspecified type - Plan: TSH  Cough - Plan: DG Chest 2 View  Other fatigue - Plan: CBC with Differential/Platelet  SOB (shortness of breath) - Plan: EKG 12-Lead  Today we are stopping Omeprazole. Start Protonix 20 mg and pepcid 20 mg 2 times daily.  Cough can be due to allergies or acid reflux.  Continue monitoring blood pressure.  Goal blood pressure less than 150/90. No changes on Lorazepam.  Please be sure medication list is accurate. If a new problem present, please set up appointment sooner than planned today.

## 2020-02-12 ENCOUNTER — Telehealth: Payer: Self-pay | Admitting: Family Medicine

## 2020-02-12 NOTE — Telephone Encounter (Signed)
fyi

## 2020-02-12 NOTE — Telephone Encounter (Signed)
Cindy from [facility] has been unable to reach her to start the home health care. She will try again but wanted to update [place] that the pt has been unreachable

## 2020-02-14 ENCOUNTER — Telehealth: Payer: Self-pay | Admitting: Family Medicine

## 2020-02-14 DIAGNOSIS — F4320 Adjustment disorder, unspecified: Secondary | ICD-10-CM | POA: Diagnosis not present

## 2020-02-14 DIAGNOSIS — F419 Anxiety disorder, unspecified: Secondary | ICD-10-CM | POA: Diagnosis not present

## 2020-02-14 DIAGNOSIS — K219 Gastro-esophageal reflux disease without esophagitis: Secondary | ICD-10-CM | POA: Diagnosis not present

## 2020-02-14 DIAGNOSIS — Z9181 History of falling: Secondary | ICD-10-CM | POA: Diagnosis not present

## 2020-02-14 DIAGNOSIS — I10 Essential (primary) hypertension: Secondary | ICD-10-CM | POA: Diagnosis not present

## 2020-02-14 DIAGNOSIS — M5416 Radiculopathy, lumbar region: Secondary | ICD-10-CM | POA: Diagnosis not present

## 2020-02-14 DIAGNOSIS — Z87891 Personal history of nicotine dependence: Secondary | ICD-10-CM | POA: Diagnosis not present

## 2020-02-14 DIAGNOSIS — J309 Allergic rhinitis, unspecified: Secondary | ICD-10-CM | POA: Diagnosis not present

## 2020-02-14 DIAGNOSIS — E039 Hypothyroidism, unspecified: Secondary | ICD-10-CM | POA: Diagnosis not present

## 2020-02-14 DIAGNOSIS — E7800 Pure hypercholesterolemia, unspecified: Secondary | ICD-10-CM | POA: Diagnosis not present

## 2020-02-14 DIAGNOSIS — Z85048 Personal history of other malignant neoplasm of rectum, rectosigmoid junction, and anus: Secondary | ICD-10-CM | POA: Diagnosis not present

## 2020-02-14 DIAGNOSIS — N3281 Overactive bladder: Secondary | ICD-10-CM | POA: Diagnosis not present

## 2020-02-14 DIAGNOSIS — G620 Drug-induced polyneuropathy: Secondary | ICD-10-CM | POA: Diagnosis not present

## 2020-02-14 NOTE — Telephone Encounter (Signed)
[name] would like verbal orders to treat Melanie [name] (freq: 1w1 2w2 1w6 2PRN) for Hypertension and medication management   Olin Hauser can be reached at [phone]

## 2020-02-14 NOTE — Telephone Encounter (Signed)
Okay for verbal 

## 2020-02-17 ENCOUNTER — Telehealth: Payer: Self-pay | Admitting: Family Medicine

## 2020-02-17 NOTE — Telephone Encounter (Signed)
She saw Mrs. Siddoway today for PT evaluation and she finds no need for further PT treatment. She is doing very well.

## 2020-02-17 NOTE — Telephone Encounter (Signed)
fyi

## 2020-02-19 NOTE — Telephone Encounter (Signed)
Left message with office to call back for verbal order.

## 2020-02-19 NOTE — Telephone Encounter (Signed)
It is okay to give verbal authorization for requested services. Thanks, BJ 

## 2020-02-21 NOTE — Telephone Encounter (Signed)
Verbal given to [place].

## 2020-02-25 ENCOUNTER — Telehealth: Payer: Self-pay

## 2020-02-25 MED ORDER — AMLODIPINE BESYLATE 2.5 MG PO TABS: 2.5000 mg | ORAL_TABLET | Freq: Every day | ORAL | 3 refills | Status: DC

## 2020-02-25 NOTE — Telephone Encounter (Signed)
I called and spoke with pt. We went over the information below & she verbalized understanding. Rx sent for Amlodipine 2.5 mg to be taken daily at bedtime to the [facility] in [place].

## 2020-02-25 NOTE — Addendum Note (Signed)
Addended by: Rodrigo Ran on: 02/25/2020 04:26 PM   Modules accepted: Orders

## 2020-02-25 NOTE — Telephone Encounter (Signed)
Recommend adding Amlodipine 2.5 mg at bedtime. No changes in Losartan. Continue monitoring BP. Thanks, BJ

## 2020-02-25 NOTE — Telephone Encounter (Signed)
Melanie [name] called in from [facility] home. Pt's BP is 162/84 today. It has been running in the 150's over the past few days.

## 2020-03-15 DIAGNOSIS — F419 Anxiety disorder, unspecified: Secondary | ICD-10-CM | POA: Diagnosis not present

## 2020-03-15 DIAGNOSIS — F4320 Adjustment disorder, unspecified: Secondary | ICD-10-CM | POA: Diagnosis not present

## 2020-03-15 DIAGNOSIS — Z9181 History of falling: Secondary | ICD-10-CM | POA: Diagnosis not present

## 2020-03-15 DIAGNOSIS — J309 Allergic rhinitis, unspecified: Secondary | ICD-10-CM | POA: Diagnosis not present

## 2020-03-15 DIAGNOSIS — K219 Gastro-esophageal reflux disease without esophagitis: Secondary | ICD-10-CM | POA: Diagnosis not present

## 2020-03-15 DIAGNOSIS — G620 Drug-induced polyneuropathy: Secondary | ICD-10-CM | POA: Diagnosis not present

## 2020-03-15 DIAGNOSIS — Z85048 Personal history of other malignant neoplasm of rectum, rectosigmoid junction, and anus: Secondary | ICD-10-CM | POA: Diagnosis not present

## 2020-03-15 DIAGNOSIS — Z87891 Personal history of nicotine dependence: Secondary | ICD-10-CM | POA: Diagnosis not present

## 2020-03-15 DIAGNOSIS — E039 Hypothyroidism, unspecified: Secondary | ICD-10-CM | POA: Diagnosis not present

## 2020-03-15 DIAGNOSIS — N3281 Overactive bladder: Secondary | ICD-10-CM | POA: Diagnosis not present

## 2020-03-15 DIAGNOSIS — E7800 Pure hypercholesterolemia, unspecified: Secondary | ICD-10-CM | POA: Diagnosis not present

## 2020-03-15 DIAGNOSIS — I10 Essential (primary) hypertension: Secondary | ICD-10-CM | POA: Diagnosis not present

## 2020-03-15 DIAGNOSIS — M5416 Radiculopathy, lumbar region: Secondary | ICD-10-CM | POA: Diagnosis not present

## 2020-04-01 ENCOUNTER — Other Ambulatory Visit: Payer: Self-pay | Admitting: Family Medicine

## 2020-04-01 DIAGNOSIS — I10 Essential (primary) hypertension: Secondary | ICD-10-CM

## 2020-04-01 DIAGNOSIS — F419 Anxiety disorder, unspecified: Secondary | ICD-10-CM

## 2020-04-29 DIAGNOSIS — H524 Presbyopia: Secondary | ICD-10-CM | POA: Diagnosis not present

## 2020-05-11 ENCOUNTER — Telehealth: Payer: Self-pay | Admitting: Family Medicine

## 2020-05-11 NOTE — Telephone Encounter (Signed)
Please advise 

## 2020-05-11 NOTE — Telephone Encounter (Signed)
Pt states that the [facility] have stopped coming and stated she has completed it. Pt is wondering what she needs to do from here?   Pt also stated that the bp monitor she has will not go past 150 and thinks that it does not read her bp right, she is not sure what to do?   Pt can be reached at [phone]

## 2020-05-11 NOTE — Telephone Encounter (Signed)
She can bring her BP monitor with her next visit.  But if her BP monitor does not read  SBP > 150, she is going to need a new BP monitor. Thanks, BJ

## 2020-05-12 NOTE — Telephone Encounter (Signed)
Patient need to schedule an ov for more refills. 

## 2020-05-13 NOTE — Telephone Encounter (Signed)
Called pt temp phone number but no answer. Closing note.

## 2020-05-18 ENCOUNTER — Other Ambulatory Visit: Payer: Self-pay

## 2020-05-19 ENCOUNTER — Encounter: Payer: Self-pay | Admitting: Family Medicine

## 2020-05-19 ENCOUNTER — Ambulatory Visit (INDEPENDENT_AMBULATORY_CARE_PROVIDER_SITE_OTHER): Payer: Medicare Other | Admitting: Family Medicine

## 2020-05-19 VITALS — BP 140/70 | HR 71 | Temp 97.9°F | Resp 16 | Ht 68.0 in | Wt 131.4 lb

## 2020-05-19 DIAGNOSIS — I10 Essential (primary) hypertension: Secondary | ICD-10-CM

## 2020-05-19 DIAGNOSIS — T451X5A Adverse effect of antineoplastic and immunosuppressive drugs, initial encounter: Secondary | ICD-10-CM

## 2020-05-19 DIAGNOSIS — R0989 Other specified symptoms and signs involving the circulatory and respiratory systems: Secondary | ICD-10-CM | POA: Diagnosis not present

## 2020-05-19 DIAGNOSIS — K219 Gastro-esophageal reflux disease without esophagitis: Secondary | ICD-10-CM | POA: Diagnosis not present

## 2020-05-19 DIAGNOSIS — F419 Anxiety disorder, unspecified: Secondary | ICD-10-CM | POA: Diagnosis not present

## 2020-05-19 DIAGNOSIS — G620 Drug-induced polyneuropathy: Secondary | ICD-10-CM

## 2020-05-19 MED ORDER — PANTOPRAZOLE SODIUM 20 MG PO TBEC: DELAYED_RELEASE_TABLET | ORAL | 3 refills | Status: DC

## 2020-05-19 NOTE — Progress Notes (Signed)
HPI:  [name] is a 79 y.o. female, who is here today for 3 months follow up.   She was last seen on 02/05/20 No new problems since her last visit.  GERD: Currently she is on pantoprazole 20 mg before breakfast. Medication has helped with heartburn. Negative for abdominal pain, nausea, vomiting, changes in bowel habits, or melena.  Peripheral neuropathy: She try Cymbalta, gabapentin, and Lyrica.  Medications poorly tolerated. Numbness and burning in lower extremities, s/p chemotherapy. Problem has been stable.  Hypertension: BP readings:Most 120-130's/60's. She is on Amlodipine 2.5 mg daily and Losartan 100 mg daily. She is trying to modify amount of food she eats according to BP readings.She would like to eat more sometimes but afraid of BP elevation. She is following low salt diet.  Negative for severe/frequent headache, visual changes, chest pain, dyspnea, palpitation,focal weakness, or edema.  Lab Results  Component Value Date   CREATININE 0.73 02/05/2020   BUN 13 02/05/2020   NA 138 02/05/2020   K 3.7 02/05/2020   CL 98 02/05/2020   CO2 31 02/05/2020    Anxiety: Currently she is on Lorazepam 0.5 mg twice daily as needed.  She has taken alprazolam for years and states that it is really helping with episodes of anxiety. Problem exacerbated by concerned about her health. She denies depressed mood.    Today during examination I noticed some fine rales during anterior chest auscultation. She denies fever, chills, night sweats, abnormal weight loss, cough, wheezing, shortness of breath. CXR: Negative for acute process.  Review of Systems  Constitutional: Positive for fatigue. Negative for activity change and appetite change.  HENT: Negative for mouth sores, nosebleeds, sore throat and trouble swallowing.   Gastrointestinal:       Negative for changes in bowel habits.  Genitourinary: Negative for decreased urine volume, dysuria and hematuria.   Allergic/Immunologic: Positive for environmental allergies.  Neurological: Negative for syncope, facial asymmetry and weakness.  Psychiatric/Behavioral: Negative for behavioral problems. The patient is nervous/anxious.   Rest of ROS, see pertinent positives sand negatives in HPI  Current Outpatient Medications on File Prior to Visit  Medication Sig Dispense Refill  . acetaminophen (TYLENOL) 650 MG CR tablet Take 650 mg by mouth every 8 (eight) hours as needed.    Marland Kitchen amLODipine (NORVASC) 2.5 MG tablet Take 1 tablet (2.5 mg total) by mouth at bedtime. 30 tablet 3  . Calcium Carbonate (CALCIUM 600 PO) Take 1 tablet by mouth daily.    . cetirizine (ZYRTEC) 10 MG tablet Take 0.5 tablets (5 mg total) by mouth daily. 30 tablet 1  . conjugated estrogens (PREMARIN) vaginal cream Place 1 Applicatorful vaginally 3 (three) times a week. 42.5 g 1  . famotidine (PEPCID) 20 MG tablet Take 1 tablet (20 mg total) by mouth 2 (two) times daily. 60 tablet 3  . fluticasone (FLONASE) 50 MCG/ACT nasal spray Place 1 spray into both nostrils 2 (two) times daily. 16 g 3  . loperamide (IMODIUM) 2 MG capsule Take 2 mg by mouth as needed.    Marland Kitchen LORazepam (ATIVAN) 0.5 MG tablet TAKE 1/2 TO 1 TABLET BY MOUTH TWICE DAILY AS NEEDED FOR ANXIETY 50 tablet 3  . losartan (COZAAR) 100 MG tablet TAKE 1 TABLET BY MOUTH EVERY DAY 90 tablet 1  . montelukast (SINGULAIR) 10 MG tablet Take 1 tablet (10 mg total) by mouth at bedtime. 30 tablet 3  . Red Yeast Rice 600 MG CAPS Take 600 mg by mouth 2 (  two) times daily.    Marland Kitchen Zoster Vaccine Adjuvanted [facility]) injection 0.5 ml in muscle and repeat in 8 weeks 0.5 mL 1   No current facility-administered medications on file prior to visit.   Past Medical History:  Diagnosis Date  . Anxiety   . Cancer ([place])    rectal ca  . Thyroid disease    Allergies  Allergen Reactions  . Latex Rash  . Macrodantin Shortness Of Breath  . Guaifenesin Er Itching  . Morphine And Related Nausea Only  .  Ditropan [Oxybutynin]     Causes diarrhea - contains Magnesium  . Lipitor [Atorvastatin]     rash  . Lisinopril     Diarrhea/cough  . Magnesium-Containing Compounds     Causes diarrhea  . Pravastatin     Leg cramps  . Zoloft [Sertraline Hcl]     Diarrhea    Social History   Socioeconomic History  . Marital status: Married    Spouse name: Not on file  . Number of children: Not on file  . Years of education: Not on file  . Highest education level: Not on file  Occupational History  . Not on file  Tobacco Use  . Smoking status: Former Smoker    Types: Cigarettes  . Smokeless tobacco: Never Used  Substance and Sexual Activity  . Alcohol use: No  . Drug use: No  . Sexual activity: Not on file  Other Topics Concern  . Not on file  Social History Narrative  . Not on file   Social Determinants of Health   Financial Resource Strain:   . Difficulty of Paying Living Expenses:   Food Insecurity:   . Worried About Charity fundraiser in the Last Year:   . Arboriculturist in the Last Year:   Transportation Needs:   . Film/video editor (Medical):   Marland Kitchen Lack of Transportation (Non-Medical):   Physical Activity:   . Days of Exercise per Week:   . Minutes of Exercise per Session:   Stress:   . Feeling of Stress :   Social Connections:   . Frequency of Communication with Friends and Family:   . Frequency of Social Gatherings with Friends and Family:   . Attends Religious Services:   . Active Member of Clubs or Organizations:   . Attends Archivist Meetings:   Marland Kitchen Marital Status:    Vitals:   05/19/20 1121  BP: 140/70  Pulse: 71  Resp: 16  Temp: 97.9 F (36.6 C)  SpO2: 97%   Wt Readings from Last 3 Encounters:  05/19/20 131 lb 6 oz (59.6 kg)  02/05/20 135 lb (61.2 kg)  07/29/19 137 lb (62.1 kg)    Body mass index is 19.98 kg/m.  Physical Exam  Nursing note and vitals reviewed. Constitutional: She is oriented to person, place, and time. She  appears well-developed. No distress.  HENT:  Head: Normocephalic and atraumatic.  Mouth/Throat: Oropharynx is clear and moist and mucous membranes are normal.  Eyes: Conjunctivae are normal.  Cardiovascular: Normal rate and regular rhythm.  Murmur (? soft SEM RUSB) heard. Pulses:      Dorsalis pedis pulses are 2+ on the right side and 2+ on the left side.  Respiratory: Effort normal. No respiratory distress. She has rales (Fine rales heard on ant chest auscultation).  GI: Soft. She exhibits no mass. There is no hepatomegaly. There is no abdominal tenderness.  Musculoskeletal:        General:  No edema.  Lymphadenopathy:    She has no cervical adenopathy.  Neurological: She is alert and oriented to person, place, and time. She has normal strength. No cranial nerve deficit. Gait normal.  Skin: Skin is warm. No rash noted. No erythema.  Psychiatric: She has a normal mood and affect.  Well groomed, good eye contact.   ASSESSMENT AND PLAN:  Ms. Giannamarie Ciprian Lister was seen today for chronic disease management.   Orders Placed This Encounter  Procedures  . 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 She has not tolerated several medications. Adequate foot/leg care to continue.  GERD (gastroesophageal reflux disease) Problem has been well controlled with pantoprazole 20 mg daily. We discussed side effects. No changes in current medication. Continue GERD precautions.  Anxiety disorder Problem is adequately controlled. No changes in current management. We have discussed some side effects of lorazepam.  Essential hypertension BP adequately controlled. Continue losartan 100 mg daily and amlodipine 2.5 mg daily. Continue monitoring BP 2-3 times per week instead daily Low-salt diet also recommended.  Chest rales Asymptomatic. + Former smoker. Noted some wt loss, most likely due to dietary changes. We discussed possible etiologies.    Return in about 4 months (around 09/19/2020) for HTN,anxiety.   Betty G. Martinique, MD  [facility]. [place] office.  A few things to remember from today's visit:   Continue pantoprazole. No changes to your medications. You can continue checking your blood pressure 2-3 times per week.  Today X ray was ordered.  This can be done at [facility] between 8 am and 5 pm: [place]. [phone].    If you need refills please call your pharmacy. Do not use My Chart to request refills or for acute issues that need immediate attention.    Please be sure medication list is accurate. If a new problem present, please set up appointment sooner than planned today.

## 2020-05-19 NOTE — Assessment & Plan Note (Signed)
BP adequately controlled. Continue losartan 100 mg daily and amlodipine 2.5 mg daily. Continue monitoring BP 2-3 times per week instead daily Low-salt diet also recommended.

## 2020-05-19 NOTE — Patient Instructions (Signed)
A few things to remember from today's visit:   Continue pantoprazole. No changes to your medications. You can continue checking your blood pressure 2-3 times per week.  Today X ray was ordered.  This can be done at [facility] between 8 am and 5 pm: [place]. [phone].    If you need refills please call your pharmacy. Do not use My Chart to request refills or for acute issues that need immediate attention.    Please be sure medication list is accurate. If a new problem present, please set up appointment sooner than planned today.

## 2020-05-19 NOTE — Assessment & Plan Note (Signed)
Problem has been stable. She has not tolerated several medications. Adequate foot/leg care to continue.

## 2020-05-19 NOTE — Assessment & Plan Note (Signed)
Problem is adequately controlled. No changes in current management. We have discussed some side effects of lorazepam.

## 2020-05-19 NOTE — Assessment & Plan Note (Signed)
Problem has been well controlled with pantoprazole 20 mg daily. We discussed side effects. No changes in current medication. Continue GERD precautions.

## 2020-05-21 ENCOUNTER — Ambulatory Visit (INDEPENDENT_AMBULATORY_CARE_PROVIDER_SITE_OTHER)
Admission: RE | Admit: 2020-05-21 | Discharge: 2020-05-21 | Disposition: A | Discharge status: Home / Self Care | Payer: Medicare Other | Source: Ambulatory Visit | Attending: Family Medicine | Admitting: Family Medicine

## 2020-05-21 ENCOUNTER — Other Ambulatory Visit: Payer: Self-pay

## 2020-05-21 DIAGNOSIS — R0989 Other specified symptoms and signs involving the circulatory and respiratory systems: Secondary | ICD-10-CM

## 2020-05-21 DIAGNOSIS — J449 Chronic obstructive pulmonary disease, unspecified: Secondary | ICD-10-CM | POA: Diagnosis not present

## 2020-05-28 ENCOUNTER — Telehealth: Payer: Self-pay | Admitting: Family Medicine

## 2020-05-28 NOTE — Telephone Encounter (Signed)
Pt called back to get her xray results

## 2020-06-01 NOTE — Telephone Encounter (Signed)
Patient given results and verbalized understanding.

## 2020-08-02 ENCOUNTER — Other Ambulatory Visit: Payer: Self-pay | Admitting: Family Medicine

## 2020-08-02 DIAGNOSIS — F419 Anxiety disorder, unspecified: Secondary | ICD-10-CM

## 2020-08-11 ENCOUNTER — Ambulatory Visit: Payer: Medicare Other

## 2020-08-11 ENCOUNTER — Ambulatory Visit: Payer: Medicare Other | Admitting: Family Medicine

## 2020-08-12 ENCOUNTER — Telehealth: Payer: Self-pay

## 2020-08-12 ENCOUNTER — Ambulatory Visit: Payer: Medicare Other

## 2020-08-12 ENCOUNTER — Other Ambulatory Visit: Payer: Self-pay

## 2020-09-09 ENCOUNTER — Encounter: Payer: Self-pay | Admitting: Family Medicine

## 2020-09-09 ENCOUNTER — Other Ambulatory Visit: Payer: Self-pay

## 2020-09-09 ENCOUNTER — Ambulatory Visit (INDEPENDENT_AMBULATORY_CARE_PROVIDER_SITE_OTHER): Payer: Medicare Other | Admitting: Family Medicine

## 2020-09-09 VITALS — BP 120/70 | HR 83 | Resp 16 | Ht 68.0 in | Wt 133.0 lb

## 2020-09-09 DIAGNOSIS — Z23 Encounter for immunization: Secondary | ICD-10-CM

## 2020-09-09 DIAGNOSIS — J3089 Other allergic rhinitis: Secondary | ICD-10-CM

## 2020-09-09 DIAGNOSIS — I10 Essential (primary) hypertension: Secondary | ICD-10-CM

## 2020-09-09 DIAGNOSIS — J449 Chronic obstructive pulmonary disease, unspecified: Secondary | ICD-10-CM | POA: Insufficient documentation

## 2020-09-09 DIAGNOSIS — Z0000 Encounter for general adult medical examination without abnormal findings: Secondary | ICD-10-CM | POA: Diagnosis not present

## 2020-09-09 DIAGNOSIS — F419 Anxiety disorder, unspecified: Secondary | ICD-10-CM

## 2020-09-09 DIAGNOSIS — G620 Drug-induced polyneuropathy: Secondary | ICD-10-CM | POA: Diagnosis not present

## 2020-09-09 DIAGNOSIS — T451X5A Adverse effect of antineoplastic and immunosuppressive drugs, initial encounter: Secondary | ICD-10-CM

## 2020-09-09 DIAGNOSIS — K219 Gastro-esophageal reflux disease without esophagitis: Secondary | ICD-10-CM

## 2020-09-09 MED ORDER — CETIRIZINE HCL 10 MG PO TABS: 5.0000 mg | ORAL_TABLET | Freq: Every day | ORAL | 1 refills | Status: DC

## 2020-09-09 MED ORDER — IPRATROPIUM BROMIDE 0.06 % NA SOLN: 2.0000 | Freq: Four times a day (QID) | NASAL | 6 refills | Status: DC

## 2020-09-09 NOTE — Progress Notes (Signed)
HPI: [name] is a 79 y.o. female, who is here today with her daughter in law for chronic disease management. She is upset because could not do her AWV, she states that she did not receive call but apparently she did not have her phone on and missed calls x 2. She would like to have it done today.  Chronic medical problems: Anxiety,insomina,peripheral neuropathy,hypothyroidism,HLD,skin cancer, OA,back pain, and allergies among some. She was last seen on 05/19/20.  -HTN:She is checking BP regularly. Stopped Amlodipine 2.5 mg because some BP were "low." She is on Losartan 100 mg daily.  Negative for severe/frequent headache, visual changes, chest pain, dyspnea, palpitation,or edema.  Lab Results  Component Value Date   CREATININE 0.73 02/05/2020   BUN 13 02/05/2020   NA 138 02/05/2020   K 3.7 02/05/2020   CL 98 02/05/2020   CO2 31 02/05/2020   Hypercholesterinemia on Red yeast rice, did not tolerate statin/cholesterol medications. She follows a healthful diet. Does not exercise regularly but she is active with chores at home.  -Morning with clear rhinorrhea, dripping. She has not identified exacerbating or alleviating factors. It is not worse when eating. Negative for sore throat, or nasal congestion. She is not using Flonase nasal spray,irritates her nose. She is on Cetirizine 10 mg daily.  -GERD: She is on Protonix 20 mg daily. Medication is helping with heartburn and cough resolved after starting PPI. She has been on Omeprazole and Zantac.  LE peripheral neuropathy chemo induced. Treated for rectal cancer about 20-21 years ago. Numbness and burning sensation stable.  She tried a few medications but did nit tolerated well: Tramadol,duloxetin,gabapentin,and lyrica.  She also wants to discussed finding on her last CXR, COPD (05/22/20). In the past she has had intermittent episodes of cough and SOB, usually symptoms are exacerbated by viral respiratory  illness. Former smoker. Currently asymptomatic.  She lives alone. Independent ADL's and IADL's.Mildly unstable gait but she does not use assistance. No falls in the past year and denies depression symptoms. Lives close to family and talk with them daily.  Functional Status Survey: Is the patient deaf or have difficulty hearing?: Yes (Hx of hearing loss) Does the patient have difficulty seeing, even when wearing glasses/contacts?: No Does the patient have difficulty concentrating, remembering, or making decisions?: No Does the patient have difficulty walking or climbing stairs?: No Does the patient have difficulty dressing or bathing?: No Does the patient have difficulty doing errands alone such as visiting a doctor's office or shopping?: No  Still drives locally, today her daughter in law drove her here.  Fall Risk  09/09/2020 05/19/2020 09/12/2017 04/14/2016  Falls in the past year? 0 0 No -  Number falls in past yr: 0 0 - 1  Injury with Fall? 0 0 - No  Risk for fall due to : Impaired balance/gait;Medication side effect - Impaired balance/gait -  Follow up Education provided;Falls prevention discussed - - Education provided   Providers she sees regularly: Eye care provider: Dr Meyer Cory. Dermatologist as needed. She is not longer following with oncologist.  Depression screen [facility] 2/9 09/09/2020  Decreased Interest 0  Down, Depressed, Hopeless 0  PHQ - 2 Score 0  Altered sleeping 1  Tired, decreased energy 1  Change in appetite 0  Feeling bad or failure about yourself  0  Trouble concentrating 0  Moving slowly or fidgety/restless 0  Suicidal thoughts 0  PHQ-9 Score 2  Difficult doing work/chores -   Anxiety: She is  on Lorazepam 0.5 mg bid as needed. She has taking medication for many years. Medication helps her sleep. She has tried several SSRI and SNRI but poorly tolerated.   Mini-Cog - 09/09/20 0925    Normal clock drawing test? yes    How many words correct? 3             Hearing Screening   _0  _1  _2  _3  _4  _5  _6  _7  _8   Right ear:           Left ear:             Visual Acuity Screening   Right eye Left eye Both eyes  Without correction:     With correction:  20/50 20/50    Review of Systems  Constitutional: Positive for fatigue. Negative for activity change, appetite change and fever.  HENT: Negative for mouth sores, nosebleeds and trouble swallowing.   Eyes: Negative for redness, itching and visual disturbance.  Respiratory: Negative for cough and wheezing.   Gastrointestinal: Negative for abdominal pain, nausea and vomiting.       Negative for changes in bowel habits.  Genitourinary: Negative for decreased urine volume, dysuria and hematuria.  Musculoskeletal: Positive for arthralgias and back pain.  Skin: Negative for rash and wound.  Allergic/Immunologic: Positive for environmental allergies.  Neurological: Negative for syncope, facial asymmetry and weakness.  Psychiatric/Behavioral: Negative for confusion. The patient is nervous/anxious.   Rest of ROS, see pertinent positives sand negatives in HPI  Current Outpatient Medications on File Prior to Visit  Medication Sig Dispense Refill  . acetaminophen (TYLENOL) 650 MG CR tablet Take 650 mg by mouth every 8 (eight) hours as needed.    . Calcium Carbonate (CALCIUM 600 PO) Take 1 tablet by mouth daily.    . fluticasone (FLONASE) 50 MCG/ACT nasal spray Place 1 spray into both nostrils 2 (two) times daily. 16 g 3  . loperamide (IMODIUM) 2 MG capsule Take 2 mg by mouth as needed.    Marland Kitchen LORazepam (ATIVAN) 0.5 MG tablet TAKE 1/2 TO 1 TABLET BY MOUTH TWICE DAILY AS NEEDED FOR ANXIETY 50 tablet 3  . losartan (COZAAR) 100 MG tablet TAKE 1 TABLET BY MOUTH EVERY DAY 90 tablet 1  . pantoprazole (PROTONIX) 20 MG tablet 1 tab 30 min before breakfast. Stop omeprazole. 90 tablet 3  . Red Yeast Rice 600 MG CAPS Take 600 mg by mouth 2 (two) times daily.     No current  facility-administered medications on file prior to visit.     Past Medical History:  Diagnosis Date  . Anxiety   . Cancer ([place])    rectal ca  . Thyroid disease    Allergies  Allergen Reactions  . Latex Rash  . Macrodantin Shortness Of Breath  . Guaifenesin Er Itching  . Morphine And Related Nausea Only  . Ditropan [Oxybutynin]     Causes diarrhea - contains Magnesium  . Lipitor [Atorvastatin]     rash  . Lisinopril     Diarrhea/cough  . Magnesium-Containing Compounds     Causes diarrhea  . Pravastatin     Leg cramps  . Zoloft [Sertraline Hcl]     Diarrhea    Social History   Socioeconomic History  . Marital status: Married    Spouse name: Not on file  . Number of children: Not on file  . Years of education: Not on file  . Highest education level: Not on file  Occupational History  . Not on file  Tobacco Use  . Smoking status: Former Smoker    Types: Cigarettes  . Smokeless tobacco: Never Used  Substance and Sexual Activity  . Alcohol use: No  . Drug use: No  . Sexual activity: Not on file  Other Topics Concern  . Not on file  Social History Narrative  . Not on file   Social Determinants of Health   Financial Resource Strain:   . Difficulty of Paying Living Expenses: Not on file  Food Insecurity:   . Worried About Charity fundraiser in the Last Year: Not on file  . Ran Out of Food in the Last Year: Not on file  Transportation Needs:   . Lack of Transportation (Medical): Not on file  . Lack of Transportation (Non-Medical): Not on file  Physical Activity:   . Days of Exercise per Week: Not on file  . Minutes of Exercise per Session: Not on file  Stress:   . Feeling of Stress : Not on file  Social Connections:   . Frequency of Communication with Friends and Family: Not on file  . Frequency of Social Gatherings with Friends and Family: Not on file  . Attends Religious Services: Not on file  . Active Member of Clubs or Organizations: Not on file  .  Attends Archivist Meetings: Not on file  . Marital Status: Not on file   Vitals:   09/09/20 1055  BP: 120/70  Pulse: 83  Resp: 16  SpO2: 90%   Body mass index is 20.22 kg/m.  Physical Exam Vitals and nursing note reviewed.  Constitutional:      General: She is not in acute distress.    Appearance: She is well-developed.  HENT:     Head: Normocephalic and atraumatic.     Nose: No nasal tenderness, mucosal edema or rhinorrhea.     Right Turbinates: Not enlarged.     Left Turbinates: Not enlarged.     Right Sinus: No maxillary sinus tenderness or frontal sinus tenderness.     Left Sinus: No maxillary sinus tenderness or frontal sinus tenderness.     Mouth/Throat:     Mouth: Mucous membranes are moist.     Dentition: Has dentures.     Pharynx: Oropharynx is clear.  Eyes:     Conjunctiva/sclera: Conjunctivae normal.  Cardiovascular:     Rate and Rhythm: Normal rate and regular rhythm.     Heart sounds: No murmur heard.   Pulmonary:     Effort: Pulmonary effort is normal. No respiratory distress.     Breath sounds: Normal breath sounds.  Abdominal:     Palpations: Abdomen is soft. There is no hepatomegaly or mass.     Tenderness: There is no abdominal tenderness.  Lymphadenopathy:     Cervical: No cervical adenopathy.  Skin:    General: Skin is warm.     Findings: No erythema or rash.  Neurological:     Mental Status: She is alert and oriented to person, place, and time.     Cranial Nerves: No cranial nerve deficit.     Gait: Gait normal.  Psychiatric:        Mood and Affect: Affect normal. Mood is anxious.     Comments: Well groomed, good eye contact.   ASSESSMENT AND PLAN:  Ms. Melanie [name] was seen today for AWV and chronic disease management.  Orders Placed This Encounter  Procedures  . Flu Vaccine QUAD High Dose(Fluad)  . BASIC METABOLIC PANEL WITH GFR  Lab Results  Component Value Date   CREATININE 0.87 09/09/2020   BUN 18 09/09/2020     NA 136 09/09/2020   K 4.0 09/09/2020   CL 99 09/09/2020   CO2 32 09/09/2020    Need for influenza vaccination -     Flu Vaccine QUAD High Dose(Fluad)  Non-seasonal allergic rhinitis due to other allergic trigger Continue Cetirizine 10 mg daily, some side effects discussed. She agrees with trying Atroven nasal spray. Nasal saline irrigations as needed may also help.  -     cetirizine (ZYRTEC) 10 MG tablet; Take 0.5 tablets (5 mg total) by mouth daily. -     ipratropium (ATROVENT) 0.06 % nasal spray; Place 2 sprays into both nostrils 4 (four) times daily.  Medicare annual wellness visit, subsequent We discussed the importance of staying active, physically (as tolerated) and mentally, as well as the benefits of a healthy/balance diet. Low impact exercise that involve stretching and strengthing are ideal. Vaccines up to date. We discussed preventive screening for the next 5-10 years, summery of recommendations given in AVS.  Advance directives and end of life discussed, she has POA and living will.   Essential hypertension BP adequately controlled. Continue Losartan 100 mg daily. Continue low salt diet and monitoring BP regularly.  Chronic obstructive pulmonary disease, unspecified COPD type ([place]) Asymptomatic. We discussed Dx,prognosis,and treatment if symptoms occurred.  Peripheral neuropathy due to chemotherapy [facility]) It is not as severe now. Continue good skin care. Fall precautions.  Gastroesophageal reflux disease without esophagitis Problem is well controlled. Continue Protonix 20 mg daily and GERD precautions.  Anxiety disorder, unspecified type Stable. No changes in current management. She understands possible side effects of benzos at her age.   Return in about 6 months (around 03/09/2021).   Abed Schar G. Martinique, MD  [facility]. [place] office.   Ms. Staub , Thank you for taking time to come for your Medicare Wellness Visit. I appreciate  your ongoing commitment to your health goals. Please review the following plan we discussed and let me know if I can assist you in the future.   These are the goals we discussed: Goals   None     This is a list of the screening recommended for you and due dates:  Health Maintenance  Topic Date Due  . COVID-19 Vaccine (1) Never done  . DEXA scan (bone density measurement)  Never done  . Tetanus Vaccine  09/13/2027  . Flu Shot  Completed  . Pneumonia vaccines  Discontinued   A few things to remember from today's visit:   Need for influenza vaccination - Plan: Flu Vaccine QUAD High Dose(Fluad)  Non-seasonal allergic rhinitis due to other allergic trigger - Plan: cetirizine (ZYRTEC) 10 MG tablet  Medicare annual wellness visit, subsequent  Essential hypertension - Plan: BASIC METABOLIC PANEL WITH GFR  If you need refills please call your pharmacy. Do not use My Chart to request refills or for acute issues that need immediate attention.    Please be sure medication list is accurate. If a new problem present, please set up appointment sooner than planned today.  A few tips:  -As we age balance is not as good as it was, so there is a higher risks for falls. Please remove small rugs and furniture that is "in your way" and could increase the risk of falls. Stretching exercises may help with fall prevention: Yoga and Tai Chi are some examples. Low impact exercise is better, so you are not  very achy the next day.  -Sun screen and avoidance of direct sun light recommended. Caution with dehydration, if working outdoors be sure to drink enough fluids.  - Some medications are not safe as we age, increases the risk of side effects and can potentially interact with other medication you are also taken;  including some of over the counter medications. Be sure to let me know when you start a new medication even if it is a dietary/vitamin supplement.   -Healthy diet low in red meet/animal fat  and sugar + regular physical activity is recommended.

## 2020-09-09 NOTE — Patient Instructions (Signed)
  Melanie [name] , Thank you for taking time to come for your Medicare Wellness Visit. I appreciate your ongoing commitment to your health goals. Please review the following plan we discussed and let me know if I can assist you in the future.   These are the goals we discussed: Goals   None     This is a list of the screening recommended for you and due dates:  Health Maintenance  Topic Date Due  . COVID-19 Vaccine (1) Never done  . DEXA scan (bone density measurement)  Never done  . Tetanus Vaccine  09/13/2027  . Flu Shot  Completed  . Pneumonia vaccines  Discontinued   A few things to remember from today's visit:   Need for influenza vaccination - Plan: Flu Vaccine QUAD High Dose(Fluad)  Non-seasonal allergic rhinitis due to other allergic trigger - Plan: cetirizine (ZYRTEC) 10 MG tablet  Medicare annual wellness visit, subsequent  Essential hypertension - Plan: BASIC METABOLIC PANEL WITH GFR  If you need refills please call your pharmacy. Do not use My Chart to request refills or for acute issues that need immediate attention.    Please be sure medication list is accurate. If a new problem present, please set up appointment sooner than planned today.  A few tips:  -As we age balance is not as good as it was, so there is a higher risks for falls. Please remove small rugs and furniture that is "in your way" and could increase the risk of falls. Stretching exercises may help with fall prevention: Yoga and Tai Chi are some examples. Low impact exercise is better, so you are not very achy the next day.  -Sun screen and avoidance of direct sun light recommended. Caution with dehydration, if working outdoors be sure to drink enough fluids.  - Some medications are not safe as we age, increases the risk of side effects and can potentially interact with other medication you are also taken;  including some of over the counter medications. Be sure to let me know when you start a new  medication even if it is a dietary/vitamin supplement.   -Healthy diet low in red meet/animal fat and sugar + regular physical activity is recommended.

## 2020-09-10 LAB — BASIC METABOLIC PANEL WITH GFR
BUN: 18 mg/dL (ref 7–25)
CO2: 32 mmol/L (ref 20–32)
Calcium: 9.9 mg/dL (ref 8.6–10.4)
Chloride: 99 mmol/L (ref 98–110)
Creat: 0.87 mg/dL (ref 0.60–0.88)
GFR, Est African American: 72 mL/min/{1.73_m2} (ref >=60)
GFR, Est Non African American: 62 mL/min/{1.73_m2} (ref >=60)
Glucose, Bld: 88 mg/dL (ref 65–99)
Potassium: 4 mmol/L (ref 3.5–5.3)
Sodium: 136 mmol/L (ref 135–146)

## 2020-09-15 ENCOUNTER — Telehealth: Payer: Self-pay | Admitting: Family Medicine

## 2020-09-15 NOTE — Progress Notes (Signed)
°  Chronic Care Management   Outreach Note  09/15/2020 Name: Melanie [name] MRN: [id number] DOB: 1941-03-15  Referred by: Martinique, Betty G, MD Reason for referral : No chief complaint on file.   An unsuccessful telephone outreach was attempted today. The patient was referred to the pharmacist for assistance with care management and care coordination.   Follow Up Plan:   Carley Perdue [employer] Scheduler

## 2020-09-16 ENCOUNTER — Telehealth: Payer: Self-pay | Admitting: Family Medicine

## 2020-09-16 NOTE — Progress Notes (Signed)
  Chronic Care Management   Outreach Note  09/16/2020 Name: Melanie [name] MRN: [id number] DOB: 01/09/1941  Referred by: Martinique, Betty G, MD Reason for referral : No chief complaint on file.   A second unsuccessful telephone outreach was attempted today. The patient was referred to pharmacist for assistance with care management and care coordination.  Follow Up Plan:   Carley Perdue [employer] Scheduler

## 2020-09-16 NOTE — Progress Notes (Signed)
  Chronic Care Management   Note  09/16/2020 Name: Melanie [name] MRN: [id number] DOB: [birth year]  Melanie [name] is a 79 y.o. year old female who is a primary care patient of Martinique, Malka So, MD. I reached out to [place] by phone today in response to a referral sent by Melanie [name]'s PCP, Martinique, Betty G, MD.   Melanie [name] was given information about Chronic Care Management services today including:  1. CCM service includes personalized support from designated clinical staff supervised by her physician, including individualized plan of care and coordination with other care providers 2. 24/7 contact phone numbers for assistance for urgent and routine care needs. 3. Service will only be billed when office clinical staff spend 20 minutes or more in a month to coordinate care. 4. Only one practitioner may furnish and bill the service in a calendar month. 5. The patient may stop CCM services at any time (effective at the end of the month) by phone call to the office staff.   Patient agreed to services and verbal consent obtained.   Follow up plan:   Carley Perdue [employer] Scheduler

## 2020-09-26 ENCOUNTER — Other Ambulatory Visit: Payer: Self-pay | Admitting: Family Medicine

## 2020-09-26 DIAGNOSIS — I10 Essential (primary) hypertension: Secondary | ICD-10-CM

## 2020-11-09 ENCOUNTER — Telehealth: Payer: Self-pay | Admitting: Family Medicine

## 2020-11-09 NOTE — Telephone Encounter (Signed)
error 

## 2020-11-16 ENCOUNTER — Ambulatory Visit: Payer: Medicare Other

## 2020-11-30 DIAGNOSIS — L905 Scar conditions and fibrosis of skin: Secondary | ICD-10-CM | POA: Diagnosis not present

## 2020-11-30 DIAGNOSIS — C44311 Basal cell carcinoma of skin of nose: Secondary | ICD-10-CM | POA: Diagnosis not present

## 2020-11-30 DIAGNOSIS — L821 Other seborrheic keratosis: Secondary | ICD-10-CM | POA: Diagnosis not present

## 2020-11-30 DIAGNOSIS — D225 Melanocytic nevi of trunk: Secondary | ICD-10-CM | POA: Diagnosis not present

## 2020-11-30 DIAGNOSIS — C44619 Basal cell carcinoma of skin of left upper limb, including shoulder: Secondary | ICD-10-CM | POA: Diagnosis not present

## 2020-11-30 DIAGNOSIS — C44612 Basal cell carcinoma of skin of right upper limb, including shoulder: Secondary | ICD-10-CM | POA: Diagnosis not present

## 2020-11-30 DIAGNOSIS — D1801 Hemangioma of skin and subcutaneous tissue: Secondary | ICD-10-CM | POA: Diagnosis not present

## 2020-12-03 ENCOUNTER — Other Ambulatory Visit: Payer: Self-pay | Admitting: Family Medicine

## 2020-12-03 DIAGNOSIS — F419 Anxiety disorder, unspecified: Secondary | ICD-10-CM

## 2020-12-03 NOTE — Telephone Encounter (Signed)
Last OV 09/09/20 Last refill 08/04/20 #50/3 Next OV not scheduled

## 2020-12-04 ENCOUNTER — Telehealth: Payer: Self-pay | Admitting: Family Medicine

## 2020-12-04 NOTE — Telephone Encounter (Signed)
Patient is calling and stated that for the last couple of weeks that her blood pressure has been elevated to 160/70  With a pulse of 70 and goes down once she takes her anxiety medication. Pt wanted to know does her medication have anything to do with her blood pressure, please advise. CB is [phone]

## 2020-12-07 NOTE — Telephone Encounter (Signed)
Please set up a follow up appt in office with pcp this week.

## 2020-12-07 NOTE — Telephone Encounter (Signed)
Left voice mail for patient to call back to schedule appointment for medication f/u.

## 2021-01-19 DIAGNOSIS — C44311 Basal cell carcinoma of skin of nose: Secondary | ICD-10-CM | POA: Diagnosis not present

## 2021-01-28 ENCOUNTER — Ambulatory Visit: Payer: Medicare Other

## 2021-02-23 ENCOUNTER — Telehealth: Payer: Self-pay | Admitting: Pharmacist

## 2021-02-23 NOTE — Chronic Care Management (AMB) (Signed)
    Chronic Care Management Pharmacy Assistant   Name: Melanie [name]  MRN: [id number] DOB: 16-Jan-1941  Reason for Encounter: : Initial Questions for Pharmacist visit on 02-24-2021  Patient Questions: 1. Have you seen any other providers since your last visit?  . Dermatology 2. Any changes in your medications or health? No 3. Any side effects from any medications? . Pantoprazole (she would like to discuss)  4. Do you have any symptoms or problems not managed by your medications? No 5. Any concerns about your health right now? No 6. Has your provider asked that you check blood pressure, blood sugar, or follow a special diet at home?  Marland Kitchen She takes her BP daily 7.  Do you get any type of exercise regularly? No 8. Can you think of a goal you would like to reach for your health? No 9. Do you have any problems getting your medications? No 10. Is there anything that you would like to discuss during the appointment? No  The patient was asked to please bring medications, blood pressure/ blood sugar log, and supplements to her appointment.  PCP : Martinique, Betty G, MD  Allergies:   Allergies  Allergen Reactions  . Latex Rash  . Macrodantin Shortness Of Breath  . Guaifenesin Er Itching  . Morphine And Related Nausea Only  . Ditropan [Oxybutynin]     Causes diarrhea - contains Magnesium  . Lipitor [Atorvastatin]     rash  . Lisinopril     Diarrhea/cough  . Magnesium-Containing Compounds     Causes diarrhea  . Pravastatin     Leg cramps  . Zoloft [Sertraline Hcl]     Diarrhea    Medications: Outpatient Encounter Medications as of 02/23/2021  Medication Sig  . acetaminophen (TYLENOL) 650 MG CR tablet Take 650 mg by mouth every 8 (eight) hours as needed.  . Calcium Carbonate (CALCIUM 600 PO) Take 1 tablet by mouth daily.  . cetirizine (ZYRTEC) 10 MG tablet Take 0.5 tablets (5 mg total) by mouth daily.  . fluticasone (FLONASE) 50 MCG/ACT nasal spray Place 1 spray into both nostrils  2 (two) times daily.  Marland Kitchen ipratropium (ATROVENT) 0.06 % nasal spray Place 2 sprays into both nostrils 4 (four) times daily.  Marland Kitchen loperamide (IMODIUM) 2 MG capsule Take 2 mg by mouth as needed.  Marland Kitchen LORazepam (ATIVAN) 0.5 MG tablet TAKE 1/2 TO 1 TABLET BY MOUTH TWICE DAILY AS NEEDED FOR ANXIETY  . losartan (COZAAR) 100 MG tablet TAKE 1 TABLET BY MOUTH EVERY DAY  . pantoprazole (PROTONIX) 20 MG tablet 1 tab 30 min before breakfast. Stop omeprazole.  . Red Yeast Rice 600 MG CAPS Take 600 mg by mouth 2 (two) times daily.   No facility-administered encounter medications on file as of 02/23/2021.    Current Diagnosis: Patient Active Problem List   Diagnosis Date Noted  . COPD (chronic obstructive pulmonary disease) ([place]) 09/09/2020  . Adjustment disorder, unspecified 01/11/2019  . Allergic rhinitis 06/18/2018  . Chronic fatigue 01/16/2018  . Peripheral neuropathy due to chemotherapy ([place]) 08/03/2016  . Anxiety disorder 08/03/2016  . Lumbar back pain with radiculopathy affecting right lower extremity 08/03/2016  . Essential hypertension 04/14/2016  . Hypercholesteremia 04/14/2016  . Hypothyroidism 04/14/2016  . Overactive bladder 04/14/2016  . GERD (gastroesophageal reflux disease) 04/14/2016    Goals Addressed   None     Follow-Up:  Pharmacist Review  Maia Breslow, [place] Assistant [phone]

## 2021-02-24 ENCOUNTER — Ambulatory Visit (INDEPENDENT_AMBULATORY_CARE_PROVIDER_SITE_OTHER): Payer: Medicare Other | Admitting: Pharmacist

## 2021-02-24 DIAGNOSIS — I10 Essential (primary) hypertension: Secondary | ICD-10-CM

## 2021-02-24 DIAGNOSIS — J449 Chronic obstructive pulmonary disease, unspecified: Secondary | ICD-10-CM

## 2021-02-24 DIAGNOSIS — K219 Gastro-esophageal reflux disease without esophagitis: Secondary | ICD-10-CM

## 2021-02-24 DIAGNOSIS — F419 Anxiety disorder, unspecified: Secondary | ICD-10-CM

## 2021-02-24 NOTE — Progress Notes (Signed)
Chronic Care Management Pharmacy Note  03/01/2021 Name:  DEEANNE [name] MRN:  [id number] DOB:  12-Feb-1941  Subjective: Melanie [name] is an 80 y.o. year old female who is a primary patient of Martinique, Malka So, MD.  The CCM team was consulted for assistance with disease management and care coordination needs.    Engaged with patient by telephone for initial visit in response to provider referral for pharmacy case management and/or care coordination services.   Consent to Services:  The patient was given the following information about Chronic Care Management services today, agreed to services, and gave verbal consent: 1. CCM service includes personalized support from designated clinical staff supervised by the primary care provider, including individualized plan of care and coordination with other care providers 2. 24/7 contact phone numbers for assistance for urgent and routine care needs. 3. Service will only be billed when office clinical staff spend 20 minutes or more in a month to coordinate care. 4. Only one practitioner may furnish and bill the service in a calendar month. 5.The patient may stop CCM services at any time (effective at the end of the month) by phone call to the office staff. 6. The patient will be responsible for cost sharing (co-pay) of up to 20% of the service fee (after annual deductible is met). Patient agreed to services and consent obtained.  Patient Care Team: Martinique, Betty G, MD as PCP - General (Family Medicine) Viona Gilmore, [facility] as Pharmacist (Pharmacist)  Recent office visits: 09/09/20 Betty Martinique, MD: Patient presented for medicare annual wellness visit. Prescribed ipratropium bromide nasal spray.   Recent consult visits: [facility] visits: None in previous 6 months  Objective:  Lab Results  Component Value Date   CREATININE 0.87 09/09/2020   BUN 18 09/09/2020   GFR 76.61 02/05/2020   GFRNONAA 62 09/09/2020   GFRAA 72 09/09/2020   NA  136 09/09/2020   K 4.0 09/09/2020   CALCIUM 9.9 09/09/2020   CO2 32 09/09/2020    Lab Results  Component Value Date/Time   GFR 76.61 02/05/2020 11:55 AM   GFR 65.26 06/07/2019 11:12 AM    Last diabetic Eye exam: No results found for: HMDIABEYEEXA  Last diabetic Foot exam: No results found for: HMDIABFOOTEX   No results found for: CHOL, HDL, LDLCALC, LDLDIRECT, TRIG, CHOLHDL  Hepatic Function Latest Ref Rng & Units 04/14/2016 03/10/2011 03/16/2010  Total Protein 6.1 - 8.1 g/dL 7.5 7.2 7.4  Albumin 3.6 - 5.1 g/dL 4.3 3.8 3.4(L)  AST 10 - 35 U/L _0 ALT 6 - 29 U/L _1 Alk Phosphatase 33 - 130 U/L 63 58 89  Total Bilirubin 0.2 - 1.2 mg/dL 0.6 0.60 0.4    Lab Results  Component Value Date/Time   TSH 0.41 02/05/2020 11:55 AM   TSH 0.44 06/07/2019 11:12 AM   FREET4 1.00 04/14/2016 10:47 AM    CBC Latest Ref Rng & Units 02/05/2020 07/02/2018 02/19/2018  WBC 4.0 - 10.5 K/uL 5.6 6.4 7.3  Hemoglobin 12.0 - 15.0 g/dL 13.9 13.7 12.7  Hematocrit 36.0 - 46.0 % 41.5 40.1 37.3  Platelets 150.0 - 400.0 K/uL 273.0 277.0 373.0    No results found for: VD25OH  Clinical ASCVD: No  The ASCVD Risk score Mikey Bussing DC Jr., et al., 2013) failed to calculate for the following reasons:   The 2013 ASCVD risk score is only valid for ages 65 to 59    Depression screen [facility] 2/9 09/10/2020  09/10/2020 05/23/2020  Decreased Interest 0 0 0  Down, Depressed, Hopeless 0 0 0  PHQ - 2 Score 0 0 0  Altered sleeping 1 - 0  Tired, decreased energy 1 - 1  Change in appetite 0 - 0  Feeling bad or failure about yourself  0 - 0  Trouble concentrating 0 - 0  Moving slowly or fidgety/restless 0 - 0  Suicidal thoughts 0 - 0  PHQ-9 Score 2 - 1  Difficult doing work/chores - - Not difficult at all     No flowsheet data found.  No flowsheet data found.    Social History   Tobacco Use  Smoking Status Former Smoker  . Types: Cigarettes  Smokeless Tobacco Never Used   BP Readings from Last 3 Encounters:   09/09/20 120/70  05/19/20 140/70  02/05/20 (!) 148/80   Pulse Readings from Last 3 Encounters:  09/09/20 83  05/19/20 71  02/05/20 88   Wt Readings from Last 3 Encounters:  09/09/20 133 lb (60.3 kg)  05/19/20 131 lb 6 oz (59.6 kg)  02/05/20 135 lb (61.2 kg)    Assessment/Interventions: Review of patient past medical history, allergies, medications, health status, including review of consultants reports, laboratory and other test data, was performed as part of comprehensive evaluation and provision of chronic care management services.   SDOH:  (Social Determinants of Health) assessments and interventions performed: Yes SDOH Interventions   Flowsheet Row Most Recent Value  SDOH Interventions   Financial Strain Interventions Intervention Not Indicated  Transportation Interventions Intervention Not Indicated     Patient reports her biggest compliant is no energy and wasn't sure if there was anything she could take to help.  She has 3 daughters an 1 son. Her oldest daughter is in [place], her son lives in [place] and she sees him once a week, her middle daughter lives in [place] and she sees her most often and her youngest daughter lives near [place] but has a lot of issues with her health right now.  Patient reports that typical meals for her are potato soup, [place] sandwich with tomato and lettuce or baked chicken and lettuce and tomato. She is currently avoiding spaghetti and pizza but loves pizza. She eats take out about once a month or every 2 months with her daughter.  Patient doesn't get a lot of exercise but does walk for around 20 minutes a day inside her house. She is worried about going outside to walk in general and doesn't have anyone to walk with her. Her neighbors gave her their phone numbers if she ever needs it but is not close to them.  She usually sleeps from 11:30pm-8:30am or 7am. She denies problems with sleep but is taking lorazepam to help sleep.  seldom  naps  Patient denies any problems with her current medications, other than possibly getting winded from pantoprazole.  CCM Care Plan  Allergies  Allergen Reactions  . Latex Rash  . Macrodantin Shortness Of Breath  . Guaifenesin Er Itching  . Morphine And Related Nausea Only  . Ditropan [Oxybutynin]     Causes diarrhea - contains Magnesium  . Lipitor [Atorvastatin]     rash  . Lisinopril     Diarrhea/cough  . Magnesium-Containing Compounds     Causes diarrhea  . Pravastatin     Leg cramps  . Zoloft [Sertraline Hcl]     Diarrhea    Medications Reviewed Today    Reviewed by Viona Gilmore, [facility] (Pharmacist) on 02/24/21  at [place] List Status: <None>  Medication Order Taking? Sig Documenting Provider Last Dose Status Informant  acetaminophen (TYLENOL) 650 MG CR tablet [id number] Yes Take 650 mg by mouth every 8 (eight) hours as needed. [provider] Taking Active   Calcium Carbonate (CALCIUM 600 PO) [id number] Yes Take 1 tablet by mouth daily. [provider] Taking Active   cetirizine (ZYRTEC) 10 MG tablet [id number] Yes Take 0.5 tablets (5 mg total) by mouth daily.  Patient taking differently: Take 10 mg by mouth daily.   Martinique, Betty G, MD Taking Active   fluticasone [facility]) 50 MCG/ACT nasal spray [id number] No Place 1 spray into both nostrils 2 (two) times daily.  Patient not taking: Reported on 02/24/2021   Martinique, Betty G, MD Not Taking Active   ipratropium (ATROVENT) 0.06 % nasal spray [id number] No Place 2 sprays into both nostrils 4 (four) times daily.  Patient not taking: Reported on 02/24/2021   Martinique, Betty G, MD Not Taking Active   loperamide (IMODIUM) 2 MG capsule [id number] Yes Take 2 mg by mouth as needed. [provider] Taking Active   LORazepam (ATIVAN) 0.5 MG tablet [id number] Yes TAKE 1/2 TO 1 TABLET BY MOUTH TWICE DAILY AS NEEDED FOR ANXIETY Martinique, Betty G, MD Taking Active   losartan (COZAAR) 100 MG tablet [id number] Yes TAKE 1  TABLET BY MOUTH EVERY DAY Martinique, Betty G, MD Taking Active   pantoprazole (PROTONIX) 20 MG tablet [id number] Yes 1 tab 30 min before breakfast. Stop omeprazole. Martinique, Betty G, MD Taking Active   Red Yeast Rice 600 MG CAPS [id number] Yes Take 600 mg by mouth 2 (two) times daily. [provider] Taking Active           Patient Active Problem List   Diagnosis Date Noted  . COPD (chronic obstructive pulmonary disease) ([place]) 09/09/2020  . Adjustment disorder, unspecified 01/11/2019  . Allergic rhinitis 06/18/2018  . Chronic fatigue 01/16/2018  . Peripheral neuropathy due to chemotherapy ([place]) 08/03/2016  . Anxiety disorder 08/03/2016  . Lumbar back pain with radiculopathy affecting right lower extremity 08/03/2016  . Essential hypertension 04/14/2016  . Hypercholesteremia 04/14/2016  . Hypothyroidism 04/14/2016  . Overactive bladder 04/14/2016  . GERD (gastroesophageal reflux disease) 04/14/2016    Immunization History  Administered Date(s) Administered  . Fluad Quad(high Dose 65+) 09/09/2020  . Influenza, High Dose Seasonal PF 09/17/2014, 09/14/2016, 09/12/2017, 09/04/2018, 09/13/2019  . Influenza,inj,Quad PF,6+ Mos 09/19/2013  . Moderna SARS-COV2 Booster Vaccination 11/05/2020  . Moderna Sars-Covid-2 Vaccination 03/02/2020, 03/31/2020    Conditions to be addressed/monitored:  Hypertension, Hyperlipidemia, GERD, COPD, Anxiety and Allergic Rhinitis  Care Plan : CCM Pharmacy Care Plan  Updates made by Viona Gilmore, [place] since 03/01/2021 12:00 AM    Problem: Problem: Hypertension, Hyperlipidemia, GERD, COPD, Anxiety and Allergic Rhinitis     Long-Range Goal: Patient-Specific Goal   Start Date: 02/24/2021  Expected End Date: 02/24/2022  This Visit's Progress: On track  Priority: High  Note:   Current Barriers:  . Unable to independently monitor therapeutic efficacy  Pharmacist Clinical Goal(s):  Marland Kitchen Over the next 120 days, patient will maintain control of blood  pressure as evidenced by home blood pressure monitoring  through collaboration with PharmD and provider.   Interventions: . 1:1 collaboration with Martinique, Betty G, MD regarding development and update of comprehensive plan of care as evidenced by provider attestation and co-signature . Inter-disciplinary care team collaboration (see longitudinal plan of care) . Comprehensive medication review performed; medication list  updated in electronic medical record  Hypertension (BP goal <140/90) -Controlled -Current treatment: . Losartan 100 mg 1 tablet daily -Medications previously tried: amlodipine  -Current home readings: 145/73 (HR 67), usually 130-135 -Current dietary habits: patient limits restaurant food and limits salt intake -Current exercise habits: does not participate in structured exercise -Denies hypotensive/hypertensive symptoms -Educated on Exercise goal of 150 minutes per week; Importance of home blood pressure monitoring; -Counseled to monitor BP at home weekly, document, and provide log at future appointments -Counseled on diet and exercise extensively Recommended to continue current medication  Hyperlipidemia: (LDL goal < 100) -Not ideally controlled -Current treatment: . Red yeast rice 600 mg 1 capsule twice daily -Medications previously tried: unknown  -Current dietary patterns: patient uses olive oil and sometimes sweet butter and doesn't fry a lot of foods -Current exercise habits: does not participate in structured exercise -Educated on Cholesterol goals;  Importance of limiting foods high in cholesterol; Exercise goal of 150 minutes per week; -Counseled on diet and exercise extensively Recommended for patient to discontinue using red yeast rice due to safety concerns and lack of efficacy   Anxiety/insomnia (Goal: minimize symptoms and improve sleep) -Uncontrolled -Current treatment: . Lorazepam 0.5 mg 1/2 to 1 tablet twice daily -Medications previously  tried/failed: SSRIs (citalopram), SNRIs (venlafaxine) -PHQ9: n/a -GAD7: n/a -Educated on Benefits of medication for symptom control Benefits of cognitive-behavioral therapy with or without medication -Counseled on long term risks of taking benzodiazepines such as falls, fractures and memory loss  GERD/coughing (Goal: improve coughing) -Uncontrolled -Current treatment  . Pantoprazole 20 mg 1 tablet daily -Medications previously tried: omeprazole, Zantac (removed from market) -Counseled on tapering off of pantoprazole and trying Zantac again as it is now available  Allergic rhinitis (Goal: minimize symptoms of allergies) -Uncontrolled -Current treatment  . Cetirizine 10 mg 1 tablet daily . Ipratropium nasal spray 0.06% 2 sprays 4 times daily . Fluticasone 50 mcg/act 1 spray in both nostrils twice daily -Medications previously tried: unknown  -Recommended to continue current medication Counseled on avoiding allergy triggers such as dust and trees Patient will discuss during appointment next week about other options as nasal sprays sometimes induce vomiting    Health Maintenance -Vaccine gaps: pneumonia, shingles, tetanus  -Current therapy:  . Calcium 600 mg and vitamin D 800 units 1 tablet daily . Loperamide 2 mg capsule as needed - every 2 weeks . Acetaminophen as needed pain -Educated on Cost vs benefit of each product must be carefully weighed by individual consumer -Patient is satisfied with current therapy and denies issues -Recommended for patient to increase calcium/vitamin D supplement to 2 tablets daily  Patient Goals/Self-Care Activities . Over the next 120 days, patient will:  - take medications as prescribed check blood pressure at least weekly, document, and provide at future appointments  Follow Up Plan: Telephone follow up appointment with care management team member scheduled for: 4 months       Medication Assistance: None required.  Patient affirms current  coverage meets needs.  Patient's preferred pharmacy is:  [id number] - [place], [place] - [address] [place] [address] [place] [address] [place] [address] Phone: [phone] Fax: [phone]  [facility] #[id number] [address], [place] [address] [place] [address] Phone: [phone] Fax: [phone]  [place] [facility] [place], [place] [address] [place] [address] Phone: [phone] Fax: [phone]  Uses pill box? Yes - 2 boxes (1 for morning, and 1 for evening) Pt endorses 100% compliance - except red yeast rice  We discussed: Current pharmacy is preferred with insurance plan and patient is satisfied with pharmacy services Patient decided to: Continue current medication management strategy  Care Plan and Follow Up Patient Decision:  Patient agrees to Care Plan and Follow-up.  Plan: Telephone follow up appointment with care management team member scheduled for:  4 months  Jeni Salles, PharmD [place] Pharmacist [place] [facility] [place] [phone]

## 2021-03-01 ENCOUNTER — Telehealth: Payer: Self-pay

## 2021-03-01 DIAGNOSIS — I10 Essential (primary) hypertension: Secondary | ICD-10-CM

## 2021-03-01 DIAGNOSIS — K219 Gastro-esophageal reflux disease without esophagitis: Secondary | ICD-10-CM

## 2021-03-01 NOTE — Patient Instructions (Addendum)
Hi Melanie [name],  It was lovely to get to meet you over the phone. Below is a summary of some of the topics we addressed. I think you will feel better if you add a bit more exercise in to each day. Please continue taking your medications and checking your blood pressure at least weekly. Don't forget to see if you can get your immunization record from your previous doctor's office to make sure you are up to date on everything.   Please let me know if you need anything or have questions before our follow up!  Best, Maddie  Jeni Salles, PharmD [facility] Clinical Pharmacist [place] [facility] [place]    Visit Information  Goals Addressed            This Visit's Progress   . Manage My Medicine       Timeframe:  Long-Range Goal Priority:  High Start Date:                             Expected End Date:                       Follow Up Date 02/24/2022    - call for medicine refill 2 or 3 days before it runs out - keep a list of all the medicines I take; vitamins and herbals too - use a pillbox to sort medicine    Why is this important?   . These steps will help you keep on track with your medicines.        Patient Care Plan: CCM Pharmacy Care Plan    Problem Identified: Problem: Hypertension, Hyperlipidemia, GERD, COPD, Anxiety and Allergic Rhinitis     Long-Range Goal: Patient-Specific Goal   Start Date: 02/24/2021  Expected End Date: 02/24/2022  This Visit's Progress: On track  Priority: High  Note:   Current Barriers:  . Unable to independently monitor therapeutic efficacy  Pharmacist Clinical Goal(s):  Marland Kitchen Over the next 120 days, patient will maintain control of blood pressure as evidenced by home blood pressure monitoring  through collaboration with PharmD and provider.   Interventions: . 1:1 collaboration with [place], Betty G, MD regarding development and update of comprehensive plan of care as evidenced by provider attestation and  co-signature . Inter-disciplinary care team collaboration (see longitudinal plan of care) . Comprehensive medication review performed; medication list updated in electronic medical record  Hypertension (BP goal <140/90) -Controlled -Current treatment: . Losartan 100 mg 1 tablet daily -Medications previously tried: amlodipine  -Current home readings: 145/73 (HR 67), usually 130-135 -Current dietary habits: patient limits restaurant food and limits salt intake -Current exercise habits: does not participate in structured exercise -Denies hypotensive/hypertensive symptoms -Educated on Exercise goal of 150 minutes per week; Importance of home blood pressure monitoring; -Counseled to monitor BP at home weekly, document, and provide log at future appointments -Counseled on diet and exercise extensively Recommended to continue current medication  Hyperlipidemia: (LDL goal < 100) -Not ideally controlled -Current treatment: . Red yeast rice 600 mg 1 capsule twice daily -Medications previously tried: unknown  -Current dietary patterns: patient uses olive oil and sometimes sweet butter and doesn't fry a lot of foods -Current exercise habits: does not participate in structured exercise -Educated on Cholesterol goals;  Importance of limiting foods high in cholesterol; Exercise goal of 150 minutes per week; -Counseled on diet and exercise extensively Recommended for patient to discontinue using red yeast rice  due to safety concerns and lack of efficacy   Anxiety/insomnia (Goal: minimize symptoms and improve sleep) -Uncontrolled -Current treatment: . Lorazepam 0.5 mg 1/2 to 1 tablet twice daily -Medications previously tried/failed: SSRIs (citalopram), SNRIs (venlafaxine) -PHQ9: n/a -GAD7: n/a -Educated on Benefits of medication for symptom control Benefits of cognitive-behavioral therapy with or without medication -Counseled on long term risks of taking benzodiazepines such as falls,  fractures and memory loss  GERD/coughing (Goal: improve coughing) -Uncontrolled -Current treatment  . Pantoprazole 20 mg 1 tablet daily -Medications previously tried: omeprazole, Zantac (removed from market) -Counseled on tapering off of pantoprazole and trying Zantac again as it is now available  Allergic rhinitis (Goal: minimize symptoms of allergies) -Uncontrolled -Current treatment  . Cetirizine 10 mg 1 tablet daily . Ipratropium nasal spray 0.06% 2 sprays 4 times daily . Fluticasone 50 mcg/act 1 spray in both nostrils twice daily -Medications previously tried: unknown  -Recommended to continue current medication Counseled on avoiding allergy triggers such as dust and trees Patient will discuss during appointment next week about other options as nasal sprays sometimes induce vomiting    Health Maintenance -Vaccine gaps: pneumonia, shingles, tetanus  -Current therapy:  . Calcium 600 mg and vitamin D 800 units 1 tablet daily . Loperamide 2 mg capsule as needed - every 2 weeks . Acetaminophen as needed pain -Educated on Cost vs benefit of each product must be carefully weighed by individual consumer -Patient is satisfied with current therapy and denies issues -Recommended for patient to increase calcium/vitamin D supplement to 2 tablets daily  Patient Goals/Self-Care Activities . Over the next 120 days, patient will:  - take medications as prescribed check blood pressure at least weekly, document, and provide at future appointments  Follow Up Plan: Telephone follow up appointment with care management team member scheduled for: 4 months      Ms. Tomberlin was given information about Chronic Care Management services today including:  1. CCM service includes personalized support from designated clinical staff supervised by her physician, including individualized plan of care and coordination with other care providers 2. 24/7 contact phone numbers for assistance for urgent and  routine care needs. 3. Standard insurance, coinsurance, copays and deductibles apply for chronic care management only during months in which we provide at least 20 minutes of these services. Most insurances cover these services at 100%, however patients may be responsible for any copay, coinsurance and/or deductible if applicable. This service may help you avoid the need for more expensive face-to-face services. 4. Only one practitioner may furnish and bill the service in a calendar month. 5. The patient may stop CCM services at any time (effective at the end of the month) by phone call to the office staff.  Patient agreed to services and verbal consent obtained.   The patient verbalized understanding of instructions, educational materials, and care plan provided today and agreed to receive a mailed copy of patient instructions, educational materials, and care plan.  Telephone follow up appointment with pharmacy team member scheduled for: 4 months  Viona Gilmore, [facility]  COPD and Physical Activity Chronic obstructive pulmonary disease (COPD) is a long-term (chronic) condition that affects the lungs. COPD is a general term that can be used to describe many different lung problems that cause lung swelling (inflammation) and limit airflow, including chronic bronchitis and emphysema. The main symptom of COPD is shortness of breath, which makes it harder to do even simple tasks. This can also make it harder to exercise and be active.  Talk with your health care provider about treatments to help you breathe better and actions you can take to prevent breathing problems during physical activity. What are the benefits of exercising with COPD? Exercising regularly is an important part of a healthy lifestyle. You can still exercise and do physical activities even though you have COPD. Exercise and physical activity improve your shortness of breath by increasing blood flow (circulation). This causes your heart to  pump more oxygen through your body. Moderate exercise can improve your:  Oxygen use.  Energy level.  Shortness of breath.  Strength in your breathing muscles.  Heart health.  Sleep.  Self-esteem and feelings of self-worth.  Depression, stress, and anxiety levels. Exercise can benefit everyone with COPD. The severity of your disease may affect how hard you can exercise, especially at first, but everyone can benefit. Talk with your health care provider about how much exercise is safe for you, and which activities and exercises are safe for you.   What actions can I take to prevent breathing problems during physical activity?  Sign up for a pulmonary rehabilitation program. This type of program may include: ? Education about lung diseases. ? Exercise classes that teach you how to exercise and be more active while improving your breathing. This usually involves:  Exercise using your lower extremities, such as a stationary bicycle.  About 30 minutes of exercise, 2 to 5 times per week, for 6 to 12 weeks  Strength training, such as push ups or leg lifts. ? Nutrition education. ? Group classes in which you can talk with others who also have COPD and learn ways to manage stress.  If you use an oxygen tank, you should use it while you exercise. Work with your health care provider to adjust your oxygen for your physical activity. Your resting flow rate is different from your flow rate during physical activity.  While you are exercising: ? Take slow breaths. ? Pace yourself and do not try to go too fast. ? Purse your lips while breathing out. Pursing your lips is similar to a kissing or whistling position. ? If doing exercise that uses a quick burst of effort, such as weight lifting:  Breathe in before starting the exercise.  Breathe out during the hardest part of the exercise (such as raising the weights). Where to find support You can find support for exercising with COPD  from:  Your health care provider.  A pulmonary rehabilitation program.  Your local health department or community health programs.  Support groups, online or in-person. Your health care provider may be able to recommend support groups. Where to find more information You can find more information about exercising with COPD from:  American Lung Association: ClassInsider.se.  COPD Foundation: https://www.rivera.net/. Contact a health care provider if:  Your symptoms get worse.  You have chest pain.  You have nausea.  You have a fever.  You have trouble talking or catching your breath.  You want to start a new exercise program or a new activity. Summary  COPD is a general term that can be used to describe many different lung problems that cause lung swelling (inflammation) and limit airflow. This includes chronic bronchitis and emphysema.  Exercise and physical activity improve your shortness of breath by increasing blood flow (circulation). This causes your heart to provide more oxygen to your body.  Contact your health care provider before starting any exercise program or new activity. Ask your health care provider what exercises and activities are  safe for you. This information is not intended to replace advice given to you by your health care provider. Make sure you discuss any questions you have with your health care provider. Document Revised: 04/03/2019 Document Reviewed: 01/04/2018 Elsevier Patient Education  2021 [employer].

## 2021-03-01 NOTE — Telephone Encounter (Signed)
-----   Message from Viona Gilmore, [facility] sent at 03/01/2021  2:26 PM EST ----- Regarding: CCM referral Hi,  Can you please place a CCM referral for Ms. Hynes?  Thank you, Maddie

## 2021-03-03 DIAGNOSIS — C44619 Basal cell carcinoma of skin of left upper limb, including shoulder: Secondary | ICD-10-CM | POA: Diagnosis not present

## 2021-03-21 ENCOUNTER — Other Ambulatory Visit: Payer: Self-pay | Admitting: Family Medicine

## 2021-03-21 DIAGNOSIS — I10 Essential (primary) hypertension: Secondary | ICD-10-CM

## 2021-03-30 ENCOUNTER — Ambulatory Visit (INDEPENDENT_AMBULATORY_CARE_PROVIDER_SITE_OTHER): Payer: Medicare Other | Admitting: Family Medicine

## 2021-03-30 ENCOUNTER — Encounter: Payer: Self-pay | Admitting: Family Medicine

## 2021-03-30 ENCOUNTER — Other Ambulatory Visit: Payer: Self-pay

## 2021-03-30 VITALS — BP 128/70 | HR 87 | Resp 16 | Ht 68.0 in | Wt 134.1 lb

## 2021-03-30 DIAGNOSIS — J449 Chronic obstructive pulmonary disease, unspecified: Secondary | ICD-10-CM

## 2021-03-30 DIAGNOSIS — R011 Cardiac murmur, unspecified: Secondary | ICD-10-CM

## 2021-03-30 DIAGNOSIS — F419 Anxiety disorder, unspecified: Secondary | ICD-10-CM

## 2021-03-30 DIAGNOSIS — G620 Drug-induced polyneuropathy: Secondary | ICD-10-CM | POA: Diagnosis not present

## 2021-03-30 DIAGNOSIS — I10 Essential (primary) hypertension: Secondary | ICD-10-CM | POA: Diagnosis not present

## 2021-03-30 DIAGNOSIS — K219 Gastro-esophageal reflux disease without esophagitis: Secondary | ICD-10-CM | POA: Diagnosis not present

## 2021-03-30 DIAGNOSIS — I700 Atherosclerosis of aorta: Secondary | ICD-10-CM

## 2021-03-30 DIAGNOSIS — R0602 Shortness of breath: Secondary | ICD-10-CM | POA: Diagnosis not present

## 2021-03-30 DIAGNOSIS — R5382 Chronic fatigue, unspecified: Secondary | ICD-10-CM

## 2021-03-30 DIAGNOSIS — E042 Nontoxic multinodular goiter: Secondary | ICD-10-CM | POA: Diagnosis not present

## 2021-03-30 DIAGNOSIS — T451X5A Adverse effect of antineoplastic and immunosuppressive drugs, initial encounter: Secondary | ICD-10-CM

## 2021-03-30 LAB — COMPREHENSIVE METABOLIC PANEL
ALT: 10 U/L (ref 0–35)
AST: 18 U/L (ref 0–37)
Albumin: 4.4 g/dL (ref 3.5–5.2)
Alkaline Phosphatase: 52 U/L (ref 39–117)
BUN: 17 mg/dL (ref 6–23)
CO2: 34 mEq/L — ABNORMAL HIGH (ref 19–32)
Calcium: 10.2 mg/dL (ref 8.4–10.5)
Chloride: 99 mEq/L (ref 96–112)
Creatinine, Ser: 0.86 mg/dL (ref 0.40–1.20)
GFR: 63.25 mL/min (ref >60.00)
Glucose, Bld: 92 mg/dL (ref 70–99)
Potassium: 4.4 mEq/L (ref 3.5–5.1)
Sodium: 139 mEq/L (ref 135–145)
Total Bilirubin: 0.5 mg/dL (ref 0.2–1.2)
Total Protein: 7.3 g/dL (ref 6.0–8.3)

## 2021-03-30 LAB — CBC
HCT: 39.6 % (ref 36.0–46.0)
Hemoglobin: 13.7 g/dL (ref 12.0–15.0)
MCHC: 34.4 g/dL (ref 30.0–36.0)
MCV: 89.1 fl (ref 78.0–100.0)
Platelets: 228 10*3/uL (ref 150.0–400.0)
RBC: 4.45 Mil/uL (ref 3.87–5.11)
RDW: 13.3 % (ref 11.5–15.5)
WBC: 5.3 10*3/uL (ref 4.0–10.5)

## 2021-03-30 LAB — BRAIN NATRIURETIC PEPTIDE: Pro B Natriuretic peptide (BNP): 97 pg/mL (ref 0.0–100.0)

## 2021-03-30 LAB — TSH: TSH: 0.57 u[IU]/mL (ref 0.35–4.50)

## 2021-03-30 MED ORDER — LORAZEPAM 0.5 MG PO TABS: 0.2500 mg | ORAL_TABLET | Freq: Two times a day (BID) | ORAL | 3 refills | Status: DC | PRN

## 2021-03-30 NOTE — Assessment & Plan Note (Signed)
Problem has been aggravated by respiratory infections in the past. Asymptomatic at this time. Instructed about warning signs.

## 2021-03-30 NOTE — Assessment & Plan Note (Signed)
Reviewed prior imagines. Discussed Dx and prognosis. She has not tolerated statins in the past. Due to her age and risk of bleeding, I am not recommending antiplatelet therapy.

## 2021-03-30 NOTE — Assessment & Plan Note (Signed)
Problem is well controlled. We discussed some side effects of PPI's. She is afraid of having heartburn again if she tries to discontinue medication, so continue pantoprazole 20 mg daily. GERD precautions also recommended.

## 2021-03-30 NOTE — Assessment & Plan Note (Signed)
This is not a new problem. It is soft and I do not think further cardiac studies are needed at this time. We discussed work-up option, echo could be arranged but she agrees with holding on further testing for now. Instructed about warning signs.

## 2021-03-30 NOTE — Assessment & Plan Note (Addendum)
She has not tolerated gabapentin, Lyrica, and duloxetine. Adequate foot care and fall precautions discussed.

## 2021-03-30 NOTE — Assessment & Plan Note (Addendum)
Problem is stable. She has not tolerated SSRI and SNRI's. Continue lorazepam 0.5 mg 1/2 to 1 tablet twice daily as needed. She has tolerated medication well and has been taking it for years, she understands side effects.

## 2021-03-30 NOTE — Progress Notes (Signed)
HPI: Melanie [name] is a 80 y.o. female, who is here today for 6 months follow up.   She was last seen on 09/09/20. Since her last visit she has seen her dermatologist, underwent skin lesions removal (skin cancer) and mohs surgery.  HTN: She is on Losartan 100 mg daily. She is tolerating medication well. Home BP readings most of the time 120-13's/60-70's. Occasionally SBP 140's and DBP 50's. BP was elevated at the dermatologist's office when mohs surgery was performed. Negative for severe/frequent headache, visual changes,focal weakness, or edema.  Soft murmur heard a few times in the past. Negative for orthopnea or PND.  Lab Results  Component Value Date   CREATININE 0.87 09/09/2020   BUN 18 09/09/2020   NA 136 09/09/2020   K 4.0 09/09/2020   CL 99 09/09/2020   CO2 32 09/09/2020   Fatigue, feeling "winded",it has been going on for "a while" Problem is exacerbated by exertion, it is not alleviated by rest or taking naps. Problem has been stable for years. Did not improve with increasing vit D supplementation from 400 U to 800 U. No known Hx of vitamin D deficiency. She does not feel rested when she wakes up.  No associated cough, wheezing, CP, palpitations. Nasal congestion and clear rhinorrhea. Nasal Atrovent has helped.  COPD: Currently she is not on inhalers. Symptoms are usually exacerbated by respiratory tract infections. CXR 04/2020: COPD without acute abnormality.  Goiter: She has not noted changes in size or dysphagia. She was on Levothyroxine until 12/2015.  08/2002 thyroid US: DIFFUSELY ENLARGED INHOMOGENEOUS THYROID CONSISTENT WITH MULTINODULAR GOITER. Lab Results  Component Value Date   TSH 0.41 02/05/2020   Aortic atherosclerosis has been seen in imagings in the past. Hyperlipidemia on OTC red yeast rice and low-fat diet. She has try atorvastatin and pravastatin in the past, poor tolerance.   Ref Range & Units 10 yr ago Comments  Cholesterol,  Total 100 - 199 mg/dL 188    Triglycerides 0 - 149 mg/dL 188High    HDL >39 mg/dL 67  According to ATP-III Guidelines, HDL-C >59 mg/dL is considered a  negative risk factor for CHD.  VLDL Cholesterol Cal 5 - 40 mg/dL 38    LDL Calculated 0 - 99 mg/dL 83     Chemotherapy-induced peripheral neuropathy. Hx of rectal cancer, followed with oncologist until 2017. Numbness feet sensation. She can feel it more at night but not longer interfering with sleep. She has not noted ulcers or cyanosis. She did not tolerate well Gabapentin,Lyrica,and Duloxetine.  Anxiety and insomnia: She is on Lorazepam 0.5 mg 1/2-1 tab bid prn. Medication helps her sleep. Sleeps about 8-9 hours.  She has been taking Lorazepam for many years. She has not tolerated SSRI and SNRI's.  GERD: She is on Pantoprazole 20 mg daily. She takes medication daily. Heartburn has resolved, it was "terrible" and was affecting her sleep.   Review of Systems  Constitutional: Positive for fatigue. Negative for activity change, appetite change and fever.  HENT: Negative for mouth sores, nosebleeds and sore throat.   Gastrointestinal: Negative for abdominal pain, nausea and vomiting.       Negative for changes in bowel habits.  Genitourinary: Negative for decreased urine volume, dysuria and hematuria.  Musculoskeletal: Positive for arthralgias and back pain.  Neurological: Negative for syncope, facial asymmetry and weakness.  Psychiatric/Behavioral: Negative for confusion. The patient is nervous/anxious.   Rest of ROS, see pertinent positives sand negatives in HPI  Current Outpatient Medications  on File Prior to Visit  Medication Sig Dispense Refill  . acetaminophen (TYLENOL) 650 MG CR tablet Take 650 mg by mouth every 8 (eight) hours as needed.    . Calcium Carbonate (CALCIUM 600 PO) Take 1 tablet by mouth daily.    . cetirizine (ZYRTEC) 10 MG tablet Take 0.5 tablets (5 mg total) by mouth daily. (Patient taking differently:  Take 10 mg by mouth daily.) 30 tablet 1  . fluticasone (FLONASE) 50 MCG/ACT nasal spray Place 1 spray into both nostrils 2 (two) times daily. 16 g 3  . ipratropium (ATROVENT) 0.06 % nasal spray Place 2 sprays into both nostrils 4 (four) times daily. 15 mL 6  . loperamide (IMODIUM) 2 MG capsule Take 2 mg by mouth as needed.    Marland Kitchen losartan (COZAAR) 100 MG tablet TAKE 1 TABLET BY MOUTH EVERY DAY 90 tablet 1  . pantoprazole (PROTONIX) 20 MG tablet 1 tab 30 min before breakfast. Stop omeprazole. 90 tablet 3  . Red Yeast Rice 600 MG CAPS Take 600 mg by mouth 2 (two) times daily.     No current facility-administered medications on file prior to visit.   Past Medical History:  Diagnosis Date  . Anxiety   . Cancer ([place])    rectal ca  . Thyroid disease    Allergies  Allergen Reactions  . Latex Rash  . Macrodantin Shortness Of Breath  . Guaifenesin Er Itching  . Morphine And Related Nausea Only  . Ditropan [Oxybutynin]     Causes diarrhea - contains Magnesium  . Lipitor [Atorvastatin]     rash  . Lisinopril     Diarrhea/cough  . Magnesium-Containing Compounds     Causes diarrhea  . Pravastatin     Leg cramps  . Zoloft [Sertraline Hcl]     Diarrhea    Social History   Socioeconomic History  . Marital status: Married    Spouse name: Not on file  . Number of children: Not on file  . Years of education: Not on file  . Highest education level: Not on file  Occupational History  . Not on file  Tobacco Use  . Smoking status: Former Smoker    Types: Cigarettes  . Smokeless tobacco: Never Used  Substance and Sexual Activity  . Alcohol use: No  . Drug use: No  . Sexual activity: Not on file  Other Topics Concern  . Not on file  Social History Narrative  . Not on file   Social Determinants of Health   Financial Resource Strain: Low Risk   . Difficulty of Paying Living Expenses: Not hard at all  Food Insecurity: Not on file  Transportation Needs: No Transportation Needs  .  Lack of Transportation (Medical): No  . Lack of Transportation (Non-Medical): No  Physical Activity: Not on file  Stress: Not on file  Social Connections: Not on file    Vitals:   03/30/21 1140  BP: 128/70  Pulse: 87  Resp: 16  SpO2: 91%   Body mass index is 20.39 kg/m.   Physical Exam Vitals and nursing note reviewed.  Constitutional:      General: She is not in acute distress.    Appearance: She is well-developed and normal weight.  HENT:     Head: Normocephalic and atraumatic.     Right Ear: Decreased hearing noted.     Left Ear: Decreased hearing noted.     Mouth/Throat:     Mouth: Mucous membranes are moist.  Dentition: Has dentures.     Pharynx: Oropharynx is clear.  Eyes:     Conjunctiva/sclera: Conjunctivae normal.  Cardiovascular:     Rate and Rhythm: Normal rate and regular rhythm.     Pulses:          Dorsalis pedis pulses are 2+ on the right side and 2+ on the left side.     Heart sounds: No murmur heard.   Pulmonary:     Effort: Pulmonary effort is normal. No respiratory distress.     Breath sounds: Normal breath sounds.  Abdominal:     Palpations: Abdomen is soft. There is no hepatomegaly or mass.     Tenderness: There is no abdominal tenderness.  Lymphadenopathy:     Cervical: No cervical adenopathy.  Skin:    General: Skin is warm.     Findings: No erythema or rash.  Neurological:     Mental Status: She is alert and oriented to person, place, and time.     Comments: Mildly unstable gait without assistance.  Psychiatric:        Mood and Affect: Mood is anxious.     Comments: Well groomed, good eye contact.     ASSESSMENT AND PLAN:  Melanie [name] was seen today for 6 months follow-up.  Orders Placed This Encounter  Procedures  . Comprehensive metabolic panel  . TSH  . CBC  . Brain Natriuretic Peptide   Lab Results  Component Value Date   TSH 0.57 03/30/2021   Lab Results  Component Value Date   CREATININE 0.86  03/30/2021   BUN 17 03/30/2021   NA 139 03/30/2021   K 4.4 03/30/2021   CL 99 03/30/2021   CO2 34 (H) 03/30/2021   Estimated Creatinine Clearance: 49.2 mL/min (by C-G formula based on SCr of 0.86 mg/dL).  Lab Results  Component Value Date   ALT 10 03/30/2021   AST 18 03/30/2021   ALKPHOS 52 03/30/2021   BILITOT 0.5 03/30/2021    Multinodular goiter We reviewed prior thyroid US. She has not noted growth, so I do not think thyroid US is needed at this time. Further recommendation will be given according to TSH result.  COPD (chronic obstructive pulmonary disease) (HCC) Problem has been aggravated by respiratory infections in the past. Asymptomatic at this time. Instructed about warning signs.   Anxiety disorder Problem is stable. She has not tolerated SSRI and SNRI's. Continue lorazepam 0.5 mg 1/2 to 1 tablet twice daily as needed. She has tolerated medication well and has been taking it for years, she understands side effects.   Atherosclerosis of aorta [facility]) Reviewed prior imagines. Discussed Dx and prognosis. She has not tolerated statins in the past. Due to her age and risk of bleeding, I am not recommending antiplatelet therapy.  Heart murmur This is not a new problem. It is soft and I do not think further cardiac studies are needed at this time. We discussed work-up option, echo could be arranged but she agrees with holding on further testing for now. Instructed about warning signs.  Chronic fatigue We discussed possible etiologies. Some of her chronic medical problems and medications could be aggravating problem. Further recommendation will be given according to lab results.  Peripheral neuropathy due to chemotherapy [facility]) She has not tolerated gabapentin, Lyrica, and duloxetine. Adequate foot care and fall precautions discussed.  GERD (gastroesophageal reflux disease) Problem is well controlled. We discussed some side effects of PPI's. She is afraid of  having heartburn again  if she tries to discontinue medication, so continue pantoprazole 20 mg daily. GERD precautions also recommended.  SOB (shortness of breath) Chronic and stable. Deconditioning,allergies,and even COPD could be contributing factors. Further recommendations according to lab results. Instructed about warning signs.  Primary hypertension BP adequately controlled. Continue Losartan 100 mg daily. Low salt diet. Continue monitoring BP regularly.  Spent 52 minutes with pt.  During this time history was obtained and documented, examination was performed, prior labs/imaging reviewed, and assessment/plan discussed.   Return in about 6 months (around 09/29/2021) for HTN, anxiety..   Amare Kontos G. [place], MD  [facility]. [place] office.    A few things to remember from today's visit:   Peripheral neuropathy due to chemotherapy ([place])  Anxiety disorder, unspecified type - Plan: LORazepam (ATIVAN) 0.5 MG tablet  Essential hypertension - Plan: Comprehensive metabolic panel  Gastroesophageal reflux disease without esophagitis  Hypothyroidism, unspecified type - Plan: TSH  SOB (shortness of breath) - Plan: CBC, Brain Natriuretic Peptide  If you need refills please call your pharmacy. Do not use My Chart to request refills or for acute issues that need immediate attention.   You have COPD but it seems to exacerbate with respiratory infections. Heart murmur is soft, I do not think it is causing symptoms at this time.Let me know if you decide about having heart test done. No changes in Pantoprazole. You have not tolerated cholesterol medication before, so continue red yeast rice. Goiter seems to be stable, today I am checking thyroid function.  No changes in rest.  Please be sure medication list is accurate. If a new problem present, please set up appointment sooner than planned today.

## 2021-03-30 NOTE — Assessment & Plan Note (Signed)
Chronic and stable. Deconditioning,allergies,and even COPD could be contributing factors. Further recommendations according to lab results. Instructed about warning signs.

## 2021-03-30 NOTE — Assessment & Plan Note (Signed)
We discussed possible etiologies. Some of her chronic medical problems and medications could be aggravating problem. Further recommendation will be given according to lab results.

## 2021-03-30 NOTE — Assessment & Plan Note (Signed)
We reviewed prior thyroid US. She has not noted growth, so I do not think thyroid US is needed at this time. Further recommendation will be given according to TSH result.

## 2021-03-30 NOTE — Patient Instructions (Addendum)
A few things to remember from today's visit:   Peripheral neuropathy due to chemotherapy ([place])  Anxiety disorder, unspecified type - Plan: LORazepam (ATIVAN) 0.5 MG tablet  Essential hypertension - Plan: Comprehensive metabolic panel  Gastroesophageal reflux disease without esophagitis  Hypothyroidism, unspecified type - Plan: TSH  SOB (shortness of breath) - Plan: CBC, Brain Natriuretic Peptide  If you need refills please call your pharmacy. Do not use My Chart to request refills or for acute issues that need immediate attention.   You have COPD but it seems to exacerbate with respiratory infections. Heart murmur is soft, I do not think it is causing symptoms at this time.Let me know if you decide about having heart test done. No changes in Pantoprazole. You have not tolerated cholesterol medication before, so continue red yeast rice. Goiter seems to be stable, today I am checking thyroid function.  No changes in rest.  Please be sure medication list is accurate. If a new problem present, please set up appointment sooner than planned today.

## 2021-04-21 DIAGNOSIS — C44612 Basal cell carcinoma of skin of right upper limb, including shoulder: Secondary | ICD-10-CM | POA: Diagnosis not present

## 2021-05-09 ENCOUNTER — Other Ambulatory Visit: Payer: Self-pay | Admitting: Family Medicine

## 2021-05-09 DIAGNOSIS — K219 Gastro-esophageal reflux disease without esophagitis: Secondary | ICD-10-CM

## 2021-05-11 ENCOUNTER — Inpatient Hospital Stay (HOSPITAL_COMMUNITY)
Admission: EM | Admit: 2021-05-11 | Discharge: 2021-05-20 | DRG: 208 | Disposition: A | Discharge status: Home Health | Payer: Medicare Other | Attending: General Surgery | Admitting: General Surgery

## 2021-05-11 ENCOUNTER — Encounter (HOSPITAL_COMMUNITY): Payer: Self-pay

## 2021-05-11 ENCOUNTER — Emergency Department (HOSPITAL_COMMUNITY): Payer: Medicare Other

## 2021-05-11 ENCOUNTER — Inpatient Hospital Stay (HOSPITAL_COMMUNITY): Payer: Medicare Other

## 2021-05-11 DIAGNOSIS — J449 Chronic obstructive pulmonary disease, unspecified: Secondary | ICD-10-CM | POA: Diagnosis present

## 2021-05-11 DIAGNOSIS — E871 Hypo-osmolality and hyponatremia: Secondary | ICD-10-CM | POA: Diagnosis not present

## 2021-05-11 DIAGNOSIS — J942 Hemothorax: Secondary | ICD-10-CM | POA: Diagnosis not present

## 2021-05-11 DIAGNOSIS — J918 Pleural effusion in other conditions classified elsewhere: Secondary | ICD-10-CM | POA: Diagnosis present

## 2021-05-11 DIAGNOSIS — I10 Essential (primary) hypertension: Secondary | ICD-10-CM | POA: Diagnosis present

## 2021-05-11 DIAGNOSIS — T07XXXA Unspecified multiple injuries, initial encounter: Secondary | ICD-10-CM | POA: Diagnosis not present

## 2021-05-11 DIAGNOSIS — J939 Pneumothorax, unspecified: Secondary | ICD-10-CM | POA: Diagnosis not present

## 2021-05-11 DIAGNOSIS — M25551 Pain in right hip: Secondary | ICD-10-CM | POA: Diagnosis not present

## 2021-05-11 DIAGNOSIS — Y92013 Bedroom of single-family (private) house as the place of occurrence of the external cause: Secondary | ICD-10-CM

## 2021-05-11 DIAGNOSIS — J9602 Acute respiratory failure with hypercapnia: Secondary | ICD-10-CM | POA: Diagnosis not present

## 2021-05-11 DIAGNOSIS — Z20822 Contact with and (suspected) exposure to covid-19: Secondary | ICD-10-CM | POA: Diagnosis not present

## 2021-05-11 DIAGNOSIS — Z9911 Dependence on respirator [ventilator] status: Secondary | ICD-10-CM | POA: Diagnosis not present

## 2021-05-11 DIAGNOSIS — I4891 Unspecified atrial fibrillation: Secondary | ICD-10-CM | POA: Diagnosis not present

## 2021-05-11 DIAGNOSIS — J9600 Acute respiratory failure, unspecified whether with hypoxia or hypercapnia: Secondary | ICD-10-CM | POA: Diagnosis not present

## 2021-05-11 DIAGNOSIS — R0902 Hypoxemia: Secondary | ICD-10-CM | POA: Diagnosis not present

## 2021-05-11 DIAGNOSIS — J439 Emphysema, unspecified: Secondary | ICD-10-CM | POA: Diagnosis not present

## 2021-05-11 DIAGNOSIS — R110 Nausea: Secondary | ICD-10-CM | POA: Diagnosis not present

## 2021-05-11 DIAGNOSIS — W06XXXA Fall from bed, initial encounter: Secondary | ICD-10-CM | POA: Diagnosis present

## 2021-05-11 DIAGNOSIS — R54 Age-related physical debility: Secondary | ICD-10-CM | POA: Diagnosis not present

## 2021-05-11 DIAGNOSIS — R339 Retention of urine, unspecified: Secondary | ICD-10-CM | POA: Diagnosis not present

## 2021-05-11 DIAGNOSIS — M542 Cervicalgia: Secondary | ICD-10-CM | POA: Diagnosis not present

## 2021-05-11 DIAGNOSIS — J90 Pleural effusion, not elsewhere classified: Secondary | ICD-10-CM | POA: Diagnosis not present

## 2021-05-11 DIAGNOSIS — J9811 Atelectasis: Secondary | ICD-10-CM | POA: Diagnosis not present

## 2021-05-11 DIAGNOSIS — S2241XA Multiple fractures of ribs, right side, initial encounter for closed fracture: Secondary | ICD-10-CM | POA: Diagnosis not present

## 2021-05-11 DIAGNOSIS — R0602 Shortness of breath: Secondary | ICD-10-CM

## 2021-05-11 DIAGNOSIS — Z9889 Other specified postprocedural states: Secondary | ICD-10-CM

## 2021-05-11 DIAGNOSIS — K219 Gastro-esophageal reflux disease without esophagitis: Secondary | ICD-10-CM | POA: Diagnosis present

## 2021-05-11 DIAGNOSIS — S272XXA Traumatic hemopneumothorax, initial encounter: Secondary | ICD-10-CM | POA: Diagnosis present

## 2021-05-11 DIAGNOSIS — S2231XA Fracture of one rib, right side, initial encounter for closed fracture: Secondary | ICD-10-CM | POA: Diagnosis not present

## 2021-05-11 DIAGNOSIS — S225XXA Flail chest, initial encounter for closed fracture: Principal | ICD-10-CM | POA: Diagnosis present

## 2021-05-11 DIAGNOSIS — M25519 Pain in unspecified shoulder: Secondary | ICD-10-CM | POA: Diagnosis not present

## 2021-05-11 DIAGNOSIS — J984 Other disorders of lung: Secondary | ICD-10-CM | POA: Diagnosis not present

## 2021-05-11 DIAGNOSIS — J9601 Acute respiratory failure with hypoxia: Secondary | ICD-10-CM | POA: Diagnosis present

## 2021-05-11 DIAGNOSIS — S3991XA Unspecified injury of abdomen, initial encounter: Secondary | ICD-10-CM | POA: Diagnosis not present

## 2021-05-11 DIAGNOSIS — T1490XA Injury, unspecified, initial encounter: Secondary | ICD-10-CM

## 2021-05-11 DIAGNOSIS — J9382 Other air leak: Secondary | ICD-10-CM | POA: Diagnosis present

## 2021-05-11 DIAGNOSIS — R0689 Other abnormalities of breathing: Secondary | ICD-10-CM | POA: Diagnosis not present

## 2021-05-11 DIAGNOSIS — R000 Tachycardia, unspecified: Secondary | ICD-10-CM | POA: Diagnosis not present

## 2021-05-11 DIAGNOSIS — M7989 Other specified soft tissue disorders: Secondary | ICD-10-CM | POA: Diagnosis not present

## 2021-05-11 DIAGNOSIS — I4819 Other persistent atrial fibrillation: Secondary | ICD-10-CM | POA: Diagnosis not present

## 2021-05-11 DIAGNOSIS — Z9689 Presence of other specified functional implants: Secondary | ICD-10-CM

## 2021-05-11 DIAGNOSIS — R0603 Acute respiratory distress: Secondary | ICD-10-CM | POA: Diagnosis not present

## 2021-05-11 DIAGNOSIS — S0990XA Unspecified injury of head, initial encounter: Secondary | ICD-10-CM | POA: Diagnosis not present

## 2021-05-11 HISTORY — DX: Chronic obstructive pulmonary disease, unspecified: J44.9

## 2021-05-11 LAB — CBC
HCT: 41.4 % (ref 36.0–46.0)
HCT: 37.8 % (ref 36.0–46.0)
Hemoglobin: 13 g/dL (ref 12.0–15.0)
Hemoglobin: 12 g/dL (ref 12.0–15.0)
MCH: 29.7 pg (ref 26.0–34.0)
MCH: 30.1 pg (ref 26.0–34.0)
MCHC: 31.4 g/dL (ref 30.0–36.0)
MCHC: 31.7 g/dL (ref 30.0–36.0)
MCV: 94.7 fL (ref 80.0–100.0)
MCV: 94.7 fL (ref 80.0–100.0)
Platelets: 274 10*3/uL (ref 150–400)
Platelets: 205 10*3/uL (ref 150–400)
RBC: 4.37 MIL/uL (ref 3.87–5.11)
RBC: 3.99 MIL/uL (ref 3.87–5.11)
RDW: 12.5 % (ref 11.5–15.5)
RDW: 12.5 % (ref 11.5–15.5)
WBC: 8.4 10*3/uL (ref 4.0–10.5)
WBC: 10.5 10*3/uL (ref 4.0–10.5)
nRBC: 0 % (ref 0.0–0.2)
nRBC: 0 % (ref 0.0–0.2)

## 2021-05-11 LAB — BASIC METABOLIC PANEL
Anion gap: 6 (ref 5–15)
BUN: 17 mg/dL (ref 8–23)
CO2: 29 mmol/L (ref 22–32)
Calcium: 8.8 mg/dL — ABNORMAL LOW (ref 8.9–10.3)
Chloride: 103 mmol/L (ref 98–111)
Creatinine, Ser: 0.97 mg/dL (ref 0.44–1.00)
GFR, Estimated: 59 mL/min — ABNORMAL LOW (ref >60)
Glucose, Bld: 110 mg/dL — ABNORMAL HIGH (ref 70–99)
Potassium: 4.5 mmol/L (ref 3.5–5.1)
Sodium: 138 mmol/L (ref 135–145)

## 2021-05-11 LAB — URINALYSIS, ROUTINE W REFLEX MICROSCOPIC
Bilirubin Urine: NEGATIVE
Glucose, UA: NEGATIVE mg/dL
Hgb urine dipstick: NEGATIVE
Ketones, ur: NEGATIVE mg/dL
Leukocytes,Ua: NEGATIVE
Nitrite: NEGATIVE
Protein, ur: NEGATIVE mg/dL
Specific Gravity, Urine: 1.038 — ABNORMAL HIGH (ref 1.005–1.030)
pH: 5 (ref 5.0–8.0)

## 2021-05-11 LAB — LACTIC ACID, PLASMA: Lactic Acid, Venous: 1.4 mmol/L (ref 0.5–1.9)

## 2021-05-11 LAB — POCT I-STAT 7, (LYTES, BLD GAS, ICA,H+H)
Acid-Base Excess: 2 mmol/L (ref 0.0–2.0)
Bicarbonate: 31.4 mmol/L — ABNORMAL HIGH (ref 20.0–28.0)
Calcium, Ion: 1.25 mmol/L (ref 1.15–1.40)
HCT: 36 % (ref 36.0–46.0)
Hemoglobin: 12.2 g/dL (ref 12.0–15.0)
O2 Saturation: 100 %
Patient temperature: 99.1
Potassium: 4.5 mmol/L (ref 3.5–5.1)
Sodium: 138 mmol/L (ref 135–145)
TCO2: 34 mmol/L — ABNORMAL HIGH (ref 22–32)
pCO2 arterial: 77.8 mmHg (ref 32.0–48.0)
pH, Arterial: 7.216 — ABNORMAL LOW (ref 7.350–7.450)
pO2, Arterial: 410 mmHg — ABNORMAL HIGH (ref 83.0–108.0)

## 2021-05-11 LAB — COMPREHENSIVE METABOLIC PANEL
ALT: 13 U/L (ref 0–44)
AST: 25 U/L (ref 15–41)
Albumin: 3.6 g/dL (ref 3.5–5.0)
Alkaline Phosphatase: 54 U/L (ref 38–126)
Anion gap: 7 (ref 5–15)
BUN: 17 mg/dL (ref 8–23)
CO2: 29 mmol/L (ref 22–32)
Calcium: 8.8 mg/dL — ABNORMAL LOW (ref 8.9–10.3)
Chloride: 102 mmol/L (ref 98–111)
Creatinine, Ser: 0.89 mg/dL (ref 0.44–1.00)
GFR, Estimated: >60 mL/min (ref >60)
Glucose, Bld: 144 mg/dL — ABNORMAL HIGH (ref 70–99)
Potassium: 3.6 mmol/L (ref 3.5–5.1)
Sodium: 138 mmol/L (ref 135–145)
Total Bilirubin: 0.5 mg/dL (ref 0.3–1.2)
Total Protein: 6.7 g/dL (ref 6.5–8.1)

## 2021-05-11 LAB — SAMPLE TO BLOOD BANK

## 2021-05-11 LAB — RAPID URINE DRUG SCREEN, HOSP PERFORMED
Amphetamines: NOT DETECTED
Barbiturates: NOT DETECTED
Benzodiazepines: POSITIVE — AB
Cocaine: NOT DETECTED
Opiates: NOT DETECTED
Tetrahydrocannabinol: NOT DETECTED

## 2021-05-11 LAB — I-STAT CHEM 8, ED
BUN: 21 mg/dL (ref 8–23)
Calcium, Ion: 1.19 mmol/L (ref 1.15–1.40)
Chloride: 101 mmol/L (ref 98–111)
Creatinine, Ser: 0.8 mg/dL (ref 0.44–1.00)
Glucose, Bld: 137 mg/dL — ABNORMAL HIGH (ref 70–99)
HCT: 39 % (ref 36.0–46.0)
Hemoglobin: 13.3 g/dL (ref 12.0–15.0)
Potassium: 3.6 mmol/L (ref 3.5–5.1)
Sodium: 139 mmol/L (ref 135–145)
TCO2: 30 mmol/L (ref 22–32)

## 2021-05-11 LAB — RESP PANEL BY RT-PCR (FLU A&B, COVID) ARPGX2
Influenza A by PCR: NEGATIVE
Influenza B by PCR: NEGATIVE
SARS Coronavirus 2 by RT PCR: NEGATIVE

## 2021-05-11 LAB — ETHANOL: Alcohol, Ethyl (B): <10 mg/dL (ref <10)

## 2021-05-11 LAB — PROTIME-INR
INR: 1 (ref 0.8–1.2)
Prothrombin Time: 13 seconds (ref 11.4–15.2)

## 2021-05-11 LAB — MRSA PCR SCREENING: MRSA by PCR: NEGATIVE

## 2021-05-11 MED ORDER — SODIUM CHLORIDE 0.9 % IV SOLN
INTRAVENOUS | Status: AC | PRN
  Administered 2021-05-11: 1000 mL via INTRAVENOUS

## 2021-05-11 MED ORDER — CHLORHEXIDINE GLUCONATE CLOTH 2 % EX PADS
6.0000 | MEDICATED_PAD | Freq: Every day | CUTANEOUS | Status: DC
  Administered 2021-05-12 – 2021-05-20 (×9): 6 via TOPICAL

## 2021-05-11 MED ORDER — FENTANYL CITRATE (PF) 100 MCG/2ML IJ SOLN
INTRAMUSCULAR | Status: AC | PRN
  Administered 2021-05-11: 50 ug via INTRAVENOUS

## 2021-05-11 MED ORDER — DOCUSATE SODIUM 100 MG PO CAPS
100.0000 mg | ORAL_CAPSULE | Freq: Two times a day (BID) | ORAL | Status: DC
  Administered 2021-05-11 – 2021-05-19 (×15): 100 mg via ORAL
  Filled 2021-05-11 (×17): qty 1

## 2021-05-11 MED ORDER — ONDANSETRON HCL 4 MG/2ML IJ SOLN
INTRAMUSCULAR | Status: AC | PRN
  Administered 2021-05-11: 4 mg via INTRAVENOUS

## 2021-05-11 MED ORDER — DOCUSATE SODIUM 50 MG/5ML PO LIQD
100.0000 mg | Freq: Two times a day (BID) | ORAL | Status: DC
  Administered 2021-05-11: 100 mg
  Filled 2021-05-11: qty 10

## 2021-05-11 MED ORDER — SODIUM CHLORIDE 0.9 % IV SOLN: 10.0000 mL/h | Freq: Once | INTRAVENOUS | Status: DC

## 2021-05-11 MED ORDER — ETOMIDATE 2 MG/ML IV SOLN
INTRAVENOUS | Status: AC | PRN
  Administered 2021-05-11: 20 mg via INTRAVENOUS

## 2021-05-11 MED ORDER — IOHEXOL 300 MG/ML  SOLN
75.0000 mL | Freq: Once | INTRAMUSCULAR | Status: AC | PRN
  Administered 2021-05-11: 75 mL via INTRAVENOUS

## 2021-05-11 MED ORDER — FENTANYL 2500MCG IN NS 250ML (10MCG/ML) PREMIX INFUSION
0.0000 ug/h | INTRAVENOUS | Status: DC
Start: 2021-05-11 — End: 2021-05-11
  Administered 2021-05-11: 25 ug/h via INTRAVENOUS
  Filled 2021-05-11: qty 250

## 2021-05-11 MED ORDER — IPRATROPIUM-ALBUTEROL 0.5-2.5 (3) MG/3ML IN SOLN
3.0000 mL | RESPIRATORY_TRACT | Status: DC | PRN
  Administered 2021-05-19: 3 mL via RESPIRATORY_TRACT
  Filled 2021-05-11: qty 3

## 2021-05-11 MED ORDER — ONDANSETRON HCL 4 MG/2ML IJ SOLN
4.0000 mg | Freq: Four times a day (QID) | INTRAMUSCULAR | Status: DC | PRN
  Administered 2021-05-12 – 2021-05-14 (×6): 4 mg via INTRAVENOUS
  Filled 2021-05-11 (×7): qty 2

## 2021-05-11 MED ORDER — ORAL CARE MOUTH RINSE
15.0000 mL | OROMUCOSAL | Status: DC
  Administered 2021-05-11 (×7): 15 mL via OROMUCOSAL

## 2021-05-11 MED ORDER — OXYCODONE HCL 5 MG PO TABS
2.5000 mg | ORAL_TABLET | ORAL | Status: DC | PRN
Start: 2021-05-11 — End: 2021-05-19
  Administered 2021-05-11 – 2021-05-19 (×29): 5 mg via ORAL
  Filled 2021-05-11 (×30): qty 1

## 2021-05-11 MED ORDER — ACETAMINOPHEN 325 MG PO TABS
650.0000 mg | ORAL_TABLET | ORAL | Status: DC | PRN
Start: 2021-05-11 — End: 2021-05-17
  Administered 2021-05-11 – 2021-05-16 (×7): 650 mg via ORAL
  Filled 2021-05-11 (×7): qty 2

## 2021-05-11 MED ORDER — LOSARTAN POTASSIUM 50 MG PO TABS: 100.0000 mg | ORAL_TABLET | Freq: Every day | ORAL | Status: DC

## 2021-05-11 MED ORDER — ORAL CARE MOUTH RINSE
15.0000 mL | Freq: Two times a day (BID) | OROMUCOSAL | Status: DC
  Administered 2021-05-12 – 2021-05-20 (×9): 15 mL via OROMUCOSAL

## 2021-05-11 MED ORDER — CHLORHEXIDINE GLUCONATE 0.12% ORAL RINSE (MEDLINE KIT)
15.0000 mL | Freq: Two times a day (BID) | OROMUCOSAL | Status: DC
  Administered 2021-05-11 (×2): 15 mL via OROMUCOSAL

## 2021-05-11 MED ORDER — METOPROLOL TARTRATE 5 MG/5ML IV SOLN
5.0000 mg | Freq: Four times a day (QID) | INTRAVENOUS | Status: DC | PRN
  Administered 2021-05-16 – 2021-05-17 (×3): 5 mg via INTRAVENOUS
  Filled 2021-05-11 (×4): qty 5

## 2021-05-11 MED ORDER — PROPOFOL 1000 MG/100ML IV EMUL
5.0000 ug/kg/min | INTRAVENOUS | Status: DC
  Administered 2021-05-11: 5 ug/kg/min via INTRAVENOUS

## 2021-05-11 MED ORDER — ENOXAPARIN SODIUM 30 MG/0.3ML IJ SOSY
30.0000 mg | PREFILLED_SYRINGE | Freq: Two times a day (BID) | INTRAMUSCULAR | Status: DC
  Administered 2021-05-12 – 2021-05-20 (×17): 30 mg via SUBCUTANEOUS
  Filled 2021-05-11 (×17): qty 0.3

## 2021-05-11 MED ORDER — CHLORHEXIDINE GLUCONATE 0.12 % MT SOLN
15.0000 mL | Freq: Two times a day (BID) | OROMUCOSAL | Status: DC
  Administered 2021-05-11 – 2021-05-20 (×15): 15 mL via OROMUCOSAL
  Filled 2021-05-11 (×14): qty 15

## 2021-05-11 MED ORDER — SUCCINYLCHOLINE CHLORIDE 20 MG/ML IJ SOLN
INTRAMUSCULAR | Status: AC | PRN
  Administered 2021-05-11: 100 mg via INTRAVENOUS

## 2021-05-11 MED ORDER — KETOROLAC TROMETHAMINE 15 MG/ML IJ SOLN
15.0000 mg | Freq: Four times a day (QID) | INTRAMUSCULAR | Status: AC
  Administered 2021-05-11 – 2021-05-16 (×20): 15 mg via INTRAVENOUS
  Filled 2021-05-11 (×20): qty 1

## 2021-05-11 MED ORDER — KCL IN DEXTROSE-NACL 20-5-0.45 MEQ/L-%-% IV SOLN
INTRAVENOUS | Status: DC
  Filled 2021-05-11 (×3): qty 1000

## 2021-05-11 MED ORDER — PROPOFOL 1000 MG/100ML IV EMUL
INTRAVENOUS | Status: AC
  Filled 2021-05-11: qty 100

## 2021-05-11 MED ORDER — MORPHINE SULFATE (PF) 2 MG/ML IV SOLN
1.0000 mg | INTRAVENOUS | Status: DC | PRN
  Administered 2021-05-11 – 2021-05-13 (×3): 2 mg via INTRAVENOUS
  Filled 2021-05-11 (×4): qty 1

## 2021-05-11 MED ORDER — BISACODYL 10 MG RE SUPP: 10.0000 mg | Freq: Every day | RECTAL | Status: DC | PRN

## 2021-05-11 MED ORDER — ONDANSETRON 4 MG PO TBDP: 4.0000 mg | ORAL_TABLET | Freq: Four times a day (QID) | ORAL | Status: DC | PRN

## 2021-05-11 MED ORDER — FENTANYL CITRATE (PF) 100 MCG/2ML IJ SOLN
INTRAMUSCULAR | Status: AC
  Administered 2021-05-11: 50 ug via INTRAVENOUS
  Filled 2021-05-11: qty 2

## 2021-05-11 MED ORDER — FENTANYL CITRATE (PF) 100 MCG/2ML IJ SOLN: 50.0000 ug | Freq: Once | INTRAMUSCULAR | Status: AC

## 2021-05-11 MED ORDER — METHOCARBAMOL 1000 MG/10ML IJ SOLN
1000.0000 mg | Freq: Three times a day (TID) | INTRAVENOUS | Status: DC
  Administered 2021-05-11 – 2021-05-14 (×8): 1000 mg via INTRAVENOUS
  Filled 2021-05-11 (×12): qty 10

## 2021-05-11 MED ORDER — ACETAMINOPHEN 325 MG PO TABS: 650.0000 mg | ORAL_TABLET | ORAL | Status: DC | PRN

## 2021-05-11 MED ORDER — PANTOPRAZOLE SODIUM 20 MG PO TBEC
20.0000 mg | DELAYED_RELEASE_TABLET | Freq: Every day | ORAL | Status: DC
  Filled 2021-05-11: qty 1

## 2021-05-11 MED ORDER — ACETAMINOPHEN 325 MG PO TABS
650.0000 mg | ORAL_TABLET | ORAL | Status: DC | PRN
  Administered 2021-05-11: 650 mg
  Filled 2021-05-11: qty 2

## 2021-05-11 MED ORDER — PANTOPRAZOLE SODIUM 40 MG PO TBEC
40.0000 mg | DELAYED_RELEASE_TABLET | Freq: Every day | ORAL | Status: DC
  Administered 2021-05-12 – 2021-05-20 (×9): 40 mg via ORAL
  Filled 2021-05-11 (×10): qty 1

## 2021-05-11 MED ORDER — DOCUSATE SODIUM 100 MG PO CAPS: 100.0000 mg | ORAL_CAPSULE | Freq: Two times a day (BID) | ORAL | Status: DC

## 2021-05-11 MED ORDER — PANTOPRAZOLE SODIUM 40 MG PO PACK
40.0000 mg | PACK | Freq: Every day | ORAL | Status: DC
  Administered 2021-05-11: 40 mg
  Filled 2021-05-11: qty 20

## 2021-05-11 NOTE — ED Notes (Signed)
Patient transported to CT 

## 2021-05-11 NOTE — ED Provider Notes (Signed)
[place] EMERGENCY DEPARTMENT Provider Note   CSN: [id number] Arrival date & time: 05/11/21  0031     History Chief Complaint  Patient presents with  . Trauma    Melanie [name] is a 80 y.o. female.  Patient brought to the emergency department by ambulance from home after a fall.  Patient reports falling out of bed tonight, landing on her right side.  Patient complaining of severe right chest pain.  EMS report that the patient was very hypoxic upon arrival, room air oxygen saturations were in the 50s.  Patient struggling with diffuse right-sided chest wall tenderness and deformity.  Patient placed on nonrebreather oxygen with improvement of her oxygen saturations and transported to the ER.        Past Medical History:  Diagnosis Date  . COPD (chronic obstructive pulmonary disease) (HCC)     There are no problems to display for this patient.   History reviewed. No pertinent surgical history.   OB History   No obstetric history on file.     History reviewed. No pertinent family history.     Home Medications Prior to Admission medications   Not on File    Allergies    Patient has no allergy information on record.  Review of Systems   Review of Systems  Respiratory: Positive for shortness of breath.   Cardiovascular: Positive for chest pain.  All other systems reviewed and are negative.   Physical Exam Updated Vital Signs BP 109/68   Pulse 84   Temp 98 F (36.7 C) (Temporal)   Resp (!) 24   Ht 5\' 8"  (1.727 m)   Wt 61.2 kg   SpO2 100%   BMI 20.53 kg/m   Physical Exam Vitals and nursing note reviewed.  Constitutional:      General: She is in acute distress.     Appearance: Normal appearance. She is well-developed.  HENT:     Head: Normocephalic and atraumatic.     Right Ear: Hearing normal.     Left Ear: Hearing normal.     Nose: Nose normal.  Eyes:     Conjunctiva/sclera: Conjunctivae normal.     Pupils: Pupils are  equal, round, and reactive to light.  Cardiovascular:     Rate and Rhythm: Regular rhythm.     Heart sounds: S1 normal and S2 normal. No murmur heard. No friction rub. No gallop.   Pulmonary:     Effort: Tachypnea, accessory muscle usage, prolonged expiration and respiratory distress present.     Breath sounds: Decreased air movement present. Examination of the right-lower field reveals decreased breath sounds. Decreased breath sounds and wheezing present.  Chest:     Chest wall: Tenderness and crepitus present.    Abdominal:     General: Bowel sounds are normal.     Palpations: Abdomen is soft.     Tenderness: There is no abdominal tenderness. There is no guarding or rebound. Negative signs include Murphy's sign and McBurney's sign.     Hernia: No hernia is present.  Musculoskeletal:        General: Normal range of motion.     Cervical back: Normal range of motion and neck supple.  Skin:    General: Skin is warm and dry.     Findings: No rash.  Neurological:     Mental Status: She is alert and oriented to person, place, and time.     GCS: GCS eye subscore is 4. GCS verbal subscore is  5. GCS motor subscore is 6.     Cranial Nerves: No cranial nerve deficit.     Sensory: No sensory deficit.     Coordination: Coordination normal.  Psychiatric:        Speech: Speech normal.        Behavior: Behavior normal.        Thought Content: Thought content normal.     ED Results / Procedures / Treatments   Labs (all labs ordered are listed, but only abnormal results are displayed) Labs Reviewed  I-STAT CHEM 8, ED - Abnormal; Notable for the following components:      Result Value   Glucose, Bld 137 (*)    All other components within normal limits  RESP PANEL BY RT-PCR (FLU A&B, COVID) ARPGX2  COMPREHENSIVE METABOLIC PANEL  CBC  ETHANOL  URINALYSIS, ROUTINE W REFLEX MICROSCOPIC  LACTIC ACID, PLASMA  PROTIME-INR  SAMPLE TO BLOOD BANK    EKG None  Radiology DG Chest Port 1  View  Result Date: 05/11/2021 CLINICAL DATA:  Level 1 trauma EXAM: PORTABLE CHEST 1 VIEW COMPARISON:  May 21, 2020 FINDINGS: The heart size and mediastinal contours are within normal limits. Multiple displaced right-sided rib fractures including the right sixth, seventh, eighth and ninth ribs. Small small right hydropneumothorax with left basilar airspace opacities. No evidence of tension. Left lung is clear. Right chest wall subcutaneous emphysema. IMPRESSION: 1. Small right hydropneumothorax without evidence of tension. Right basilar airspace opacities. 2. Multiple displaced right-sided rib fractures with associated right basilar airspace opacities. Electronically Signed   By: Dahlia Bailiff MD   On: 05/11/2021 01:07    Procedures Procedure Name: Intubation Date/Time: 05/11/2021 1:16 AM Performed by: Orpah Greek, MD Pre-anesthesia Checklist: Patient identified, Patient being monitored, Emergency Drugs available, Timeout performed and Suction available Oxygen Delivery Method: Non-rebreather mask Preoxygenation: Pre-oxygenation with 100% oxygen Induction Type: Rapid sequence Ventilation: Mask ventilation without difficulty Laryngoscope Size: Glidescope and 3 Grade View: Grade I Tube size: 7.0 mm Number of attempts: 1 Placement Confirmation: ETT inserted through vocal cords under direct vision,  CO2 detector and Breath sounds checked- equal and bilateral Secured at: 23 cm Tube secured with: ETT holder        Medications Ordered in ED Medications  ondansetron (ZOFRAN) injection (4 mg Intravenous Given 05/11/21 0040)  etomidate (AMIDATE) injection (20 mg Intravenous Given 05/11/21 0043)  succinylcholine (ANECTINE) injection (100 mg Intravenous Given 05/11/21 0043)  propofol (DIPRIVAN) 1000 MG/100ML infusion (10 mcg/kg/min  61.2 kg Intravenous Rate/Dose Change 05/11/21 0106)  fentaNYL (SUBLIMAZE) injection 50 mcg (has no administration in time range)  fentaNYL (SUBLIMAZE)  injection (50 mcg Intravenous Given 05/11/21 0059)  0.9 %  sodium chloride infusion (1,000 mLs Intravenous New Bag/Given 05/11/21 0106)  iohexol (OMNIPAQUE) 300 MG/ML solution 75 mL (75 mLs Intravenous Contrast Given 05/11/21 0103)    ED Course  I have reviewed the triage vital signs and the nursing notes.  Pertinent labs & imaging results that were available during my care of the patient were reviewed by me and considered in my medical decision making (see chart for details).    MDM Rules/Calculators/A&P                          Patient presented to the emergency department as a level 1 trauma after fall.  Patient fell from bed injuring the right chest wall.  Patient does have a history of COPD.  She was hypoxic and tachypneic when  EMS found her.  Oxygen saturations improved with nonrebreather facemask.  Patient with noted defect of the right chest wall on palpation.  Initial chest x-ray with likely some faint amount of hemothorax and a small pneumothorax.  Patient in a significant amount of pain at arrival, decision was made to intubate her to help control her pain as well as reduce her work of breathing.  Intubation was performed without difficulty.  Chest tube placed by Dr. Barry Dienes.  Patient to be admitted by trauma service.  CRITICAL CARE Performed by: Orpah Greek   Total critical care time: 30 minutes  Critical care time was exclusive of separately billable procedures and treating other patients.  Critical care was necessary to treat or prevent imminent or life-threatening deterioration.  Critical care was time spent personally by me on the following activities: development of treatment plan with patient and/or surrogate as well as nursing, discussions with consultants, evaluation of patient's response to treatment, examination of patient, obtaining history from patient or surrogate, ordering and performing treatments and interventions, ordering and review of laboratory studies,  ordering and review of radiographic studies, pulse oximetry and re-evaluation of patient's condition.  Final Clinical Impression(s) / ED Diagnoses Final diagnoses:  Trauma  Closed fracture of multiple ribs of right side, initial encounter  Hemopneumothorax on right    Rx / DC Orders ED Discharge Orders    None       Orpah Greek, MD 05/11/21 [phone]

## 2021-05-11 NOTE — Progress Notes (Signed)
RT collected ABG. RN sent results to Dr Barry Dienes.

## 2021-05-11 NOTE — ED Triage Notes (Signed)
[facility] from home where pt fell trying to get out of bed. Pt fell on her RT side and was noted to have flail chest, decreased lung sounds, and had an O2 sat of 52% on RA. Pt did not lose consciousness and was GCS 15 PTA. C-collar in place.

## 2021-05-11 NOTE — ED Notes (Signed)
Fluids placed in pressure bag due to BP 85/50

## 2021-05-11 NOTE — Progress Notes (Addendum)
Patient ID: Melanie [name], female   DOB: 1941-07-17, 80 y.o.   MRN: [id number] Follow up - Trauma Critical Care  Patient Details:    Melanie [name] is an 80 y.o. female.  Lines/tubes : Airway 7 mm (Active)  Secured at (cm) 23 cm 05/11/21 0716  Measured From Lips 05/11/21 [place] 05/11/21 0716  Secured By [employer] 05/11/21 0716  Tube Holder Repositioned Yes 05/11/21 0716  Prone position No 05/11/21 0256  Cuff Pressure (cm H2O) Clear OR 27-39 CmH2O 05/11/21 0716  Site Condition Dry 05/11/21 0716     Chest Tube 1 Right Mediastinal 14 Fr. (Active)  Status -40 cm H2O 05/11/21 0800  Chest Tube Air Leak None 05/11/21 0800  Patency Intervention Tip/tilt 05/11/21 0300  Drainage Description Dark red 05/11/21 0800  Dressing Status Clean;Dry;Intact 05/11/21 0800  Surrounding Skin Unable to view 05/11/21 0800  Output (mL) 0 mL 05/11/21 0052     NG/OG Tube Orogastric 16 Fr. Center mouth Aucultation (Active)  External Length of Tube (cm) - (if applicable) 51 cm 38/75/64 0501  Site Assessment Clean;Intact;Dry 05/11/21 0800  Ongoing Placement Verification No change in respiratory status;No acute changes, not attributed to clinical condition 05/11/21 0800  Status Suction-low intermittent 05/11/21 0800  Amount of suction 40 mmHg 05/11/21 0050  Drainage Appearance Yellow 05/11/21 0800  Output (mL) 0 mL 05/11/21 0050     Urethral Catheter [place] Straight-tip 14 Fr. (Active)  Indication for Insertion or Continuance of Catheter Unstable critically ill patients first 24-48 hours (See Criteria) 05/11/21 0800  Site Assessment Clean;Intact 05/11/21 0800  Catheter Maintenance Bag below level of bladder;Catheter secured;Drainage bag/tubing not touching floor;Insertion date on drainage bag;No dependent loops;Seal intact 05/11/21 0800  Collection Container Standard drainage bag 05/11/21 0800  Securement Method Securing device (Describe) 05/11/21 0800  Urinary  Catheter Interventions (if applicable) Unclamped 33/29/51 0800  Output (mL) 300 mL 05/11/21 0600    Microbiology/Sepsis markers: Results for orders placed or performed during the hospital encounter of 05/11/21  Resp Panel by RT-PCR (Flu A&B, Covid) Nasopharyngeal Swab     Status: None   Collection Time: 05/11/21 12:35 AM   Specimen: Nasopharyngeal Swab; Nasopharyngeal(NP) swabs in vial transport medium  Result Value Ref Range Status   SARS Coronavirus 2 by RT PCR NEGATIVE NEGATIVE Final    Comment: (NOTE) SARS-CoV-2 target nucleic acids are NOT DETECTED.  The SARS-CoV-2 RNA is generally detectable in upper respiratory specimens during the acute phase of infection. The lowest concentration of SARS-CoV-2 viral copies this assay can detect is 138 copies/mL. A negative result does not preclude SARS-Cov-2 infection and should not be used as the sole basis for treatment or other patient management decisions. A negative result may occur with  improper specimen collection/handling, submission of specimen other than nasopharyngeal swab, presence of viral mutation(s) within the areas targeted by this assay, and inadequate number of viral copies(<138 copies/mL). A negative result must be combined with clinical observations, patient history, and epidemiological information. The expected result is Negative.  Fact Sheet for Patients:  EntrepreneurPulse.com.au  Fact Sheet for Healthcare Providers:  IncredibleEmployment.be  This test is no t yet approved or cleared by the [place] FDA and  has been authorized for detection and/or diagnosis of SARS-CoV-2 by FDA under an Emergency Use Authorization (EUA). This EUA will remain  in effect (meaning this test can be used) for the duration of the COVID-19 declaration under Section 564(b)(1) of the Act, 21 U.S.C.section 360bbb-3(b)(1), unless the  authorization is terminated  or revoked sooner.        Influenza A by PCR NEGATIVE NEGATIVE Final   Influenza B by PCR NEGATIVE NEGATIVE Final    Comment: (NOTE) The Xpert Xpress SARS-CoV-2/FLU/RSV plus assay is intended as an aid in the diagnosis of influenza from Nasopharyngeal swab specimens and should not be used as a sole basis for treatment. Nasal washings and aspirates are unacceptable for Xpert Xpress SARS-CoV-2/FLU/RSV testing.  Fact Sheet for Patients: EntrepreneurPulse.com.au  Fact Sheet for Healthcare Providers: IncredibleEmployment.be  This test is not yet approved or cleared by the [place] FDA and has been authorized for detection and/or diagnosis of SARS-CoV-2 by FDA under an Emergency Use Authorization (EUA). This EUA will remain in effect (meaning this test can be used) for the duration of the COVID-19 declaration under Section 564(b)(1) of the Act, 21 U.S.C. section 360bbb-3(b)(1), unless the authorization is terminated or revoked.  Performed at [facility], [place] [address] [place], [place] [address]   MRSA PCR Screening     Status: None   Collection Time: 05/11/21  3:10 AM   Specimen: Nasopharyngeal  Result Value Ref Range Status   MRSA by PCR NEGATIVE NEGATIVE Final    Comment:        The GeneXpert MRSA Assay (FDA approved for NASAL specimens only), is one component of a comprehensive MRSA colonization surveillance program. It is not intended to diagnose MRSA infection nor to guide or monitor treatment for MRSA infections. Performed at [facility], [place] [address] [place], [place] [address]     Anti-infectives:  Anti-infectives (From admission, onward)   None      Best Practice/Protocols:  VTE Prophylaxis: Lovenox (prophylaxtic dose) Continous Sedation  Consults:     Studies:    Events:  Subjective:    Overnight Issues:   Objective:  Vital signs for last 24 hours: Temp:  [98 F (36.7 C)-99.2 F (37.3 C)] 99.2 F (37.3 C)  (05/17 0800) Pulse Rate:  [65-118] 71 (05/17 0800) Resp:  [16-35] 26 (05/17 0800) BP: (79-220)/(39-89) 116/51 (05/17 0800) SpO2:  [95 %-100 %] 100 % (05/17 0800) FiO2 (%):  [40 %-100 %] 40 % (05/17 0716) Weight:  [61.2 kg] 61.2 kg (05/17 0036)  Hemodynamic parameters for last 24 hours:    Intake/Output from previous day: 05/16 0701 - 05/17 0700 In: 1309.5 [I.V.:1309.5] Out: 325 [Urine:300]  Intake/Output this shift: Total I/O In: 83.6 [I.V.:83.6] Out: -   Vent settings for last 24 hours: Vent Mode: PRVC FiO2 (%):  [40 %-100 %] 40 % Set Rate:  [20 bmp-26 bmp] 26 bmp Vt Set:  [380 mL-450 mL] 450 mL PEEP:  [5 cmH20] 5 cmH20  Physical Exam:  General: on vent Neuro: awake on vent, F/C HEENT/Neck: ETT Resp: clear to auscultation bilaterally CVS: RRR GI: soft, nontender, BS WNL, no r/g Extremities: no edema, no erythema, pulses WNL  Results for orders placed or performed during the hospital encounter of 05/11/21 (from the past 24 hour(s))  Resp Panel by RT-PCR (Flu A&B, Covid) Nasopharyngeal Swab     Status: None   Collection Time: 05/11/21 12:35 AM   Specimen: Nasopharyngeal Swab; Nasopharyngeal(NP) swabs in vial transport medium  Result Value Ref Range   SARS Coronavirus 2 by RT PCR NEGATIVE NEGATIVE   Influenza A by PCR NEGATIVE NEGATIVE   Influenza B by PCR NEGATIVE NEGATIVE  Comprehensive metabolic panel     Status: Abnormal   Collection Time: 05/11/21 12:35 AM  Result Value Ref Range   Sodium 138 135 - 145 mmol/L   Potassium 3.6 3.5 - 5.1 mmol/L   Chloride 102 98 - 111 mmol/L   CO2 29 22 - 32 mmol/L   Glucose, Bld 144 (H) 70 - 99 mg/dL   BUN 17 8 - 23 mg/dL   Creatinine, Ser 0.89 0.44 - 1.00 mg/dL   Calcium 8.8 (L) 8.9 - 10.3 mg/dL   Total Protein 6.7 6.5 - 8.1 g/dL   Albumin 3.6 3.5 - 5.0 g/dL   AST 25 15 - 41 U/L   ALT 13 0 - 44 U/L   Alkaline Phosphatase 54 38 - 126 U/L   Total Bilirubin 0.5 0.3 - 1.2 mg/dL   GFR, Estimated >60 >60 mL/min   Anion gap 7  5 - 15  CBC     Status: None   Collection Time: 05/11/21 12:35 AM  Result Value Ref Range   WBC 8.4 4.0 - 10.5 K/uL   RBC 4.37 3.87 - 5.11 MIL/uL   Hemoglobin 13.0 12.0 - 15.0 g/dL   HCT 41.4 36.0 - 46.0 %   MCV 94.7 80.0 - 100.0 fL   MCH 29.7 26.0 - 34.0 pg   MCHC 31.4 30.0 - 36.0 g/dL   RDW 12.5 11.5 - 15.5 %   Platelets 274 150 - 400 K/uL   nRBC 0.0 0.0 - 0.2 %  Ethanol     Status: None   Collection Time: 05/11/21 12:35 AM  Result Value Ref Range   Alcohol, Ethyl (B) <10 <10 mg/dL  Lactic acid, plasma     Status: None   Collection Time: 05/11/21 12:35 AM  Result Value Ref Range   Lactic Acid, Venous 1.4 0.5 - 1.9 mmol/L  Protime-INR     Status: None   Collection Time: 05/11/21 12:35 AM  Result Value Ref Range   Prothrombin Time 13.0 11.4 - 15.2 seconds   INR 1.0 0.8 - 1.2  Sample to Blood Bank     Status: None   Collection Time: 05/11/21 12:35 AM  Result Value Ref Range   Blood Bank Specimen SAMPLE AVAILABLE FOR TESTING    Sample Expiration      05/14/2021,2359 Performed at [facility], [address], [place], [place] [address]   I-Stat Chem 8, ED     Status: Abnormal   Collection Time: 05/11/21 12:44 AM  Result Value Ref Range   Sodium 139 135 - 145 mmol/L   Potassium 3.6 3.5 - 5.1 mmol/L   Chloride 101 98 - 111 mmol/L   BUN 21 8 - 23 mg/dL   Creatinine, Ser 0.80 0.44 - 1.00 mg/dL   Glucose, Bld 137 (H) 70 - 99 mg/dL   Calcium, Ion 1.19 1.15 - 1.40 mmol/L   TCO2 30 22 - 32 mmol/L   Hemoglobin 13.3 12.0 - 15.0 g/dL   HCT 39.0 36.0 - 46.0 %  MRSA PCR Screening     Status: None   Collection Time: 05/11/21  3:10 AM   Specimen: Nasopharyngeal  Result Value Ref Range   MRSA by PCR NEGATIVE NEGATIVE  I-STAT 7, (LYTES, BLD GAS, ICA, H+H)     Status: Abnormal   Collection Time: 05/11/21  3:23 AM  Result Value Ref Range   pH, Arterial 7.216 (L) 7.350 - 7.450   pCO2 arterial 77.8 (HH) 32.0 - 48.0 mmHg   pO2, Arterial 410 (H) 83.0 - 108.0 mmHg   Bicarbonate  31.4 (H) 20.0 - 28.0 mmol/L  TCO2 34 (H) 22 - 32 mmol/L   O2 Saturation 100.0 %   Acid-Base Excess 2.0 0.0 - 2.0 mmol/L   Sodium 138 135 - 145 mmol/L   Potassium 4.5 3.5 - 5.1 mmol/L   Calcium, Ion 1.25 1.15 - 1.40 mmol/L   HCT 36.0 36.0 - 46.0 %   Hemoglobin 12.2 12.0 - 15.0 g/dL   Patient temperature 99.1 F    Sample type ARTERIAL    Comment NOTIFIED PHYSICIAN   Urinalysis, Routine w reflex microscopic Urine, Catheterized     Status: Abnormal   Collection Time: 05/11/21  5:26 AM  Result Value Ref Range   Color, Urine YELLOW YELLOW   APPearance CLEAR CLEAR   Specific Gravity, Urine 1.038 (H) 1.005 - 1.030   pH 5.0 5.0 - 8.0   Glucose, UA NEGATIVE NEGATIVE mg/dL   Hgb urine dipstick NEGATIVE NEGATIVE   Bilirubin Urine NEGATIVE NEGATIVE   Ketones, ur NEGATIVE NEGATIVE mg/dL   Protein, ur NEGATIVE NEGATIVE mg/dL   Nitrite NEGATIVE NEGATIVE   Leukocytes,Ua NEGATIVE NEGATIVE  Urine rapid drug screen (hosp performed)     Status: Abnormal   Collection Time: 05/11/21  5:26 AM  Result Value Ref Range   Opiates NONE DETECTED NONE DETECTED   Cocaine NONE DETECTED NONE DETECTED   Benzodiazepines POSITIVE (A) NONE DETECTED   Amphetamines NONE DETECTED NONE DETECTED   Tetrahydrocannabinol NONE DETECTED NONE DETECTED   Barbiturates NONE DETECTED NONE DETECTED  CBC     Status: None   Collection Time: 05/11/21  5:53 AM  Result Value Ref Range   WBC 10.5 4.0 - 10.5 K/uL   RBC 3.99 3.87 - 5.11 MIL/uL   Hemoglobin 12.0 12.0 - 15.0 g/dL   HCT 37.8 36.0 - 46.0 %   MCV 94.7 80.0 - 100.0 fL   MCH 30.1 26.0 - 34.0 pg   MCHC 31.7 30.0 - 36.0 g/dL   RDW 12.5 11.5 - 15.5 %   Platelets 205 150 - 400 K/uL   nRBC 0.0 0.0 - 0.2 %  Basic metabolic panel     Status: Abnormal   Collection Time: 05/11/21  5:53 AM  Result Value Ref Range   Sodium 138 135 - 145 mmol/L   Potassium 4.5 3.5 - 5.1 mmol/L   Chloride 103 98 - 111 mmol/L   CO2 29 22 - 32 mmol/L   Glucose, Bld 110 (H) 70 - 99 mg/dL   BUN  17 8 - 23 mg/dL   Creatinine, Ser 0.97 0.44 - 1.00 mg/dL   Calcium 8.8 (L) 8.9 - 10.3 mg/dL   GFR, Estimated 59 (L) >60 mL/min   Anion gap 6 5 - 15    Assessment & Plan: Present on Admission: . Closed fracture of multiple ribs with flail chest    LOS: 0 days   Additional comments:I reviewed the patient's new clinical lab test results. and CXR GLF Segmental R rib FX 6-10 with PTX and traumatic pneumatoceles - has air leak and small PTX, increase chest tube to -40, CXR in AM Acute hypercarbic ventilator dependent respiratory failure - last ABG PaCO2  Improved to 59, HX COPD, begin weaning COPD - add duoneb PRN HTN - hold home losartan as BP low FEN - no TF yet as may extubate VTE - Lovenox Dispo - ICU, vent I met with her daughter at the bedside for a clinical update. Tatumn lives alone but has several children locally. Critical Care Total Time*: 35 Minutes  Georganna Skeans, MD, MPH, FACS  Trauma & General Surgery Use [email or web address] to contact on call provider  05/11/2021  *Care during the described time interval was provided by me. I have reviewed this patient's available data, including medical history, events of note, physical examination and test results as part of my evaluation.

## 2021-05-11 NOTE — ED Notes (Signed)
EDP notified of pts current systolic BP being 597C. EDP also notified of propofol current dose of 37mcg/kg/min

## 2021-05-11 NOTE — H&P (Addendum)
History   Melanie [name] is an 80 y.o. female.   Chief Complaint:  Chief Complaint  Patient presents with  . Trauma    Pt is a 80 yo F who fell out of bed onto her right side.  She was brought to the [facility] ED as a level 1 trauma due to right chest wall crepitus, hypoxia, and hypertension. She had labored breathing and was examined quickly and intubated.  She denied headache, nausea, vomiting, or abdominal pain.  She denies any sick contacts.  She hasn't been dizzy.  She doesn't know what precipitated the fall out of bed.     Past Medical History:  Diagnosis Date  . COPD (chronic obstructive pulmonary disease) ([place])     History reviewed. No pertinent surgical history.  History reviewed. No pertinent family history. Social History:  has no history on file for tobacco use, alcohol use, and drug use.  Allergies  No Known Allergies  Home Medications  (Not in a hospital admission)   Trauma Course   Results for orders placed or performed during the hospital encounter of 05/11/21 (from the past 48 hour(s))  Resp Panel by RT-PCR (Flu A&B, Covid) Nasopharyngeal Swab     Status: None   Collection Time: 05/11/21 12:35 AM   Specimen: Nasopharyngeal Swab; Nasopharyngeal(NP) swabs in vial transport medium  Result Value Ref Range   SARS Coronavirus 2 by RT PCR NEGATIVE NEGATIVE    Comment: (NOTE) SARS-CoV-2 target nucleic acids are NOT DETECTED.  The SARS-CoV-2 RNA is generally detectable in upper respiratory specimens during the acute phase of infection. The lowest concentration of SARS-CoV-2 viral copies this assay can detect is 138 copies/mL. A negative result does not preclude SARS-Cov-2 infection and should not be used as the sole basis for treatment or other patient management decisions. A negative result may occur with  improper specimen collection/handling, submission of specimen other than nasopharyngeal swab, presence of viral mutation(s) within the areas targeted by this  assay, and inadequate number of viral copies(<138 copies/mL). A negative result must be combined with clinical observations, patient history, and epidemiological information. The expected result is Negative.  Fact Sheet for Patients:  EntrepreneurPulse.com.au  Fact Sheet for Healthcare Providers:  IncredibleEmployment.be  This test is no t yet approved or cleared by the [place] FDA and  has been authorized for detection and/or diagnosis of SARS-CoV-2 by FDA under an Emergency Use Authorization (EUA). This EUA will remain  in effect (meaning this test can be used) for the duration of the COVID-19 declaration under Section 564(b)(1) of the Act, 21 U.S.C.section 360bbb-3(b)(1), unless the authorization is terminated  or revoked sooner.       Influenza A by PCR NEGATIVE NEGATIVE   Influenza B by PCR NEGATIVE NEGATIVE    Comment: (NOTE) The Xpert Xpress SARS-CoV-2/FLU/RSV plus assay is intended as an aid in the diagnosis of influenza from Nasopharyngeal swab specimens and should not be used as a sole basis for treatment. Nasal washings and aspirates are unacceptable for Xpert Xpress SARS-CoV-2/FLU/RSV testing.  Fact Sheet for Patients: EntrepreneurPulse.com.au  Fact Sheet for Healthcare Providers: IncredibleEmployment.be  This test is not yet approved or cleared by the [place] FDA and has been authorized for detection and/or diagnosis of SARS-CoV-2 by FDA under an Emergency Use Authorization (EUA). This EUA will remain in effect (meaning this test can be used) for the duration of the COVID-19 declaration under Section 564(b)(1) of the Act, 21 U.S.C. section 360bbb-3(b)(1), unless the authorization is terminated or  revoked.  Performed at [facility], [place] [address], [place], [place] [address]   Comprehensive metabolic panel     Status: Abnormal   Collection Time: 05/11/21 12:35 AM   Result Value Ref Range   Sodium 138 135 - 145 mmol/L   Potassium 3.6 3.5 - 5.1 mmol/L   Chloride 102 98 - 111 mmol/L   CO2 29 22 - 32 mmol/L   Glucose, Bld 144 (H) 70 - 99 mg/dL    Comment: Glucose reference range applies only to samples taken after fasting for at least 8 hours.   BUN 17 8 - 23 mg/dL   Creatinine, Ser 0.89 0.44 - 1.00 mg/dL   Calcium 8.8 (L) 8.9 - 10.3 mg/dL   Total Protein 6.7 6.5 - 8.1 g/dL   Albumin 3.6 3.5 - 5.0 g/dL   AST 25 15 - 41 U/L   ALT 13 0 - 44 U/L   Alkaline Phosphatase 54 38 - 126 U/L   Total Bilirubin 0.5 0.3 - 1.2 mg/dL   GFR, Estimated >60 >60 mL/min    Comment: (NOTE) Calculated using the CKD-EPI Creatinine Equation (2021)    Anion gap 7 5 - 15    Comment: Performed at [facility] [address] [place], [place] [address]  CBC     Status: None   Collection Time: 05/11/21 12:35 AM  Result Value Ref Range   WBC 8.4 4.0 - 10.5 K/uL   RBC 4.37 3.87 - 5.11 MIL/uL   Hemoglobin 13.0 12.0 - 15.0 g/dL   HCT 41.4 36.0 - 46.0 %   MCV 94.7 80.0 - 100.0 fL   MCH 29.7 26.0 - 34.0 pg   MCHC 31.4 30.0 - 36.0 g/dL   RDW 12.5 11.5 - 15.5 %   Platelets 274 150 - 400 K/uL   nRBC 0.0 0.0 - 0.2 %    Comment: Performed at [facility], [place] [address] [place], [place] [address]  Ethanol     Status: None   Collection Time: 05/11/21 12:35 AM  Result Value Ref Range   Alcohol, Ethyl (B) <10 <10 mg/dL    Comment: (NOTE) Lowest detectable limit for serum alcohol is 10 mg/dL.  For medical purposes only. Performed at [facility], [place] [address], [place], [place] [address]   Lactic acid, plasma     Status: None   Collection Time: 05/11/21 12:35 AM  Result Value Ref Range   Lactic Acid, Venous 1.4 0.5 - 1.9 mmol/L    Comment: Performed at [facility] [address], [place], [place] [address]  Protime-INR     Status: None   Collection Time: 05/11/21 12:35 AM  Result Value Ref Range   Prothrombin Time 13.0 11.4 - 15.2 seconds    INR 1.0 0.8 - 1.2    Comment: (NOTE) INR goal varies based on device and disease states. Performed at [facility], [place] [address], [place], [place] [address]   Sample to Blood Bank     Status: None   Collection Time: 05/11/21 12:35 AM  Result Value Ref Range   Blood Bank Specimen SAMPLE AVAILABLE FOR TESTING    Sample Expiration      05/14/2021,2359 Performed at [facility], [place] [address] [place], [place] [address]   I-Stat Chem 8, ED     Status: Abnormal   Collection Time: 05/11/21 12:44 AM  Result Value Ref Range   Sodium 139 135 -  145 mmol/L   Potassium 3.6 3.5 - 5.1 mmol/L   Chloride 101 98 - 111 mmol/L   BUN 21 8 - 23 mg/dL   Creatinine, Ser 0.80 0.44 - 1.00 mg/dL   Glucose, Bld 137 (H) 70 - 99 mg/dL    Comment: Glucose reference range applies only to samples taken after fasting for at least 8 hours.   Calcium, Ion 1.19 1.15 - 1.40 mmol/L   TCO2 30 22 - 32 mmol/L   Hemoglobin 13.3 12.0 - 15.0 g/dL   HCT 39.0 36.0 - 46.0 %   CT Head Wo Contrast  Result Date: 05/11/2021 CLINICAL DATA:  Fall, head injury, neck pain, right chest pain, flail chest, hypoxia abdominal injury EXAM: CT HEAD WITHOUT CONTRAST CT CERVICAL SPINE WITHOUT CONTRAST CT CHEST, ABDOMEN AND PELVIS WITH CONTRAST TECHNIQUE: Contiguous axial images were obtained from the base of the skull through the vertex without intravenous contrast. Multidetector CT imaging of the cervical spine was performed without intravenous contrast. Multiplanar CT image reconstructions were also generated. Multidetector CT imaging of the chest, abdomen and pelvis was performed following the standard protocol during bolus administration of intravenous contrast. CONTRAST:  54mL OMNIPAQUE IOHEXOL 300 MG/ML  SOLN COMPARISON:  None. FINDINGS: CT HEAD FINDINGS Brain: Normal anatomic configuration. Parenchymal volume loss is commensurate with the patient's age. Moderate periventricular white matter changes are present likely  reflecting the sequela of small vessel ischemia. No abnormal intra or extra-axial mass lesion or fluid collection. No abnormal mass effect or midline shift. No evidence of acute intracranial hemorrhage or infarct. Ventricular size is normal. Cerebellum unremarkable. Vascular: No asymmetric hyperdense vasculature at the skull base. Skull: Intact Sinuses/Orbits: Paranasal sinuses are clear. Orbits are unremarkable. Other: Mastoid air cells and middle ear cavities are clear. CT CERVICAL FINDINGS Alignment: Normal cervical lordosis.  No listhesis. Skull base and vertebrae: The craniocervical junction is unremarkable. The atlantodental interval is not widened. 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 No acute fracture of the cervical spine. Soft tissues and spinal canal: There is extensive subcutaneous gas within the a soft tissues of the right neck. No prevertebral soft tissue swelling. No paraspinal fluid collections. No canal hematoma. The spinal canal is widely patent. Disc levels: There is intervertebral disc space narrowing and endplate remodeling at 075-GRM and C6-7 in keeping with changes of moderate to severe degenerative disc disease. 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 Remaining intervertebral disc spaces are preserved. Vertebral body heights are preserved. The prevertebral soft tissues are not thickened. The spinal canal is widely patent on sagittal reformats. Axial images demonstrates multilevel uncovertebral and facet arthrosis resulting in moderate neuroforaminal narrowing on the left at C3-4 and C4-5. Other:  Thyroid goiter noted. CT CHEST FINDINGS Cardiovascular: No significant coronary artery calcification. Global cardiac size within normal limits. No pericardial effusion. Central pulmonary arteries are mildly enlarged in keeping with changes of pulmonary arterial hypertension. Moderate atherosclerotic calcification is seen within the thoracic aorta. No  aortic aneurysm. Mediastinum/Nodes: Endotracheal tube seen 4.6 cm above the carina. Nasogastric tube extends into the distal body of the stomach. The esophagus is otherwise unremarkable. No pneumomediastinum. No mediastinal hematoma. No pathologic thoracic adenopathy. Multinodular thyroid goiter noted with nodules measuring up to 9 mm. Lungs/Pleura: Moderate right pneumothorax is present. No mediastinal shift to suggest tension physiology. Right small bore chest tube is in place which initially tracks within the minor fissure, but centrally appears to penetrate the right upper lobe  pulmonary parenchyma. There is focal consolidation within the a dependent right lower lobe. Multiple small posttraumatic pneumatocele is a are seen within the lateral segment of the right lower lobe, best seen on image # 110. Small right mixed attenuation pleural effusion is present in keeping with a hemothorax. Mild centrilobular emphysema. Left lung is clear. No pneumothorax or pleural effusion on the left. Musculoskeletal: There are displaced fractures of the right sixth, seventh, eighth, ninth, and tenth ribs in 2 places resulting in a free-floating segment of the posterior thoracic cage. Significantly displaced right eighth rib fracture fragment appears adjacent to the multiple traumatic pneumatocele seen within the right lower lobe. There is extensive subcutaneous gas within the right chest wall. CT ABDOMEN PELVIS FINDINGS Hepatobiliary: No focal liver abnormality is seen. No gallstones, gallbladder wall thickening, or biliary dilatation. Pancreas: Unremarkable Spleen: Unremarkable Adrenals/Urinary Tract: The adrenal glands are unremarkable. The kidneys are normal in size and position. Multiple simple cortical cysts are seen within the right kidney. The kidneys are otherwise unremarkable. The bladder is unremarkable save for mild pelvic descent with a resultant cystocele. Stomach/Bowel: Surgical changes of sigmoid colectomy, partial  distal small-bowel resection, and appendectomy are identified. The residual stomach, small bowel, and large bowel are otherwise unremarkable Vascular/Lymphatic: Moderate aortoiliac atherosclerotic calcification. No aortic aneurysm. Retroaortic left renal vein. No pathologic adenopathy within the abdomen and pelvis. Reproductive: Status post hysterectomy. No adnexal masses. Other: Pelvic descent noted.  No abdominal wall hernia. Musculoskeletal: Advanced degenerative changes are seen at the lumbosacral junction with grade 1 anterolisthesis of a L4 upon L5 and mild retrolisthesis of L5 upon S1. There are chronic appearing erosive changes involving the L5 vertebral body with approximately 50% loss of height. No acute bone abnormality within the abdomen and pelvis. IMPRESSION: No acute intracranial injury.  No calvarial fracture. No acute fracture or listhesis of the cervical spine. Right flail chest with fractures of the right 6-10 ribs in multiple locations resulting in a free-floating posterior thoracic cage segment. Displaced eighth rib fragment appears in close proximity to multiple traumatic pneumatocele is a within the right lower lobe. Moderate right hemo pneumothorax. Right chest tube is in place but may penetrate the right upper lobe pulmonary parenchyma centrally. Right lower lobe posterior basal consolidation, likely posttraumatic in nature. Mild centrilobular emphysema. Morphologic changes in keeping with pulmonary arterial hypertension. No acute intra-abdominal injury. These results were called by telephone at the time of interpretation on 05/11/2021 at 2:06 am to provider [facility] , who verbally acknowledged these results. Aortic Atherosclerosis (ICD10-I70.0) and Emphysema (ICD10-J43.9). Electronically Signed   By: Fidela Salisbury MD   On: 05/11/2021 02:11   CT Cervical Spine Wo Contrast  Result Date: 05/11/2021 CLINICAL DATA:  Fall, head injury, neck pain, right chest pain, flail chest,  hypoxia abdominal injury EXAM: CT HEAD WITHOUT CONTRAST CT CERVICAL SPINE WITHOUT CONTRAST CT CHEST, ABDOMEN AND PELVIS WITH CONTRAST TECHNIQUE: Contiguous axial images were obtained from the base of the skull through the vertex without intravenous contrast. Multidetector CT imaging of the cervical spine was performed without intravenous contrast. Multiplanar CT image reconstructions were also generated. Multidetector CT imaging of the chest, abdomen and pelvis was performed following the standard protocol during bolus administration of intravenous contrast. CONTRAST:  20mL OMNIPAQUE IOHEXOL 300 MG/ML  SOLN COMPARISON:  None. FINDINGS: CT HEAD FINDINGS Brain: Normal anatomic configuration. Parenchymal volume loss is commensurate with the patient's age. Moderate periventricular white matter changes are present likely reflecting the sequela of small vessel ischemia. No abnormal  intra or extra-axial mass lesion or fluid collection. No abnormal mass effect or midline shift. No evidence of acute intracranial hemorrhage or infarct. Ventricular size is normal. Cerebellum unremarkable. Vascular: No asymmetric hyperdense vasculature at the skull base. Skull: Intact Sinuses/Orbits: Paranasal sinuses are clear. Orbits are unremarkable. Other: Mastoid air cells and middle ear cavities are clear. CT CERVICAL FINDINGS Alignment: Normal cervical lordosis.  No listhesis. Skull base and vertebrae: The craniocervical junction is unremarkable. The atlantodental interval is not widened. 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 No acute fracture of the cervical spine. Soft tissues and spinal canal: There is extensive subcutaneous gas within the a soft tissues of the right neck. No prevertebral soft tissue swelling. No paraspinal fluid collections. No canal hematoma. The spinal canal is widely patent. Disc levels: There is intervertebral disc space narrowing and endplate remodeling at 075-GRM and C6-7 in keeping  with changes of moderate to severe degenerative disc disease. 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 Remaining intervertebral disc spaces are preserved. Vertebral body heights are preserved. The prevertebral soft tissues are not thickened. The spinal canal is widely patent on sagittal reformats. Axial images demonstrates multilevel uncovertebral and facet arthrosis resulting in moderate neuroforaminal narrowing on the left at C3-4 and C4-5. Other:  Thyroid goiter noted. CT CHEST FINDINGS Cardiovascular: No significant coronary artery calcification. Global cardiac size within normal limits. No pericardial effusion. Central pulmonary arteries are mildly enlarged in keeping with changes of pulmonary arterial hypertension. Moderate atherosclerotic calcification is seen within the thoracic aorta. No aortic aneurysm. Mediastinum/Nodes: Endotracheal tube seen 4.6 cm above the carina. Nasogastric tube extends into the distal body of the stomach. The esophagus is otherwise unremarkable. No pneumomediastinum. No mediastinal hematoma. No pathologic thoracic adenopathy. Multinodular thyroid goiter noted with nodules measuring up to 9 mm. Lungs/Pleura: Moderate right pneumothorax is present. No mediastinal shift to suggest tension physiology. Right small bore chest tube is in place which initially tracks within the minor fissure, but centrally appears to penetrate the right upper lobe pulmonary parenchyma. There is focal consolidation within the a dependent right lower lobe. Multiple small posttraumatic pneumatocele is a are seen within the lateral segment of the right lower lobe, best seen on image # 110. Small right mixed attenuation pleural effusion is present in keeping with a hemothorax. Mild centrilobular emphysema. Left lung is clear. No pneumothorax or pleural effusion on the left. Musculoskeletal: There are displaced fractures of the right sixth, seventh, eighth, ninth, and tenth ribs in 2 places  resulting in a free-floating segment of the posterior thoracic cage. Significantly displaced right eighth rib fracture fragment appears adjacent to the multiple traumatic pneumatocele seen within the right lower lobe. There is extensive subcutaneous gas within the right chest wall. CT ABDOMEN PELVIS FINDINGS Hepatobiliary: No focal liver abnormality is seen. No gallstones, gallbladder wall thickening, or biliary dilatation. Pancreas: Unremarkable Spleen: Unremarkable Adrenals/Urinary Tract: The adrenal glands are unremarkable. The kidneys are normal in size and position. Multiple simple cortical cysts are seen within the right kidney. The kidneys are otherwise unremarkable. The bladder is unremarkable save for mild pelvic descent with a resultant cystocele. Stomach/Bowel: Surgical changes of sigmoid colectomy, partial distal small-bowel resection, and appendectomy are identified. The residual stomach, small bowel, and large bowel are otherwise unremarkable Vascular/Lymphatic: Moderate aortoiliac atherosclerotic calcification. No aortic aneurysm. Retroaortic left renal vein. No pathologic adenopathy within the abdomen and pelvis. Reproductive: Status post hysterectomy. No adnexal masses. Other: Pelvic descent noted.  No abdominal wall hernia. Musculoskeletal: Advanced degenerative changes are seen at the lumbosacral junction with grade 1 anterolisthesis of a L4 upon L5 and mild retrolisthesis of L5 upon S1. There are chronic appearing erosive changes involving the L5 vertebral body with approximately 50% loss of height. No acute bone abnormality within the abdomen and pelvis. IMPRESSION: No acute intracranial injury.  No calvarial fracture. No acute fracture or listhesis of the cervical spine. Right flail chest with fractures of the right 6-10 ribs in multiple locations resulting in a free-floating posterior thoracic cage segment. Displaced eighth rib fragment appears in close proximity to multiple traumatic  pneumatocele is a within the right lower lobe. Moderate right hemo pneumothorax. Right chest tube is in place but may penetrate the right upper lobe pulmonary parenchyma centrally. Right lower lobe posterior basal consolidation, likely posttraumatic in nature. Mild centrilobular emphysema. Morphologic changes in keeping with pulmonary arterial hypertension. No acute intra-abdominal injury. These results were called by telephone at the time of interpretation on 05/11/2021 at 2:06 am to provider [facility] , who verbally acknowledged these results. Aortic Atherosclerosis (ICD10-I70.0) and Emphysema (ICD10-J43.9). Electronically Signed   By: Fidela Salisbury MD   On: 05/11/2021 02:11   DG Pelvis Portable  Result Date: 05/11/2021 CLINICAL DATA:  Fall, right hip pain EXAM: PORTABLE PELVIS 1-2 VIEWS COMPARISON:  None. FINDINGS: There is no evidence of pelvic fracture or diastasis. No pelvic bone lesions are seen. IMPRESSION: Negative. Electronically Signed   By: Fidela Salisbury MD   On: 05/11/2021 02:11   CT CHEST ABDOMEN PELVIS W CONTRAST  Result Date: 05/11/2021 CLINICAL DATA:  Fall, head injury, neck pain, right chest pain, flail chest, hypoxia abdominal injury EXAM: CT HEAD WITHOUT CONTRAST CT CERVICAL SPINE WITHOUT CONTRAST CT CHEST, ABDOMEN AND PELVIS WITH CONTRAST TECHNIQUE: Contiguous axial images were obtained from the base of the skull through the vertex without intravenous contrast. Multidetector CT imaging of the cervical spine was performed without intravenous contrast. Multiplanar CT image reconstructions were also generated. Multidetector CT imaging of the chest, abdomen and pelvis was performed following the standard protocol during bolus administration of intravenous contrast. CONTRAST:  40mL OMNIPAQUE IOHEXOL 300 MG/ML  SOLN COMPARISON:  None. FINDINGS: CT HEAD FINDINGS Brain: Normal anatomic configuration. Parenchymal volume loss is commensurate with the patient's age. Moderate  periventricular white matter changes are present likely reflecting the sequela of small vessel ischemia. No abnormal intra or extra-axial mass lesion or fluid collection. No abnormal mass effect or midline shift. No evidence of acute intracranial hemorrhage or infarct. Ventricular size is normal. Cerebellum unremarkable. Vascular: No asymmetric hyperdense vasculature at the skull base. Skull: Intact Sinuses/Orbits: Paranasal sinuses are clear. Orbits are unremarkable. Other: Mastoid air cells and middle ear cavities are clear. CT CERVICAL FINDINGS Alignment: Normal cervical lordosis.  No listhesis. Skull base and vertebrae: The craniocervical junction is unremarkable. The atlantodental interval is not widened. 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 No acute fracture of the cervical spine. Soft tissues and spinal canal: There is extensive subcutaneous gas within the a soft tissues of the right neck. No prevertebral soft tissue swelling. No paraspinal fluid collections. No canal hematoma. The spinal canal is widely patent. Disc levels: There is intervertebral disc space narrowing and endplate remodeling at 075-GRM and C6-7 in keeping with changes of moderate to severe degenerative disc disease. 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 Remaining intervertebral disc spaces are preserved. Vertebral body heights are preserved.  The prevertebral soft tissues are not thickened. The spinal canal is widely patent on sagittal reformats. Axial images demonstrates multilevel uncovertebral and facet arthrosis resulting in moderate neuroforaminal narrowing on the left at C3-4 and C4-5. Other:  Thyroid goiter noted. CT CHEST FINDINGS Cardiovascular: No significant coronary artery calcification. Global cardiac size within normal limits. No pericardial effusion. Central pulmonary arteries are mildly enlarged in keeping with changes of pulmonary arterial hypertension. Moderate  atherosclerotic calcification is seen within the thoracic aorta. No aortic aneurysm. Mediastinum/Nodes: Endotracheal tube seen 4.6 cm above the carina. Nasogastric tube extends into the distal body of the stomach. The esophagus is otherwise unremarkable. No pneumomediastinum. No mediastinal hematoma. No pathologic thoracic adenopathy. Multinodular thyroid goiter noted with nodules measuring up to 9 mm. Lungs/Pleura: Moderate right pneumothorax is present. No mediastinal shift to suggest tension physiology. Right small bore chest tube is in place which initially tracks within the minor fissure, but centrally appears to penetrate the right upper lobe pulmonary parenchyma. There is focal consolidation within the a dependent right lower lobe. Multiple small posttraumatic pneumatocele is a are seen within the lateral segment of the right lower lobe, best seen on image # 110. Small right mixed attenuation pleural effusion is present in keeping with a hemothorax. Mild centrilobular emphysema. Left lung is clear. No pneumothorax or pleural effusion on the left. Musculoskeletal: There are displaced fractures of the right sixth, seventh, eighth, ninth, and tenth ribs in 2 places resulting in a free-floating segment of the posterior thoracic cage. Significantly displaced right eighth rib fracture fragment appears adjacent to the multiple traumatic pneumatocele seen within the right lower lobe. There is extensive subcutaneous gas within the right chest wall. CT ABDOMEN PELVIS FINDINGS Hepatobiliary: No focal liver abnormality is seen. No gallstones, gallbladder wall thickening, or biliary dilatation. Pancreas: Unremarkable Spleen: Unremarkable Adrenals/Urinary Tract: The adrenal glands are unremarkable. The kidneys are normal in size and position. Multiple simple cortical cysts are seen within the right kidney. The kidneys are otherwise unremarkable. The bladder is unremarkable save for mild pelvic descent with a resultant  cystocele. Stomach/Bowel: Surgical changes of sigmoid colectomy, partial distal small-bowel resection, and appendectomy are identified. The residual stomach, small bowel, and large bowel are otherwise unremarkable Vascular/Lymphatic: Moderate aortoiliac atherosclerotic calcification. No aortic aneurysm. Retroaortic left renal vein. No pathologic adenopathy within the abdomen and pelvis. Reproductive: Status post hysterectomy. No adnexal masses. Other: Pelvic descent noted.  No abdominal wall hernia. Musculoskeletal: Advanced degenerative changes are seen at the lumbosacral junction with grade 1 anterolisthesis of a L4 upon L5 and mild retrolisthesis of L5 upon S1. There are chronic appearing erosive changes involving the L5 vertebral body with approximately 50% loss of height. No acute bone abnormality within the abdomen and pelvis. IMPRESSION: No acute intracranial injury.  No calvarial fracture. No acute fracture or listhesis of the cervical spine. Right flail chest with fractures of the right 6-10 ribs in multiple locations resulting in a free-floating posterior thoracic cage segment. Displaced eighth rib fragment appears in close proximity to multiple traumatic pneumatocele is a within the right lower lobe. Moderate right hemo pneumothorax. Right chest tube is in place but may penetrate the right upper lobe pulmonary parenchyma centrally. Right lower lobe posterior basal consolidation, likely posttraumatic in nature. Mild centrilobular emphysema. Morphologic changes in keeping with pulmonary arterial hypertension. No acute intra-abdominal injury. These results were called by telephone at the time of interpretation on 05/11/2021 at 2:06 am to provider [facility] , who verbally acknowledged these results. Aortic Atherosclerosis (  ICD10-I70.0) and Emphysema (ICD10-J43.9). Electronically Signed   By: Fidela Salisbury MD   On: 05/11/2021 02:11   DG Chest Portable 1 View  Result Date: 05/11/2021 CLINICAL  DATA:  Trauma EXAM: PORTABLE CHEST 1 VIEW COMPARISON:  05/11/2021 FINDINGS: There is a pigtail catheter in the right chest. Unchanged size of right pneumothorax. Multiple right-sided rib fractures again seen. IMPRESSION: Unchanged size of right pneumothorax status post chest tube placement. Electronically Signed   By: Ulyses Jarred M.D.   On: 05/11/2021 02:37   DG Chest Port 1 View  Result Date: 05/11/2021 CLINICAL DATA:  Level 1 trauma EXAM: PORTABLE CHEST 1 VIEW COMPARISON:  May 21, 2020 FINDINGS: The heart size and mediastinal contours are within normal limits. Multiple displaced right-sided rib fractures including the right sixth, seventh, eighth and ninth ribs. Small small right hydropneumothorax with left basilar airspace opacities. No evidence of tension. Left lung is clear. Right chest wall subcutaneous emphysema. IMPRESSION: 1. Small right hydropneumothorax without evidence of tension. Right basilar airspace opacities. 2. Multiple displaced right-sided rib fractures with associated right basilar airspace opacities. Electronically Signed   By: Dahlia Bailiff MD   On: 05/11/2021 01:07    Review of Systems  Unable to perform ROS: Severe respiratory distress    Blood pressure (!) 147/50, pulse (!) 107, temperature 98 F (36.7 C), temperature source Temporal, resp. rate (!) 28, height 5\' 8"  (1.727 m), weight 61.2 kg, SpO2 100 %. Physical Exam Constitutional:      General: She is in acute distress.     Appearance: Normal appearance. She is obese. She is ill-appearing. She is not diaphoretic.     Comments: Very thin  HENT:     Head: Normocephalic and atraumatic.     Right Ear: External ear normal.     Left Ear: External ear normal.     Nose: No congestion or rhinorrhea.     Mouth/Throat:     Mouth: Mucous membranes are moist.  Eyes:     General: No scleral icterus.       Right eye: No discharge.        Left eye: No discharge.     Extraocular Movements: Extraocular movements intact.      Pupils: Pupils are equal, round, and reactive to light.  Cardiovascular:     Rate and Rhythm: Normal rate and regular rhythm.     Pulses: Normal pulses.     Heart sounds: Normal heart sounds.  Pulmonary:     Effort: Pulmonary effort is normal.     Breath sounds: Normal breath sounds.  Abdominal:     General: Abdomen is flat. Bowel sounds are normal. There is no distension.     Palpations: Abdomen is soft. There is no mass.     Tenderness: There is no abdominal tenderness. There is no right CVA tenderness, left CVA tenderness, guarding or rebound.     Hernia: No hernia is present.  Musculoskeletal:        General: No swelling, tenderness, deformity or signs of injury.     Cervical back: Normal range of motion and neck supple. No rigidity.  Skin:    General: Skin is warm and dry.     Capillary Refill: Capillary refill takes 2 to 3 seconds.     Coloration: Skin is pale. Skin is not jaundiced.     Findings: No bruising or erythema.  Neurological:     General: No focal deficit present.     Mental Status: She is alert  and oriented to person, place, and time. Mental status is at baseline.     Motor: No weakness.  Psychiatric:        Mood and Affect: Mood normal.        Behavior: Behavior normal.        Thought Content: Thought content normal.        Judgment: Judgment normal.     Assessment/Plan Fall out of bed Right hemopneumothorax Pigtail chest tube placed on right.   Intubated for VDRF. Propofol and fentanyl for sedation.    Will work on pain control and initiate weaning as toleraed in the AM.   Will need PT and OT consults.   I am concerned with the flail chest, that she may need significantly more work on walking.     Stark Klein 05/11/2021, 2:44 AM   Procedures

## 2021-05-11 NOTE — TOC CAGE-AID Note (Signed)
Transition of Care [facility]) - CAGE-AID Screening   Patient Details  Name: Melanie [name] MRN: [id number] Date of Birth: 03-Mar-1941   Clinical Narrative:  Pt intubated, unable to answer questions.   CAGE-AID Screening: Substance Abuse Screening unable to be completed due to: : Patient unable to participate (pt is intubated)

## 2021-05-11 NOTE — Progress Notes (Signed)
0500 repeat ABG results not crossing over into EPIC  PO2   7.232 PCO2  59 PO2     80 Bicarb  28  Results given to Dr Barry Dienes

## 2021-05-11 NOTE — ED Notes (Signed)
pts current BP is 110/41 (60). EDP notified. EDP states he will order a fentanyl gtt and titration of the propofol and fentanyl can be initiated. This TRN took pt to CT scan. Trauma provider was present during scan. Pt taken with RRT and multiple NT. Pt back in room. Family at bedside.

## 2021-05-11 NOTE — Progress Notes (Signed)
Pt transported to 4N with TRN. pts cell phone given to family to take home. Pt has glasses and dentures up to 4N20 with pt.

## 2021-05-11 NOTE — Progress Notes (Addendum)
Patient intubated on arrival due to better pain management because of level 1 trauma fall/injury. Patient placed on Vt 380, RR 20, PEEP 5, FIO2 100%.  At this time, diminished to absent lung sounds on the right.  RT will conitinue to monitor the patient. 7.0 ET tube placed and E-tad holder attached.  Tube in center after intubation.

## 2021-05-11 NOTE — ED Notes (Signed)
Delayed CT due to chest tube insertion

## 2021-05-11 NOTE — Procedures (Signed)
Extubation Procedure Note  Patient Details:   Name: Melanie [name] DOB: 27-Nov-1941 MRN: [id number]   Airway Documentation:   Pt extubated per orders, pt had positive cuff leak prior to extubation. Placed on 4lpm humidified oxygen. Pt has strong cough. RN at bedside. Vent end date: 05/11/21 Vent end time: 1428   Evaluation  O2 sats: stable throughout Complications: No apparent complications Patient did tolerate procedure well. Bilateral Breath Sounds: Diminished   Yes  Ander Purpura 05/11/2021, 2:29 PM

## 2021-05-11 NOTE — ED Notes (Signed)
ED Physician will intubate at this time

## 2021-05-11 NOTE — Progress Notes (Signed)
Critical Value:    Ref. Range 05/11/2021 03:23  Sample type Unknown ARTERIAL  pH, Arterial Latest Ref Range: 7.350 - 7.450  7.216 (L)  pCO2 arterial Latest Ref Range: 32.0 - 48.0 mmHg 77.8 (HH)  pO2, Arterial Latest Ref Range: 83.0 - 108.0 mmHg 410 (H)  TCO2 Latest Ref Range: 22 - 32 mmol/L 34 (H)  Acid-Base Excess Latest Ref Range: 0.0 - 2.0 mmol/L 2.0  Bicarbonate Latest Ref Range: 20.0 - 28.0 mmol/L 31.4 (H)   Results given to Dr Barry Dienes. Orders received to increase RR to 26 and TV to 450.

## 2021-05-11 NOTE — Op Note (Signed)
Chest Tube Insertion Procedure Note  Indications:  Clinically significant Pneumothorax  Pre-operative Diagnosis: Pneumothorax  Post-operative Diagnosis: hemopneumothorax  Procedure Details  Pt came in as level 1 trauma in significant respiratory distress with crepitus on the right.  Decreased breath sounds were present.  Patient was informed of need for intubation and chest tube placement and assented to the procedure  After sterile skin prep, using standard technique, a 14 French tube was placed in the right anterolateral 5th rib space.  Findings: Air and blood in tube (<100 mL)  Estimated Blood Loss:  less than 100 mL         Specimens:  None              Complications:  None; patient tolerated the procedure well.         Disposition:  remained in ED for trauma         Condition: stable  Attending Attestation: I performed the procedure.

## 2021-05-12 ENCOUNTER — Inpatient Hospital Stay (HOSPITAL_COMMUNITY): Payer: Medicare Other

## 2021-05-12 LAB — POCT I-STAT 7, (LYTES, BLD GAS, ICA,H+H)
Acid-Base Excess: 0 mmol/L (ref 0.0–2.0)
Acid-Base Excess: 2 mmol/L (ref 0.0–2.0)
Bicarbonate: 28.1 mmol/L — ABNORMAL HIGH (ref 20.0–28.0)
Bicarbonate: 28.1 mmol/L — ABNORMAL HIGH (ref 20.0–28.0)
Calcium, Ion: 1.27 mmol/L (ref 1.15–1.40)
Calcium, Ion: 1.3 mmol/L (ref 1.15–1.40)
HCT: 33 % — ABNORMAL LOW (ref 36.0–46.0)
HCT: 29 % — ABNORMAL LOW (ref 36.0–46.0)
Hemoglobin: 11.2 g/dL — ABNORMAL LOW (ref 12.0–15.0)
Hemoglobin: 9.9 g/dL — ABNORMAL LOW (ref 12.0–15.0)
O2 Saturation: 94 %
O2 Saturation: 94 %
Patient temperature: 98.2
Patient temperature: 98.7
Potassium: 4.7 mmol/L (ref 3.5–5.1)
Potassium: 4.3 mmol/L (ref 3.5–5.1)
Sodium: 139 mmol/L (ref 135–145)
Sodium: 135 mmol/L (ref 135–145)
TCO2: 30 mmol/L (ref 22–32)
TCO2: 30 mmol/L (ref 22–32)
pCO2 arterial: 58.9 mmHg — ABNORMAL HIGH (ref 32.0–48.0)
pCO2 arterial: 53.1 mmHg — ABNORMAL HIGH (ref 32.0–48.0)
pH, Arterial: 7.286 — ABNORMAL LOW (ref 7.350–7.450)
pH, Arterial: 7.333 — ABNORMAL LOW (ref 7.350–7.450)
pO2, Arterial: 81 mmHg — ABNORMAL LOW (ref 83.0–108.0)
pO2, Arterial: 79 mmHg — ABNORMAL LOW (ref 83.0–108.0)

## 2021-05-12 MED ORDER — LOSARTAN POTASSIUM 50 MG PO TABS
100.0000 mg | ORAL_TABLET | Freq: Every day | ORAL | Status: DC
  Administered 2021-05-12 – 2021-05-17 (×6): 100 mg via ORAL
  Filled 2021-05-12 (×6): qty 2

## 2021-05-12 MED ORDER — LORAZEPAM 0.5 MG PO TABS
0.5000 mg | ORAL_TABLET | Freq: Every evening | ORAL | Status: DC | PRN
  Administered 2021-05-13 – 2021-05-16 (×5): 0.5 mg via ORAL
  Filled 2021-05-12 (×5): qty 1

## 2021-05-12 MED ORDER — WHITE PETROLATUM EX OINT
TOPICAL_OINTMENT | CUTANEOUS | Status: AC
  Filled 2021-05-12: qty 28.35

## 2021-05-12 NOTE — Progress Notes (Signed)
   Subjective: Some nausea but wants to try CL ROS negative except as listed above. Objective: Vital signs in last 24 hours: Temp:  [98.3 F (36.8 C)-100 F (37.8 C)] 99.8 F (37.7 C) (05/18 0800) Pulse Rate:  [69-107] 90 (05/18 0800) Resp:  [11-26] 20 (05/18 0800) BP: (84-156)/(41-82) 145/56 (05/18 0800) SpO2:  [95 %-100 %] 98 % (05/18 0800) FiO2 (%):  [40 %] 40 % (05/17 1036) Last BM Date:  (PTA)  Intake/Output from previous day: 05/17 0701 - 05/18 0700 In: 2118.7 [I.V.:1841.1; IV Piggyback:277.5] Out: 935 [Urine:885; Chest Tube:50] Intake/Output this shift: Total I/O In: 75.1 [I.V.:75.1] Out: -   General appearance: cooperative Resp: clear to auscultation bilaterally Cardio: regular rate and rhythm GI: soft, NT Extremities: calves soft Neurologic: Mental status: some confusion but F/C  Chest tube no air leak  Lab Results: CBC  Recent Labs    05/11/21 0035 05/11/21 0044 05/11/21 0553 05/12/21 0513  WBC 8.4  --  10.5  --   HGB 13.0   < > 12.0 9.9*  HCT 41.4   < > 37.8 29.0*  PLT 274  --  205  --    < > = values in this interval not displayed.   BMET Recent Labs    05/11/21 0035 05/11/21 0044 05/11/21 0323 05/11/21 0553 05/12/21 0513  NA 138 139   < > 138 135  K 3.6 3.6   < > 4.5 4.3  CL 102 101  --  103  --   CO2 29  --   --  29  --   GLUCOSE 144* 137*  --  110*  --   BUN 17 21  --  17  --   CREATININE 0.89 0.80  --  0.97  --   CALCIUM 8.8*  --   --  8.8*  --    < > = values in this interval not displayed.   PT/INR Recent Labs    05/11/21 0035  LABPROT 13.0  INR 1.0   ABG Recent Labs    05/11/21 0459 05/12/21 0513  PHART 7.286* 7.333*  HCO3 28.1* 28.1*     Assessment/Plan: GLF Segmental R rib FX 6-10 with PTX and traumatic pneumatoceles - no air leak today, decrease to -20 suction, CXR in AM Acute hypercarbic respiratory failure - tolerated extubation 5/17, pulm toilet COPD - duoneb PRN HTN - restart home losartan FEN -  clears and advance if she tolerates VTE - Lovenox Dispo - to 4NP, PT/OT   LOS: 1 day    Georganna Skeans, MD, MPH, FACS Trauma & General Surgery Use [email or web address] to contact on call provider  5/18/2022Patient ID: Melanie [name], female   DOB: 1941/09/18, 80 y.o.   MRN: [id number]

## 2021-05-12 NOTE — Evaluation (Signed)
Occupational Therapy Evaluation Patient Details Name: NYKIAH MA MRN: [id number] DOB: [birth year] Today's Date: 05/12/2021    History of Present Illness Pt is an 80 y.o. female who presented 5/17 as a level 1 trauma due to right chest wall crepitus, hypoxia, and HTN following falling out of bed onto her R side. Pt found to have R hemopneumothorax and R rib fxs 6-10. R chest tube placed 5/17. ETT 5/17-5/17. PMH: COPD.   Clinical Impression   Pt PTA: Pt living alone and reports independence with ADL and mobility prior. Pt currently, limited due to decreased command follow, nausea, decreased ability to care for self and decreased mobility. Pt set-upA to [place] for ADL. Pt minA +2 for mobility/transfers with HHA. Pt with cognitive deficits noted and slowed processing which can be grouped with HOH. Pt would greatly benefit from continued OT skilled services. OT following acutely.   O2 required 2L  >90%; pt desatting to 87% O2.       Follow Up Recommendations  Home health OT;Supervision/Assistance - 24 hour    Equipment Recommendations  3 in 1 bedside commode;Wheelchair (measurements OT);Wheelchair cushion (measurements OT)    Recommendations for Other Services       Precautions / Restrictions Precautions Precautions: Fall Precaution Comments: R chest tube to wall suction, monitor sats, HOH Restrictions Weight Bearing Restrictions: No      Mobility Bed Mobility Overal bed mobility: Needs Assistance Bed Mobility: Supine to Sit     Supine to sit: HOB elevated;Min guard     General bed mobility comments: Pt able to come to long-sitting in bed and scooting to EOB    Transfers Overall transfer level: Needs assistance Equipment used: 2 person hand held assist Transfers: Sit to/from [facility] Sit to Stand: Min assist;+2 physical assistance;+2 safety/equipment Stand pivot transfers: Min assist;+2 physical assistance;+2 safety/equipment       General  transfer comment: Bil HHA to direct pt, minAx2 to power up. Cues for hand placement; took a few steps to recliner next to bed with hadn held assist. Sit to stand 1x from EOB and 1x from recliner with  minA +2.    Balance Overall balance assessment: Needs assistance Sitting-balance support: No upper extremity supported;Feet supported Sitting balance-Leahy Scale: Fair Sitting balance - Comments: Pt minimally reaching off BOS sitting EOB, min guard.   Standing balance support: Bilateral upper extremity supported;During functional activity Standing balance-Leahy Scale: Poor Standing balance comment: Reliant on bil UE support and external assist.                           ADL either performed or assessed with clinical judgement   ADL Overall ADL's : Needs assistance/impaired Eating/Feeding: Set up;Sitting   Grooming: Minimal assistance;Sitting   Upper Body Bathing: Moderate assistance;Sitting   Lower Body Bathing: Maximal assistance;Sitting/lateral leans;Sit to/from stand;Cueing for safety;Cueing for sequencing   Upper Body Dressing : Moderate assistance;Sitting   Lower Body Dressing: Maximal assistance;Sitting/lateral leans;Sit to/from stand;Cueing for safety   Toilet Transfer: Minimal assistance;+2 for physical assistance;+2 for safety/equipment;Stand-pivot Toilet Transfer Details (indicate cue type and reason): +2 HHA simulated to recliner Toileting- Clothing Manipulation and Hygiene: Total assistance;Sitting/lateral lean;Sit to/from stand Toileting - Clothing Manipulation Details (indicate cue type and reason): pure wick placed     Functional mobility during ADLs: Minimal assistance;+2 for physical assistance;+2 for safety/equipment;Cueing for safety;Cueing for sequencing General ADL Comments: Pt limited due to decreased command follow, nausea, decreased ability to care for self and  decreased mobility. VSS. O2 required 2L [place] >90%; pt desatting to 87% O2.     Vision  Baseline Vision/History: Wears glasses Wears Glasses: At all times Vision Assessment?: Yes Eye Alignment: Within Functional Limits Additional Comments: continue to assess; pt feeling nauseous and unable to stay on task long enough     Perception     Praxis      Pertinent Vitals/Pain Pain Assessment: Faces Faces Pain Scale: Hurts little more Pain Location: R chest Pain Descriptors / Indicators: Discomfort;Grimacing;Guarding Pain Intervention(s): Monitored during session     Hand Dominance Right   Extremity/Trunk Assessment Upper Extremity Assessment Upper Extremity Assessment: Generalized weakness   Lower Extremity Assessment Lower Extremity Assessment: Generalized weakness   Cervical / Trunk Assessment Cervical / Trunk Assessment: Kyphotic   Communication Communication Communication: HOH   Cognition Arousal/Alertness: Awake/alert Behavior During Therapy: Flat affect Overall Cognitive Status: Impaired/Different from baseline Area of Impairment: Attention;Following commands;Safety/judgement;Awareness;Problem solving                   Current Attention Level: Focused   Following Commands: Follows one step commands consistently;Follows one step commands with increased time Safety/Judgement: Decreased awareness of safety;Decreased awareness of deficits Awareness: Emergent Problem Solving: Slow processing;Decreased initiation;Difficulty sequencing;Requires verbal cues;Requires tactile cues General Comments: Pt reports feeling nauseous throughout session. Pt expressing impaired cognition for verbal tasks requiring repeitition. pt following 1 step commands consistently. Pt with noted increased confusion compared to her baseline, per family. pt able to recall day of the week after corrected earlier in the session.   General Comments  requiring 2LO2 desat to 80s on RA.    Exercises     Shoulder Instructions      Home Living Family/patient expects to be discharged  to:: Private residence Living Arrangements: Alone Available Help at Discharge: Family;Available 24 hours/day Type of Home: House Home Access: Stairs to enter [employer] of Steps: 10   Home Layout: One level;Able to live on main level with bedroom/bathroom;Laundry or work area in basement     [employer] Shower/Tub: Teacher, early years/pre: Standard     Home Equipment: None   Additional Comments: family reports "we can try for 24/7"      Prior Functioning/Environment Level of Independence: Independent        Comments: reports independence; driving        OT Problem List: Decreased strength;Decreased activity tolerance;Impaired balance (sitting and/or standing);Decreased cognition;Impaired vision/perception;Pain;Cardiopulmonary status limiting activity;Decreased safety awareness      OT Treatment/Interventions: Self-care/ADL training;Therapeutic exercise;Neuromuscular education;Energy conservation;Therapeutic activities;Cognitive remediation/compensation;Balance training;Visual/perceptual remediation/compensation;Patient/family education    OT Goals(Current goals can be found in the care plan section) Acute Rehab OT Goals Patient Stated Goal: family desires pt to improve OT Goal Formulation: With patient Time For Goal Achievement: 05/26/21 Potential to Achieve Goals: Good ADL Goals Pt Will Perform Lower Body Dressing: with supervision;sit to/from stand Pt Will Transfer to Toilet: with min guard assist;ambulating;bedside commode Pt Will Perform Toileting - Clothing Manipulation and hygiene: with min guard assist;sitting/lateral leans;sit to/from stand Additional ADL Goal #1: Pt will complete (3) multi-step commands with minimal cues to attend to task.  OT Frequency: Min 2X/week   Barriers to D/C:            Co-evaluation PT/OT/SLP Co-Evaluation/Treatment: Yes Reason for Co-Treatment: Complexity of the patient's impairments (multi-system  involvement);To address functional/ADL transfers PT goals addressed during session: Mobility/safety with mobility;Balance OT goals addressed during session: ADL's and self-care;Strengthening/ROM      AM-PAC OT "6 Clicks"  Daily Activity     Outcome Measure Help from another person eating meals?: A Lot Help from another person taking care of personal grooming?: A Lot Help from another person toileting, which includes using toliet, bedpan, or urinal?: Total Help from another person bathing (including washing, rinsing, drying)?: A Lot Help from another person to put on and taking off regular upper body clothing?: A Little Help from another person to put on and taking off regular lower body clothing?: A Lot 6 Click Score: 12   End of Session Equipment Utilized During Treatment: Gait belt;Oxygen Nurse Communication: Mobility status  Activity Tolerance: Patient limited by fatigue;Patient limited by pain Patient left: in chair;with call bell/phone within reach;with chair alarm set;with family/visitor present                   Time: 1341-1418 OT Time Calculation (min): 37 min Charges:  OT General Charges $OT Visit: 1 Visit OT Evaluation $OT Eval Moderate Complexity: 1 Mod  Jefferey Pica, OTR/L Acute Rehabilitation Services Pager: [phone] Office: [phone]   Rola Lennon C 05/12/2021, 5:26 PM

## 2021-05-12 NOTE — Evaluation (Signed)
Physical Therapy Evaluation Patient Details Name: Melanie [name] MRN: [id number] DOB: 01-20-1941 Today's Date: 05/12/2021   History of Present Illness  Pt is an 80 y.o. female who presented 5/17 as a level 1 trauma due to right chest wall crepitus, hypoxia, and HTN following falling out of bed onto her R side. Pt found to have R hemopneumothorax and R rib fxs 6-10. R chest tube placed 5/17. ETT 5/17-5/17. PMH: COPD.    Clinical Impression  Pt presents with condition above and deficits mentioned below, see PT Problem List. PTA, she was living alone in a house with ~10 STE and was independent. Currently, pt is demonstrating impairments in cognition, balance, general strength, and activity tolerance/endurance that impacts her safety and independence with all functional mobility. She is at risk for falls and is requiring minAx2 to power up and steady with transfers and to ambulate short bedroom distances. See General Comments below in regards to her SpO2 status during mobility. At this time, pt would be unsafe to be home alone and will need 24/7 assistance. Recommend follow-up with HHPT to maximize pt safety and independence with all functional mobility and decrease her risk for falls. Will continue to follow acutely.    Follow Up Recommendations Home health PT;Supervision/Assistance - 24 hour    Equipment Recommendations  Rolling walker with 5" wheels    Recommendations for Other Services       Precautions / Restrictions Precautions Precautions: Fall Precaution Comments: R chest tube to wall suction, monitor sats, HOH Restrictions Weight Bearing Restrictions: No      Mobility  Bed Mobility Overal bed mobility: Needs Assistance Bed Mobility: Supine to Sit     Supine to sit: HOB elevated;Min guard     General bed mobility comments: Pt able to come to long-sitting in bed with min guard, needing extra time to process and initiate transition to sitting EOB, squaring hips with EOB.     Transfers Overall transfer level: Needs assistance Equipment used: 2 person hand held assist Transfers: Sit to/from [facility] Sit to Stand: Min assist;+2 physical assistance;+2 safety/equipment Stand pivot transfers: Min assist;+2 physical assistance;+2 safety/equipment       General transfer comment: Bil HHA to direct pt, minAx2 to power up to stand, steady, and weight shift to take steps to R to transfer to chair. Cues for hand placement reaching back to control descent into chair. Sit to stand 1x from EOB and 1x from recliner.  Ambulation/Gait Ambulation/Gait assistance: Min assist;+2 physical assistance;+2 safety/equipment Gait Distance (Feet): 4 Feet Assistive device: 2 person hand held assist Gait Pattern/deviations: Step-through pattern;Decreased step length - right;Decreased step length - left;Decreased stride length;Decreased weight shift to right;Decreased weight shift to left;Shuffle;Trunk flexed Gait velocity: reduced Gait velocity interpretation: <1.31 ft/sec, indicative of household ambulator General Gait Details: Pt with short, shuffling steps to R with bil HHA minAx2 for steadying and cuing weight shifiting to direct pt to chair. Pt with increased lightheadedness and desat to 89% with ambulation, thus returned to sit to allow sats to recover to >/= 92%.  Stairs            Wheelchair Mobility    Modified Rankin (Stroke Patients Only) Modified Rankin (Stroke Patients Only) Pre-Morbid Rankin Score: No symptoms Modified Rankin: Moderately severe disability     Balance Overall balance assessment: Needs assistance Sitting-balance support: No upper extremity supported;Feet supported Sitting balance-Leahy Scale: Fair Sitting balance - Comments: Pt minimally reaching off BOS sitting EOB, min guard.   Standing balance  support: Bilateral upper extremity supported;During functional activity Standing balance-Leahy Scale: Poor Standing balance  comment: Reliant on bil UE support and external assist.                             Pertinent Vitals/Pain Pain Assessment: Faces Faces Pain Scale: Hurts little more Pain Location: R chest Pain Descriptors / Indicators: Discomfort;Grimacing;Guarding Pain Intervention(s): Monitored during session;Limited activity within patient's tolerance;Repositioned    Home Living Family/patient expects to be discharged to:: Private residence Living Arrangements: Alone Available Help at Discharge: Family;Available 24 hours/day Type of Home: House Home Access: Stairs to enter   [employer] of Steps: 10 Home Layout: One level;Able to live on main level with bedroom/bathroom;Laundry or work area in [employer]: None Additional Comments: family reports "we can try for 24/7"    Prior Function Level of Independence: Independent         Comments: reports independence; driving     Hand Dominance   Dominant Hand: Right    Extremity/Trunk Assessment   Upper Extremity Assessment Upper Extremity Assessment: Defer to OT evaluation    Lower Extremity Assessment Lower Extremity Assessment: Generalized weakness (MMT scores of 4- to 4+ grossly bil; 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)    Cervical / Trunk Assessment Cervical / Trunk Assessment: Kyphotic  Communication   Communication: HOH  Cognition Arousal/Alertness: Awake/alert Behavior During Therapy: Flat affect Overall Cognitive Status: Impaired/Different from baseline Area of Impairment: Attention;Following commands;Safety/judgement;Awareness;Problem solving                   Current Attention Level: Focused   Following Commands: Follows one step commands consistently;Follows one step commands with increased time Safety/Judgement: Decreased awareness of safety;Decreased awareness of deficits Awareness: Emergent Problem Solving: Slow processing;Decreased initiation;Difficulty  sequencing;Requires verbal cues;Requires tactile cues General Comments: Pt with flat affect throughout. Needs repeated cues and extra time to process and initiate tasks. Needs cues to sequence weight shifting with mobility. Pt with noted increased confusion compared to her baseline, per family.      General Comments General comments (skin integrity, edema, etc.): desat to mid-80s% on RA, thus re-donned [place] at 2L/min with sats decreasing to as low as 89% with standing mobility, but recovered to >/= 92% when sitting at rest    Exercises     Assessment/Plan    PT Assessment Patient needs continued PT services  PT Problem List Decreased strength;Decreased activity tolerance;Decreased range of motion;Decreased balance;Decreased mobility;Decreased coordination;Decreased cognition;Decreased knowledge of use of DME;Decreased safety awareness;Decreased knowledge of precautions;Impaired sensation;Pain       PT Treatment Interventions DME instruction;Gait training;Stair training;Functional mobility training;Therapeutic activities;Therapeutic exercise;Balance training;Neuromuscular re-education;Cognitive remediation;Patient/family education    PT Goals (Current goals can be found in the Care Plan section)  Acute Rehab PT Goals Patient Stated Goal: family desires pt to improve PT Goal Formulation: With patient/family Time For Goal Achievement: 05/26/21 Potential to Achieve Goals: Good    Frequency Min 3X/week   Barriers to discharge        Co-evaluation PT/OT/SLP Co-Evaluation/Treatment: Yes Reason for Co-Treatment: Necessary to address cognition/behavior during functional activity;For patient/therapist safety;To address functional/ADL transfers PT goals addressed during session: Mobility/safety with mobility;Balance         AM-PAC PT "6 Clicks" Mobility  Outcome Measure Help needed turning from your back to your side while in a flat bed without using bedrails?: A Little Help needed  moving from lying on your back to sitting on the  side of a flat bed without using bedrails?: A Little Help needed moving to and from a bed to a chair (including a wheelchair)?: A Lot Help needed standing up from a chair using your arms (e.g., wheelchair or bedside chair)?: A Lot Help needed to walk in hospital room?: A Lot Help needed climbing 3-5 steps with a railing? : A Lot 6 Click Score: 14    End of Session Equipment Utilized During Treatment: Gait belt;Oxygen Activity Tolerance: Patient limited by fatigue;Patient tolerated treatment well Patient left: in chair;with call bell/phone within reach;with chair alarm set Nurse Communication: Mobility status;Other (comment) (sats) PT Visit Diagnosis: Unsteadiness on feet (R26.81);Other abnormalities of gait and mobility (R26.89);Muscle weakness (generalized) (M62.81);History of falling (Z91.81);Difficulty in walking, not elsewhere classified (R26.2);Pain Pain - Right/Left: Right Pain - part of body:  (chest)    Time: 5093-2671 PT Time Calculation (min) (ACUTE ONLY): 37 min   Charges:   PT Evaluation $PT Eval Moderate Complexity: 1 Mod          Moishe Spice, PT, DPT Acute Rehabilitation Services  Pager: [phone] Office: [phone]   Orvan Falconer 05/12/2021, 2:32 PM

## 2021-05-13 ENCOUNTER — Inpatient Hospital Stay (HOSPITAL_COMMUNITY): Payer: Medicare Other

## 2021-05-13 ENCOUNTER — Telehealth: Payer: Self-pay | Admitting: Pharmacist

## 2021-05-13 LAB — BASIC METABOLIC PANEL
Anion gap: 6 (ref 5–15)
BUN: 14 mg/dL (ref 8–23)
CO2: 25 mmol/L (ref 22–32)
Calcium: 8.9 mg/dL (ref 8.9–10.3)
Chloride: 101 mmol/L (ref 98–111)
Creatinine, Ser: 0.76 mg/dL (ref 0.44–1.00)
GFR, Estimated: >60 mL/min (ref >60)
Glucose, Bld: 118 mg/dL — ABNORMAL HIGH (ref 70–99)
Potassium: 4.4 mmol/L (ref 3.5–5.1)
Sodium: 132 mmol/L — ABNORMAL LOW (ref 135–145)

## 2021-05-13 LAB — CBC
HCT: 31.6 % — ABNORMAL LOW (ref 36.0–46.0)
Hemoglobin: 10.4 g/dL — ABNORMAL LOW (ref 12.0–15.0)
MCH: 30.2 pg (ref 26.0–34.0)
MCHC: 32.9 g/dL (ref 30.0–36.0)
MCV: 91.9 fL (ref 80.0–100.0)
Platelets: 195 10*3/uL (ref 150–400)
RBC: 3.44 MIL/uL — ABNORMAL LOW (ref 3.87–5.11)
RDW: 12.4 % (ref 11.5–15.5)
WBC: 9 10*3/uL (ref 4.0–10.5)
nRBC: 0 % (ref 0.0–0.2)

## 2021-05-13 MED ORDER — BOOST / RESOURCE BREEZE PO LIQD CUSTOM
1.0000 | Freq: Three times a day (TID) | ORAL | Status: DC
  Administered 2021-05-13 – 2021-05-20 (×9): 1 via ORAL

## 2021-05-13 NOTE — Progress Notes (Signed)
Physical Therapy Treatment Patient Details Name: Melanie [name] MRN: [id number] DOB: 12-Feb-1941 Today's Date: 05/13/2021    History of Present Illness Pt is an 80 y.o. female who presented 5/17 as a level 1 trauma due to right chest wall crepitus, hypoxia, and HTN following falling out of bed onto her R side. Pt found to have R hemopneumothorax and R rib fxs 6-10. R chest tube placed 5/17. ETT 5/17-5/17. PMH: COPD.    PT Comments    Pt demonstrating improved cognitive status this date, attending to conversations and communicating more appropriately. However, she continues to require increased time to process cues and recall the current year. Pt needing increased time and cues to sequence tasks also. Pt limited in mobility by her nausea and lightheadedness, VSS during session. Pt needed minA for bed mobility and modA for transfers and remains at high risk for falls. Of note, pt reported moments where static objects on the walls would move slightly down the wall throughout the day, but pt denied this occurring during the session and thus unable to identify if covering one eye improved her vision. Will continue to follow acutely. Current recommendations remain appropriate.   Follow Up Recommendations  Home health PT;Supervision/Assistance - 24 hour     Equipment Recommendations  Rolling walker with 5" wheels    Recommendations for Other Services       Precautions / Restrictions Precautions Precautions: Fall Precaution Comments: R chest tube to wall suction, monitor sats, HOH Restrictions Weight Bearing Restrictions: No    Mobility  Bed Mobility Overal bed mobility: Needs Assistance Bed Mobility: Supine to Sit;Sit to Supine     Supine to sit: HOB elevated;Min assist Sit to supine: Min assist   General bed mobility comments: MinA to manage trunk with bed mobility, needing extra time to come to sit R EOB due to R chest pain.    Transfers Overall transfer level: Needs  assistance Equipment used: Rolling walker (2 wheeled) Transfers: Sit to/from Stand Sit to Stand: Mod assist         General transfer comment: Cues for 1 hand on bed to push up to RW, needing extra time and modA to power up to stand.  Ambulation/Gait             General Gait Details: Pt unable to take steps this date due to feeling nauseated and lightheaded so performed weight shifting laterally for a brief time instead for prep for gait   Stairs             Wheelchair Mobility    Modified Rankin (Stroke Patients Only) Modified Rankin (Stroke Patients Only) Pre-Morbid Rankin Score: No symptoms Modified Rankin: Moderately severe disability     Balance Overall balance assessment: Needs assistance Sitting-balance support: No upper extremity supported;Feet supported Sitting balance-Leahy Scale: Fair Sitting balance - Comments: Min guard for safety.   Standing balance support: Bilateral upper extremity supported Standing balance-Leahy Scale: Poor Standing balance comment: Reliant on bil UE support and external assist.                            Cognition Arousal/Alertness: Awake/alert Behavior During Therapy: WFL for tasks assessed/performed Overall Cognitive Status: Impaired/Different from baseline Area of Impairment: Attention;Following commands;Safety/judgement;Awareness;Problem solving                   Current Attention Level: Focused   Following Commands: Follows one step commands consistently;Follows one step commands with increased time  Safety/Judgement: Decreased awareness of safety;Decreased awareness of deficits Awareness: Emergent Problem Solving: Slow processing;Decreased initiation;Difficulty sequencing;Requires verbal cues;Requires tactile cues General Comments: Pt reports feeling nauseous throughout session. Pt expressing impaired cognition for verbal tasks requiring repeitition. pt following 1 step commands consistently. Pt  with noted increased confusion compared to her baseline, needing increased time to process info and cues and to recall year. However, improved speed with communication and attention during session compared to prior session.      Exercises      General Comments General comments (skin integrity, edema, etc.): VSS and BP within her normal range this date per prior monitor readings, on 3L O2 via [place]      Pertinent Vitals/Pain Pain Assessment: Faces Faces Pain Scale: Hurts even more Pain Location: R chest Pain Descriptors / Indicators: Discomfort;Grimacing;Guarding Pain Intervention(s): Limited activity within patient's tolerance;Monitored during session;Repositioned;Patient requesting pain meds-RN notified    Home Living                      Prior Function            PT Goals (current goals can now be found in the care plan section) Acute Rehab PT Goals Patient Stated Goal: to get better PT Goal Formulation: With patient/family Time For Goal Achievement: 05/26/21 Potential to Achieve Goals: Good Progress towards PT goals: Progressing toward goals    Frequency    Min 3X/week      PT Plan Current plan remains appropriate    Co-evaluation              AM-PAC PT "6 Clicks" Mobility   Outcome Measure  Help needed turning from your back to your side while in a flat bed without using bedrails?: A Little Help needed moving from lying on your back to sitting on the side of a flat bed without using bedrails?: A Little Help needed moving to and from a bed to a chair (including a wheelchair)?: A Lot Help needed standing up from a chair using your arms (e.g., wheelchair or bedside chair)?: A Lot Help needed to walk in hospital room?: A Lot Help needed climbing 3-5 steps with a railing? : A Lot 6 Click Score: 14    End of Session Equipment Utilized During Treatment: Gait belt;Oxygen Activity Tolerance: Patient limited by fatigue;Other (comment) (limited by nausea  and lightheadedness) Patient left: with call bell/phone within reach;in bed;with bed alarm set;with family/visitor present Nurse Communication: Mobility status;Patient requests pain meds;Other (comment) (sats) PT Visit Diagnosis: Unsteadiness on feet (R26.81);Other abnormalities of gait and mobility (R26.89);Muscle weakness (generalized) (M62.81);History of falling (Z91.81);Difficulty in walking, not elsewhere classified (R26.2);Pain Pain - Right/Left: Right Pain - part of body:  (chest)     Time: 7867-6720 PT Time Calculation (min) (ACUTE ONLY): 39 min  Charges:  $Therapeutic Activity: 38-52 mins                     Moishe Spice, PT, DPT Acute Rehabilitation Services  Pager: [phone] Office: [phone]    Orvan Falconer 05/13/2021, 6:06 PM

## 2021-05-13 NOTE — Progress Notes (Addendum)
Had to waste a 5mg  pill of oxycodone. It fell on the floor. Got another pill per charge nurse.

## 2021-05-13 NOTE — Plan of Care (Signed)
  Problem: Clinical Measurements: Goal: Ability to maintain clinical measurements within normal limits will improve Outcome: Progressing Goal: Will remain free from infection Outcome: Completed/Met

## 2021-05-13 NOTE — Plan of Care (Signed)
  Problem: Education: Goal: Knowledge of General Education information will improve Description: Including pain rating scale, medication(s)/side effects and non-pharmacologic comfort measures Outcome: Not Progressing   Problem: Health Behavior/Discharge Planning: Goal: Ability to manage health-related needs will improve Outcome: Progressing   Problem: Clinical Measurements: Goal: Diagnostic test results will improve Outcome: Progressing Goal: Respiratory complications will improve Outcome: Progressing

## 2021-05-13 NOTE — Chronic Care Management (AMB) (Signed)
Chronic Care Management Pharmacy Assistant   Name: Melanie [name]  MRN: [id number] DOB: 1941-01-15  Reason for Encounter: Disease State/ General Assessment Call.    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 No medication changes. Follow up in 6 months.   Recent consult visits:  None.   Hospital visits:  None in previous 6 months  Medications: Outpatient Encounter Medications as of 05/13/2021  Medication Sig  . acetaminophen (TYLENOL) 650 MG CR tablet Take 650 mg by mouth every 8 (eight) hours as needed.  . Calcium Carbonate (CALCIUM 600 PO) Take 1 tablet by mouth daily.  . cetirizine (ZYRTEC) 10 MG tablet Take 0.5 tablets (5 mg total) by mouth daily. (Patient taking differently: Take 10 mg by mouth daily.)  . fluticasone (FLONASE) 50 MCG/ACT nasal spray Place 1 spray into both nostrils 2 (two) times daily.  Marland Kitchen ipratropium (ATROVENT) 0.06 % nasal spray Place 2 sprays into both nostrils 4 (four) times daily.  Marland Kitchen loperamide (IMODIUM) 2 MG capsule Take 2 mg by mouth as needed.  Marland Kitchen LORazepam (ATIVAN) 0.5 MG tablet Take 0.5-1 tablets (0.25-0.5 mg total) by mouth 2 (two) times daily as needed. for anxiety  . losartan (COZAAR) 100 MG tablet TAKE 1 TABLET BY MOUTH EVERY DAY  . pantoprazole (PROTONIX) 20 MG tablet TAKE 1 TABLET BY MOUTH 30 MINUTES BEFORE BREAKFAST. STOP OMEPRAZOLE  . Red Yeast Rice 600 MG CAPS Take 600 mg by mouth 2 (two) times daily.   No facility-administered encounter medications on file as of 05/13/2021.    Reviewed chart prior to disease state call. Spoke with patient regarding BP  Recent Office Vitals: BP Readings from Last 3 Encounters:  03/30/21 128/70  09/09/20 120/70  05/19/20 140/70   Pulse Readings from Last 3 Encounters:  03/30/21 87  09/09/20 83  05/19/20 71    Wt Readings from Last 3 Encounters:  03/30/21 134  lb 2 oz (60.8 kg)  09/09/20 133 lb (60.3 kg)  05/19/20 131 lb 6 oz (59.6 kg)     Kidney Function Lab Results  Component Value Date/Time   CREATININE 0.86 03/30/2021 01:01 PM   CREATININE 0.87 09/09/2020 12:22 PM   CREATININE 0.73 02/05/2020 11:55 AM   CREATININE 0.83 04/14/2016 10:47 AM   GFR 63.25 03/30/2021 01:01 PM   GFRNONAA 62 09/09/2020 12:22 PM   GFRAA 72 09/09/2020 12:22 PM    BMP Latest Ref Rng & Units 03/30/2021 09/09/2020 02/05/2020  Glucose 70 - 99 mg/dL 92 88 102(H)  BUN 6 - 23 mg/dL 17 18 13   Creatinine 0.40 - 1.20 mg/dL 0.86 0.87 0.73  BUN/Creat Ratio 6 - 22 (calc) - NOT APPLICABLE -  Sodium 778 - 145 mEq/L 139 136 138  Potassium 3.5 - 5.1 mEq/L 4.4 4.0 3.7  Chloride 96 - 112 mEq/L 99 99 98  CO2 19 - 32 mEq/L 34(H) 32 31  Calcium 8.4 - 10.5 mg/dL 10.2 9.9 10.1    . Current antihypertensive regimen:   Losartan 100 mg 1 tablet daily  . How often are you checking your Blood Pressure?   . Current home BP readings:   . What recent interventions/DTPs have been made by any provider to improve Blood Pressure control since last CPP Visit: None.   . Any recent hospitalizations or ED visits since last visit with CPP? No  . What diet changes have  been made to improve Blood Pressure Control?  o   . What exercise is being done to improve your Blood Pressure Control?  o   Adherence Review: Is the patient currently on ACE/ARB medication? Yes Does the patient have >5 day gap between last estimated fill dates? No  Comprehensive medication review performed; Spoke to patient regarding cholesterol  Lipid Panel No results found for: CHOL, TRIG, HDL, LDLCALC, LDLDIRECT  10-year ASCVD risk score: The ASCVD Risk score Mikey Bussing DC Jr., et al., 2013) failed to calculate for the following reasons:   The 2013 ASCVD risk score is only valid for ages 57 to 60  . Current antihyperlipidemic regimen:   Red yeast rice 600 mg 1 capsule twice daily  . Previous antihyperlipidemic  medications tried: unknown  . ASCVD risk enhancing conditions: age >100 and HTN  . What recent interventions/DTPs have been made by any provider to improve Cholesterol control since last CPP Visit: None.   . Any recent hospitalizations or ED visits since last visit with CPP? No  . What diet changes have been made to improve Cholesterol?  o   . What exercise is being done to improve Cholesterol?  o   Adherence Review: Does the patient have >5 day gap between last estimated fill dates? No  Multiple unsuccessful attempts to reach patient by phone.   Star Rating Drugs:   Losartan 100mg  - last filled on 02/24/21 90DS at [facility].   [place]  Clinical Pharmacist Assistant [phone]

## 2021-05-13 NOTE — Progress Notes (Signed)
Progress Note     Subjective: CC: She just had bed bath and moved from bed to chair before my visit. She is tachycardic. She is tired and with increased right rib pain from this. She denies needing more pain medication. She has had some nausea today but no emesis. She states she did not eat yesterday and has very little appetite. Passing flatus. No BM since PTA. She has SHOB with movement on supplemental O2. She is using IS.   Objective: Vital signs in last 24 hours: Temp:  [97.9 F (36.6 C)-100.2 F (37.9 C)] 98 F (36.7 C) (05/19 0729) Pulse Rate:  [81-113] 99 (05/19 0729) Resp:  [12-28] 18 (05/19 0729) BP: (102-167)/(43-74) 143/67 (05/19 0729) SpO2:  [91 %-99 %] 97 % (05/19 0729) Last BM Date:  (PTA)  Intake/Output from previous day: 05/18 0701 - 05/19 0700 In: 384.2 [I.V.:284.2; IV Piggyback:100.1] Out: 325 [Urine:325] Intake/Output this shift: No intake/output data recorded.  PE: General: pleasant, frail, female who is sitting up in chair in NAD HEENT: head is normocephalic, atraumatic.   Heart: tachycardic.  Normal s1,s2. Palpable radial and pedal pulses bilaterally Lungs: CTAB. Respiratory effort nonlabored on 4 lpm O2 via [place]. CT bandage c/d/i and no air leak Abd: soft, NT, ND, +BS MS: all 4 extremities are symmetrical with no cyanosis, clubbing, or edema. Diffuse ecchymosis to right ribs and flank Skin: warm and dry with no masses, lesions, or rashes Psych: A&Ox3 with an appropriate affect.    Lab Results:  Recent Labs    05/11/21 0553 05/12/21 0513 05/13/21 0102  WBC 10.5  --  9.0  HGB 12.0 9.9* 10.4*  HCT 37.8 29.0* 31.6*  PLT 205  --  195   BMET Recent Labs    05/11/21 0553 05/12/21 0513 05/13/21 0102  NA 138 135 132*  K 4.5 4.3 4.4  CL 103  --  101  CO2 29  --  25  GLUCOSE 110*  --  118*  BUN 17  --  14  CREATININE 0.97  --  0.76  CALCIUM 8.8*  --  8.9   PT/INR Recent Labs    05/11/21 0035  LABPROT 13.0  INR 1.0   CMP      Component Value Date/Time   NA 132 (L) 05/13/2021 0102   K 4.4 05/13/2021 0102   CL 101 05/13/2021 0102   CO2 25 05/13/2021 0102   GLUCOSE 118 (H) 05/13/2021 0102   BUN 14 05/13/2021 0102   CREATININE 0.76 05/13/2021 0102   CALCIUM 8.9 05/13/2021 0102   PROT 6.7 05/11/2021 0035   ALBUMIN 3.6 05/11/2021 0035   AST 25 05/11/2021 0035   ALT 13 05/11/2021 0035   ALKPHOS 54 05/11/2021 0035   BILITOT 0.5 05/11/2021 0035   GFRNONAA >60 05/13/2021 0102   Lipase  No results found for: LIPASE     Studies/Results: DG CHEST PORT 1 VIEW  Result Date: 05/13/2021 CLINICAL DATA:  Pneumothorax. EXAM: PORTABLE CHEST 1 VIEW COMPARISON:  05/12/2021. FINDINGS: Mediastinum and hilar structures are normal. Heart size stable. Right chest tube in stable position. Improved right apical pneumothorax with mild residual. Persistent right base atelectasis/infiltrate and right-sided pleural effusion. No interim change. Multiple right rib fractures again noted. Right chest wall subcutaneous emphysema again noted. IMPRESSION: 1. Right chest tube in stable position. Improved right apical pneumothorax with mild residual. 2. Persistent right base atelectasis/infiltrate right-sided pleural effusion. No interim change. 3. Multiple right rib fractures again noted. Right chest wall subcutaneous emphysema again  noted. Electronically Signed   By: Marcello Moores  Register   On: 05/13/2021 05:21   DG CHEST PORT 1 VIEW  Result Date: 05/12/2021 CLINICAL DATA:  Right pneumothorax EXAM: PORTABLE CHEST 1 VIEW COMPARISON:  05/11/2021 FINDINGS: Interval extubation and removal of the nasogastric tube. Pulmonary insufflation is preserved. Right mid lung zone pigtail chest tube is unchanged. Small right apical pneumothorax is unchanged. Enlarging, small to moderate right pleural effusion is present with increasing right basilar compressive atelectasis. Left lung is clear. No pneumothorax or pleural effusion on the left. Cardiac size within  normal limits. Pulmonary vascularity is normal. Multiple acute right rib fractures are again identified. Subcutaneous gas within the right chest wall appears stable. IMPRESSION: Interval extubation with preservation of pulmonary insufflation. Right chest tube in place. Stable small right apical pneumothorax. Enlarging right basilar small to moderate pleural effusion. Multiple acute right rib fractures again identified. Electronically Signed   By: Fidela Salisbury MD   On: 05/12/2021 05:10    Anti-infectives: Anti-infectives (From admission, onward)   None       Assessment/Plan GLF  Segmental R rib FX 6-10 with PTX and traumatic pneumatoceles - leave to -20 suction today, CXR today - stable to improved but persistent right apical PTX, 0 mL output Acute hypercarbic respiratory failure - tolerated extubation 5/17, pulm toilet COPD - duoneb PRN HTN - home losartan  Tmax over the last 24H 100.5F. WBC nml - monitor  FEN - clears and advance if she tolerates, ensure breeze VTE - Lovenox  Dispo - PT/OT    LOS: 2 days    Winferd Humphrey, [facility] 05/13/2021, 8:22 AM Please see Amion for pager number during day hours 7:00am-4:30pm

## 2021-05-14 ENCOUNTER — Inpatient Hospital Stay (HOSPITAL_COMMUNITY): Payer: Medicare Other

## 2021-05-14 LAB — CBC
HCT: 31.8 % — ABNORMAL LOW (ref 36.0–46.0)
Hemoglobin: 10.7 g/dL — ABNORMAL LOW (ref 12.0–15.0)
MCH: 30.2 pg (ref 26.0–34.0)
MCHC: 33.6 g/dL (ref 30.0–36.0)
MCV: 89.8 fL (ref 80.0–100.0)
Platelets: 233 10*3/uL (ref 150–400)
RBC: 3.54 MIL/uL — ABNORMAL LOW (ref 3.87–5.11)
RDW: 12.1 % (ref 11.5–15.5)
WBC: 8.4 10*3/uL (ref 4.0–10.5)
nRBC: 0 % (ref 0.0–0.2)

## 2021-05-14 LAB — BASIC METABOLIC PANEL
Anion gap: 5 (ref 5–15)
BUN: 19 mg/dL (ref 8–23)
CO2: 27 mmol/L (ref 22–32)
Calcium: 8.9 mg/dL (ref 8.9–10.3)
Chloride: 97 mmol/L — ABNORMAL LOW (ref 98–111)
Creatinine, Ser: 0.71 mg/dL (ref 0.44–1.00)
GFR, Estimated: >60 mL/min (ref >60)
Glucose, Bld: 104 mg/dL — ABNORMAL HIGH (ref 70–99)
Potassium: 4 mmol/L (ref 3.5–5.1)
Sodium: 129 mmol/L — ABNORMAL LOW (ref 135–145)

## 2021-05-14 MED ORDER — FUROSEMIDE 20 MG PO TABS
20.0000 mg | ORAL_TABLET | Freq: Once | ORAL | Status: AC
  Administered 2021-05-14: 20 mg via ORAL
  Filled 2021-05-14: qty 1

## 2021-05-14 MED ORDER — METOPROLOL TARTRATE 5 MG/5ML IV SOLN
5.0000 mg | Freq: Once | INTRAVENOUS | Status: AC
  Administered 2021-05-14: 5 mg via INTRAVENOUS

## 2021-05-14 MED ORDER — METHOCARBAMOL 1000 MG/10ML IJ SOLN
500.0000 mg | Freq: Three times a day (TID) | INTRAVENOUS | Status: DC
  Administered 2021-05-14 – 2021-05-15 (×3): 500 mg via INTRAVENOUS
  Filled 2021-05-14 (×7): qty 5

## 2021-05-14 MED ORDER — POLYETHYLENE GLYCOL 3350 17 G PO PACK
17.0000 g | PACK | Freq: Every day | ORAL | Status: DC | PRN
  Administered 2021-05-14 – 2021-05-15 (×2): 17 g via ORAL
  Filled 2021-05-14 (×2): qty 1

## 2021-05-14 MED ORDER — SODIUM CHLORIDE 1 G PO TABS
1.0000 g | ORAL_TABLET | Freq: Three times a day (TID) | ORAL | Status: DC
  Administered 2021-05-14 – 2021-05-15 (×3): 1 g via ORAL
  Filled 2021-05-14 (×3): qty 1

## 2021-05-14 NOTE — Progress Notes (Signed)
Progress Note     Subjective: CC: Events overnight noted. Patient does not remember pulling out her IV and says she slept okay. Pain is better controlled this am. She still has nausea and low appetite but thinks its related to medications and food taste. She is working with PT.  Objective: Vital signs in last 24 hours: Temp:  [97.3 F (36.3 C)-98.4 F (36.9 C)] 97.9 F (36.6 C) (05/20 0402) Pulse Rate:  [89-118] 92 (05/20 0402) Resp:  [14-24] 14 (05/20 0402) BP: (122-170)/(54-73) 122/64 (05/20 0402) SpO2:  [94 %-98 %] 97 % (05/20 0402) Last BM Date:  (PTA)  Intake/Output from previous day: 05/19 0701 - 05/20 0700 In: -  Out: 1100 [Urine:1100] Intake/Output this shift: No intake/output data recorded.  PE: General: pleasant, frail, female who is laying in bed in NAD HEENT: head is normocephalic, atraumatic.   Heart: regular rate and rhythm. Palpable radial and pedal pulses bilaterally Lungs: CTAB. Respiratory effort nonlabored on 3 lpm O2 via [place]. Chest tube site uncovered - sutures intact without surrounding erythema or discharge Abd: soft, NT, ND, +BS MS: all 4 extremities are symmetrical with no cyanosis, clubbing, or edema. Diffuse ecchymosis to right ribs and flank Skin: warm and dry with no masses, lesions, or rashes Psych: A&Ox3 with an appropriate affect.    Lab Results:  Recent Labs    05/13/21 0102 05/14/21 0418  WBC 9.0 8.4  HGB 10.4* 10.7*  HCT 31.6* 31.8*  PLT 195 233   BMET Recent Labs    05/13/21 0102 05/14/21 0418  NA 132* 129*  K 4.4 4.0  CL 101 97*  CO2 25 27  GLUCOSE 118* 104*  BUN 14 19  CREATININE 0.76 0.71  CALCIUM 8.9 8.9   PT/INR No results for input(s): LABPROT, INR in the last 72 hours. CMP     Component Value Date/Time   NA 129 (L) 05/14/2021 0418   K 4.0 05/14/2021 0418   CL 97 (L) 05/14/2021 0418   CO2 27 05/14/2021 0418   GLUCOSE 104 (H) 05/14/2021 0418   BUN 19 05/14/2021 0418   CREATININE 0.71 05/14/2021 0418    CALCIUM 8.9 05/14/2021 0418   PROT 6.7 05/11/2021 0035   ALBUMIN 3.6 05/11/2021 0035   AST 25 05/11/2021 0035   ALT 13 05/11/2021 0035   ALKPHOS 54 05/11/2021 0035   BILITOT 0.5 05/11/2021 0035   GFRNONAA >60 05/14/2021 0418   Lipase  No results found for: LIPASE     Studies/Results: DG CHEST PORT 1 VIEW  Result Date: 05/14/2021 CLINICAL DATA:  Pneumothorax EXAM: PORTABLE CHEST 1 VIEW COMPARISON:  05/13/2021 FINDINGS: Pulmonary insufflation is stable. Right-sided pigtail chest tube is unchanged in position. Tiny right apical pneumothorax has decreased in size, now nearly completely resolved. Moderate right pleural effusion with associated right basilar compressive atelectasis and superimposed right basilar pulmonary infiltrate are unchanged. Left lung is clear. No pneumothorax or pleural effusion on the left. Multiple acute right rib fractures again noted. Cardiac size within normal limits. Pulmonary vascularity is normal. IMPRESSION: Decreasing, miniscule right apical pneumothorax, now nearly completely resolved. Right chest tube unchanged. Stable moderate right pleural effusion and associated right basilar atelectasis and infiltrate. Electronically Signed   By: Fidela Salisbury MD   On: 05/14/2021 05:15   DG CHEST PORT 1 VIEW  Result Date: 05/13/2021 CLINICAL DATA:  Pneumothorax. EXAM: PORTABLE CHEST 1 VIEW COMPARISON:  05/12/2021. FINDINGS: Mediastinum and hilar structures are normal. Heart size stable. Right chest tube in stable position.  Improved right apical pneumothorax with mild residual. Persistent right base atelectasis/infiltrate and right-sided pleural effusion. No interim change. Multiple right rib fractures again noted. Right chest wall subcutaneous emphysema again noted. IMPRESSION: 1. Right chest tube in stable position. Improved right apical pneumothorax with mild residual. 2. Persistent right base atelectasis/infiltrate right-sided pleural effusion. No interim change. 3.  Multiple right rib fractures again noted. Right chest wall subcutaneous emphysema again noted. Electronically Signed   By: Marcello Moores  Register   On: 05/13/2021 05:21    Anti-infectives: Anti-infectives (From admission, onward)   None       Assessment/Plan GLF  Segmental R rib FX 6-10 with PTX and traumatic pneumatoceles - CXR today - miniscule PTX. Water seal. Recheck CXR am Acute hypercarbic respiratory failure - tolerated extubation 5/17, pulm toilet COPD - duoneb PRN HTN - home losartan Hyponatremia - UOP 1,100 mL. Replete orally  FEN - clears and advance if she tolerates, ensure breeze VTE - Lovenox  Dispo - PT/OT. Recommending 24H supervision. She states she has son and daughter who may be able to help with her care    LOS: 3 days    Winferd Humphrey, [facility] 05/14/2021, 7:35 AM Please see Amion for pager number during day hours 7:00am-4:30pm

## 2021-05-14 NOTE — Care Management Important Message (Signed)
Important Message  Patient Details  Name: Melanie [name] MRN: [id number] Date of Birth: 01/28/1941   Medicare Important Message Given:  Yes     Kanija Remmel Montine Circle 05/14/2021, 2:11 PM

## 2021-05-14 NOTE — Progress Notes (Signed)
Patient's rhythm  was  bouncing between normal sinus and sinus tachycardia converted  in to affib with rates in the 130s. Bp stable and no shortness of breath. Md Greer Pickerel  paged with ekg results. Order received for 5mg  Iv metoprolol. Unfortunately patient has pulled her Iv access out so this RN and oncoming RN  tried unsuccessfully to place an IV. Iv team in room now to place an IV line. Report given to oncoming RN Selinda Eon.

## 2021-05-14 NOTE — Telephone Encounter (Cosign Needed)
2nd attempt

## 2021-05-15 ENCOUNTER — Inpatient Hospital Stay (HOSPITAL_COMMUNITY): Payer: Medicare Other

## 2021-05-15 LAB — CBC
HCT: 32 % — ABNORMAL LOW (ref 36.0–46.0)
Hemoglobin: 10.6 g/dL — ABNORMAL LOW (ref 12.0–15.0)
MCH: 29.9 pg (ref 26.0–34.0)
MCHC: 33.1 g/dL (ref 30.0–36.0)
MCV: 90.1 fL (ref 80.0–100.0)
Platelets: 297 10*3/uL (ref 150–400)
RBC: 3.55 MIL/uL — ABNORMAL LOW (ref 3.87–5.11)
RDW: 12.2 % (ref 11.5–15.5)
WBC: 6.4 10*3/uL (ref 4.0–10.5)
nRBC: 0 % (ref 0.0–0.2)

## 2021-05-15 LAB — BASIC METABOLIC PANEL
Anion gap: 7 (ref 5–15)
BUN: 22 mg/dL (ref 8–23)
CO2: 30 mmol/L (ref 22–32)
Calcium: 9.4 mg/dL (ref 8.9–10.3)
Chloride: 94 mmol/L — ABNORMAL LOW (ref 98–111)
Creatinine, Ser: 0.75 mg/dL (ref 0.44–1.00)
GFR, Estimated: >60 mL/min (ref >60)
Glucose, Bld: 101 mg/dL — ABNORMAL HIGH (ref 70–99)
Potassium: 3.7 mmol/L (ref 3.5–5.1)
Sodium: 131 mmol/L — ABNORMAL LOW (ref 135–145)

## 2021-05-15 MED ORDER — SODIUM CHLORIDE 1 G PO TABS
1.0000 g | ORAL_TABLET | Freq: Three times a day (TID) | ORAL | Status: DC
  Administered 2021-05-15: 1 g via ORAL
  Filled 2021-05-15: qty 1

## 2021-05-15 MED ORDER — SODIUM CHLORIDE 1 G PO TABS
1.0000 g | ORAL_TABLET | Freq: Three times a day (TID) | ORAL | Status: AC
  Administered 2021-05-15 – 2021-05-16 (×2): 1 g via ORAL
  Filled 2021-05-15 (×2): qty 1

## 2021-05-15 MED ORDER — METHOCARBAMOL 500 MG PO TABS
500.0000 mg | ORAL_TABLET | Freq: Three times a day (TID) | ORAL | Status: DC | PRN
  Administered 2021-05-16 – 2021-05-19 (×5): 500 mg via ORAL
  Filled 2021-05-15 (×7): qty 1

## 2021-05-15 NOTE — Progress Notes (Signed)
Occupational Therapy Treatment Patient Details Name: Melanie [name] MRN: [id number] DOB: 07/03/1941 Today's Date: 05/15/2021    History of present illness Pt is an 80 y.o. female who presented 5/17 as a level 1 trauma due to right chest wall crepitus, hypoxia, and HTN following falling out of bed onto her R side. Pt found to have R hemopneumothorax and R rib fxs 6-10. R chest tube placed 5/17. ETT 5/17-5/17. PMH: COPD.   OT comments  Pt. Seen for skilled OT treatment session.  Moving well today.  Able to complete bed mobility oob S.  Min a sit/stand and short distance ambulation to recliner.  Able to reach b les by crossing legs over knees for LB dressing task with socks.  Cont. With current acute OT goals.   Follow Up Recommendations  Home health OT;Supervision/Assistance - 24 hour    Equipment Recommendations  3 in 1 bedside commode;Wheelchair (measurements OT);Wheelchair cushion (measurements OT)    Recommendations for Other Services      Precautions / Restrictions Precautions Precautions: Fall Precaution Comments: R chest tube to wall suction, monitor sats, HOH       Mobility Bed Mobility Overal bed mobility: Needs Assistance Bed Mobility: Supine to Sit     Supine to sit: HOB elevated;Supervision     General bed mobility comments: able to manage guiding trunk and les edge and off of bed without physical assistance    Transfers Overall transfer level: Needs assistance Equipment used: Rolling walker (2 wheeled) Transfers: Sit to/from [facility] Sit to Stand: Min assist Stand pivot transfers: Min assist       General transfer comment: Cues for 1 hand on bed to push up to RW, needing extra time and modA to power up to stand.    Balance                                           ADL either performed or assessed with clinical judgement   ADL Overall ADL's : Needs assistance/impaired                     Lower Body  Dressing: Min guard;Sitting/lateral leans Lower Body Dressing Details (indicate cue type and reason): able to don socks seated eob Toilet Transfer: Minimal assistance;RW;Ambulation Toilet Transfer Details (indicate cue type and reason): simulated eob and approx. 5 steps to recliner         Functional mobility during ADLs: Minimal assistance;Rolling walker General ADL Comments: pt. following commands consistently this day, and appearing more oriented. rn in room and asking pt. questions and she was able to state where she was, why ect.     Vision       Perception     Praxis      Cognition Arousal/Alertness: Awake/alert Behavior During Therapy: WFL for tasks assessed/performed Overall Cognitive Status: Within Functional Limits for tasks assessed                                 General Comments: pt. with improvements from previous documentation.  oriented during rn assesment answering questions appropriately.  following commands consistently        Exercises     Shoulder Instructions       General Comments      Pertinent Vitals/ Pain  Pain Assessment: No/denies pain  Home Living                                          Prior Functioning/Environment              Frequency  Min 2X/week        Progress Toward Goals  OT Goals(current goals can now be found in the care plan section)  Progress towards OT goals: Progressing toward goals     Plan Discharge plan remains appropriate    Co-evaluation                 AM-PAC OT "6 Clicks" Daily Activity     Outcome Measure   Help from another person eating meals?: A Lot Help from another person taking care of personal grooming?: A Lot Help from another person toileting, which includes using toliet, bedpan, or urinal?: Total Help from another person bathing (including washing, rinsing, drying)?: A Lot Help from another person to put on and taking off regular upper  body clothing?: A Little Help from another person to put on and taking off regular lower body clothing?: A Lot 6 Click Score: 12    End of Session Equipment Utilized During Treatment: Gait belt;Oxygen      Activity Tolerance Patient tolerated treatment well   Patient Left in chair;with call bell/phone within reach   Nurse Communication          Time: 5397-6734 OT Time Calculation (min): 27 min  Charges: OT General Charges $OT Visit: 1 Visit OT Treatments $Self Care/Home Management : 23-37 mins  Sonia Baller, COTA/L Acute Rehabilitation [phone]   05/15/2021, 12:22 PM

## 2021-05-15 NOTE — Progress Notes (Signed)
Progress Note     Subjective: CC: NAEON. Afebrile. She still has low appetite and some nausea but not worsening. No SHOB on supplemental O2. Pain well controlled. No BM  Daughter and DIL bedside  Objective: Vital signs in last 24 hours: Temp:  [97.7 F (36.5 C)-98.9 F (37.2 C)] 97.9 F (36.6 C) (05/21 1100) Pulse Rate:  [79-108] 94 (05/21 0600) Resp:  [14-26] 16 (05/21 0600) BP: (101-175)/(45-79) 153/62 (05/21 1100) SpO2:  [93 %-99 %] 97 % (05/21 1100) Last BM Date:  (PTA)  Intake/Output from previous day: 05/20 0701 - 05/21 0700 In: -  Out: 700 [Urine:700] Intake/Output this shift: Total I/O In: -  Out: 200 [Urine:200]  PE: General: pleasant, frail, female who is laying in bed in NAD HEENT: head is normocephalic, atraumatic.   Heart: regular rate and rhythm. Palpable radial and pedal pulses bilaterally Lungs: CTAB. Respiratory effort nonlabored on 2 lpm O2 via [place]. Chest tube removed. Abd: soft, NT, ND, +BS MS: all 4 extremities are symmetrical with no cyanosis, clubbing, or edema. Diffuse ecchymosis to right ribs and flank Skin: warm and dry with no masses, lesions, or rashes Psych: A&Ox3 with an appropriate affect.    Lab Results:  Recent Labs    05/14/21 0418 05/15/21 0241  WBC 8.4 6.4  HGB 10.7* 10.6*  HCT 31.8* 32.0*  PLT 233 297   BMET Recent Labs    05/14/21 0418 05/15/21 0241  NA 129* 131*  K 4.0 3.7  CL 97* 94*  CO2 27 30  GLUCOSE 104* 101*  BUN 19 22  CREATININE 0.71 0.75  CALCIUM 8.9 9.4   PT/INR No results for input(s): LABPROT, INR in the last 72 hours. CMP     Component Value Date/Time   NA 131 (L) 05/15/2021 0241   K 3.7 05/15/2021 0241   CL 94 (L) 05/15/2021 0241   CO2 30 05/15/2021 0241   GLUCOSE 101 (H) 05/15/2021 0241   BUN 22 05/15/2021 0241   CREATININE 0.75 05/15/2021 0241   CALCIUM 9.4 05/15/2021 0241   PROT 6.7 05/11/2021 0035   ALBUMIN 3.6 05/11/2021 0035   AST 25 05/11/2021 0035   ALT 13 05/11/2021 0035    ALKPHOS 54 05/11/2021 0035   BILITOT 0.5 05/11/2021 0035   GFRNONAA >60 05/15/2021 0241   Lipase  No results found for: LIPASE     Studies/Results: DG CHEST PORT 1 VIEW  Result Date: 05/15/2021 CLINICAL DATA:  Pneumothorax. EXAM: PORTABLE CHEST 1 VIEW COMPARISON:  May 14, 2021 FINDINGS: The right-sided chest tube is stable. A small right apical pneumothorax is stable. Effusion with underlying opacity on the right is stable. The cardiomediastinal silhouette is unchanged. The left lung remains well aerated. No other changes. IMPRESSION: 1. Stable right chest tube and small right apical pneumothorax. Stable right pleural effusion with underlying opacity, likely atelectasis. Electronically Signed   By: Dorise Bullion III M.D   On: 05/15/2021 08:11   DG CHEST PORT 1 VIEW  Result Date: 05/14/2021 CLINICAL DATA:  Pneumothorax EXAM: PORTABLE CHEST 1 VIEW COMPARISON:  05/13/2021 FINDINGS: Pulmonary insufflation is stable. Right-sided pigtail chest tube is unchanged in position. Tiny right apical pneumothorax has decreased in size, now nearly completely resolved. Moderate right pleural effusion with associated right basilar compressive atelectasis and superimposed right basilar pulmonary infiltrate are unchanged. Left lung is clear. No pneumothorax or pleural effusion on the left. Multiple acute right rib fractures again noted. Cardiac size within normal limits. Pulmonary vascularity is normal. IMPRESSION: Decreasing,  miniscule right apical pneumothorax, now nearly completely resolved. Right chest tube unchanged. Stable moderate right pleural effusion and associated right basilar atelectasis and infiltrate. Electronically Signed   By: Fidela Salisbury MD   On: 05/14/2021 05:15    Anti-infectives: Anti-infectives (From admission, onward)   None       Assessment/Plan GLF  Segmental R rib FX 6-10 with PTX and traumatic pneumatoceles - CXR no PTX. Remove CT today and follow repeat CXR Acute  hypercarbic respiratory failure - tolerated extubation 5/17, pulm toilet. Wean O2 COPD - duoneb PRN HTN - home losartan Hyponatremia - UOP 1,100 mL. Replete orally  FEN - fulls and advance if she tolerates, ensure VTE - Lovenox  Dispo - PT/OT. Recommending 24H supervision. She states she has son and daughter who may be able to help with her care    LOS: 4 days    Winferd Humphrey, [facility] 05/15/2021, 1:06 PM Please see Amion for pager number during day hours 7:00am-4:30pm

## 2021-05-15 NOTE — Plan of Care (Signed)
  Problem: Clinical Measurements: Goal: Respiratory complications will improve Outcome: Progressing   Problem: Pain Managment: Goal: General experience of comfort will improve Outcome: Progressing   Problem: Safety: Goal: Ability to remain free from injury will improve Outcome: Progressing   Problem: Skin Integrity: Goal: Risk for impaired skin integrity will decrease Outcome: Progressing   Problem: Safety: Goal: Non-violent Restraint(s) Outcome: Completed/Met

## 2021-05-16 ENCOUNTER — Inpatient Hospital Stay (HOSPITAL_COMMUNITY): Payer: Medicare Other

## 2021-05-16 LAB — BASIC METABOLIC PANEL
Anion gap: 5 (ref 5–15)
BUN: 20 mg/dL (ref 8–23)
CO2: 32 mmol/L (ref 22–32)
Calcium: 9 mg/dL (ref 8.9–10.3)
Chloride: 95 mmol/L — ABNORMAL LOW (ref 98–111)
Creatinine, Ser: 0.62 mg/dL (ref 0.44–1.00)
GFR, Estimated: >60 mL/min (ref >60)
Glucose, Bld: 108 mg/dL — ABNORMAL HIGH (ref 70–99)
Potassium: 3.9 mmol/L (ref 3.5–5.1)
Sodium: 132 mmol/L — ABNORMAL LOW (ref 135–145)

## 2021-05-16 LAB — CBC
HCT: 30.7 % — ABNORMAL LOW (ref 36.0–46.0)
Hemoglobin: 10.4 g/dL — ABNORMAL LOW (ref 12.0–15.0)
MCH: 30.2 pg (ref 26.0–34.0)
MCHC: 33.9 g/dL (ref 30.0–36.0)
MCV: 89.2 fL (ref 80.0–100.0)
Platelets: 314 10*3/uL (ref 150–400)
RBC: 3.44 MIL/uL — ABNORMAL LOW (ref 3.87–5.11)
RDW: 12.2 % (ref 11.5–15.5)
WBC: 6.4 10*3/uL (ref 4.0–10.5)
nRBC: 0 % (ref 0.0–0.2)

## 2021-05-16 MED ORDER — POLYETHYLENE GLYCOL 3350 17 G PO PACK: 17.0000 g | PACK | Freq: Every day | ORAL | Status: DC

## 2021-05-16 MED ORDER — SODIUM CHLORIDE 1 G PO TABS
1.0000 g | ORAL_TABLET | Freq: Three times a day (TID) | ORAL | Status: AC
  Administered 2021-05-16 – 2021-05-17 (×3): 1 g via ORAL
  Filled 2021-05-16 (×2): qty 1

## 2021-05-16 NOTE — Progress Notes (Addendum)
Progress Note     Subjective: CC: NAEON. Afebrile. Nausea improved today. Low appetite still but eating some fulls. Family reported yesterday she has good appetite at home. Per patient and nurse yesterday she has some difficulty swallowing pills but patient relates this to her reflux. Denies sensation of SHOB on supplemental O2. Has had a BM  Objective: Vital signs in last 24 hours: Temp:  [97.7 F (36.5 C)-98.1 F (36.7 C)] 98.1 F (36.7 C) (05/22 0818) Pulse Rate:  [90-141] 90 (05/22 1138) Resp:  [17-20] 20 (05/22 0818) BP: (103-179)/(54-82) 103/54 (05/22 1138) SpO2:  [95 %-97 %] 95 % (05/22 0818) Last BM Date:  (pta)  Intake/Output from previous day: 05/21 0701 - 05/22 0700 In: 240 [P.O.:240] Out: 500 [Urine:500] Intake/Output this shift: No intake/output data recorded.  PE: General: pleasant, frail, female who is sitting up in bed in NAD HEENT: head is normocephalic, atraumatic.   Heart: regular rate and rhythm. Palpable radial and pedal pulses bilaterally Lungs: CTAB. Respiratory effort nonlabored on 3 lpm O2 via [place].  Abd: soft, NT, ND, +BS MS: all 4 extremities are symmetrical with no cyanosis, clubbing, or edema. Diffuse ecchymosis to right ribs and flank Skin: warm and dry Psych: A&Ox3 with an appropriate affect.    Lab Results:  Recent Labs    05/15/21 0241 05/16/21 0258  WBC 6.4 6.4  HGB 10.6* 10.4*  HCT 32.0* 30.7*  PLT 297 314   BMET Recent Labs    05/15/21 0241 05/16/21 0258  NA 131* 132*  K 3.7 3.9  CL 94* 95*  CO2 30 32  GLUCOSE 101* 108*  BUN 22 20  CREATININE 0.75 0.62  CALCIUM 9.4 9.0   PT/INR No results for input(s): LABPROT, INR in the last 72 hours. CMP     Component Value Date/Time   NA 132 (L) 05/16/2021 0258   K 3.9 05/16/2021 0258   CL 95 (L) 05/16/2021 0258   CO2 32 05/16/2021 0258   GLUCOSE 108 (H) 05/16/2021 0258   BUN 20 05/16/2021 0258   CREATININE 0.62 05/16/2021 0258   CALCIUM 9.0 05/16/2021 0258   PROT 6.7  05/11/2021 0035   ALBUMIN 3.6 05/11/2021 0035   AST 25 05/11/2021 0035   ALT 13 05/11/2021 0035   ALKPHOS 54 05/11/2021 0035   BILITOT 0.5 05/11/2021 0035   GFRNONAA >60 05/16/2021 0258   Lipase  No results found for: LIPASE     Studies/Results: DG CHEST PORT 1 VIEW  Result Date: 05/15/2021 CLINICAL DATA:  Chest tube removed, pneumothorax EXAM: PORTABLE CHEST 1 VIEW COMPARISON:  05/15/2021 FINDINGS: Interval removal of right chest tube. No visible pneumothorax. Moderate right pleural effusion with right lower lobe airspace opacities, unchanged. Left lung clear. Heart is normal size. IMPRESSION: Interval removal of right chest tube without visible pneumothorax. Continued moderate right effusion with right lower lobe atelectasis or infiltrate. Electronically Signed   By: Rolm Baptise M.D.   On: 05/15/2021 15:52   DG CHEST PORT 1 VIEW  Result Date: 05/15/2021 CLINICAL DATA:  Pneumothorax. EXAM: PORTABLE CHEST 1 VIEW COMPARISON:  May 14, 2021 FINDINGS: The right-sided chest tube is stable. A small right apical pneumothorax is stable. Effusion with underlying opacity on the right is stable. The cardiomediastinal silhouette is unchanged. The left lung remains well aerated. No other changes. IMPRESSION: 1. Stable right chest tube and small right apical pneumothorax. Stable right pleural effusion with underlying opacity, likely atelectasis. Electronically Signed   By: Dorise Bullion III M.D  On: 05/15/2021 08:11    Anti-infectives: Anti-infectives (From admission, onward)   None       Assessment/Plan GLF  Segmental R rib FX 6-10 with PTX and traumatic pneumatoceles - CXR no PTX. CT out 5/21 Acute hypercarbic respiratory failure - tolerated extubation 5/17, pulm toilet. Wean O2 R Pleural effusion - monitor, pulm toilet COPD - duoneb PRN HTN - home losartan Hyponatremia - UOP 1,100 mL. Continue oral repletion  FEN - SLP for swallow eval. Fulls, ensure, SLIV VTE - Lovenox  Dispo -  PT/OT. SLP for swall eval. Recommending 24H supervision. She states she has son and daughter who may be able to help with her care    LOS: 5 days    Winferd Humphrey, [facility] 05/16/2021, 12:33 PM Please see Amion for pager number during day hours 7:00am-4:30pm

## 2021-05-16 NOTE — TOC Initial Note (Addendum)
Transition of Care [facility]) - Initial/Assessment Note    Patient Details  Name: Melanie [name] MRN: [id number] Date of Birth: [birth year]  Transition of Care [facility]) CM/SW Contact:    Bartholomew Crews, RN Phone Number: [phone] 05/16/2021, 5:56 PM  Clinical Narrative:                  Spoke with patient and daughter, Lorriane Shire, at bedside and grandson, [name], on speaker phone to discuss transition plans. PTA home alone. Patient has family to assist with 24/7 care after discharge.   Discussed DME needs - asking for hospital bed, wheelchair, RW, and 3N1. Will follow for potential oxygen needs. Referral placed to [place]. Family to also work on getting ramp installed for home entry and grab bars installed in bathroom.   Patient's home location address is [address], [place], [place] [address].   Family is considering option of patient staying with a family member for short term.   PCP verified.   Discussed recommendations for [facility]. Patient has used a home health agency previous - ?may be still active. Joe to check with PCP office tomorrow to inquire which agency was used. Patient and family would like to use same agency.   May need ambulance transportation home at time of discharge - will follow for need.   TOC following for transition needs.   Expected Discharge Plan: [place] Barriers to Discharge: Continued Medical Work up   Patient Goals and CMS Choice Patient states their goals for this hospitalization and ongoing recovery are:: return home with family support CMS Medicare.gov Compare Post Acute Care list provided to:: Patient Choice offered to / list presented to : [place]  Expected Discharge Plan and Services Expected Discharge Plan: [place]   Discharge Planning Services: CM Consult Post Acute Care Choice: Home Health,Durable Medical Equipment Living arrangements for the past 2 months: Single Family Home                 DME  Arranged: 3-N-1,Walker rolling,Wheelchair manual,Hospital bed DME Agency: Other - Comment Physicist, medical) Date DME Agency Contacted: 05/16/21 Time DME Agency Contacted: 1027 Representative spoke with at DME Agency: Brenton [facility] HH Arranged: RN,PT,OT,Nurse's Aide          Prior Living Arrangements/Services Living arrangements for the past 2 months: [place] with:: Self Patient language and need for interpreter reviewed:: Yes Do you feel safe going back to the place where you live?: Yes      Need for Family Participation in Patient Care: Yes (Comment) Care giver support system in place?: Yes (comment)   Criminal Activity/Legal Involvement Pertinent to Current Situation/Hospitalization: No - Comment as needed  Activities of Daily Living      Permission Sought/Granted Permission sought to share information with : Family Supports    Share Information with NAME: Nelta Numbers     Permission granted to share info w Relationship: grandson/POA  Permission granted to share info w Contact Information: [phone]  Emotional Assessment Appearance:: Appears stated age Attitude/Demeanor/Rapport: Engaged Affect (typically observed): Accepting Orientation: : Oriented to Self,Oriented to  Time,Oriented to Place,Oriented to Situation Alcohol / Substance Use: Not Applicable Psych Involvement: No (comment)  Admission diagnosis:  Trauma [T14.90XA] Hemopneumothorax on right [J94.2] Chest tube in place [Z96.89] Closed fracture of multiple ribs of right side, initial encounter [S22.41XA] Closed fracture of multiple ribs with flail chest [S22.5XXA] Patient Active Problem List   Diagnosis Date Noted  . Closed fracture of  multiple ribs with flail chest 05/11/2021   PCP:  Martin[place], Betty G, MD Pharmacy:   [facility] [place], [place] - [address] [place] [address] [place] [address] [place] [address] Phone: [phone] Fax:  [phone]     Social Determinants of Health (SDOH) Interventions    Readmission Risk Interventions No flowsheet data found.

## 2021-05-16 NOTE — Progress Notes (Cosign Needed)
    Durable Medical Equipment  (From admission, onward)         Start     Ordered   05/16/21 1748  For home use only DME Walker rolling  Once       Question Answer Comment  Walker: With [place] Wheels   Patient needs a walker to treat with the following condition Decreased functional mobility and endurance      05/16/21 1749   05/16/21 1748  For home use only DME 3 n 1  Once        05/16/21 1749   05/16/21 1748  For home use only DME lightweight manual wheelchair with seat cushion  Once       Comments: Patient suffers from closed fractures, ribs and flail chest and copd which impairs their ability to perform daily activities like ADLs in the home.  A RW will not resolve  issue with performing activities of daily living. A wheelchair will allow patient to safely perform daily activities. Patient is not able to propel themselves in the home using a standard weight wheelchair due to weakness. Patient can self propel in the lightweight wheelchair. Length of need 6 months. Accessories: elevating leg rests (ELRs), wheel locks, extensions and anti-tippers.   05/16/21 1749   05/16/21 1747  For home use only DME Hospital bed  Once       Question Answer Comment  Length of Need 6 Months   Patient has (list medical condition): closed fractures, ribs - flail chest - copd   The above medical condition requires: Patient requires the ability to reposition frequently   Head must be elevated greater than: 30 degrees   Bed type Semi-electric   Support Surface: Gel Overlay      05/16/21 1749

## 2021-05-16 NOTE — Progress Notes (Signed)
Physical Therapy Treatment Patient Details Name: Melanie [name] MRN: [id number] DOB: 07-31-1941 Today's Date: 05/16/2021    History of Present Illness Pt is an 80 y.o. female who presented 5/17 as a level 1 trauma due to right chest wall crepitus, hypoxia, and HTN following falling out of bed onto her R side. Pt found to have R hemopneumothorax and R rib fxs 6-10. R chest tube placed 5/17-5/21. ETT 5/17-5/17. PMH: COPD.    PT Comments    Pt progressing well with PT, needing only min guard-supervision for bed mobility with simulated home set-up this date. Pt also progressing to min guard-minA for transfers and short bedroom distance mobility with a RW. Pt demonstrates poor RW management with gait, needing cues to remain proximal to it throughout. Pt limited this date by her increased HR and decreased SpO2 levels, primarily beginning and occurring when straining to have a bowel movement, RN aware. Pt educated on pursed lip breathing, which did improve her vitals. Pt with dizziness when coming to stand from toilet after bowel movement with noted desat, thus pt returned to sit and recliner brought to her for transfer from toilet to maintain her safety. Will continue to follow acutely. Current recommendations remain appropriate.   Follow Up Recommendations  Home health PT;Supervision/Assistance - 24 hour     Equipment Recommendations  Rolling walker with 5" wheels    Recommendations for Other Services       Precautions / Restrictions Precautions Precautions: Fall Precaution Comments: monitor sats, HOH Restrictions Weight Bearing Restrictions: No    Mobility  Bed Mobility Overal bed mobility: Needs Assistance Bed Mobility: Rolling;Sidelying to Sit Rolling: Supervision Sidelying to sit: Min guard       General bed mobility comments: Bed flat and rails down to simulate home, pt rolling to R with supervision. Extra time to ascend trunk to sit with min guard for safety.     Transfers Overall transfer level: Needs assistance Equipment used: Rolling walker (2 wheeled);1 person hand held assist Transfers: Sit to/from [facility] Sit to Stand: Min assist;Min guard Stand pivot transfers: Min assist       General transfer comment: Bed at low level, needing cues to push up from bed rather than pull up on RW. Pt rocking and powering up to stand with minA for steadying. Sit > stand 2x from toilet with min guard using wall rail, but pt became dizzy on 1st stand so returned to toilet,t hen retrieved recliner for stand pivot with minA due to desat and symptoms.  Ambulation/Gait Ambulation/Gait assistance: Min guard;Min assist Gait Distance (Feet): 15 Feet (x2 bouts of ~15 ft > ~2 ft) Assistive device: Rolling walker (2 wheeled) Gait Pattern/deviations: Step-through pattern;Decreased stride length;Trunk flexed Gait velocity: reduced Gait velocity interpretation: <1.31 ft/sec, indicative of household ambulator General Gait Details: Pt taking small steps with RW, needing cues to remain proximal to it throughout. Poor posture. Min guard-A for stability. No overt LOB.   Stairs             Wheelchair Mobility    Modified Rankin (Stroke Patients Only) Modified Rankin (Stroke Patients Only) Pre-Morbid Rankin Score: No symptoms Modified Rankin: Moderately severe disability     Balance Overall balance assessment: Needs assistance Sitting-balance support: No upper extremity supported;Feet supported Sitting balance-Leahy Scale: Fair Sitting balance - Comments: Min guard for safety.   Standing balance support: Bilateral upper extremity supported Standing balance-Leahy Scale: Poor Standing balance comment: Reliant on bil UE support and external assist.  Cognition Arousal/Alertness: Awake/alert Behavior During Therapy: WFL for tasks assessed/performed Overall Cognitive Status: Within Functional Limits for  tasks assessed                                 General Comments: Pt did ask where the chest tube was previously. Much improved cognition with improved processing speed.      Exercises      General Comments General comments (skin integrity, edema, etc.): HR up to 150 for brief moment then would return to 110-120s while having bowel movement, 120-130s initially with first gait bout; desat to 69% (not consistent waveform noted) on 1-2L O2 via [place] when having bowel movement then would jump up to 89% quickly again and up into 90s%, increased O2 to 3-4 L with O2 79%-100% during bowel movement, sats 80-90s% with first gait bout, desat to high 70s% with second attempt at gait bout with pt reporting feeling dizzy thus returned to sit and retrieved recliner for stand pivot transfer from toilet for safety purposes; pt denied dizziness or lightheadedness all other times throughout session      Pertinent Vitals/Pain Pain Assessment: No/denies pain Pain Intervention(s): Monitored during session    Home Living                      Prior Function            PT Goals (current goals can now be found in the care plan section) Acute Rehab PT Goals Patient Stated Goal: to have privacy for bowel movement PT Goal Formulation: With patient/family Time For Goal Achievement: 05/26/21 Potential to Achieve Goals: Good Progress towards PT goals: Progressing toward goals    Frequency    Min 3X/week      PT Plan Current plan remains appropriate    Co-evaluation              AM-PAC PT "6 Clicks" Mobility   Outcome Measure  Help needed turning from your back to your side while in a flat bed without using bedrails?: A Little Help needed moving from lying on your back to sitting on the side of a flat bed without using bedrails?: A Little Help needed moving to and from a bed to a chair (including a wheelchair)?: A Little Help needed standing up from a chair using your arms  (e.g., wheelchair or bedside chair)?: A Little Help needed to walk in hospital room?: A Little Help needed climbing 3-5 steps with a railing? : A Lot 6 Click Score: 17    End of Session Equipment Utilized During Treatment: Gait belt;Oxygen Activity Tolerance: Patient limited by fatigue;Other (comment) (limited by HR and sats) Patient left: with call bell/phone within reach;in chair;with chair alarm set Nurse Communication: Mobility status;Other (comment) (sats, HR, dizziness 1x during session, bowel movement) PT Visit Diagnosis: Unsteadiness on feet (R26.81);Other abnormalities of gait and mobility (R26.89);Muscle weakness (generalized) (M62.81);History of falling (Z91.81);Difficulty in walking, not elsewhere classified (R26.2);Pain     Time: 1029-1110 PT Time Calculation (min) (ACUTE ONLY): 41 min  Charges:  $Gait Training: 8-22 mins $Therapeutic Activity: 23-37 mins                     Moishe Spice, PT, DPT Acute Rehabilitation Services  Pager: [phone] Office: [place] 05/16/2021, 11:17 AM

## 2021-05-16 NOTE — Progress Notes (Addendum)
Tele called RN to pt room for increased HR in 150's and dropping O2 sats. Pt was found in bathroom with PT nearby. Pt straining to have BM and also seems a little anxious, O2 fluctuates between low 80's-97. Pt instructed to breathe through her nose and take a deep breaths, and her O2 levels come back up. As pt strains to have BM, her HR increases. Pt not in distress otherwise, will closely monitor.  Pt back to chair, resting, on 3L O2. Sats in 90's, HR still elevated in 120-130's. Paged on-call provider. PRN metoprolol given. BP stable.

## 2021-05-17 ENCOUNTER — Inpatient Hospital Stay (HOSPITAL_COMMUNITY): Payer: Medicare Other

## 2021-05-17 HISTORY — PX: IR THORACENTESIS ASP PLEURAL SPACE W/IMG GUIDE: IMG5380

## 2021-05-17 LAB — BASIC METABOLIC PANEL
Anion gap: 7 (ref 5–15)
BUN: 17 mg/dL (ref 8–23)
CO2: 32 mmol/L (ref 22–32)
Calcium: 9.3 mg/dL (ref 8.9–10.3)
Chloride: 96 mmol/L — ABNORMAL LOW (ref 98–111)
Creatinine, Ser: 0.69 mg/dL (ref 0.44–1.00)
GFR, Estimated: >60 mL/min (ref >60)
Glucose, Bld: 106 mg/dL — ABNORMAL HIGH (ref 70–99)
Potassium: 3.3 mmol/L — ABNORMAL LOW (ref 3.5–5.1)
Sodium: 135 mmol/L (ref 135–145)

## 2021-05-17 MED ORDER — METOPROLOL TARTRATE 12.5 MG HALF TABLET
12.5000 mg | ORAL_TABLET | Freq: Two times a day (BID) | ORAL | Status: DC
  Administered 2021-05-17: 12.5 mg via ORAL
  Filled 2021-05-17: qty 1

## 2021-05-17 MED ORDER — LORAZEPAM 2 MG/ML IJ SOLN
0.5000 mg | Freq: Once | INTRAMUSCULAR | Status: AC | PRN
  Administered 2021-05-17: 0.5 mg via INTRAVENOUS
  Filled 2021-05-17: qty 1

## 2021-05-17 MED ORDER — DILTIAZEM HCL 60 MG PO TABS
30.0000 mg | ORAL_TABLET | Freq: Four times a day (QID) | ORAL | Status: DC
  Administered 2021-05-17 – 2021-05-20 (×13): 30 mg via ORAL
  Filled 2021-05-17 (×13): qty 1

## 2021-05-17 MED ORDER — LIDOCAINE HCL (PF) 1 % IJ SOLN
INTRAMUSCULAR | Status: AC
  Filled 2021-05-17: qty 30

## 2021-05-17 MED ORDER — MORPHINE SULFATE (PF) 2 MG/ML IV SOLN
1.0000 mg | INTRAVENOUS | Status: DC | PRN
  Administered 2021-05-17 – 2021-05-18 (×3): 2 mg via INTRAVENOUS
  Filled 2021-05-17 (×3): qty 1

## 2021-05-17 MED ORDER — POLYETHYLENE GLYCOL 3350 17 G PO PACK
17.0000 g | PACK | Freq: Every day | ORAL | Status: DC
  Filled 2021-05-17 (×2): qty 1

## 2021-05-17 MED ORDER — POTASSIUM CHLORIDE 20 MEQ PO PACK
40.0000 meq | PACK | ORAL | Status: AC
  Administered 2021-05-17 (×2): 40 meq via ORAL
  Filled 2021-05-17 (×2): qty 2

## 2021-05-17 MED ORDER — FUROSEMIDE 10 MG/ML IJ SOLN
40.0000 mg | Freq: Once | INTRAMUSCULAR | Status: AC
  Administered 2021-05-17: 40 mg via INTRAVENOUS
  Filled 2021-05-17: qty 4

## 2021-05-17 MED ORDER — ACETAMINOPHEN 500 MG PO TABS
1000.0000 mg | ORAL_TABLET | Freq: Three times a day (TID) | ORAL | Status: DC
  Administered 2021-05-17 – 2021-05-20 (×9): 1000 mg via ORAL
  Filled 2021-05-17 (×9): qty 2

## 2021-05-17 MED ORDER — LIDOCAINE HCL 1 % IJ SOLN
INTRAMUSCULAR | Status: DC | PRN
  Administered 2021-05-17: 10 mL

## 2021-05-17 MED ORDER — SODIUM CHLORIDE 1 G PO TABS
1.0000 g | ORAL_TABLET | Freq: Three times a day (TID) | ORAL | Status: DC
  Administered 2021-05-17: 1 g via ORAL
  Filled 2021-05-17: qty 1

## 2021-05-17 MED ORDER — METOPROLOL TARTRATE 50 MG PO TABS
50.0000 mg | ORAL_TABLET | Freq: Two times a day (BID) | ORAL | Status: DC
  Administered 2021-05-17 – 2021-05-20 (×6): 50 mg via ORAL
  Filled 2021-05-17 (×6): qty 1

## 2021-05-17 NOTE — Progress Notes (Signed)
Physical Therapy Treatment Patient Details Name: Melanie [name] MRN: [id number] DOB: [birth year] Today's Date: 05/17/2021    History of Present Illness Pt is an 80 y.o. female who presented 5/17 as a level 1 trauma due to right chest wall crepitus, hypoxia, and HTN following falling out of bed onto her R side. Pt found to have R hemopneumothorax and R rib fxs 6-10. R chest tube placed 5/17-5/21. ETT 5/17-5/17. PMH: COPD.    PT Comments    Pt received up in chair; able to perform seated warm up exercises for BLE strengthening and transfer to and from a bedside commode with a walker and up to min assist. HR 114-134 bpm, desaturation to 83% on 3-4L O2 (unreliable pleth). Unfortunately, further ambulation limited in setting of frequent urinary incontinence (pt recently given Lasix). Will continue to progress as tolerated.     Follow Up Recommendations  Home health PT;Supervision/Assistance - 24 hour     Equipment Recommendations  Rolling walker with 5" wheels;3in1 (PT);Wheelchair (measurements PT);Wheelchair cushion (measurements PT)    Recommendations for Other Services       Precautions / Restrictions Precautions Precautions: Fall Precaution Comments: monitor sats, HR Restrictions Weight Bearing Restrictions: No    Mobility  Bed Mobility               General bed mobility comments: OOB in chair    Transfers Overall transfer level: Needs assistance Equipment used: Rolling walker (2 wheeled);1 person hand held assist Transfers: Sit to/from [employer] Transfers Sit to Stand: Min assist;Min guard Stand pivot transfers: Min guard       General transfer comment: Cues for hand placement, minG-minA to rise from chair and steady x 2, taking pivotal steps to and from [facility]  Ambulation/Gait                 Stairs             Wheelchair Mobility    Modified Rankin (Stroke Patients Only)       Balance Overall balance assessment: Needs  assistance Sitting-balance support: No upper extremity supported;Feet supported Sitting balance-Leahy Scale: Fair     Standing balance support: Bilateral upper extremity supported Standing balance-Leahy Scale: Poor Standing balance comment: Reliant on bil UE support and external assist.                            Cognition Arousal/Alertness: Awake/alert Behavior During Therapy: WFL for tasks assessed/performed Overall Cognitive Status: Within Functional Limits for tasks assessed                                 General Comments: WFL for basic mobility, internally distracted by pain      Exercises General Exercises - Lower Extremity Long Arc Quad: Both;10 reps;Seated Hip Flexion/Marching: Both;10 reps;Seated    General Comments        Pertinent Vitals/Pain Pain Assessment: Faces Faces Pain Scale: Hurts even more Pain Location: R side Pain Descriptors / Indicators: Discomfort;Grimacing;Guarding Pain Intervention(s): Limited activity within patient's tolerance;Monitored during session    Home Living                      Prior Function            PT Goals (current goals can now be found in the care plan section) Acute Rehab PT Goals Patient Stated Goal: less pain  PT Goal Formulation: With patient/family Time For Goal Achievement: 05/26/21 Potential to Achieve Goals: Good Progress towards PT goals: Progressing toward goals    Frequency    Min 3X/week      PT Plan Current plan remains appropriate    Co-evaluation              AM-PAC PT "6 Clicks" Mobility   Outcome Measure  Help needed turning from your back to your side while in a flat bed without using bedrails?: A Little Help needed moving from lying on your back to sitting on the side of a flat bed without using bedrails?: A Little Help needed moving to and from a bed to a chair (including a wheelchair)?: A Little Help needed standing up from a chair using your  arms (e.g., wheelchair or bedside chair)?: A Little Help needed to walk in hospital room?: A Little Help needed climbing 3-5 steps with a railing? : A Lot 6 Click Score: 17    End of Session Equipment Utilized During Treatment: Gait belt;Oxygen Activity Tolerance: Patient limited by fatigue;Other (comment) (urinary incontinence) Patient left: with call bell/phone within reach;in chair;with chair alarm set Nurse Communication: Mobility status;Other (comment) (urinary incontinence) PT Visit Diagnosis: Unsteadiness on feet (R26.81);Other abnormalities of gait and mobility (R26.89);Muscle weakness (generalized) (M62.81);History of falling (Z91.81);Difficulty in walking, not elsewhere classified (R26.2);Pain     Time: 1222-1252 PT Time Calculation (min) (ACUTE ONLY): 30 min  Charges:  $Therapeutic Activity: 23-37 mins                     Wyona Almas, PT, DPT Acute Rehabilitation Services Pager [phone] Office [phone]    Melanie [name] 05/17/2021, 2:44 PM

## 2021-05-17 NOTE — Progress Notes (Signed)
  Speech Language Pathology   Patient Details Name: Melanie [name] MRN: [id number] DOB: 03/02/1941 Today's Date: 05/17/2021 Time:  -     Received swallow assessment yesterday. Pt on a diet. Head CT negative for acute and therapist questioned need for assessment today. Also noted PA-C note also questioned and planned to clarify reason with team. RN has not noted difficulty with swallow but poor appetite, possibly failure to thrive. At this time ST will sign off. Please reconsult if needed.                       Houston Siren 05/17/2021, 3:19 PM Orbie Pyo Colvin Caroli.Ed Risk analyst [phone] Office [phone]

## 2021-05-17 NOTE — Progress Notes (Signed)
Occupational Therapy Treatment Patient Details Name: Melanie [name] MRN: [id number] DOB: [birth year] Today's Date: 05/17/2021    History of present illness Pt is an 80 y.o. female who presented 5/17 as a level 1 trauma due to right chest wall crepitus, hypoxia, and HTN following falling out of bed onto her R side. Pt found to have R hemopneumothorax and R rib fxs 6-10. R chest tube placed 5/17-5/21. ETT 5/17-5/17. PMH: COPD.   OT comments  Pt progressing well. Pt following commandsand performing grooming task seated at EOB and dinner rolled in so pt wanting to eat; teeth cleaned prior and pt set-up for meal. HR to 130s; O2 on 3L >88% with exertion. Pt continues to have cognitive processing deficits, but pt appeared more clear and following commands with slight delay today. Pt would benefit from continued OT skilled services. OT following acutely.   Follow Up Recommendations  Home health OT;Supervision/Assistance - 24 hour    Equipment Recommendations  3 in 1 bedside commode;Wheelchair (measurements OT);Wheelchair cushion (measurements OT)    Recommendations for Other Services      Precautions / Restrictions Precautions Precautions: Fall Precaution Comments: monitor sats, HR Restrictions Weight Bearing Restrictions: No       Mobility Bed Mobility Overal bed mobility: Needs Assistance Bed Mobility: Supine to Sit;Sit to Supine     Supine to sit: Min assist Sit to supine: Min assist        Transfers Overall transfer level: Needs assistance Equipment used: Rolling walker (2 wheeled);1 person hand held assist Transfers: Sit to/from [facility] Sit to Stand: Min assist;Min guard Stand pivot transfers: Min guard       General transfer comment: cues for hand placement; minA for support- taking steps to [facility]    Balance Overall balance assessment: Needs assistance Sitting-balance support: No upper extremity supported;Feet supported Sitting balance-Leahy  Scale: Fair     Standing balance support: Bilateral upper extremity supported Standing balance-Leahy Scale: Poor Standing balance comment: Reliant on bil UE support and external assist.                           ADL either performed or assessed with clinical judgement   ADL Overall ADL's : Needs assistance/impaired Eating/Feeding: Set up;Sitting Eating/Feeding Details (indicate cue type and reason): supported in bed for task Grooming: Set up;Sitting;Wash/dry hands;Wash/dry face;Oral care Grooming Details (indicate cue type and reason): assist for set-upA only. Pt using basin for cleaning dentures.                             Functional mobility during ADLs: Minimal assistance;Rolling walker General ADL Comments: Pt following commandsand performing grooming task seated at EOB and dinner rolled in so pt wanting to eat; teeth cleaned prior and pt set-up for meal.     Vision       Perception     Praxis      Cognition Arousal/Alertness: Awake/alert Behavior During Therapy: WFL for tasks assessed/performed Overall Cognitive Status: Impaired/Different from baseline Area of Impairment: Memory;Problem solving                     Memory: Decreased short-term memory       Problem Solving: Slow processing;Decreased initiation;Difficulty sequencing;Requires verbal cues;Requires tactile cues General Comments: pt following commands, but requires sequencing cues and "I forget what I am doing."        Exercises  Shoulder Instructions       General Comments HR to 130s; O2 on 3L >88% with exertion.    Pertinent Vitals/ Pain       Pain Assessment: Faces Faces Pain Scale: Hurts even more Pain Location: R side Pain Descriptors / Indicators: Discomfort;Grimacing;Guarding Pain Intervention(s): Monitored during session;Limited activity within patient's tolerance  Home Living                                          Prior  Functioning/Environment              Frequency  Min 2X/week        Progress Toward Goals  OT Goals(current goals can now be found in the care plan section)  Progress towards OT goals: Progressing toward goals  Acute Rehab OT Goals Patient Stated Goal: less pain OT Goal Formulation: With patient Time For Goal Achievement: 05/26/21 Potential to Achieve Goals: Good ADL Goals Pt Will Perform Lower Body Dressing: with supervision;sit to/from stand Pt Will Transfer to Toilet: with min guard assist;ambulating;bedside commode Pt Will Perform Toileting - Clothing Manipulation and hygiene: with min guard assist;sitting/lateral leans;sit to/from stand Additional ADL Goal #1: Pt will complete (3) multi-step commands with minimal cues to attend to task.  Plan Discharge plan remains appropriate    Co-evaluation                 AM-PAC OT "6 Clicks" Daily Activity     Outcome Measure   Help from another person eating meals?: A Little Help from another person taking care of personal grooming?: A Little Help from another person toileting, which includes using toliet, bedpan, or urinal?: Total Help from another person bathing (including washing, rinsing, drying)?: A Lot Help from another person to put on and taking off regular upper body clothing?: A Little Help from another person to put on and taking off regular lower body clothing?: A Lot 6 Click Score: 14    End of Session Equipment Utilized During Treatment: Oxygen  OT Visit Diagnosis: Unsteadiness on feet (R26.81);Pain;Repeated falls (R29.6) Pain - Right/Left: Right Pain - part of body:  (side)   Activity Tolerance Patient tolerated treatment well   Patient Left in bed;with call bell/phone within reach;with bed alarm set   Nurse Communication Mobility status        Time: 9518-8416 OT Time Calculation (min): 32 min  Charges: OT General Charges $OT Visit: 1 Visit OT Treatments $Self Care/Home Management :  8-22 mins $Therapeutic Activity: 8-22 mins  Jefferey Pica, OTR/L Acute Rehabilitation Services Pager: [phone] Office: [place] 05/17/2021, 6:46 PM

## 2021-05-17 NOTE — Consult Note (Addendum)
CARDIOLOGY CONSULT NOTE  Patient ID: ALESA ECHEVARRIA MRN: [id number] DOB/AGE: Sep 22, 1941 80 y.o.  Admit date: 05/11/2021 Referring Physician  Georganna Skeans, MD Primary Physician:  [place], Betty G, MD Reason for Consultation  Atrial fibillation Admit date: 05/11/2021   Patient ID: SAPHIA VANDERFORD, female    DOB: 12-04-1941, 80 y.o.   MRN: [id number]  Chief Complaint  Patient presents with  . Trauma   HPI:    SHAI RASMUSSEN  is a 80 y.o. female patient referred to me for evaluation and management of new onset of atrial fibrillation.  Patient has no significant cardiovascular history except for primary hypertension was admitted to the hospital on 05/11/2021 after she fell off of her bed and broke several ribs and has a flail chest on the right.  Patient then developed new onset A. fib with RVR.  Except for persistent chest pain on the right, she has no other specific complaints today.  Past Medical History:  Diagnosis Date  . COPD (chronic obstructive pulmonary disease) ([place])    History reviewed. No pertinent surgical history. Social History   Tobacco Use  . Smoking status: Not on file  . Smokeless tobacco: Not on file  Substance Use Topics  . Alcohol use: Not on file    History reviewed. No pertinent family history.  Marital Sttus: Married  ROS  Review of Systems  Cardiovascular: Positive for chest pain and dyspnea on exertion. Negative for leg swelling.  Gastrointestinal: Negative for melena.  All other systems reviewed and are negative.  Objective   Vitals with BMI 05/17/2021 05/17/2021 05/17/2021  Height - - -  Weight - - -  BMI - - -  Systolic 979 892 119  Diastolic 67 98 70  Pulse 90 118 92    Blood pressure (!) 149/67, pulse 90, temperature 97.7 F (36.5 C), temperature source Oral, resp. rate 18, height 5\' 8"  (1.727 m), weight 61.2 kg, SpO2 91 %.    Physical Exam Constitutional:      General: She is not in acute distress.    Appearance: She is normal  weight.  Eyes:     Extraocular Movements: Extraocular movements intact.  Cardiovascular:     Rate and Rhythm: Rhythm irregular.     Pulses: Normal pulses.     Heart sounds: No murmur heard.   Pulmonary:     Effort: Pulmonary effort is normal.     Breath sounds: Rhonchi (diffuse bilateral Left >> Right) present.     Comments: Bronchial breath sounds right thorax Chest:     Chest wall: Tenderness present.  Abdominal:     General: Abdomen is flat.     Palpations: Abdomen is soft.  Musculoskeletal:        General: No swelling. Normal range of motion.     Cervical back: Normal range of motion and neck supple.  Skin:    General: Skin is warm.  Neurological:     General: No focal deficit present.     Mental Status: She is alert and oriented to person, place, and time.    Laboratory examination:    Recent Labs    05/15/21 0241 05/16/21 0258 05/17/21 0921  NA 131* 132* 135  K 3.7 3.9 3.3*  CL 94* 95* 96*  CO2 30 32 32  GLUCOSE 101* 108* 106*  BUN 22 20 17   CREATININE 0.75 0.62 0.69  CALCIUM 9.4 9.0 9.3  GFRNONAA >60 >60 >60   estimated creatinine clearance is 53.3 mL/min (by C-G  formula based on SCr of 0.69 mg/dL).  CMP Latest Ref Rng & Units 05/17/2021 05/16/2021 05/15/2021  Glucose 70 - 99 mg/dL 106(H) 108(H) 101(H)  BUN 8 - 23 mg/dL 17 20 22   Creatinine 0.44 - 1.00 mg/dL 0.69 0.62 0.75  Sodium 135 - 145 mmol/L 135 132(L) 131(L)  Potassium 3.5 - 5.1 mmol/L 3.3(L) 3.9 3.7  Chloride 98 - 111 mmol/L 96(L) 95(L) 94(L)  CO2 22 - 32 mmol/L 32 32 30  Calcium 8.9 - 10.3 mg/dL 9.3 9.0 9.4  Total Protein 6.5 - 8.1 g/dL - - -  Total Bilirubin 0.3 - 1.2 mg/dL - - -  Alkaline Phos 38 - 126 U/L - - -  AST 15 - 41 U/L - - -  ALT 0 - 44 U/L - - -   CBC Latest Ref Rng & Units 05/16/2021 05/15/2021 05/14/2021  WBC 4.0 - 10.5 K/uL 6.4 6.4 8.4  Hemoglobin 12.0 - 15.0 g/dL 10.4(L) 10.6(L) 10.7(L)  Hematocrit 36.0 - 46.0 % 30.7(L) 32.0(L) 31.8(L)  Platelets 150 - 400 K/uL 314 297 233    Lipid Panel No results for input(s): CHOL, TRIG, LDLCALC, VLDL, HDL, CHOLHDL, LDLDIRECT in the last 8760 hours.  HEMOGLOBIN A1C No results found for: HGBA1C, MPG TSH No results for input(s): TSH in the last 8760 hours. BNP (last 3 results)   Medications and allergies  No Known Allergies   Current Meds  Medication Sig  . CALCIUM PO Take 1 tablet by mouth daily.  . Ferrous Sulfate (IRON PO) Take 1 tablet by mouth daily.  . fexofenadine (ALLEGRA) 180 MG tablet Take 180 mg by mouth daily.  Marland Kitchen LORazepam (ATIVAN) 0.5 MG tablet Take 0.5 mg by mouth 2 (two) times daily as needed for anxiety.  Marland Kitchen losartan (COZAAR) 100 MG tablet Take 100 mg by mouth daily.  . pantoprazole (PROTONIX) 20 MG tablet Take 20 mg by mouth daily.  . Red Yeast Rice Extract (RED YEAST RICE PO) Take 1 tablet by mouth daily.  Marland Kitchen VITAMIN D PO Take 1 capsule by mouth daily.    Scheduled Meds: . acetaminophen  1,000 mg Oral Q8H  . chlorhexidine  15 mL Mouth Rinse BID  . Chlorhexidine Gluconate Cloth  6 each Topical Daily  . diltiazem  30 mg Oral Q6H  . docusate sodium  100 mg Oral BID  . enoxaparin (LOVENOX) injection  30 mg Subcutaneous Q12H  . feeding supplement  1 Container Oral TID BM  . lidocaine (PF)      . mouth rinse  15 mL Mouth Rinse q12n4p  . metoprolol tartrate  50 mg Oral BID  . pantoprazole  40 mg Oral Daily  . polyethylene glycol  17 g Oral Daily  . potassium chloride  40 mEq Oral Q4H   Continuous Infusions: PRN Meds:.bisacodyl, ipratropium-albuterol, methocarbamol, metoprolol tartrate, morphine injection, ondansetron **OR** ondansetron (ZOFRAN) IV, oxyCODONE   I/O last 3 completed shifts: In: 240 [P.O.:240] Out: 800 [Urine:800] Total I/O In: -  Out: [place] [Urine:1050]    Radiology:   DG CHEST PORT 1 VIEW  Result Date: 05/17/2021 CLINICAL DATA:  Shortness of breath EXAM: PORTABLE CHEST 1 VIEW COMPARISON:  05/16/2021 FINDINGS: Cardiac shadow is stable. Aortic calcifications are again seen.  Left lung is clear. Right sided pleural effusion is again noted with underlying atelectasis. The overall appearance is stable from the prior study. IMPRESSION: No change in right effusion and focal infiltrate/atelectasis. Electronically Signed   By: Inez Catalina M.D.   On: 05/17/2021 11:57  DG CHEST PORT 1 VIEW  Result Date: 05/16/2021 CLINICAL DATA:  Shortness of breath EXAM: PORTABLE CHEST 1 VIEW COMPARISON:  05/15/2021 FINDINGS: Moderate right pleural effusion with right lower lobe consolidation. Left lung clear. Heart is normal size. Aorta calcifications. No acute bony abnormality. No pneumothorax. IMPRESSION: Moderate right pleural effusion with right lower lobe infiltrate. No change. Electronically Signed   By: Rolm Baptise M.D.   On: 05/16/2021 14:14    Cardiac Studies:   None, Echo ordered  EKG:  EKG 05/11/2021: Sinus tachycardia at rate of 105 bpm, normal axis, no evidence of ischemia.  Borderline criteria for LVH by Edmonia Lynch criteria.  Baseline artifact.  EKG 05/14/2021: Atrial fibrillation with rapid ventricular response at rate of 107 bpm, no evidence of ischemia.  Assessment   1.  New onset of atrial fibrillation with rapid ventricular response, probably related to hemopneumothorax and flail chest involving the right chest wall after a mechanical fall.  CHA2DS2-VASc Score is 3.  Yearly risk of stroke: 2.3% (Age, HTN).  Score of 1=0.6; 2=2.2; 3=3.2; 4=4.8; 5=7.2; 6=9.8; 7=>9.8) -(CHF; HTN; vasc disease DM,  Female = 1; Age <65 =0; 65-74 = 1,  >75 =2; stroke/embolism= 2).    2. Primary hypertension   Recommendations:   I have discontinued patient's losartan for now, she is presently on very low-dose of metoprolol tartrate 12.5 mg twice daily.  We will increase it to 50 mg p.o. twice daily and I will also add diltiazem plain 30 mg every 6 hours as needed to be used for atrial fibrillation rate control.  Blood pressure has been well controlled.  I will continue to follow.  We will  obtain an echocardiogram to evaluate LV systolic function and also left atrial size.  Patient is not a candidate for anticoagulation in view of traumatic chest wall injury.  I will follow her up in the outpatient basis.   Adrian Prows, MD, [facility] 05/17/2021, 3:54 PM Office: [phone]

## 2021-05-17 NOTE — Procedures (Signed)
PROCEDURE SUMMARY:  Successful US guided right thoracentesis. Yielded 900 mL of bloody fluid. Pt tolerated procedure well. No immediate complications.  Specimen was not sent for labs. CXR ordered.  EBL < 5 mL  Docia Barrier PA-C 05/17/2021 3:47 PM

## 2021-05-17 NOTE — Telephone Encounter (Cosign Needed)
3 attempts

## 2021-05-17 NOTE — Progress Notes (Addendum)
Subjective: CC: sob Patient tachycardic between 120 and 140's this morning with A. fib rhythm on tele.  BP 143/98 on last BP cycle.  I have asked RN to give as needed metoprolol.  Patient on 3 L.  She complains of shortness of breath.  Only pulling 250 on I-S.  No flutter valve in room.  She reports she has been using I-S multiple times per day.  Afebrile overnight.  No cough.  Has some right-sided rib pain.  No other areas of pain.  She reports she ate most of her dinner yesterday without any abdominal pain, nausea or vomiting.  Last BM yesterday and normal. She has not had any breakfast this morning.  No urine output documented overnight and there is nothing in her canister this morning.  She denies any urine output overnight or this a.m.  I have asked the RN to [place] her.   Therapies currently recommending home health with 24/7 supervision.  Per TOC note her daughter and grandson will be able to provide supervision at discharge.  ROS: See above, otherwise other systems negative   Objective: Vital signs in last 24 hours: Temp:  [97.6 F (36.4 C)-97.8 F (36.6 C)] 97.7 F (36.5 C) (05/23 0847) Pulse Rate:  [90-141] 118 (05/23 0847) Resp:  [14-24] 22 (05/23 0847) BP: (103-169)/(54-98) 143/98 (05/23 0847) SpO2:  [92 %-99 %] 92 % (05/23 0847) Last BM Date: 05/16/21  Intake/Output from previous day: 05/22 0701 - 05/23 0700 In: -  Out: 500 [Urine:500] Intake/Output this shift: No intake/output data recorded.  PE: General: pleasant, WD/WN white female who is laying in bed in mild distress HEENT: head is normocephalic, atraumatic.  Sclera are noninjected.  PERRL.  Ears and nose without any masses or lesions.  Mouth is pink and moist. Dentition fair Heart: Irregular, irregular. Palpable radial and pedal pulses bilaterally  Lungs: Decreased breath sounds on the right side. Clear breath sounds to left lung fields. No wheezing. No crackles. On 3L. Tachypnea noted. No accessory  muscle use. Able to talk in full sentences.  Abd: Soft, NT/ND, +BS MS: No BUE/BLE edema, calves soft and nontender Skin: warm and dry with no masses, lesions, or rashes Psych: A&Ox4 with an appropriate affect  Lab Results:  Recent Labs    05/15/21 0241 05/16/21 0258  WBC 6.4 6.4  HGB 10.6* 10.4*  HCT 32.0* 30.7*  PLT 297 314   BMET Recent Labs    05/15/21 0241 05/16/21 0258  NA 131* 132*  K 3.7 3.9  CL 94* 95*  CO2 30 32  GLUCOSE 101* 108*  BUN 22 20  CREATININE 0.75 0.62  CALCIUM 9.4 9.0   PT/INR No results for input(s): LABPROT, INR in the last 72 hours. CMP     Component Value Date/Time   NA 132 (L) 05/16/2021 0258   K 3.9 05/16/2021 0258   CL 95 (L) 05/16/2021 0258   CO2 32 05/16/2021 0258   GLUCOSE 108 (H) 05/16/2021 0258   BUN 20 05/16/2021 0258   CREATININE 0.62 05/16/2021 0258   CALCIUM 9.0 05/16/2021 0258   PROT 6.7 05/11/2021 0035   ALBUMIN 3.6 05/11/2021 0035   AST 25 05/11/2021 0035   ALT 13 05/11/2021 0035   ALKPHOS 54 05/11/2021 0035   BILITOT 0.5 05/11/2021 0035   GFRNONAA >60 05/16/2021 0258   Lipase  No results found for: LIPASE     Studies/Results: DG CHEST PORT 1 VIEW  Result Date: 05/16/2021 CLINICAL DATA:  Shortness of breath EXAM: PORTABLE CHEST 1 VIEW COMPARISON:  05/15/2021 FINDINGS: Moderate right pleural effusion with right lower lobe consolidation. Left lung clear. Heart is normal size. Aorta calcifications. No acute bony abnormality. No pneumothorax. IMPRESSION: Moderate right pleural effusion with right lower lobe infiltrate. No change. Electronically Signed   By: Rolm Baptise M.D.   On: 05/16/2021 14:14   DG CHEST PORT 1 VIEW  Result Date: 05/15/2021 CLINICAL DATA:  Chest tube removed, pneumothorax EXAM: PORTABLE CHEST 1 VIEW COMPARISON:  05/15/2021 FINDINGS: Interval removal of right chest tube. No visible pneumothorax. Moderate right pleural effusion with right lower lobe airspace opacities, unchanged. Left lung clear.  Heart is normal size. IMPRESSION: Interval removal of right chest tube without visible pneumothorax. Continued moderate right effusion with right lower lobe atelectasis or infiltrate. Electronically Signed   By: Rolm Baptise M.D.   On: 05/15/2021 15:52    Anti-infectives: Anti-infectives (From admission, onward)   None       Assessment/Plan GLF Segmental R rib FX 6-10 with PTXand traumatic pneumatoceles- CT out 5/21. CXR 5/22 w/o PTX. PT/OT Acute hypercarbic respiratory failure- Extubated 5/17. On 3L. See below. R pleural effusion - CXR yesterday w/ R pleural effusion and possible infiltrate. She is afebrile and w/ normal wbc. Denies cough. Would hold off on abx for now. Repeating cxr today. Suspect she will need lasix but given no UOP in > 12 hours will bladder scan before given dose of lasix. Pulm toilet. Only pulling 250 on IS. Encourage IS 10x hour. Add flutter valve. On 3L. Does not wear o2 at home.  COPD- duoneb PRN. No wheezing on exam this am.  HTN- home losartan. PRN metoprolol A. Fib in RVR - Do not see this on hx of merged chart and patient denies hx. Patient tachycardic between 120 and 140's this morning with A. fib rhythm on tele.  BP 143/98 on last BP cycle.  I asked RN to give as needed metoprolol with good response in HR to low 100's. Monitor and consider cardiology consult. Will discuss with MD.  Hyponatremia - Oral repletion and repeat labs in AM Urinary retention? - No urine output documented overnight and there is nothing in her canister this morning.  She denies any urine output overnight or this a.m.  I have asked the RN to [place] her.  FEN- Fulls, ensure, SLIV, there is a swallow eval ordered. Will clarify with weekend team reasoning for swallow eval.  VTE- SCDs, Lovenox ID - None currently. Afebrile. WBC 6.4 Foley - None currently. Bladder scan Dispo- Workup as above. PT/OT. Recommending 24H supervision. Per TOC note her daughter and grandson will be able  to provide supervision at discharge.  Addendum - Patient voiding without difficulty. Thoracentesis ordered for R pleural effusion. Cards consulted paged this morning, this afternoon and I am about to page again. Awaiting call back. We have started on scheduled Metoprolol for A. Fib.    LOS: 6 days    Jillyn Ledger , [facility] 05/17/2021, 8:54 AM Please see Amion for pager number during day hours 7:00am-4:30pm

## 2021-05-18 ENCOUNTER — Inpatient Hospital Stay (HOSPITAL_COMMUNITY): Payer: Medicare Other

## 2021-05-18 LAB — ECHOCARDIOGRAM LIMITED
Height: 68 in
Weight: 2160 oz

## 2021-05-18 LAB — BASIC METABOLIC PANEL
Anion gap: 7 (ref 5–15)
BUN: 19 mg/dL (ref 8–23)
CO2: 35 mmol/L — ABNORMAL HIGH (ref 22–32)
Calcium: 9 mg/dL (ref 8.9–10.3)
Chloride: 93 mmol/L — ABNORMAL LOW (ref 98–111)
Creatinine, Ser: 0.77 mg/dL (ref 0.44–1.00)
GFR, Estimated: >60 mL/min (ref >60)
Glucose, Bld: 106 mg/dL — ABNORMAL HIGH (ref 70–99)
Potassium: 4.5 mmol/L (ref 3.5–5.1)
Sodium: 135 mmol/L (ref 135–145)

## 2021-05-18 LAB — CBC
HCT: 32.2 % — ABNORMAL LOW (ref 36.0–46.0)
Hemoglobin: 10.6 g/dL — ABNORMAL LOW (ref 12.0–15.0)
MCH: 30 pg (ref 26.0–34.0)
MCHC: 32.9 g/dL (ref 30.0–36.0)
MCV: 91.2 fL (ref 80.0–100.0)
Platelets: 392 10*3/uL (ref 150–400)
RBC: 3.53 MIL/uL — ABNORMAL LOW (ref 3.87–5.11)
RDW: 12.6 % (ref 11.5–15.5)
WBC: 8.4 10*3/uL (ref 4.0–10.5)
nRBC: 0 % (ref 0.0–0.2)

## 2021-05-18 LAB — TSH: TSH: 0.726 u[IU]/mL (ref 0.350–4.500)

## 2021-05-18 NOTE — Progress Notes (Signed)
[facility] Radiology called report for CXR results.  Dr. Kieth Brightly advised results in chart available for review.

## 2021-05-18 NOTE — TOC Progression Note (Signed)
Transition of Care [facility]) - Progression Note    Patient Details  Name: JEFFIFER RABOLD MRN: [id number] Date of Birth: June 07, 1941  [facility]) CM/SW Contact  Ella Bodo, RN Phone Number: 05/18/2021, 10:15 AM  Clinical Narrative:   Left message for patient's grandson, Nelta Numbers, to confirm preferred home health agency/arrangements.      Expected Discharge Plan: [place] Barriers to Discharge: Continued Medical Work up  Expected Discharge Plan and Services Expected Discharge Plan: [place]   Discharge Planning Services: CM Consult Post Acute Care Choice: [place] arrangements for the past 2 months: Single Family Home                 DME Arranged: 3-N-1,Walker rolling,Wheelchair manual,Hospital bed DME Agency: Other - Comment Physicist, medical) Date DME Agency Contacted: 05/16/21 Time DME Agency Contacted: [id number] Representative spoke with at DME Agency: [facility]on [facility] HH Arranged: RN,PT,OT,Nurse's Aide           Social Determinants of Health ([place]) Interventions    Readmission Risk Interventions No flowsheet data found.  Reinaldo Raddle, RN, BSN  Trauma/Neuro ICU Case Manager [phone]

## 2021-05-18 NOTE — Progress Notes (Signed)
Subjective: CC: Rib pain Patient reports she feels much better today.  Still on 3 L but no shortness of breath.  Having some pain over her right ribs that is well controlled.  Heart rate in 80s this morning.  She is using her I-S every 15-20 minutes and pulling 500.  She is tolerating full liquids without any nausea or vomiting.  Denies abdominal pain.  BM yesterday.  She is voiding.  Speech saw yesterday and signed off.  ROS: See above, otherwise other systems negative   Objective: Vital signs in last 24 hours: Temp:  [97.5 F (36.4 C)-98.3 F (36.8 C)] 98.1 F (36.7 C) (05/24 0734) Pulse Rate:  [78-118] 81 (05/24 0734) Resp:  [17-23] 17 (05/24 0734) BP: (127-161)/(58-98) 127/69 (05/24 0734) SpO2:  [91 %-99 %] 99 % (05/24 0734) Last BM Date: 05/16/21  Intake/Output from previous day: 05/23 0701 - 05/24 0700 In: -  Out: 1050 [Urine:1050] Intake/Output this shift: No intake/output data recorded.  PE: General: pleasant, WD/WN white female who is laying in bed in NAD HEENT: head is normocephalic, atraumatic.  Sclera are noninjected.  PERRL.  Ears and nose without any masses or lesions.  Mouth is pink and moist. Dentition fair Heart: Irregular rhythm with regular rate. Palpable radial and pedal pulses bilaterally  Lungs: CTA b/l.No wheezing. No crackles. On 3L. Normal rate and effort. No accessory muscle use. Able to talk in full sentences. Pulling 500 on IS.  Abd: Soft, NT/ND, +BS MS: No BUE/BLE edema, calves soft and nontender Skin: warm and dry with no masses, lesions, or rashes Psych: A&Ox4 with an appropriate affect  Lab Results:  Recent Labs    05/16/21 0258 05/18/21 0553  WBC 6.4 8.4  HGB 10.4* 10.6*  HCT 30.7* 32.2*  PLT 314 392   BMET Recent Labs    05/17/21 0921 05/18/21 0553  NA 135 135  K 3.3* 4.5  CL 96* 93*  CO2 32 35*  GLUCOSE 106* 106*  BUN 17 19  CREATININE 0.69 0.77  CALCIUM 9.3 9.0   PT/INR No results for input(s): LABPROT, INR in  the last 72 hours. CMP     Component Value Date/Time   NA 135 05/18/2021 0553   K 4.5 05/18/2021 0553   CL 93 (L) 05/18/2021 0553   CO2 35 (H) 05/18/2021 0553   GLUCOSE 106 (H) 05/18/2021 0553   BUN 19 05/18/2021 0553   CREATININE 0.77 05/18/2021 0553   CALCIUM 9.0 05/18/2021 0553   PROT 6.7 05/11/2021 0035   ALBUMIN 3.6 05/11/2021 0035   AST 25 05/11/2021 0035   ALT 13 05/11/2021 0035   ALKPHOS 54 05/11/2021 0035   BILITOT 0.5 05/11/2021 0035   GFRNONAA >60 05/18/2021 0553   Lipase  No results found for: LIPASE     Studies/Results: DG Chest 1 View  Result Date: 05/17/2021 CLINICAL DATA:  Status post right-sided thoracentesis EXAM: CHEST  1 VIEW COMPARISON:  Film from earlier in the same day. FINDINGS: Significant reduction in right-sided pleural effusion is noted. Small residual effusion is seen. No pneumothorax is noted. Cardiac shadow is stable. Left lung remains clear. Multiple right-sided rib fractures are again noted. IMPRESSION: No pneumothorax following right-sided thoracentesis. Small residual effusion is seen. Electronically Signed   By: Inez Catalina M.D.   On: 05/17/2021 16:22   DG CHEST PORT 1 VIEW  Result Date: 05/18/2021 CLINICAL DATA:  Shortness of breath. EXAM: PORTABLE CHEST 1 VIEW COMPARISON:  05/17/2021. FINDINGS: Mediastinum and  hilar structures normal. Right base atelectasis/infiltrate. Small right pleural effusion, improved from prior exam. Miniscule right apical pneumothorax cannot be excluded. Heart size stable. Multiple right rib fractures. Degenerative change thoracic spine. IMPRESSION: 1. Right base atelectasis/infiltrate. Small right pleural effusion, improved from prior exam. 2. Miniscule right apical pneumothorax cannot be excluded, follow-up on subsequent exams suggested. 3.  Multiple right rib fractures again noted. These results will be called to the ordering clinician or representative by the Radiologist Assistant, and communication documented in the  PACS or [employer]. Electronically Signed   By: Marcello Moores  Register   On: 05/18/2021 05:24   DG CHEST PORT 1 VIEW  Result Date: 05/17/2021 CLINICAL DATA:  Shortness of breath EXAM: PORTABLE CHEST 1 VIEW COMPARISON:  05/16/2021 FINDINGS: Cardiac shadow is stable. Aortic calcifications are again seen. Left lung is clear. Right sided pleural effusion is again noted with underlying atelectasis. The overall appearance is stable from the prior study. IMPRESSION: No change in right effusion and focal infiltrate/atelectasis. Electronically Signed   By: Inez Catalina M.D.   On: 05/17/2021 11:57   DG CHEST PORT 1 VIEW  Result Date: 05/16/2021 CLINICAL DATA:  Shortness of breath EXAM: PORTABLE CHEST 1 VIEW COMPARISON:  05/15/2021 FINDINGS: Moderate right pleural effusion with right lower lobe consolidation. Left lung clear. Heart is normal size. Aorta calcifications. No acute bony abnormality. No pneumothorax. IMPRESSION: Moderate right pleural effusion with right lower lobe infiltrate. No change. Electronically Signed   By: Rolm Baptise M.D.   On: 05/16/2021 14:14   IR THORACENTESIS ASP PLEURAL SPACE W/IMG GUIDE  Result Date: 05/17/2021 INDICATION: Patient with right pleural effusion status post fall. Request is made for right therapeutic thoracentesis. EXAM: ULTRASOUND GUIDED THERAPEUTIC RIGHT THORACENTESIS MEDICATIONS: 10 ML 1% LIDOCAINE COMPLICATIONS: None immediate. PROCEDURE: An ultrasound guided thoracentesis was thoroughly discussed with the patient and questions answered. The benefits, risks, alternatives and complications were also discussed. The patient understands and wishes to proceed with the procedure. Written consent was obtained. Ultrasound was performed to localize and mark an adequate pocket of fluid in the right chest. The area was then prepped and draped in the normal sterile fashion. 1% Lidocaine was used for local anesthesia. Under ultrasound guidance a 6 Fr Safe-T-Centesis catheter was  introduced. Thoracentesis was performed. The catheter was removed and a dressing applied. FINDINGS: A total of approximately 900 mL of bloody fluid was removed. IMPRESSION: Successful ultrasound guided therapeutic right thoracentesis yielding 900 mL of pleural fluid. Read by: Brynda Greathouse PA-C Electronically Signed   By: Ruthann Cancer MD   On: 05/17/2021 15:48    Anti-infectives: Anti-infectives (From admission, onward)   None       Assessment/Plan GLF Segmental R rib FX 6-10 with PTXand traumatic pneumatoceles- CT out 5/21. Retained R pleural effusion after CT pulled. Thoracentesis 5/23 by IR. w/ 900cc bloody fluid. CXR 5/24 w/ improved R pleural effusion but possible small apical ptx. Repeat in AM. PT/OT Acute hypercarbic respiratory failure- Extubated 5/17. On 3L. Wean as able. O2 home eval screening today with PT.  COPD- duoneb PRN. No wheezing on exam this am.  HTN- home losartan d/c'd by cards 5/23. Started on scheduled metoprolol during admission for A. Fib A. Fib in RVR - RVR resolved. A. Fib new onset during admission. Cards consult 5/23, Dr. Einar Gip. Started on scheduled metoprolol. Echo ordered.  Hyponatremia - resolved FEN-Soft (discussed with speech) ensure, SLIV VTE- SCDs, Lovenox ID - None currently. Afebrile. WBC 8.4 Foley - None currently. Voiding.  Dispo-  PT/OT.Recommending 24H supervision. Per TOC note her daughter and grandson will be able to provide supervision at discharge.     LOS: 7 days    Jillyn Ledger , [facility] 05/18/2021, 8:30 AM Please see Amion for pager number during day hours 7:00am-4:30pm

## 2021-05-18 NOTE — Progress Notes (Signed)
Physical Therapy Treatment Patient Details Name: Melanie [name] MRN: [id number] DOB: Apr 12, 1941 Today's Date: 05/18/2021    History of Present Illness Pt is an 80 y.o. female who presented 5/17 as a level 1 trauma due to right chest wall crepitus, hypoxia, and HTN following falling out of bed onto her R side. Pt found to have R hemopneumothorax and R rib fxs 6-10. R chest tube placed 5/17-5/21. ETT 5/17-5/17. PMH: COPD.    PT Comments    Pt progressing towards her physical therapy goals today. Requiring min assist overall for functional mobility. Assisted onto bedside commode and then ambulated 30 ft with a walker. HR better controlled; 89-104 afib, desaturation to 69-72% on 3L O2 during mobility (unreliable pleth), rebounded to > 90% on 6L O2. D/c plan remains appropriate.    Follow Up Recommendations  Home health PT;Supervision/Assistance - 24 hour     Equipment Recommendations  Rolling walker with 5" wheels;3in1 (PT);Wheelchair (measurements PT);Wheelchair cushion (measurements PT)    Recommendations for Other Services       Precautions / Restrictions Precautions Precautions: Fall Precaution Comments: monitor sats, HR, urinary incontinence Restrictions Weight Bearing Restrictions: No    Mobility  Bed Mobility Overal bed mobility: Needs Assistance Bed Mobility: Supine to Sit     Supine to sit: Supervision     General bed mobility comments: Able to progress to sitting position without physical assist. HOB elevated, use of bed rail    Transfers Overall transfer level: Needs assistance Equipment used: Rolling walker (2 wheeled);1 person hand held assist Transfers: Sit to/from [facility] Sit to Stand: Min assist Stand pivot transfers: Min guard       General transfer comment: MinA to rise and steady, cues for hand placement. Min guard to pivot onto [facility]  Ambulation/Gait Ambulation/Gait assistance: Min assist Gait Distance (Feet): 30  Feet Assistive device: Rolling walker (2 wheeled) Gait Pattern/deviations: Step-through pattern;Decreased stride length;Trunk flexed Gait velocity: decreased Gait velocity interpretation: <1.8 ft/sec, indicate of risk for recurrent falls General Gait Details: Cues for sequencing/direction, intermittent manual assist to maintain walker on a straight path and for turns, very slow but overall steady pace. Cues also provided for walker proximity   Stairs             Wheelchair Mobility    Modified Rankin (Stroke Patients Only)       Balance Overall balance assessment: Needs assistance Sitting-balance support: No upper extremity supported;Feet supported Sitting balance-Leahy Scale: Fair     Standing balance support: Bilateral upper extremity supported Standing balance-Leahy Scale: Poor Standing balance comment: Reliant on bil UE support and external assist.                            Cognition Arousal/Alertness: Awake/alert Behavior During Therapy: WFL for tasks assessed/performed Overall Cognitive Status: Impaired/Different from baseline Area of Impairment: Problem solving                             Problem Solving: Slow processing;Decreased initiation;Difficulty sequencing;Requires verbal cues General Comments: Cues provided for initiation, sequencing      Exercises      General Comments        Pertinent Vitals/Pain Pain Assessment: Faces Faces Pain Scale: Hurts even more Pain Location: R side Pain Descriptors / Indicators: Discomfort;Grimacing;Guarding Pain Intervention(s): Limited activity within patient's tolerance;Monitored during session;Other (comment) (applied pillow splint)    Home Living  Prior Function            PT Goals (current goals can now be found in the care plan section) Acute Rehab PT Goals Patient Stated Goal: less pain PT Goal Formulation: With patient/family Time For Goal  Achievement: 05/26/21 Potential to Achieve Goals: Good Progress towards PT goals: Progressing toward goals    Frequency    Min 3X/week      PT Plan Current plan remains appropriate    Co-evaluation              AM-PAC PT "6 Clicks" Mobility   Outcome Measure  Help needed turning from your back to your side while in a flat bed without using bedrails?: A Little Help needed moving from lying on your back to sitting on the side of a flat bed without using bedrails?: A Little Help needed moving to and from a bed to a chair (including a wheelchair)?: A Little Help needed standing up from a chair using your arms (e.g., wheelchair or bedside chair)?: A Little Help needed to walk in hospital room?: A Little Help needed climbing 3-5 steps with a railing? : A Lot 6 Click Score: 17    End of Session Equipment Utilized During Treatment: Gait belt;Oxygen Activity Tolerance: Patient tolerated treatment well Patient left: with call bell/phone within reach;in chair;with chair alarm set Nurse Communication: Mobility status PT Visit Diagnosis: Unsteadiness on feet (R26.81);Other abnormalities of gait and mobility (R26.89);Muscle weakness (generalized) (M62.81);History of falling (Z91.81);Difficulty in walking, not elsewhere classified (R26.2);Pain     Time: 0258-5277 PT Time Calculation (min) (ACUTE ONLY): 28 min  Charges:  $Gait Training: 8-22 mins $Therapeutic Activity: 8-22 mins                     Melanie [name], PT, DPT Acute Rehabilitation Services Pager [phone] Office [phone]    Deno Etienne 05/18/2021, 8:52 AM

## 2021-05-18 NOTE — Progress Notes (Signed)
Subjective:  Has chest pain right.  Daughter present at the bedside.  No other specific symptoms.  Having her meal sitting at the side of the bed on a chair.  Intake/Output from previous day:  I/O last 3 completed shifts: In: -  Out: 1050 [Urine:1050] No intake/output data recorded.  Blood pressure (!) 105/47, pulse 75, temperature 98.5 F (36.9 C), temperature source Oral, resp. rate 17, height 5' 8"  (1.727 m), weight 61.2 kg, SpO2 100 %. Physical Exam Constitutional:      Appearance: Normal appearance.  HENT:     Head: Atraumatic.     Mouth/Throat:     Mouth: Mucous membranes are moist.  Cardiovascular:     Rate and Rhythm: Normal rate. Rhythm irregular.     Pulses: Normal pulses and intact distal pulses.     Heart sounds: No murmur heard. No gallop. No S3 or S4 sounds.   Pulmonary:     Effort: Pulmonary effort is normal.     Breath sounds: Rales (Bilateral diffuse) present.     Comments: Bronchial breath sounds right chest Abdominal:     General: Abdomen is flat. Bowel sounds are normal.     Palpations: Abdomen is soft.  Musculoskeletal:        General: No swelling.     Cervical back: Normal range of motion and neck supple.  Skin:    General: Skin is warm.  Neurological:     General: No focal deficit present.     Mental Status: She is alert and oriented to person, place, and time.     Lab Results: BMP BNP (last 3 results) No results for input(s): BNP in the last 8760 hours.  ProBNP (last 3 results) No results for input(s): PROBNP in the last 8760 hours. BMP Latest Ref Rng & Units 05/18/2021 05/17/2021 05/16/2021  Glucose 70 - 99 mg/dL 106(H) 106(H) 108(H)  BUN 8 - 23 mg/dL 19 17 20   Creatinine 0.44 - 1.00 mg/dL 0.77 0.69 0.62  Sodium 135 - 145 mmol/L 135 135 132(L)  Potassium 3.5 - 5.1 mmol/L 4.5 3.3(L) 3.9  Chloride 98 - 111 mmol/L 93(L) 96(L) 95(L)  CO2 22 - 32 mmol/L 35(H) 32 32  Calcium 8.9 - 10.3 mg/dL 9.0 9.3 9.0   Hepatic Function Latest Ref Rng &  Units 05/11/2021  Total Protein 6.5 - 8.1 g/dL 6.7  Albumin 3.5 - 5.0 g/dL 3.6  AST 15 - 41 U/L 25  ALT 0 - 44 U/L 13  Alk Phosphatase 38 - 126 U/L 54  Total Bilirubin 0.3 - 1.2 mg/dL 0.5   CBC Latest Ref Rng & Units 05/18/2021 05/16/2021 05/15/2021  WBC 4.0 - 10.5 K/uL 8.4 6.4 6.4  Hemoglobin 12.0 - 15.0 g/dL 10.6(L) 10.4(L) 10.6(L)  Hematocrit 36.0 - 46.0 % 32.2(L) 30.7(L) 32.0(L)  Platelets 150 - 400 K/uL 392 314 297    Imaging: DG Chest 1 View  Result Date: 05/17/2021 CLINICAL DATA:  Status post right-sided thoracentesis EXAM: CHEST  1 VIEW COMPARISON:  Film from earlier in the same day. FINDINGS: Significant reduction in right-sided pleural effusion is noted. Small residual effusion is seen. No pneumothorax is noted. Cardiac shadow is stable. Left lung remains clear. Multiple right-sided rib fractures are again noted. IMPRESSION: No pneumothorax following right-sided thoracentesis. Small residual effusion is seen. Electronically Signed   By: Inez Catalina M.D.   On: 05/17/2021 16:22   DG CHEST PORT 1 VIEW  Result Date: 05/18/2021 CLINICAL DATA:  Shortness of breath. EXAM: PORTABLE CHEST 1  VIEW COMPARISON:  05/17/2021. FINDINGS: Mediastinum and hilar structures normal. Right base atelectasis/infiltrate. Small right pleural effusion, improved from prior exam. Miniscule right apical pneumothorax cannot be excluded. Heart size stable. Multiple right rib fractures. Degenerative change thoracic spine. IMPRESSION: 1. Right base atelectasis/infiltrate. Small right pleural effusion, improved from prior exam. 2. Miniscule right apical pneumothorax cannot be excluded, follow-up on subsequent exams suggested. 3.  Multiple right rib fractures again noted. These results will be called to the ordering clinician or representative by the Radiologist Assistant, and communication documented in the PACS or [employer]. Electronically Signed   By: Marcello Moores  Register   On: 05/18/2021 05:24   DG CHEST PORT 1  VIEW  Result Date: 05/17/2021 CLINICAL DATA:  Shortness of breath EXAM: PORTABLE CHEST 1 VIEW COMPARISON:  05/16/2021 FINDINGS: Cardiac shadow is stable. Aortic calcifications are again seen. Left lung is clear. Right sided pleural effusion is again noted with underlying atelectasis. The overall appearance is stable from the prior study. IMPRESSION: No change in right effusion and focal infiltrate/atelectasis. Electronically Signed   By: Inez Catalina M.D.   On: 05/17/2021 11:57   DG CHEST PORT 1 VIEW  Result Date: 05/16/2021 CLINICAL DATA:  Shortness of breath EXAM: PORTABLE CHEST 1 VIEW COMPARISON:  05/15/2021 FINDINGS: Moderate right pleural effusion with right lower lobe consolidation. Left lung clear. Heart is normal size. Aorta calcifications. No acute bony abnormality. No pneumothorax. IMPRESSION: Moderate right pleural effusion with right lower lobe infiltrate. No change. Electronically Signed   By: Rolm Baptise M.D.   On: 05/16/2021 14:14   IR THORACENTESIS ASP PLEURAL SPACE W/IMG GUIDE  Result Date: 05/17/2021 INDICATION: Patient with right pleural effusion status post fall. Request is made for right therapeutic thoracentesis. EXAM: ULTRASOUND GUIDED THERAPEUTIC RIGHT THORACENTESIS MEDICATIONS: 10 ML 1% LIDOCAINE COMPLICATIONS: None immediate. PROCEDURE: An ultrasound guided thoracentesis was thoroughly discussed with the patient and questions answered. The benefits, risks, alternatives and complications were also discussed. The patient understands and wishes to proceed with the procedure. Written consent was obtained. Ultrasound was performed to localize and mark an adequate pocket of fluid in the right chest. The area was then prepped and draped in the normal sterile fashion. 1% Lidocaine was used for local anesthesia. Under ultrasound guidance a 6 Fr Safe-T-Centesis catheter was introduced. Thoracentesis was performed. The catheter was removed and a dressing applied. FINDINGS: A total of  approximately 900 mL of bloody fluid was removed. IMPRESSION: Successful ultrasound guided therapeutic right thoracentesis yielding 900 mL of pleural fluid. Read by: Brynda Greathouse PA-C Electronically Signed   By: Ruthann Cancer MD   On: 05/17/2021 15:48    Cardiac Studies:  EKG:   EKG 05/11/2021: Sinus tachycardia at rate of 105 bpm, normal axis, no evidence of ischemia.  Borderline criteria for LVH by Edmonia Lynch criteria.  Baseline artifact.  EKG 05/14/2021: Atrial fibrillation with rapid ventricular response at rate of 107 bpm, no evidence of ischemia.  Echocardiogram pending  No results found for this or any previous visit (from the past 43800 hour(s)).  Scheduled Meds: . acetaminophen  1,000 mg Oral Q8H  . chlorhexidine  15 mL Mouth Rinse BID  . Chlorhexidine Gluconate Cloth  6 each Topical Daily  . diltiazem  30 mg Oral Q6H  . docusate sodium  100 mg Oral BID  . enoxaparin (LOVENOX) injection  30 mg Subcutaneous Q12H  . feeding supplement  1 Container Oral TID BM  . mouth rinse  15 mL Mouth Rinse q12n4p  . metoprolol tartrate  50 mg Oral BID  . pantoprazole  40 mg Oral Daily  . polyethylene glycol  17 g Oral Daily   Continuous Infusions: PRN Meds:.bisacodyl, ipratropium-albuterol, lidocaine, methocarbamol, metoprolol tartrate, morphine injection, ondansetron **OR** ondansetron (ZOFRAN) IV, oxyCODONE  Assessment/Plan:  1. New onset atrial fibrillation with rapid ventricular response, precipitated by mechanical fall and right hemopneumothorax.  CHA2DS2-VASc Score is 4.  Yearly risk of stroke: 4.8% (A, F, HTN).  Score of 1=0.6; 2=2.2; 3=3.2; 4=4.8; 5=7.2; 6=9.8; 7=>9.8) -(CHF; HTN; vasc disease DM,  Female = 1; Age <65 =0; 65-74 = 1,  >75 =2; stroke/embolism= 2).   2.  Primary hypertension.  Patient's heart rate is much better controlled on increasing the dose of metoprolol to 50 mg twice daily and she is also on diltiazem plain 30 mg every 6 hours.  Would continue the same for now.   She can be followed up in the outpatient basis with me in 4 to 6 weeks.  She is not a candidate for anticoagulation in view of hemothorax and risk of bleed.  Suspect atrial fibrillation is related to trauma and chest wall injury and pneumothorax.  Will check TSH.  Please call if questions.   Adrian Prows, MD, [facility] 05/18/2021, 12:04 PM Office: [phone] Fax: [phone] Pager: [phone]  If no answer: [phone]

## 2021-05-18 NOTE — Progress Notes (Signed)
  Echocardiogram 2D Echocardiogram limited has been performed.  Darlina Sicilian M 05/18/2021, 2:00 PM

## 2021-05-19 ENCOUNTER — Inpatient Hospital Stay (HOSPITAL_COMMUNITY): Payer: Medicare Other

## 2021-05-19 MED ORDER — HYDROXYZINE HCL 10 MG PO TABS
10.0000 mg | ORAL_TABLET | Freq: Two times a day (BID) | ORAL | Status: DC | PRN
  Filled 2021-05-19: qty 1

## 2021-05-19 MED ORDER — LORAZEPAM 2 MG/ML IJ SOLN
0.5000 mg | Freq: Once | INTRAMUSCULAR | Status: AC | PRN
  Administered 2021-05-20: 0.5 mg via INTRAVENOUS
  Filled 2021-05-19: qty 1

## 2021-05-19 MED ORDER — OXYCODONE HCL 5 MG PO TABS
5.0000 mg | ORAL_TABLET | ORAL | Status: DC | PRN
  Administered 2021-05-19 (×2): 10 mg via ORAL
  Administered 2021-05-20 (×2): 5 mg via ORAL
  Filled 2021-05-19: qty 1
  Filled 2021-05-19: qty 2
  Filled 2021-05-19: qty 1
  Filled 2021-05-19: qty 2
  Filled 2021-05-19: qty 1

## 2021-05-19 NOTE — Progress Notes (Signed)
Physical Therapy Treatment Patient Details Name: Melanie [name] MRN: [id number] DOB: [birth year] Today's Date: 05/19/2021    History of Present Illness Pt is an 80 y.o. female who presented 5/17 as a level 1 trauma due to right chest wall crepitus, hypoxia, and HTN following falling out of bed onto her R side. Pt found to have R hemopneumothorax and R rib fxs 6-10. R chest tube placed 5/17-5/21. ETT 5/17-5/17. PMH: COPD.    PT Comments    Pt progressing slowly towards her physical therapy goals. Assisted to [facility] initially due to urinary frequency/incontinence. Pt then ambulating 18 ft, then an additional 10 ft with a walker and one seated rest break. SpO2 96% on 3L O2, HR stable. Continues with deconditioning, pain, and balance deficits. D/c plan remains appropriate.    Follow Up Recommendations  Home health PT;Supervision/Assistance - 24 hour     Equipment Recommendations  Rolling walker with 5" wheels;3in1 (PT);Wheelchair (measurements PT);Wheelchair cushion (measurements PT)    Recommendations for Other Services       Precautions / Restrictions Precautions Precautions: Fall Precaution Comments: monitor sats, urinary incontinence Restrictions Weight Bearing Restrictions: No    Mobility  Bed Mobility               General bed mobility comments: Received sitting in chair    Transfers Overall transfer level: Needs assistance Equipment used: Rolling walker (2 wheeled);1 person hand held assist Transfers: Sit to/from Stand;Stand Pivot Transfers Sit to Stand: Min guard Stand pivot transfers: Min guard       General transfer comment: Min guard to rise from recliner, bed, and BSC  Ambulation/Gait Ambulation/Gait assistance: Min guard Gait Distance (Feet): 28 Feet (18", 10") Assistive device: Rolling walker (2 wheeled) Gait Pattern/deviations: Step-through pattern;Decreased stride length;Trunk flexed Gait velocity: decreased Gait velocity interpretation: <1.31  ft/sec, indicative of household ambulator General Gait Details: Very slow pace, cues for sequencing/direction, fatigues easily. Min guard overall for stability. Required one sitting rest break   Stairs             Wheelchair Mobility    Modified Rankin (Stroke Patients Only)       Balance Overall balance assessment: Needs assistance Sitting-balance support: No upper extremity supported;Feet supported Sitting balance-Leahy Scale: Fair     Standing balance support: Bilateral upper extremity supported Standing balance-Leahy Scale: Poor Standing balance comment: Reliant on bil UE support and external assist.                            Cognition Arousal/Alertness: Awake/alert Behavior During Therapy: WFL for tasks assessed/performed Overall Cognitive Status: Impaired/Different from baseline Area of Impairment: Problem solving;Memory                     Memory: Decreased short-term memory       Problem Solving: Slow processing;Decreased initiation;Difficulty sequencing;Requires verbal cues General Comments: Cues provided for initiation, sequencing      Exercises General Exercises - Lower Extremity Long Arc Quad: Both;10 reps;Seated    General Comments        Pertinent Vitals/Pain Pain Assessment: 0-10 Pain Score: 7  Pain Location: R side, bilateral shoulders Pain Descriptors / Indicators: Discomfort;Grimacing;Guarding Pain Intervention(s): Limited activity within patient's tolerance;Monitored during session;Other (comment) (RN notified)    Home Living                      Prior Function  PT Goals (current goals can now be found in the care plan section) Acute Rehab PT Goals Patient Stated Goal: less pain PT Goal Formulation: With patient/family Time For Goal Achievement: 05/26/21 Potential to Achieve Goals: Good Progress towards PT goals: Progressing toward goals    Frequency    Min 3X/week      PT Plan  Current plan remains appropriate    Co-evaluation              AM-PAC PT "6 Clicks" Mobility   Outcome Measure  Help needed turning from your back to your side while in a flat bed without using bedrails?: A Little Help needed moving from lying on your back to sitting on the side of a flat bed without using bedrails?: A Little Help needed moving to and from a bed to a chair (including a wheelchair)?: A Little Help needed standing up from a chair using your arms (e.g., wheelchair or bedside chair)?: A Little Help needed to walk in hospital room?: A Little Help needed climbing 3-5 steps with a railing? : A Lot 6 Click Score: 17    End of Session Equipment Utilized During Treatment: Gait belt;Oxygen Activity Tolerance: Patient tolerated treatment well Patient left: with call bell/phone within reach;in chair;with chair alarm set Nurse Communication: Mobility status PT Visit Diagnosis: Unsteadiness on feet (R26.81);Other abnormalities of gait and mobility (R26.89);Muscle weakness (generalized) (M62.81);History of falling (Z91.81);Difficulty in walking, not elsewhere classified (R26.2);Pain     Time: 7322-0254 PT Time Calculation (min) (ACUTE ONLY): 36 min  Charges:  $Therapeutic Activity: 23-37 mins                     Wyona Almas, PT, DPT Acute Rehabilitation Services Pager [phone] Office [place] 05/19/2021, 1:27 PM

## 2021-05-19 NOTE — Progress Notes (Signed)
Subjective: CC: Patient with right sided rib pain this am. Asking for pain medication. Feels the pain medications are helping when she takes them. Some sob. Still on 3L. Required 6L w/ ambulation yesterday. She is using her I-S every 15-20 minutes and pulling 500.  She is tolerating soft diet without any nausea, vomiting or reported difficulty with swallowing.  Denies abdominal pain. Last BM 5/22.  She is voiding.    Objective: Vital signs in last 24 hours: Temp:  [97.7 F (36.5 C)-98.5 F (36.9 C)] 97.9 F (36.6 C) (05/25 0759) Pulse Rate:  [74-92] 87 (05/25 0759) Resp:  [12-22] 22 (05/25 0759) BP: (100-134)/(47-64) 132/59 (05/25 0759) SpO2:  [97 %-100 %] 99 % (05/25 0759) Last BM Date: 05/16/21  Intake/Output from previous day: 05/24 0701 - 05/25 0700 In: 360 [P.O.:360] Out: 300 [Urine:300] Intake/Output this shift: No intake/output data recorded.  PE: General: pleasant, WD/WN whitefemale who is laying in bed in NAD HEENT: head is normocephalic, atraumatic. Sclera are noninjected. PERRL. Ears and nose without any masses or lesions. Mouth is pink and moist. Dentition fair Heart:Irregular rhythm with regular rate.Palpable radial andpedal pulses bilaterally  Lungs:CTA b/l. Faint expiratory wheezing. No crackles. On 3L. Normal rate and effort. No accessory muscle use. Able to talk in full sentences.Pulling 500 on IS.  IRJ:JOAC, NT/ND, +BS MS:No BUE/BLE edema, calves soft and nontender Skin: warm and dry with no masses, lesions, or rashes Psych: A&Ox4 with an appropriate affect  Lab Results:  Recent Labs    05/18/21 0553  WBC 8.4  HGB 10.6*  HCT 32.2*  PLT 392   BMET Recent Labs    05/17/21 0921 05/18/21 0553  NA 135 135  K 3.3* 4.5  CL 96* 93*  CO2 32 35*  GLUCOSE 106* 106*  BUN 17 19  CREATININE 0.69 0.77  CALCIUM 9.3 9.0   PT/INR No results for input(s): LABPROT, INR in the last 72 hours. CMP     Component Value Date/Time   NA 135  05/18/2021 0553   K 4.5 05/18/2021 0553   CL 93 (L) 05/18/2021 0553   CO2 35 (H) 05/18/2021 0553   GLUCOSE 106 (H) 05/18/2021 0553   BUN 19 05/18/2021 0553   CREATININE 0.77 05/18/2021 0553   CALCIUM 9.0 05/18/2021 0553   PROT 6.7 05/11/2021 0035   ALBUMIN 3.6 05/11/2021 0035   AST 25 05/11/2021 0035   ALT 13 05/11/2021 0035   ALKPHOS 54 05/11/2021 0035   BILITOT 0.5 05/11/2021 0035   GFRNONAA >60 05/18/2021 0553   Lipase  No results found for: LIPASE     Studies/Results: DG Chest 1 View  Result Date: 05/17/2021 CLINICAL DATA:  Status post right-sided thoracentesis EXAM: CHEST  1 VIEW COMPARISON:  Film from earlier in the same day. FINDINGS: Significant reduction in right-sided pleural effusion is noted. Small residual effusion is seen. No pneumothorax is noted. Cardiac shadow is stable. Left lung remains clear. Multiple right-sided rib fractures are again noted. IMPRESSION: No pneumothorax following right-sided thoracentesis. Small residual effusion is seen. Electronically Signed   By: Inez Catalina M.D.   On: 05/17/2021 16:22   DG CHEST PORT 1 VIEW  Result Date: 05/19/2021 CLINICAL DATA:  Shortness of breath.  Rib fracture. EXAM: PORTABLE CHEST 1 VIEW COMPARISON:  Chest x-ray 05/18/2021, CT chest 05/11/2021 FINDINGS: The heart size and mediastinal contours are within normal limits. Biapical pleural/pulmonary scarring. No focal consolidation. No pulmonary edema. Interval increase in trace to small volume right  pleural effusion. Redemonstration of right mid to lower lung zone airspace opacity. Persistent trace right pneumothorax. No left pneumothorax. No acute osseous abnormality. IMPRESSION: 1. Interval increase in trace to small volume right pleural fluid. 2. Persistent trace right pneumothorax. 3. Right mid to lower lung zone airspace opacity again noted. Electronically Signed   By: Iven Finn M.D.   On: 05/19/2021 05:47   DG CHEST PORT 1 VIEW  Result Date: 05/18/2021 CLINICAL  DATA:  Shortness of breath. EXAM: PORTABLE CHEST 1 VIEW COMPARISON:  05/17/2021. FINDINGS: Mediastinum and hilar structures normal. Right base atelectasis/infiltrate. Small right pleural effusion, improved from prior exam. Miniscule right apical pneumothorax cannot be excluded. Heart size stable. Multiple right rib fractures. Degenerative change thoracic spine. IMPRESSION: 1. Right base atelectasis/infiltrate. Small right pleural effusion, improved from prior exam. 2. Miniscule right apical pneumothorax cannot be excluded, follow-up on subsequent exams suggested. 3.  Multiple right rib fractures again noted. These results will be called to the ordering clinician or representative by the Radiologist Assistant, and communication documented in the PACS or [employer]. Electronically Signed   By: Marcello Moores  Register   On: 05/18/2021 05:24   ECHOCARDIOGRAM LIMITED  Result Date: 05/18/2021    ECHOCARDIOGRAM LIMITED REPORT   Patient Name:   Melanie [name] Date of Exam: 05/18/2021 Medical Rec #:  [id number]         Height:       68.0 in Accession #:    [id number]        Weight:       135.0 lb Date of Birth:  18-Apr-1941          BSA:          1.729 m Patient Age:    80 years          BP:           105/47 mmHg Patient Gender: F                 HR:           83 bpm. Exam Location:  Inpatient Procedure: Limited Echo Indications:     Atrial Fibrillation I48.91  History:         Patient has no prior history of Echocardiogram examinations.                  Risk Factors:Hypertension.  Sonographer:     Darlina Sicilian RDCS Referring Phys:  [place] Diagnosing Phys: Adrian Prows MD  Sonographer Comments: Exam ended per patients request, the exam was causing too much pain. IMPRESSIONS  1. Grossly preserved LVEF by image 16. . Left ventricular endocardial border not optimally defined to evaluate regional wall motion. Left ventricular diastolic function could not be evaluated.  2. Right ventricular systolic function was not well  visualized.  3. Grossly normal sized.  4. Grossly no pericardial efffusion.  5. The mitral valve was not well visualized. No evidence of mitral valve regurgitation.  6. The aortic valve was not well visualized. Conclusion(s)/Recommendation(s): Poor echo window, patient with flail chest and hydropneumothorax involving the right and difficulty image. Grossly left ventricular systolic function appears preserved and the left atrium appears grossly normal sized. FINDINGS  Left Ventricle: Grossly preserved LVEF by image 16. Left ventricular endocardial border not optimally defined to evaluate regional wall motion. Left ventricular diastolic function could not be evaluated. Right Ventricle: Right ventricular systolic function was not well visualized. Left Atrium: Grossly normal sized. Left atrial size was not well  visualized. Right Atrium: Right atrial size was not well visualized. Pericardium: Grossly no pericardial efffusion. The pericardium was not well visualized. Mitral Valve: The mitral valve was not well visualized. Tricuspid Valve: The tricuspid valve is not well visualized. Aortic Valve: The aortic valve was not well visualized. Pulmonic Valve: The pulmonic valve was not assessed. Aorta: The aortic root was not well visualized. Venous: The inferior vena cava was not well visualized. LEFT VENTRICLE PLAX 2D LVOT diam:     1.90 cm LVOT Area:     2.84 cm   AORTA Ao Root diam: 3.40 cm  SHUNTS Systemic Diam: 1.90 cm Adrian Prows MD Electronically signed by Adrian Prows MD Signature Date/Time: 05/18/2021/9:20:31 PM    Final    IR THORACENTESIS ASP PLEURAL SPACE W/IMG GUIDE  Result Date: 05/17/2021 INDICATION: Patient with right pleural effusion status post fall. Request is made for right therapeutic thoracentesis. EXAM: ULTRASOUND GUIDED THERAPEUTIC RIGHT THORACENTESIS MEDICATIONS: 10 ML 1% LIDOCAINE COMPLICATIONS: None immediate. PROCEDURE: An ultrasound guided thoracentesis was thoroughly discussed with the patient and  questions answered. The benefits, risks, alternatives and complications were also discussed. The patient understands and wishes to proceed with the procedure. Written consent was obtained. Ultrasound was performed to localize and mark an adequate pocket of fluid in the right chest. The area was then prepped and draped in the normal sterile fashion. 1% Lidocaine was used for local anesthesia. Under ultrasound guidance a 6 Fr Safe-T-Centesis catheter was introduced. Thoracentesis was performed. The catheter was removed and a dressing applied. FINDINGS: A total of approximately 900 mL of bloody fluid was removed. IMPRESSION: Successful ultrasound guided therapeutic right thoracentesis yielding 900 mL of pleural fluid. Read by: Brynda Greathouse PA-C Electronically Signed   By: Ruthann Cancer MD   On: 05/17/2021 15:48    Anti-infectives: Anti-infectives (From admission, onward)   None       Assessment/Plan GLF Segmental R rib FX 6-10 with PTXand traumatic pneumatoceles- CT out 5/21. Retained R pleural effusion after CT pulled. Thoracentesis 5/23 by IR w/ 900cc bloody fluid. CXR today w/ increased R pleural effusion and stable small apical ptx. Repeat in AM.PT/OT.  Acute hypercarbic respiratory failure-Extubated5/17. On 3L. Wean as able. Home o2 ordered COPD- duoneb PRN.  HTN- home losartan d/c'd by cards 5/23. Started on scheduled metoprolol during admission for A. Fib A. Fibin RVR- RVR resolved. A. Fib new onset during admission. Cards consult 5/23, Dr. Einar Gip. They suspect A. Fib is 2/2 trauma. Started on scheduled metoprolol and cardizem. Echo limited as stopped 2/2 pain but with persevered LVEF per report. Do not recommend anticoagulation.  Hyponatremia - resolved 5/24 labs  FEN-Soft, ensure, SLIV VTE- SCDs, Lovenox ID - None currently. Afebrile.  Foley - None currently. Voiding.  Dispo- PT/OT.Recommending 24H supervision.Per TOC note her daughter and grandson will be able to provide  supervision at discharge.    LOS: 8 days    Jillyn Ledger , [facility] Surgery 05/19/2021, 9:43 AM Please see Amion for pager number during day hours 7:00am-4:30pm

## 2021-05-19 NOTE — Progress Notes (Addendum)
[facility] ordered for her to have PRN duoneb and I called respiratory to inform them and they said they would. Patient is up to 4 Liters nasal cannula.

## 2021-05-19 NOTE — Progress Notes (Signed)
SATURATION QUALIFICATIONS: (This note is used to comply with regulatory documentation for home oxygen)  Patient Saturations on Room Air at Rest = 92%  Patient Saturations on Room Air while Ambulating = 87%  Patient Saturations on 3 Liters of oxygen while Ambulating = 96%  Please briefly explain why patient needs home oxygen: To maintain oxygen saturations > 90% while ambulating.  Wyona Almas, PT, DPT Acute Rehabilitation Services Pager [phone] Office [phone]

## 2021-05-20 ENCOUNTER — Inpatient Hospital Stay (HOSPITAL_COMMUNITY): Payer: Medicare Other

## 2021-05-20 MED ORDER — OXYCODONE HCL 5 MG PO TABS: 5.0000 mg | ORAL_TABLET | Freq: Four times a day (QID) | ORAL | 0 refills | Status: DC | PRN

## 2021-05-20 MED ORDER — METOPROLOL TARTRATE 50 MG PO TABS: 50.0000 mg | ORAL_TABLET | Freq: Two times a day (BID) | ORAL | 1 refills | Status: AC

## 2021-05-20 MED ORDER — DOCUSATE SODIUM 100 MG PO CAPS: 100.0000 mg | ORAL_CAPSULE | Freq: Two times a day (BID) | ORAL | 0 refills | Status: DC | PRN

## 2021-05-20 MED ORDER — POLYETHYLENE GLYCOL 3350 17 G PO PACK: 17.0000 g | PACK | Freq: Every day | ORAL | 0 refills | Status: DC | PRN

## 2021-05-20 MED ORDER — ACETAMINOPHEN 500 MG PO TABS: 1000.0000 mg | ORAL_TABLET | Freq: Three times a day (TID) | ORAL | 0 refills | Status: DC

## 2021-05-20 MED ORDER — DILTIAZEM HCL 30 MG PO TABS: 30.0000 mg | ORAL_TABLET | Freq: Four times a day (QID) | ORAL | 1 refills | Status: DC

## 2021-05-20 NOTE — Discharge Summary (Signed)
Patient ID: Melanie [name] [id number] [birth year] 80 y.o.  Admit date: 05/11/2021 Discharge date: 05/20/2021  Admitting Diagnosis: Fall out of bed Right hemopneumothorax Pigtail chest tube placed on right.   Intubated for VDRF  Discharge Diagnosis GLF Segmental R rib FX 6-10 with PTX Oxygen dependence  COPD HTN A. Fib  Consultants Cardiology  IR  H&P: Pt is a 80 yo F who fell out of bed onto her right side.  She was brought to the [facility] ED as a level 1 trauma due to right chest wall crepitus, hypoxia, and hypertension. She had labored breathing and was examined quickly and intubated.  She denied headache, nausea, vomiting, or abdominal pain.  She denies any sick contacts.  She hasn't been dizzy.  She doesn't know what precipitated the fall out of bed.    Procedures Right pigtail chest tube placement - Dr. Barry Dienes - 05/11/21  Thoracentesis 5/23 by [facility] Course:  Patient presented as a level 1 trauma as noted above after she fell out of bed onto her right side.  Patient was intubated in the trauma bay.  Patient was found to have right rib fractures and pneumothorax.  A right pigtail chest tube was placed.  Patient was admitted to the trauma service ICU. She was weaned and extubated on 5/17 and remained on o2 after extubation. Serial chest xrays were monitored and once chest output decreased and pneumothorax improved the chest tube was removed.  Patient retained pleural effusion after chest tube was pulled.  This required a thoracentesis by IR on 5/23.  Serial chest x-rays were monitored with improvement of right pleural effusion that at time of discharge was small and stable.  During admission patient developed A. fib with RVR.  Cardiology was consulted on 5/23, Dr. Einar Gip.  Patient was started on metoprolol and Cardizem.  She will be discharged on this with follow-up with cardiology. An echo was performed during admission but was stopped 2/2 pain but. The portion of the echo  that was perfomed reports grossly persevered LVEF. Patient's O2 was able to be weaned to 3 L during admission but she required O2 at time of discharge.  She does have a PCP will follow up with them for this. During admission patient worked with PT/OT who recommended [facility] w/ 24/7 supervision. Patient plans to stay with her son and daughter in law who are retired and are able to provide 24/7 supervision. DME and HH arranged by TOC. On 5/26, the patient was voiding well, tolerating diet, working well with therapies, pain well controlled, vital signs stable on o2, and felt stable for discharge home. Follow up as noted below. Return precautions discussed.    Allergies as of 05/20/2021   No Known Allergies     Medication List    STOP taking these medications   losartan 100 MG tablet Commonly known as: COZAAR     TAKE these medications   acetaminophen 500 MG tablet Commonly known as: TYLENOL Take 2 tablets (1,000 mg total) by mouth every 8 (eight) hours.   CALCIUM PO Take 1 tablet by mouth daily.   diltiazem 30 MG tablet Commonly known as: CARDIZEM Take 1 tablet (30 mg total) by mouth every 6 (six) hours.   docusate sodium 100 MG capsule Commonly known as: COLACE Take 1 capsule (100 mg total) by mouth 2 (two) times daily as needed for mild constipation.   fexofenadine 180 MG tablet Commonly known as: ALLEGRA Take 180 mg by mouth daily.  IRON PO Take 1 tablet by mouth daily.   LORazepam 0.5 MG tablet Commonly known as: ATIVAN Take 0.5 mg by mouth 2 (two) times daily as needed for anxiety.   metoprolol tartrate 50 MG tablet Commonly known as: LOPRESSOR Take 1 tablet (50 mg total) by mouth 2 (two) times daily.   oxyCODONE 5 MG immediate release tablet Commonly known as: Oxy IR/ROXICODONE Take 1 tablet (5 mg total) by mouth every 6 (six) hours as needed for breakthrough pain.   pantoprazole 20 MG tablet Commonly known as: PROTONIX Take 20 mg by mouth daily.   polyethylene  glycol 17 g packet Commonly known as: MIRALAX / GLYCOLAX Take 17 g by mouth daily as needed.   RED YEAST RICE PO Take 1 tablet by mouth daily.   VITAMIN D PO Take 1 capsule by mouth daily.            Durable Medical Equipment  (From admission, onward)         Start     Ordered   05/20/21 0749  For home use only DME oxygen  Once       Question Answer Comment  Length of Need 6 Months   Mode or (Route) Nasal cannula   Liters per Minute 3   Frequency Continuous (stationary and portable oxygen unit needed)   Oxygen delivery system Gas      05/20/21 0748   05/16/21 1748  For home use only DME Walker rolling  Once       Question Answer Comment  Walker: With [place] Wheels   Patient needs a walker to treat with the following condition Decreased functional mobility and endurance      05/16/21 1749   05/16/21 1748  For home use only DME 3 n 1  Once        05/16/21 1749   05/16/21 1748  For home use only DME lightweight manual wheelchair with seat cushion  Once       Comments: Patient suffers from closed fractures, ribs and flail chest and copd which impairs their ability to perform daily activities like ADLs in the home.  A RW will not resolve  issue with performing activities of daily living. A wheelchair will allow patient to safely perform daily activities. Patient is not able to propel themselves in the home using a standard weight wheelchair due to weakness. Patient can self propel in the lightweight wheelchair. Length of need 6 months. Accessories: elevating leg rests (ELRs), wheel locks, extensions and anti-tippers.   05/16/21 1749   05/16/21 1747  For home use only DME Hospital bed  Once       Question Answer Comment  Length of Need 6 Months   Patient has (list medical condition): closed fractures, ribs - flail chest - copd   The above medical condition requires: Patient requires the ability to reposition frequently   Head must be elevated greater than: 30 degrees   Bed  type Semi-electric   Support Surface: Gel Overlay      05/16/21 1749            Follow-up Information    Adrian Prows, MD. Schedule an appointment as soon as possible for a visit.   Specialty: Cardiology Why: To be seen in 4-6 weeks for follow up atrial fibrillation Contact information: [address] [place] [address] [place], [place] Follow up.   Specialty: Home Health Services Why: (Formerly  [facility]) Home health aide, physical and occupational therapy to follow up with you at home.  Contact information: [address] [place] [address] [place] Follow up.   Why: Phone: [phone] Equipment and oxygen needs       [place], Betty G, MD. Call in 1 day(s).   Specialty: Family Medicine Why: To arrange post hospital follow up and follow up for home oxygen Contact information: [place] [address] [phone]        [place]. Go on 06/10/2021.   Why: 940am. Please arrive 30 minutes prior to your appointment for paperwork. Please bring a copy of your photo ID and insurance card.  Contact information: [place] [id number] [place]. Go on 06/09/2021.   Why: Please go during busniess hours on 06/09/21 for a chest xray. Contact information: [place] [address] [phone]               Signed: Alferd Apa, [facility] Surgery 05/20/2021, 1:02 PM Please see Amion for pager number during day hours 7:00am-4:30pm

## 2021-05-20 NOTE — Progress Notes (Addendum)
Subjective: CC: Patient reports that the pain in her right ribs is better controlled after adjustments that were made to her medication regimen yesterday. Having some mild right rib pain this morning. No SOB. On 3L. Pulling 750 on IS. Tolerating diet without abdominal pain, n/v. Passing flatus. Last BM 5/24. Voiding.   Objective: Vital signs in last 24 hours: Temp:  [97.7 F (36.5 C)-98.5 F (36.9 C)] 98 F (36.7 C) (05/26 0800) Pulse Rate:  [76-97] 94 (05/26 0800) Resp:  [13-20] 13 (05/26 0800) BP: (96-127)/(44-83) 127/83 (05/26 0800) SpO2:  [96 %-99 %] 96 % (05/26 0800) Last BM Date: 05/18/21  Intake/Output from previous day: 05/25 0701 - 05/26 0700 In: 290 [P.O.:290] Out: 200 [Urine:200] Intake/Output this shift: Total I/O In: 200 [P.O.:200] Out: -   PE: General: pleasant, WD/WN whitefemale who is laying in bed inNAD HEENT: head is normocephalic, atraumatic. Sclera are noninjected. PERRL. Ears and nose without any masses or lesions. Mouth is pink and moist. Dentition fair Heart:Irregularrhythm with regular rate.Palpable radial andpedal pulses bilaterally  Lungs:CTA b/l. No wheezes or crackles. On 3L.Normal rate and effort. No accessory muscle use. Able to talk in full sentences.Pulling 750 on IS.CT site with bandage in place, c/d/i. Thoracentesis site with bandage in place, c/d/i.  XFG:HWEX, NT/ND, +BS MS:No BUE/BLE edema, calves soft and nontender Skin: warm and dry with no masses, lesions, or rashes Psych: A&Ox4 with an appropriate affect  Lab Results:  Recent Labs    05/18/21 0553  WBC 8.4  HGB 10.6*  HCT 32.2*  PLT 392   BMET Recent Labs    05/17/21 0921 05/18/21 0553  NA 135 135  K 3.3* 4.5  CL 96* 93*  CO2 32 35*  GLUCOSE 106* 106*  BUN 17 19  CREATININE 0.69 0.77  CALCIUM 9.3 9.0   PT/INR No results for input(s): LABPROT, INR in the last 72 hours. CMP     Component Value Date/Time   NA 135 05/18/2021 0553   K 4.5  05/18/2021 0553   CL 93 (L) 05/18/2021 0553   CO2 35 (H) 05/18/2021 0553   GLUCOSE 106 (H) 05/18/2021 0553   BUN 19 05/18/2021 0553   CREATININE 0.77 05/18/2021 0553   CALCIUM 9.0 05/18/2021 0553   PROT 6.7 05/11/2021 0035   ALBUMIN 3.6 05/11/2021 0035   AST 25 05/11/2021 0035   ALT 13 05/11/2021 0035   ALKPHOS 54 05/11/2021 0035   BILITOT 0.5 05/11/2021 0035   GFRNONAA >60 05/18/2021 0553   Lipase  No results found for: LIPASE     Studies/Results: DG CHEST PORT 1 VIEW  Result Date: 05/20/2021 CLINICAL DATA:  Pneumothorax.  Rib fractures. EXAM: PORTABLE CHEST 1 VIEW COMPARISON:  05/19/2021.  CT 05/11/2021. FINDINGS: Mediastinum and hilar structures normal. Heart size normal. Persistent right base atelectasis/infiltrate. Small right pleural effusion again noted. No pneumothorax noted on today's exam. Thoracic fractures best identified by prior CT. IMPRESSION: Persistent right base atelectasis/infiltrate. Small right pleural effusion again noted. No pneumothorax noted on today's exam. Electronically Signed   By: Marcello Moores  Register   On: 05/20/2021 07:07   DG CHEST PORT 1 VIEW  Result Date: 05/19/2021 CLINICAL DATA:  Shortness of breath.  Rib fracture. EXAM: PORTABLE CHEST 1 VIEW COMPARISON:  Chest x-ray 05/18/2021, CT chest 05/11/2021 FINDINGS: The heart size and mediastinal contours are within normal limits. Biapical pleural/pulmonary scarring. No focal consolidation. No pulmonary edema. Interval increase in trace to small volume right pleural effusion. Redemonstration of right  mid to lower lung zone airspace opacity. Persistent trace right pneumothorax. No left pneumothorax. No acute osseous abnormality. IMPRESSION: 1. Interval increase in trace to small volume right pleural fluid. 2. Persistent trace right pneumothorax. 3. Right mid to lower lung zone airspace opacity again noted. Electronically Signed   By: Iven Finn M.D.   On: 05/19/2021 05:47   ECHOCARDIOGRAM LIMITED  Result  Date: 05/18/2021    ECHOCARDIOGRAM LIMITED REPORT   Patient Name:   Melanie [name] Brigandi Date of Exam: 05/18/2021 Medical Rec #:  [id number]         Height:       68.0 in Accession #:    [id number]        Weight:       135.0 lb Date of Birth:  1941-03-24          BSA:          1.729 m Patient Age:    80 years          BP:           105/47 mmHg Patient Gender: F                 HR:           83 bpm. Exam Location:  Inpatient Procedure: Limited Echo Indications:     Atrial Fibrillation I48.91  History:         Patient has no prior history of Echocardiogram examinations.                  Risk Factors:Hypertension.  Sonographer:     Darlina Sicilian RDCS Referring Phys:  [place] Diagnosing Phys: Adrian Prows MD  Sonographer Comments: Exam ended per patients request, the exam was causing too much pain. IMPRESSIONS  1. Grossly preserved LVEF by image 16. . Left ventricular endocardial border not optimally defined to evaluate regional wall motion. Left ventricular diastolic function could not be evaluated.  2. Right ventricular systolic function was not well visualized.  3. Grossly normal sized.  4. Grossly no pericardial efffusion.  5. The mitral valve was not well visualized. No evidence of mitral valve regurgitation.  6. The aortic valve was not well visualized. Conclusion(s)/Recommendation(s): Poor echo window, patient with flail chest and hydropneumothorax involving the right and difficulty image. Grossly left ventricular systolic function appears preserved and the left atrium appears grossly normal sized. FINDINGS  Left Ventricle: Grossly preserved LVEF by image 16. Left ventricular endocardial border not optimally defined to evaluate regional wall motion. Left ventricular diastolic function could not be evaluated. Right Ventricle: Right ventricular systolic function was not well visualized. Left Atrium: Grossly normal sized. Left atrial size was not well visualized. Right Atrium: Right atrial size was not well  visualized. Pericardium: Grossly no pericardial efffusion. The pericardium was not well visualized. Mitral Valve: The mitral valve was not well visualized. Tricuspid Valve: The tricuspid valve is not well visualized. Aortic Valve: The aortic valve was not well visualized. Pulmonic Valve: The pulmonic valve was not assessed. Aorta: The aortic root was not well visualized. Venous: The inferior vena cava was not well visualized. LEFT VENTRICLE PLAX 2D LVOT diam:     1.90 cm LVOT Area:     2.84 cm   AORTA Ao Root diam: 3.40 cm  SHUNTS Systemic Diam: 1.90 cm Adrian Prows MD Electronically signed by Adrian Prows MD Signature Date/Time: 05/18/2021/9:20:31 PM    Final     Anti-infectives: Anti-infectives (From admission, onward)   None  Assessment/Plan GLF Segmental R rib FX 6-10 with PTXand traumatic pneumatoceles- CT out 5/21.Retained R pleural effusion after CT pulled. Thoracentesis 5/23 by IR w/ 900cc bloody fluid.CXR today w/ resolution of PTX and small R pleural effusion that appears small and stable. PT/OT.  Acute hypercarbic respiratory failure-Extubated5/17. On 3L. Down to 3L w/ mobilization with PT yesterday. Home o2 ordered. Has a PCP, Dr. Betty Martinique.  COPD- duoneb PRN. No wheezing on exam today.  HTN- home losartand/c'd by cards 5/23.Started on scheduledmetoprololand cardizem during admission for A. Fib A. Fib-RVR resolved. A. Fib new onset during admission. Cards consult 5/23, Dr. Einar Gip. They suspect A. Fib is 2/2 trauma. Started on scheduled metoprolol and cardizem. Echo limited as stopped 2/2 pain but with persevered LVEF per report. Do not recommend anticoagulation. HR improved.  Hyponatremia -resolved 5/24 labs  FEN-Soft,ensure, SLIV VTE- SCDs, Lovenox ID - None currently. Afebrile. No cough. WBC wnl on 5/24.  Foley - None currently.Voiding. Dispo- PT/OT.Recommending HH w/ 24H supervision. DME and [facility] written for.With patients permission, I spoke with grandson  who confirmed 24/7 supervision. She plans to stay with son and family able to arrange 24 hour supervision w/ patients son and wife who are retired. All questions answered. He asked that PT reach out about some specific questions he had for them. I will pass along the message. Plan for d/c later today after mobilizing with therapies.     LOS: 9 days    Jillyn Ledger , [facility] 05/20/2021, 9:05 AM Please see Amion for pager number during day hours 7:00am-4:30pm

## 2021-05-20 NOTE — TOC Transition Note (Signed)
Transition of Care [facility]) - CM/SW Discharge Note   Patient Details  Name: Melanie [name] MRN: [id number] Date of Birth: 12-31-1940  Transition of Care [facility]) CM/SW Contact:  Ella Bodo, RN Phone Number: 05/20/2021, 3:32 PM   Clinical Narrative: Pt medically stable for discharge home today; she plans to dc to her son's home in [place].  DC address is [address] [place], [place]; [employer], phone [phone].  Melene Muller with [employer] has confirmed that all dc equipment will be delivered to son's home this afternoon.  Portable oxygen tank has been delivered to pt's room for transport home, and bedside nurse, Barnett Applebaum, is aware.  [facility] is aware of patient discharge today, and dc address given to home health liaison.  Family plans to transport pt home via private vehicle with portable oxygen tank.        Final next level of care: [place] Barriers to Discharge: Barriers Resolved   Patient Goals and CMS Choice Patient states their goals for this hospitalization and ongoing recovery are:: return home with family support CMS Medicare.gov Compare Post Acute Care list provided to:: Patient Choice offered to / list presented to : [place]                      Discharge Plan and Services   Discharge Planning Services: Follow-up appt scheduled Post Acute Care Choice: Home Health,Durable Medical Equipment          DME Arranged: 3-N-1,Hospital bed,Lightweight manual wheelchair with seat cushion,Walker rolling,Oxygen DME Agency: [employer] Date DME Agency Contacted: 05/16/21 Time DME Agency Contacted: 0737 Representative spoke with at DME Agency: Brenton Grills HH Arranged: PT,OT,Nurse's Aide [place]: [place] Date [place]: 05/19/21 Time [place]: 1200 Representative spoke with at [place]: Freddi Starr  Social Determinants of Health (SDOH) Interventions     Readmission Risk  Interventions Readmission Risk Prevention Plan 05/20/2021 05/20/2021  Post Dischage Appt Complete Complete  Medication Screening Complete Complete  Transportation Screening Complete Complete   Reinaldo Raddle, RN, BSN  Trauma/Neuro ICU Case Manager [phone]

## 2021-05-20 NOTE — Progress Notes (Signed)
Occupational Therapy Treatment Patient Details Name: Melanie [name] MRN: [id number] DOB: 1941-07-30 Today's Date: 05/20/2021    History of present illness Pt is an 80 y.o. female who presented 5/17 as a level 1 trauma due to right chest wall crepitus, hypoxia, and HTN following falling out of bed onto her R side. Pt found to have R hemopneumothorax and R rib fxs 6-10. R chest tube placed 5/17-5/21. ETT 5/17-5/17. PMH: COPD.   OT comments  Pt unable to don sock this date due to increased pain, but reports her family will be able to assist her as necessary.  She was able to perform toilet transfers with min guard assist and grooming at sink with min guard assist with Sp02 >91% on 5L once pt returned to sitting as pleth was inaccurate while pt moving.  Pt was instructed in use of 3in1 commode for use of toilet and as a shower seat.  Recommend HHOT   Follow Up Recommendations  Home health OT;Supervision/Assistance - 24 hour    Equipment Recommendations  3 in 1 bedside commode;Wheelchair (measurements OT);Wheelchair cushion (measurements OT)    Recommendations for Other Services      Precautions / Restrictions Precautions Precautions: Fall Precaution Comments: monitor sats, urinary incontinence       Mobility Bed Mobility               General bed mobility comments: up in chair    Transfers Overall transfer level: Needs assistance Equipment used: Rolling walker (2 wheeled) Transfers: Sit to/from [facility] Sit to Stand: Min guard Stand pivot transfers: Min guard       General transfer comment: min guard assist for safety and verbal cues for hand placement    Balance Overall balance assessment: Needs assistance Sitting-balance support: No upper extremity supported;Feet supported Sitting balance-Leahy Scale: Good     Standing balance support: Single extremity supported;During functional activity Standing balance-Leahy Scale: Poor Standing balance  comment: reliant on UE support                           ADL either performed or assessed with clinical judgement   ADL Overall ADL's : Needs assistance/impaired     Grooming: Wash/dry hands;Independent;Min guard;Standing               Lower Body Dressing: Moderate assistance;Sit to/from stand Lower Body Dressing Details (indicate cue type and reason): able to doff socks, but unable to don them this session - she reports her family will assist her.  She was able to simulate donning pants Toilet Transfer: Min guard;Ambulation;Comfort height toilet;Grab bars;RW Armed forces technical officer Details (indicate cue type and reason): required use of grab bar Toileting- Clothing Manipulation and Hygiene: Min guard;Sit to/from stand       Functional mobility during ADLs: Passenger transport manager     Praxis      Cognition Arousal/Alertness: Awake/alert Behavior During Therapy: WFL for tasks assessed/performed Overall Cognitive Status: No family/caregiver present to determine baseline cognitive functioning                                 General Comments: Pt is HOH which makes it difficult to accurately assess cognition.  Pt requires cuing to initiate tasks of her - could be due to hearing loss.  She requires min cues for problem  solving        Exercises     Shoulder Instructions       General Comments HR 127 with activity.  Sp02 difficult to obtain during activity due to poor pleth.  Once nelcor probe changed and pt sitting sp02 >91% with pt on 5L 02    Pertinent Vitals/ Pain       Pain Assessment: 0-10 Pain Score: 8  Pain Location: Rt ribs Pain Descriptors / Indicators: Discomfort;Grimacing;Guarding;Moaning Pain Intervention(s): Limited activity within patient's tolerance;Repositioned;Patient requesting pain meds-RN notified;RN gave pain meds during session  Home Living                                           Prior Functioning/Environment              Frequency  Min 2X/week        Progress Toward Goals  OT Goals(current goals can now be found in the care plan section)  Progress towards OT goals: Progressing toward goals     Plan Discharge plan remains appropriate    Co-evaluation                 AM-PAC OT "6 Clicks" Daily Activity     Outcome Measure   Help from another person eating meals?: None Help from another person taking care of personal grooming?: A Little Help from another person toileting, which includes using toliet, bedpan, or urinal?: A Little Help from another person bathing (including washing, rinsing, drying)?: A Lot Help from another person to put on and taking off regular upper body clothing?: A Little Help from another person to put on and taking off regular lower body clothing?: A Lot 6 Click Score: 17    End of Session Equipment Utilized During Treatment: Oxygen  OT Visit Diagnosis: Unsteadiness on feet (R26.81);Pain;Repeated falls (R29.6) Pain - Right/Left: Right   Activity Tolerance Patient limited by pain   Patient Left in chair;with call bell/phone within reach;with chair alarm set;with nursing/sitter in room   Nurse Communication Mobility status        Time: 1517-6160 OT Time Calculation (min): 30 min  Charges: OT General Charges $OT Visit: 1 Visit OT Treatments $Self Care/Home Management : 23-37 mins  Nilsa Nutting OTR/L Acute Rehabilitation Services Pager [phone] Office [phone]    Lucille Passy M 05/20/2021, 3:26 PM

## 2021-05-20 NOTE — Discharge Instructions (Signed)
PNEUMOTHORAX OR HEMOTHORAX +/- RIB FRACTURES  HOME INSTRUCTIONS   1. PAIN CONTROL:  1. Pain is best controlled by a usual combination of three different methods TOGETHER:  i. Ice/Heat ii. Over the counter pain medication iii. Prescription pain medication 2. You may experience some swelling and bruising in area of broken ribs. Ice packs or heating pads (30-60 minutes up to 6 times a day) will help. Use ice for the first few days to help decrease swelling and bruising, then switch to heat to help relax tight/sore spots and speed recovery. Some people prefer to use ice alone, heat alone, alternating between ice & heat. Experiment to what works for you. Swelling and bruising can take several weeks to resolve.  3. It is helpful to take an over-the-counter pain medication regularly for the first few weeks. Choose one of the following that works best for you:  i. Naproxen (Aleve, etc) Two 220mg tabs twice a day ii. Ibuprofen (Advil, etc) Three 200mg tabs four times a day (every meal & bedtime) iii. Acetaminophen (Tylenol, etc) 500-650mg four times a day (every meal & bedtime) 4. A prescription for pain medication (such as oxycodone, hydrocodone, etc) may be given to you upon discharge. Take your pain medication as prescribed.  i. If you are having problems/concerns with the prescription medicine (does not control pain, nausea, vomiting, rash, itching, etc), please call us [phone] to see if we need to switch you to a different pain medicine that will work better for you and/or control your side effect better. ii. If you need a refill on your pain medication, please contact your pharmacy. They will contact our office to request authorization. Prescriptions will not be filled after 5 pm or on week-ends. 1. Avoid getting constipated. When taking pain medications, it is common to experience some constipation. Increasing fluid intake and taking a fiber supplement (such as Metamucil, Citrucel, FiberCon,  MiraLax, etc) 1-2 times a day regularly will usually help prevent this problem from occurring. A mild laxative (prune juice, Milk of Magnesia, MiraLax, etc) should be taken according to package directions if there are no bowel movements after 48 hours.  2. Watch out for diarrhea. If you have many loose bowel movements, simplify your diet to bland foods & liquids for a few days. Stop any stool softeners and decrease your fiber supplement. Switching to mild anti-diarrheal medications (Kayopectate, Pepto Bismol) can help. If this worsens or does not improve, please call us. 3. Chest tube site wound: you may remove the dressing from your chest tube site 3 days after the removal of your chest tube. DO NOT shower over the dressing. Once   removed, you may shower as normal. Do not submerge your wound in water for 2-3 weeks.  4. FOLLOW UP  a. Please call our office to set up or confirm an appointment for follow up for 2 weeks after discharge. You will need to get a chest xray at either [facility] or [facility]. This will be outlined in your follow up instructions. Please call [facility] at [phone] if you have any questions about follow up.  b. If you have any orthopedic or other injuries you will need to follow up as outlined in your follow up instructions.   WHEN TO CALL US [phone]:  1. Poor pain control 2. Reactions / problems with new medications (rash/itching, nausea, etc)  3. Fever over 101.5 F (38.5 C) 4. Worsening swelling or bruising 5. Redness, drainage, pain or swelling around chest   tube site 6. Worsening pain, productive cough, difficulty breathing or any other concerning symptoms  The clinic staff is available to answer your questions during regular business hours (8:30am-5pm). Please don't hesitate to call and ask to speak to one of our nurses for clinical concerns.  If you have a medical emergency, go to the nearest emergency room or call 911.  A surgeon from [facility] is always on call at the hospitals   [facility]  [address], [address], [place], [place] [address] ?  MAIN: [phone] ? TOLL FREE: [phone] ?  FAX [phone]  [email or web address]      Information on Rib Fractures  A rib fracture is a break or crack in one of the bones of the ribs. The ribs are long, curved bones that wrap around your chest and attach to your spine and your breastbone. The ribs protect your heart, lungs, and other organs in the chest. A broken or cracked rib is often painful but is not usually serious. Most rib fractures heal on their own over time. However, rib fractures can be more serious if multiple ribs are broken or if broken ribs move out of place and push against other structures or organs. What are the causes? This condition is caused by:  Repetitive movements with high force, such as pitching a baseball or having severe coughing spells.  A direct blow to the chest, such as a sports injury, a car accident, or a fall.  Cancer that has spread to the bones, which can weaken bones and cause them to break. What are the signs or symptoms? Symptoms of this condition include:  Pain when you breathe in or cough.  Pain when someone presses on the injured area.  Feeling short of breath. How is this diagnosed? This condition is diagnosed with a physical exam and medical history. Imaging tests may also be done, such as:  Chest X-ray.  CT scan.  MRI.  Bone scan.  Chest ultrasound. How is this treated? Treatment for this condition depends on the severity of the fracture. Most rib fractures usually heal on their own in 1-3 months. Sometimes healing takes longer if there is a cough that does not stop or if there are other activities that make the injury worse (aggravating factors). While you heal, you will be given medicines to control the pain. You will also be taught deep breathing  exercises. Severe injuries may require hospitalization or surgery. Follow these instructions at home: Managing pain, stiffness, and swelling  If directed, apply ice to the injured area. ? Put ice in a plastic bag. ? Place a towel between your skin and the bag. ? Leave the ice on for 20 minutes, 2-3 times a day.  Take over-the-counter and prescription medicines only as told by your health care provider. Activity  Avoid a lot of activity and any activities or movements that cause pain. Be careful during activities and avoid bumping the injured rib.  Slowly increase your activity as told by your health care provider. General instructions  Do deep breathing exercises as told by your health care provider. This helps prevent pneumonia, which is a common complication of a broken rib. Your health care provider may instruct you to: ? Take deep breaths several times a day. ? Try to cough several times a day, holding a pillow against the injured area. ? Use a device called incentive spirometer to practice deep breathing several times a day.  Drink enough   fluid to keep your urine pale yellow.  Do not wear a rib belt or binder. These restrict breathing, which can lead to pneumonia.  Keep all follow-up visits as told by your health care provider. This is important. Contact a health care provider if:  You have a fever. Get help right away if:  You have difficulty breathing or you are short of breath.  You develop a cough that does not stop, or you cough up thick or bloody sputum.  You have nausea, vomiting, or pain in your abdomen.  Your pain gets worse and medicine does not help. Summary  A rib fracture is a break or crack in one of the bones of the ribs.  A broken or cracked rib is often painful but is not usually serious.  Most rib fractures heal on their own over time.  Treatment for this condition depends on the severity of the fracture.  Avoid a lot of activity and any  activities or movements that cause pain. This information is not intended to replace advice given to you by your health care provider. Make sure you discuss any questions you have with your health care provider. Document Released: 12/12/2005 Document Revised: 03/13/2017 Document Reviewed: 03/13/2017 Elsevier Interactive Patient Education  2019 [employer]    Pneumothorax A pneumothorax is commonly called a collapsed lung. It is a condition in which air leaks from a lung and builds up between the thin layer of tissue that covers the lungs (visceral pleura) and the interior wall of the chest cavity (parietal pleura). The air gets trapped outside the lung, between the lung and the chest wall (pleural space). The air takes up space and prevents the lung from fully expanding. This condition sometimes occurs suddenly with no apparent cause. The buildup of air may be small or large. A small pneumothorax may go away on its own. A large pneumothorax will require treatment and hospitalization. What are the causes? This condition may be caused by:  Trauma and injury to the chest wall.  Surgery and other medical procedures.  A complication of an underlying lung problem, especially chronic obstructive pulmonary disease (COPD) or emphysema. Sometimes the cause of this condition is not known. What increases the risk? You are more likely to develop this condition if:  You have an underlying lung problem.  You smoke.  You are 20-40 years old, female, tall, and underweight.  You have a personal or family history of pneumothorax.  You have an eating disorder (anorexia nervosa). This condition can also happen quickly, even in people with no history of lung problems. What are the signs or symptoms? Sometimes a pneumothorax will have no symptoms. When symptoms are present, they can include:  Chest pain.  Shortness of breath.  Increased rate of breathing.  Bluish color to your lips or skin  (cyanosis). How is this diagnosed? This condition may be diagnosed by:  A medical history and physical exam.  A chest X-ray, chest CT scan, or ultrasound. How is this treated? Treatment depends on how severe your condition is. The goal of treatment is to remove the extra air and allow your lung to expand back to its normal size.  For a small pneumothorax: ? No treatment may be needed. ? Extra oxygen is sometimes used to make it go away more quickly.  For a large pneumothorax or a pneumothorax that is causing symptoms, a procedure is done to drain the air from your lungs. To do this, a health care provider may   use: ? A needle with a syringe. This is used to suck air from a pleural space where no additional leakage is taking place. ? A chest tube. This is used to suck air where there is ongoing leakage into the pleural space. The chest tube may need to remain in place for several days until the air leak has healed.  In more severe cases, surgery may be needed to repair the damage that is causing the leak.  If you have multiple pneumothorax episodes or have an air leak that will not heal, a procedure called a pleurodesis may be done. A medicine is placed in the pleural space to irritate the tissues around the lung so that the lung will stick to the chest wall, seal any leaks, and stop any buildup of air in that space. If you have an underlying lung problem, severe symptoms, or a large pneumothorax you will usually need to stay in the hospital. Follow these instructions at home: Lifestyle  Do not use any products that contain nicotine or tobacco, such as cigarettes and e-cigarettes. These are major risk factors in pneumothorax. If you need help quitting, ask your health care provider.  Do not lift anything that is heavier than 10 lb (4.5 kg), or the limit that your health care provider tells you, until he or she says that it is safe.  Avoid activities that take a lot of effort (strenuous)  for as long as told by your health care provider.  Return to your normal activities as told by your health care provider. Ask your health care provider what activities are safe for you.  Do not fly in an airplane or scuba dive until your health care provider says it is okay. General instructions  Take over-the-counter and prescription medicines only as told by your health care provider.  If a cough or pain makes it difficult for you to sleep at night, try sleeping in a semi-upright position in a recliner or by using 2 or 3 pillows.  If you had a chest tube and it was removed, ask your health care provider when you can remove the bandage (dressing). While the dressing is in place, do not allow it to get wet.  Keep all follow-up visits as told by your health care provider. This is important. Contact a health care provider if:  You cough up thick mucus (sputum) that is yellow or green in color.  You were treated with a chest tube, and you have redness, increasing pain, or discharge at the site where it was placed. Get help right away if:  You have increasing chest pain or shortness of breath.  You have a cough that will not go away.  You begin coughing up blood.  You have pain that is getting worse or is not controlled with medicines.  The site where your chest tube was located opens up.  You feel air coming out of the site where the chest tube was placed.  You have a fever or persistent symptoms for more than 2-3 days.  You have a fever and your symptoms suddenly get worse. These symptoms may represent a serious problem that is an emergency. Do not wait to see if the symptoms will go away. Get medical help right away. Call your local emergency services (911 in the [place] Do not drive yourself to the hospital. Summary  A pneumothorax, commonly called a collapsed lung, is a condition in which air leaks from a lung and gets trapped between the   lung and the chest wall (pleural  space).  The buildup of air may be small or large. A small pneumothorax may go away on its own. A large pneumothorax will require treatment and hospitalization.  Treatment for this condition depends on how severe the pneumothorax is. The goal of treatment is to remove the extra air and allow the lung to expand back to its normal size. This information is not intended to replace advice given to you by your health care provider. Make sure you discuss any questions you have with your health care provider. Document Released: 12/12/2005 Document Revised: 11/20/2017 Document Reviewed: 11/20/2017 Elsevier Interactive Patient Education  2019 [employer].   Atrial Fibrillation  Atrial fibrillation is a type of irregular or rapid heartbeat (arrhythmia). In atrial fibrillation, the top part of the heart (atria) beats in an irregular pattern. This makes the heart unable to pump blood normally and effectively. The goal of treatment is to prevent blood clots from forming, control your heart rate, or restore your heartbeat to a normal rhythm. If this condition is not treated, it can cause serious problems, such as a weakened heart muscle (cardiomyopathy) or a stroke. What are the causes? This condition is often caused by medical conditions that damage the heart's electrical system. These include:  High blood pressure (hypertension). This is the most common cause.  Certain heart problems or conditions, such as heart failure, coronary artery disease, heart valve problems, or heart surgery.  Diabetes.  Overactive thyroid (hyperthyroidism).  Obesity.  Chronic kidney disease. In some cases, the cause of this condition is not known. What increases the risk? This condition is more likely to develop in:  Older people.  People who smoke.  Athletes who do endurance exercise.  People who have a family history of atrial fibrillation.  Men.  People who use drugs.  People who drink a lot of  alcohol.  People who have lung conditions, such as emphysema, pneumonia, or COPD.  People who have obstructive sleep apnea. What are the signs or symptoms? Symptoms of this condition include:  A feeling that your heart is racing or beating irregularly.  Discomfort or pain in your chest.  Shortness of breath.  Sudden light-headedness or weakness.  Tiring easily during exercise or activity.  Fatigue.  Syncope (fainting).  Sweating. In some cases, there are no symptoms. How is this diagnosed? Your health care provider may detect atrial fibrillation when taking your pulse. If detected, this condition may be diagnosed with:  An electrocardiogram (ECG) to check electrical signals of the heart.  An ambulatory cardiac monitor to record your heart's activity for a few days.  A transthoracic echocardiogram (TTE) to create pictures of your heart.  A transesophageal echocardiogram (TEE) to create even closer pictures of your heart.  A stress test to check your blood supply while you exercise.  Imaging tests, such as a CT scan or chest X-ray.  Blood tests. How is this treated? Treatment depends on underlying conditions and how you feel when you experience atrial fibrillation. This condition may be treated with:  Medicines to prevent blood clots or to treat heart rate or heart rhythm problems.  Electrical cardioversion to reset the heart's rhythm.  A pacemaker to correct abnormal heart rhythm.  Ablation to remove the heart tissue that sends abnormal signals.  Left atrial appendage closure to seal the area where blood clots can form. In some cases, underlying conditions will be treated. Follow these instructions at home: Medicines  Take over-the counter and  prescription medicines only as told by your health care provider.  Do not take any new medicines without talking to your health care provider.  If you are taking blood thinners: ? Talk with your health care provider  before you take any medicines that contain aspirin or NSAIDs, such as ibuprofen. These medicines increase your risk for dangerous bleeding. ? Take your medicine exactly as told, at the same time every day. ? Avoid activities that could cause injury or bruising, and follow instructions about how to prevent falls. ? Wear a medical alert bracelet or carry a card that lists what medicines you take. Lifestyle  Do not use any products that contain nicotine or tobacco, such as cigarettes, e-cigarettes, and chewing tobacco. If you need help quitting, ask your health care provider.  Eat heart-healthy foods. Talk with a dietitian to make an eating plan that is right for you.  Exercise regularly as told by your health care provider.  Do not drink alcohol.  Lose weight if you are overweight.  Do not use drugs, including cannabis.      General instructions  If you have obstructive sleep apnea, manage your condition as told by your health care provider.  Do not use diet pills unless your health care provider approves. Diet pills can make heart problems worse.  Keep all follow-up visits as told by your health care provider. This is important. Contact a health care provider if you:  Notice a change in the rate, rhythm, or strength of your heartbeat.  Are taking a blood thinner and you notice more bruising.  Tire more easily when you exercise or do heavy work.  Have a sudden change in weight. Get help right away if you have:  Chest pain, abdominal pain, sweating, or weakness.  Trouble breathing.  Side effects of blood thinners, such as blood in your vomit, stool, or urine, or bleeding that cannot stop.  Any symptoms of a stroke. "BE FAST" is an easy way to remember the main warning signs of a stroke: ? B - Balance. Signs are dizziness, sudden trouble walking, or loss of balance. ? E - Eyes. Signs are trouble seeing or a sudden change in vision. ? F - Face. Signs are sudden weakness or  numbness of the face, or the face or eyelid drooping on one side. ? A - Arms. Signs are weakness or numbness in an arm. This happens suddenly and usually on one side of the body. ? S - Speech. Signs are sudden trouble speaking, slurred speech, or trouble understanding what people say. ? T - Time. Time to call emergency services. Write down what time symptoms started.  Other signs of a stroke, such as: ? A sudden, severe headache with no known cause. ? Nausea or vomiting. ? Seizure. These symptoms may represent a serious problem that is an emergency. Do not wait to see if the symptoms will go away. Get medical help right away. Call your local emergency services (911 in the [place] Do not drive yourself to the hospital.   Summary  Atrial fibrillation is a type of irregular or rapid heartbeat (arrhythmia).  Symptoms include a feeling that your heart is beating fast or irregularly.  You may be given medicines to prevent blood clots or to treat heart rate or heart rhythm problems.  Get help right away if you have signs or symptoms of a stroke.  Get help right away if you cannot catch your breath or have chest pain or pressure. This information  is not intended to replace advice given to you by your health care provider. Make sure you discuss any questions you have with your health care provider. Document Revised: 06/05/2019 Document Reviewed: 06/05/2019 Elsevier Patient Education  [place].

## 2021-05-20 NOTE — Progress Notes (Signed)
Physical Therapy Treatment Patient Details Name: Melanie [name] MRN: [id number] DOB: Oct 22, 1941 Today's Date: 05/20/2021    History of Present Illness Pt is an 80 y.o. female who presented 5/17 as a level 1 trauma due to right chest wall crepitus, hypoxia, and HTN following falling out of bed onto her R side. Pt found to have R hemopneumothorax and R rib fxs 6-10. R chest tube placed 5/17-5/21. ETT 5/17-5/17. PMH: COPD.    PT Comments    The pt was able to make good progress with PT this session, despite continued limitations of pain from R rib fx. The pt was able to complete x2 steps with use of single rail and minA through [place], but reports significant fatigue and required seated rest following activity to recover. The pt also was able to demo progress with activity tolerance, completing 20 ft bout of ambulation after stair navigation with SpO2 steady on 4L O2. The pt was educated on continuing x3/day walking program to further progress ambulation distance and endurance, and verbalized understanding. The pt's son Louie Casa was called after the session to discuss assistance and supervision needed for safety with mobility, use of DME, home walking program, and fall risk reduction in the home. The pt and her son verbalized understanding to all education, will be safe to d/c home with family and HHPT services.     Follow Up Recommendations  Home health PT;Supervision/Assistance - 24 hour     Equipment Recommendations  Rolling walker with 5" wheels;3in1 (PT);Wheelchair (measurements PT);Wheelchair cushion (measurements PT)    Recommendations for Other Services       Precautions / Restrictions Precautions Precautions: Fall Precaution Comments: monitor sats, urinary incontinence Restrictions Weight Bearing Restrictions: No    Mobility  Bed Mobility               General bed mobility comments: Received sitting in chair    Transfers Overall transfer level: Needs assistance Equipment  used: Rolling walker (2 wheeled) Transfers: Sit to/from Stand Sit to Stand: Min guard         General transfer comment: Min guard to rise from recliner, x4 through  session  Ambulation/Gait Ambulation/Gait assistance: Min guard Gait Distance (Feet): 20 Feet Assistive device: Rolling walker (2 wheeled) Gait Pattern/deviations: Step-through pattern;Decreased stride length;Trunk flexed Gait velocity: decreased Gait velocity interpretation: <1.31 ft/sec, indicative of household ambulator General Gait Details: Very slow pace, cues for elongating stride, fatigues easily. Min guard overall for stability. SpO2 steady on 4L O2   Stairs Stairs: Yes Stairs assistance: Min assist Stair Management: One rail Left;Step to pattern;Forwards Number of Stairs: 2 General stair comments: pt fatigues easily, minA through [facility] to steady in addition to L rail. no overt LOB   Wheelchair Mobility    Modified Rankin (Stroke Patients Only) Modified Rankin (Stroke Patients Only) Pre-Morbid Rankin Score: No symptoms Modified Rankin: Moderately severe disability     Balance Overall balance assessment: Needs assistance Sitting-balance support: No upper extremity supported;Feet supported Sitting balance-Leahy Scale: Fair Sitting balance - Comments: Min guard for safety.   Standing balance support: Bilateral upper extremity supported Standing balance-Leahy Scale: Poor Standing balance comment: Reliant on bil UE support and external assist.                            Cognition Arousal/Alertness: Awake/alert Behavior During Therapy: WFL for tasks assessed/performed Overall Cognitive Status: Impaired/Different from baseline Area of Impairment: Problem solving;Memory  Memory: Decreased short-term memory   Safety/Judgement: Decreased awareness of safety;Decreased awareness of deficits   Problem Solving: Slow processing;Decreased initiation;Difficulty  sequencing;Requires verbal cues General Comments: cues for safety, pt internally distracted by pain      Exercises      General Comments        Pertinent Vitals/Pain Pain Assessment: Faces Faces Pain Scale: Hurts little more Pain Location: R side Pain Descriptors / Indicators: Discomfort;Grimacing;Guarding Pain Intervention(s): Limited activity within patient's tolerance;Monitored during session;Repositioned           PT Goals (current goals can now be found in the care plan section) Acute Rehab PT Goals Patient Stated Goal: less pain PT Goal Formulation: With patient/family Time For Goal Achievement: 05/26/21 Potential to Achieve Goals: Good Progress towards PT goals: Progressing toward goals    Frequency    Min 3X/week      PT Plan Current plan remains appropriate       AM-PAC PT "6 Clicks" Mobility   Outcome Measure  Help needed turning from your back to your side while in a flat bed without using bedrails?: A Little Help needed moving from lying on your back to sitting on the side of a flat bed without using bedrails?: A Little Help needed moving to and from a bed to a chair (including a wheelchair)?: A Little Help needed standing up from a chair using your arms (e.g., wheelchair or bedside chair)?: A Little Help needed to walk in hospital room?: A Little Help needed climbing 3-5 steps with a railing? : A Little 6 Click Score: 18    End of Session Equipment Utilized During Treatment: Gait belt;Oxygen Activity Tolerance: Patient tolerated treatment well Patient left: with call bell/phone within reach;in chair;with chair alarm set Nurse Communication: Mobility status PT Visit Diagnosis: Unsteadiness on feet (R26.81);Other abnormalities of gait and mobility (R26.89);Muscle weakness (generalized) (M62.81);History of falling (Z91.81);Difficulty in walking, not elsewhere classified (R26.2);Pain Pain - Right/Left: Right     Time: 3893-7342 PT Time  Calculation (min) (ACUTE ONLY): 28 min  Charges:  $Gait Training: 8-22 mins $Therapeutic Activity: 8-22 mins                     Karma Ganja, PT, DPT   Acute Rehabilitation Department Pager #: [phone]   Otho Bellows 05/20/2021, 11:55 AM

## 2021-05-20 NOTE — Plan of Care (Signed)
Patient will be going home with son. Equipment is at home and oxygen is going with her from hospital.

## 2021-05-21 ENCOUNTER — Encounter: Payer: Self-pay | Admitting: Family Medicine

## 2021-05-25 ENCOUNTER — Ambulatory Visit (HOSPITAL_COMMUNITY)
Admission: EM | Admit: 2021-05-25 | Discharge: 2021-05-25 | Disposition: A | Discharge status: Home / Self Care | Payer: Medicare Other

## 2021-05-25 ENCOUNTER — Encounter (HOSPITAL_COMMUNITY): Payer: Self-pay | Admitting: *Deleted

## 2021-05-25 ENCOUNTER — Other Ambulatory Visit: Payer: Self-pay

## 2021-05-25 ENCOUNTER — Encounter (HOSPITAL_COMMUNITY): Payer: Self-pay | Admitting: Emergency Medicine

## 2021-05-25 ENCOUNTER — Inpatient Hospital Stay (HOSPITAL_COMMUNITY)
Admission: EM | Admit: 2021-05-25 | Discharge: 2021-06-09 | DRG: 199 | Disposition: A | Discharge status: Hospice | Payer: Medicare Other | Attending: Internal Medicine | Admitting: Internal Medicine

## 2021-05-25 ENCOUNTER — Emergency Department (HOSPITAL_COMMUNITY): Payer: Medicare Other

## 2021-05-25 ENCOUNTER — Inpatient Hospital Stay: Payer: Medicare Other | Admitting: Family Medicine

## 2021-05-25 DIAGNOSIS — J9383 Other pneumothorax: Secondary | ICD-10-CM | POA: Diagnosis not present

## 2021-05-25 DIAGNOSIS — E042 Nontoxic multinodular goiter: Secondary | ICD-10-CM | POA: Diagnosis not present

## 2021-05-25 DIAGNOSIS — Z801 Family history of malignant neoplasm of trachea, bronchus and lung: Secondary | ICD-10-CM

## 2021-05-25 DIAGNOSIS — J449 Chronic obstructive pulmonary disease, unspecified: Secondary | ICD-10-CM | POA: Diagnosis present

## 2021-05-25 DIAGNOSIS — Z20822 Contact with and (suspected) exposure to covid-19: Secondary | ICD-10-CM | POA: Diagnosis present

## 2021-05-25 DIAGNOSIS — I4891 Unspecified atrial fibrillation: Secondary | ICD-10-CM

## 2021-05-25 DIAGNOSIS — R600 Localized edema: Secondary | ICD-10-CM | POA: Diagnosis not present

## 2021-05-25 DIAGNOSIS — F32A Depression, unspecified: Secondary | ICD-10-CM | POA: Diagnosis not present

## 2021-05-25 DIAGNOSIS — R627 Adult failure to thrive: Secondary | ICD-10-CM | POA: Diagnosis present

## 2021-05-25 DIAGNOSIS — J939 Pneumothorax, unspecified: Secondary | ICD-10-CM | POA: Diagnosis not present

## 2021-05-25 DIAGNOSIS — Z79899 Other long term (current) drug therapy: Secondary | ICD-10-CM

## 2021-05-25 DIAGNOSIS — N3281 Overactive bladder: Secondary | ICD-10-CM | POA: Diagnosis present

## 2021-05-25 DIAGNOSIS — S272XXA Traumatic hemopneumothorax, initial encounter: Principal | ICD-10-CM | POA: Diagnosis present

## 2021-05-25 DIAGNOSIS — Z85048 Personal history of other malignant neoplasm of rectum, rectosigmoid junction, and anus: Secondary | ICD-10-CM | POA: Diagnosis not present

## 2021-05-25 DIAGNOSIS — S270XXA Traumatic pneumothorax, initial encounter: Secondary | ICD-10-CM | POA: Diagnosis not present

## 2021-05-25 DIAGNOSIS — Z515 Encounter for palliative care: Secondary | ICD-10-CM | POA: Diagnosis not present

## 2021-05-25 DIAGNOSIS — W06XXXA Fall from bed, initial encounter: Secondary | ICD-10-CM | POA: Diagnosis present

## 2021-05-25 DIAGNOSIS — S225XXA Flail chest, initial encounter for closed fracture: Secondary | ICD-10-CM | POA: Diagnosis present

## 2021-05-25 DIAGNOSIS — Z87891 Personal history of nicotine dependence: Secondary | ICD-10-CM

## 2021-05-25 DIAGNOSIS — Z7189 Other specified counseling: Secondary | ICD-10-CM | POA: Diagnosis not present

## 2021-05-25 DIAGNOSIS — R0602 Shortness of breath: Secondary | ICD-10-CM | POA: Diagnosis not present

## 2021-05-25 DIAGNOSIS — Y92003 Bedroom of unspecified non-institutional (private) residence as the place of occurrence of the external cause: Secondary | ICD-10-CM

## 2021-05-25 DIAGNOSIS — F419 Anxiety disorder, unspecified: Secondary | ICD-10-CM | POA: Diagnosis present

## 2021-05-25 DIAGNOSIS — E7800 Pure hypercholesterolemia, unspecified: Secondary | ICD-10-CM | POA: Diagnosis present

## 2021-05-25 DIAGNOSIS — T451X5A Adverse effect of antineoplastic and immunosuppressive drugs, initial encounter: Secondary | ICD-10-CM | POA: Diagnosis present

## 2021-05-25 DIAGNOSIS — Z9689 Presence of other specified functional implants: Secondary | ICD-10-CM

## 2021-05-25 DIAGNOSIS — S2241XA Multiple fractures of ribs, right side, initial encounter for closed fracture: Secondary | ICD-10-CM | POA: Diagnosis not present

## 2021-05-25 DIAGNOSIS — E440 Moderate protein-calorie malnutrition: Secondary | ICD-10-CM | POA: Diagnosis not present

## 2021-05-25 DIAGNOSIS — I959 Hypotension, unspecified: Secondary | ICD-10-CM

## 2021-05-25 DIAGNOSIS — M7989 Other specified soft tissue disorders: Secondary | ICD-10-CM

## 2021-05-25 DIAGNOSIS — I10 Essential (primary) hypertension: Secondary | ICD-10-CM | POA: Diagnosis present

## 2021-05-25 DIAGNOSIS — J90 Pleural effusion, not elsewhere classified: Secondary | ICD-10-CM | POA: Diagnosis not present

## 2021-05-25 DIAGNOSIS — J439 Emphysema, unspecified: Secondary | ICD-10-CM | POA: Diagnosis not present

## 2021-05-25 DIAGNOSIS — J948 Other specified pleural conditions: Secondary | ICD-10-CM

## 2021-05-25 DIAGNOSIS — K219 Gastro-esophageal reflux disease without esophagitis: Secondary | ICD-10-CM | POA: Diagnosis present

## 2021-05-25 DIAGNOSIS — I480 Paroxysmal atrial fibrillation: Secondary | ICD-10-CM | POA: Diagnosis not present

## 2021-05-25 DIAGNOSIS — J9811 Atelectasis: Secondary | ICD-10-CM | POA: Diagnosis not present

## 2021-05-25 DIAGNOSIS — Z7951 Long term (current) use of inhaled steroids: Secondary | ICD-10-CM | POA: Diagnosis not present

## 2021-05-25 DIAGNOSIS — J9611 Chronic respiratory failure with hypoxia: Secondary | ICD-10-CM | POA: Diagnosis not present

## 2021-05-25 DIAGNOSIS — I517 Cardiomegaly: Secondary | ICD-10-CM | POA: Diagnosis not present

## 2021-05-25 DIAGNOSIS — I82461 Acute embolism and thrombosis of right calf muscular vein: Secondary | ICD-10-CM | POA: Diagnosis not present

## 2021-05-25 DIAGNOSIS — S2231XA Fracture of one rib, right side, initial encounter for closed fracture: Secondary | ICD-10-CM | POA: Diagnosis not present

## 2021-05-25 DIAGNOSIS — Z6820 Body mass index (BMI) 20.0-20.9, adult: Secondary | ICD-10-CM

## 2021-05-25 DIAGNOSIS — N390 Urinary tract infection, site not specified: Secondary | ICD-10-CM | POA: Diagnosis not present

## 2021-05-25 DIAGNOSIS — Z66 Do not resuscitate: Secondary | ICD-10-CM | POA: Diagnosis not present

## 2021-05-25 DIAGNOSIS — G620 Drug-induced polyneuropathy: Secondary | ICD-10-CM | POA: Diagnosis not present

## 2021-05-25 DIAGNOSIS — Z4682 Encounter for fitting and adjustment of non-vascular catheter: Secondary | ICD-10-CM | POA: Diagnosis not present

## 2021-05-25 DIAGNOSIS — D6489 Other specified anemias: Secondary | ICD-10-CM | POA: Diagnosis not present

## 2021-05-25 LAB — BASIC METABOLIC PANEL
Anion gap: 7 (ref 5–15)
BUN: 19 mg/dL (ref 8–23)
CO2: 33 mmol/L — ABNORMAL HIGH (ref 22–32)
Calcium: 9.5 mg/dL (ref 8.9–10.3)
Chloride: 94 mmol/L — ABNORMAL LOW (ref 98–111)
Creatinine, Ser: 0.72 mg/dL (ref 0.44–1.00)
GFR, Estimated: >60 mL/min (ref >60)
Glucose, Bld: 111 mg/dL — ABNORMAL HIGH (ref 70–99)
Potassium: 4.6 mmol/L (ref 3.5–5.1)
Sodium: 134 mmol/L — ABNORMAL LOW (ref 135–145)

## 2021-05-25 LAB — CBC WITH DIFFERENTIAL/PLATELET
Abs Immature Granulocytes: 0.02 10*3/uL (ref 0.00–0.07)
Basophils Absolute: 0 10*3/uL (ref 0.0–0.1)
Basophils Relative: 0 %
Eosinophils Absolute: 0.2 10*3/uL (ref 0.0–0.5)
Eosinophils Relative: 2 %
HCT: 34.5 % — ABNORMAL LOW (ref 36.0–46.0)
Hemoglobin: 11.1 g/dL — ABNORMAL LOW (ref 12.0–15.0)
Immature Granulocytes: 0 %
Lymphocytes Relative: 6 %
Lymphs Abs: 0.5 10*3/uL — ABNORMAL LOW (ref 0.7–4.0)
MCH: 30.6 pg (ref 26.0–34.0)
MCHC: 32.2 g/dL (ref 30.0–36.0)
MCV: 95 fL (ref 80.0–100.0)
Monocytes Absolute: 0.6 10*3/uL (ref 0.1–1.0)
Monocytes Relative: 7 %
Neutro Abs: 7.5 10*3/uL (ref 1.7–7.7)
Neutrophils Relative %: 85 %
Platelets: 403 10*3/uL — ABNORMAL HIGH (ref 150–400)
RBC: 3.63 MIL/uL — ABNORMAL LOW (ref 3.87–5.11)
RDW: 13 % (ref 11.5–15.5)
WBC: 8.8 10*3/uL (ref 4.0–10.5)
nRBC: 0 % (ref 0.0–0.2)

## 2021-05-25 LAB — BRAIN NATRIURETIC PEPTIDE: B Natriuretic Peptide: 480.6 pg/mL — ABNORMAL HIGH (ref 0.0–100.0)

## 2021-05-25 LAB — HEPATIC FUNCTION PANEL
ALT: 14 U/L (ref 0–44)
AST: 26 U/L (ref 15–41)
Albumin: 3.2 g/dL — ABNORMAL LOW (ref 3.5–5.0)
Alkaline Phosphatase: 53 U/L (ref 38–126)
Bilirubin, Direct: 0.2 mg/dL (ref 0.0–0.2)
Indirect Bilirubin: 0.4 mg/dL (ref 0.3–0.9)
Total Bilirubin: 0.6 mg/dL (ref 0.3–1.2)
Total Protein: 6.5 g/dL (ref 6.5–8.1)

## 2021-05-25 LAB — LACTIC ACID, PLASMA: Lactic Acid, Venous: 0.8 mmol/L (ref 0.5–1.9)

## 2021-05-25 LAB — RESP PANEL BY RT-PCR (FLU A&B, COVID) ARPGX2
Influenza A by PCR: NEGATIVE
Influenza B by PCR: NEGATIVE
SARS Coronavirus 2 by RT PCR: NEGATIVE

## 2021-05-25 MED ORDER — LOSARTAN POTASSIUM 25 MG PO TABS
100.0000 mg | ORAL_TABLET | Freq: Every day | ORAL | Status: DC
  Administered 2021-05-26 – 2021-06-06 (×12): 100 mg via ORAL
  Filled 2021-05-25 (×12): qty 4

## 2021-05-25 MED ORDER — ENOXAPARIN SODIUM 30 MG/0.3ML IJ SOSY: 30.0000 mg | PREFILLED_SYRINGE | INTRAMUSCULAR | Status: DC

## 2021-05-25 MED ORDER — IOHEXOL 350 MG/ML SOLN
80.0000 mL | Freq: Once | INTRAVENOUS | Status: AC | PRN
  Administered 2021-05-25: 80 mL via INTRAVENOUS

## 2021-05-25 MED ORDER — OXYCODONE HCL 5 MG PO TABS
5.0000 mg | ORAL_TABLET | Freq: Four times a day (QID) | ORAL | Status: DC | PRN
  Administered 2021-05-25: 5 mg via ORAL
  Filled 2021-05-25: qty 1

## 2021-05-25 MED ORDER — METOPROLOL TARTRATE 25 MG PO TABS
50.0000 mg | ORAL_TABLET | Freq: Two times a day (BID) | ORAL | Status: DC
  Administered 2021-05-25 – 2021-06-08 (×27): 50 mg via ORAL
  Filled 2021-05-25 (×27): qty 2

## 2021-05-25 MED ORDER — DOCUSATE SODIUM 100 MG PO CAPS: 100.0000 mg | ORAL_CAPSULE | Freq: Two times a day (BID) | ORAL | Status: DC | PRN

## 2021-05-25 MED ORDER — DILTIAZEM HCL 30 MG PO TABS: 30.0000 mg | ORAL_TABLET | Freq: Four times a day (QID) | ORAL | Status: DC

## 2021-05-25 MED ORDER — DILTIAZEM HCL-DEXTROSE 125-5 MG/125ML-% IV SOLN (PREMIX)
5.0000 mg/h | INTRAVENOUS | Status: DC
  Administered 2021-05-25: 5 mg/h via INTRAVENOUS
  Filled 2021-05-25 (×2): qty 125

## 2021-05-25 MED ORDER — MORPHINE SULFATE (PF) 2 MG/ML IV SOLN
2.0000 mg | Freq: Once | INTRAVENOUS | Status: DC
  Filled 2021-05-25: qty 1

## 2021-05-25 MED ORDER — ONDANSETRON HCL 4 MG/2ML IJ SOLN
4.0000 mg | Freq: Once | INTRAMUSCULAR | Status: DC
  Filled 2021-05-25: qty 2

## 2021-05-25 MED ORDER — METOPROLOL TARTRATE 5 MG/5ML IV SOLN
5.0000 mg | Freq: Once | INTRAVENOUS | Status: AC
  Administered 2021-05-25: 5 mg via INTRAVENOUS
  Filled 2021-05-25: qty 5

## 2021-05-25 MED ORDER — HEPARIN (PORCINE) 25000 UT/250ML-% IV SOLN
1100.0000 [IU]/h | INTRAVENOUS | Status: DC
  Administered 2021-05-25: 1000 [IU]/h via INTRAVENOUS
  Filled 2021-05-25: qty 250

## 2021-05-25 MED ORDER — LORAZEPAM 0.5 MG PO TABS
0.5000 mg | ORAL_TABLET | Freq: Two times a day (BID) | ORAL | Status: DC | PRN
  Administered 2021-05-25 – 2021-05-31 (×9): 0.5 mg via ORAL
  Filled 2021-05-25 (×9): qty 1

## 2021-05-25 MED ORDER — ONDANSETRON HCL 4 MG PO TABS: 4.0000 mg | ORAL_TABLET | Freq: Four times a day (QID) | ORAL | Status: DC | PRN

## 2021-05-25 MED ORDER — DOCUSATE SODIUM 100 MG PO CAPS: 100.0000 mg | ORAL_CAPSULE | Freq: Two times a day (BID) | ORAL | Status: DC

## 2021-05-25 MED ORDER — PANTOPRAZOLE SODIUM 40 MG PO TBEC
40.0000 mg | DELAYED_RELEASE_TABLET | Freq: Every day | ORAL | Status: DC
  Administered 2021-05-26 – 2021-06-01 (×7): 40 mg via ORAL
  Filled 2021-05-25 (×7): qty 1

## 2021-05-25 MED ORDER — HEPARIN BOLUS VIA INFUSION
2000.0000 [IU] | Freq: Once | INTRAVENOUS | Status: AC
  Administered 2021-05-25: 2000 [IU] via INTRAVENOUS
  Filled 2021-05-25: qty 2000

## 2021-05-25 MED ORDER — ONDANSETRON HCL 4 MG/2ML IJ SOLN
4.0000 mg | Freq: Four times a day (QID) | INTRAMUSCULAR | Status: DC | PRN
  Administered 2021-06-03: 4 mg via INTRAVENOUS
  Filled 2021-05-25: qty 2

## 2021-05-25 MED ORDER — ACETAMINOPHEN 500 MG PO TABS
1000.0000 mg | ORAL_TABLET | Freq: Three times a day (TID) | ORAL | Status: DC
  Administered 2021-05-25 – 2021-06-08 (×36): 1000 mg via ORAL
  Filled 2021-05-25 (×42): qty 2

## 2021-05-25 MED ORDER — SODIUM CHLORIDE 0.9 % IV SOLN: INTRAVENOUS | Status: DC

## 2021-05-25 MED ORDER — SODIUM CHLORIDE (PF) 0.9 % IJ SOLN
INTRAMUSCULAR | Status: AC
  Filled 2021-05-25: qty 50

## 2021-05-25 NOTE — H&P (Signed)
History and Physical    AVAEH EWER YKZ:[id number] DOB: 13-Jul-1941 DOA: 05/25/2021  PCP: Martinique, Betty G, MD   Patient coming from:  HOME.  I have personally briefly reviewed patient's old medical records in [place]  Chief Complaint: Bilateral leg swelling.  HPI: Melanie [name] is a 80 y.o. female with medical history significant of COPD, multinodular goiter, essential hypertension, anxiety disorder, overactive bladder, A. fib with RVR, recent traumatic hemopneumothorax presented in the ED with bilateral leg swelling.   Patient was recently admitted to the [facility] trauma service from 05/11/21- 05/20/21 for right-sided rib fractures with hemopneumothorax following fall from a bed.  During hospitalization patient was intubated and then extubated, She had a right pigtail drain placed and removed and underwent IR thoracocentesis for right pleural effusion.  During that admission Patient has also developed A. fib with RVR which was evaluated by cardiology Dr. Einar Gip and placed on metoprolol and Cardizem with recommendation for outpatient follow-up.  She also required supplemental oxygen during that admission and was discharged home on 3 L of oxygen. Daughter in law reports intermittent shortness of breath with exertion, and progressive increase in leg swelling after she is discharged from hospital.  Her oxygen requirement has increased to 4 L today.  She denies any chest pain, palpitation, dizziness.  She denies any fever, chills, body ache, sick contact, recent travel.  ED Course: She was tachycardic,  tachypneic, hypertensive, afebrile. HR 128, RR 26, BP169/94,SPO2 97% on 4L/M Temp 98.7 F Labs include sodium 134, potassium 4.6, chloride 94, bicarb 33, glucose 111, BUN 19, creatinine 0.72, anion gap 7, calcium 9.5, albumin 3.2, AST 26, ALT 14, total protein 6.5, direct bilirubin 0.2, total bilirubin 0.6, BNP 480, lactic acid 0.8, WBC 8.8, hemoglobin 11.1, hematocrit 34.5, platelet 403,  influenza negative, COVID-negative,  CT chest: No definite evidence of pulmonary embolus. Moderate right hydropneumothorax is noted with loculated effusionseen in the right lower lobe and associated atelectasis of the right lower lobe. Stable appearance of right segmental rib fractures and flail chest is noted as described on prior exam.   Review of Systems:  Review of Systems  Constitutional: Positive for malaise/fatigue.  HENT: Negative.   Eyes: Negative.   Respiratory: Positive for shortness of breath.   Cardiovascular: Positive for leg swelling.  Gastrointestinal: Negative.   Genitourinary: Negative.   Musculoskeletal: Positive for back pain and joint pain.  Skin: Negative.   Neurological: Positive for dizziness.  Endo/Heme/Allergies: Negative.   Psychiatric/Behavioral: Negative.     Past Medical History:  Diagnosis Date  . Anxiety   . 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 COPD (chronic obstructive pulmonary disease) ([place])   . Thyroid disease     Past Surgical History:  Procedure Laterality Date  . ABDOMINAL HYSTERECTOMY    . COLON SURGERY    . IR THORACENTESIS ASP PLEURAL SPACE W/IMG GUIDE  05/17/2021     reports that she has quit smoking. Her smoking use included cigarettes. She has never used smokeless tobacco. She reports that she does not drink alcohol and does not use drugs.  Allergies  Allergen Reactions  . Latex Rash  . Macrodantin Shortness Of Breath  . Guaifenesin Er Itching  . Morphine And Related Nausea Only  . Ditropan [Oxybutynin]     Causes diarrhea - contains Magnesium  . Lipitor [Atorvastatin]     rash  . Lisinopril     Diarrhea/cough  . Magnesium-Containing Compounds     Causes diarrhea  .  Pravastatin     Leg cramps  . Zoloft [Sertraline Hcl]     Diarrhea    Family History  Problem Relation Age of Onset  . Cancer Sister        Lung cancer    Family history reviewed and not pertinent   Prior to Admission medications   Medication Sig  Start Date End Date Taking? Authorizing Provider  acetaminophen (TYLENOL) 500 MG tablet Take 2 tablets (1,000 mg total) by mouth every 8 (eight) hours. 05/20/21   Maczis, Barth Kirks, PA-C  acetaminophen (TYLENOL) 650 MG CR tablet Take 650 mg by mouth every 8 (eight) hours as needed.    [provider]  Calcium Carbonate (CALCIUM 600 PO) Take 1 tablet by mouth daily.    [provider]  CALCIUM PO Take 1 tablet by mouth daily.    [provider]  cetirizine (ZYRTEC) 10 MG tablet Take 0.5 tablets (5 mg total) by mouth daily. Patient taking differently: Take 10 mg by mouth daily. 09/09/20   Martinique, Betty G, MD  diltiazem (CARDIZEM) 30 MG tablet Take 1 tablet (30 mg total) by mouth every 6 (six) hours. 05/20/21   Maczis, Barth Kirks, PA-C  docusate sodium (COLACE) 100 MG capsule Take 1 capsule (100 mg total) by mouth 2 (two) times daily as needed for mild constipation. 05/20/21   Maczis, Barth Kirks, PA-C  Ferrous Sulfate (IRON PO) Take 1 tablet by mouth daily.    [provider]  fexofenadine (ALLEGRA) 180 MG tablet Take 180 mg by mouth daily.    [provider]  fluticasone (FLONASE) 50 MCG/ACT nasal spray Place 1 spray into both nostrils 2 (two) times daily. 11/05/19   Martinique, Betty G, MD  ipratropium (ATROVENT) 0.06 % nasal spray Place 2 sprays into both nostrils 4 (four) times daily. 09/09/20   Martinique, Betty G, MD  loperamide (IMODIUM) 2 MG capsule Take 2 mg by mouth as needed.    [provider]  LORazepam (ATIVAN) 0.5 MG tablet Take 0.5-1 tablets (0.25-0.5 mg total) by mouth 2 (two) times daily as needed. for anxiety 03/30/21   Martinique, Betty G, MD  LORazepam (ATIVAN) 0.5 MG tablet Take 0.5 mg by mouth 2 (two) times daily as needed for anxiety. 04/30/21   [provider]  losartan (COZAAR) 100 MG tablet TAKE 1 TABLET BY MOUTH EVERY DAY 03/22/21   Martinique, Betty G, MD  metoprolol tartrate (LOPRESSOR) 50 MG tablet Take 1 tablet (50 mg total) by mouth 2  (two) times daily. 05/20/21   Maczis, Barth Kirks, PA-C  oxyCODONE (OXY IR/ROXICODONE) 5 MG immediate release tablet Take 1 tablet (5 mg total) by mouth every 6 (six) hours as needed for breakthrough pain. 05/20/21   Ileana Roup, MD  pantoprazole (PROTONIX) 20 MG tablet TAKE 1 TABLET BY MOUTH 30 MINUTES BEFORE BREAKFAST. STOP OMEPRAZOLE 05/10/21   Martinique, Betty G, MD  pantoprazole (PROTONIX) 20 MG tablet Take 20 mg by mouth daily. 05/10/21   [provider]  polyethylene glycol (MIRALAX / GLYCOLAX) 17 g packet Take 17 g by mouth daily as needed. 05/20/21   Maczis, Barth Kirks, PA-C  Red Yeast Rice 600 MG CAPS Take 600 mg by mouth 2 (two) times daily.    [provider]  Red Yeast Rice Extract (RED YEAST RICE PO) Take 1 tablet by mouth daily.    [provider]  VITAMIN D PO Take 1 capsule by mouth daily.    [provider]  Physical Exam: Vitals:   05/25/21 1545 05/25/21 1600 05/25/21 1615 05/25/21 1630  BP:    (!) 140/115  Pulse: 97 (!) 122  (!) 118  Resp: (!) 53 (!) 38 (!) 26 (!) 26  Temp:      TempSrc:      SpO2: 100% 90%  97%    Constitutional: NAD, calm, comfortable Vitals:   05/25/21 1545 05/25/21 1600 05/25/21 1615 05/25/21 1630  BP:    (!) 140/115  Pulse: 97 (!) 122  (!) 118  Resp: (!) 53 (!) 38 (!) 26 (!) 26  Temp:      TempSrc:      SpO2: 100% 90%  97%   Eyes: PERRL, lids and conjunctivae normal, does not appear in respiratory distress. ENMT: Mucous membranes are moist. Posterior pharynx clear of any exudate or lesions.Normal dentition.  Neck: normal, supple, no masses, no thyromegaly Respiratory: clear to auscultation bilaterally, no wheezing, no crackles. Normal respiratory effort. No accessory muscle use.  Cardiovascular: Iregular rate and rhythm, murmurs+ / rubs- / gallops-.  2+ pitting edema.. 2+ pedal pulses. No carotid bruits.  Abdomen: no tenderness, no masses palpated. No hepatosplenomegaly. Bowel sounds positive.   Musculoskeletal: no clubbing / cyanosis. No joint deformity upper and lower extremities. Good ROM, no contractures. Normal muscle tone.  Skin: no rashes, lesions, ulcers. No induration Neurologic: CN 2-12 grossly intact. Sensation intact, DTR normal. Strength 5/5 in all 4.  Psychiatric: Normal judgment and insight. Alert and oriented x 3. Normal mood.    Labs on Admission: I have personally reviewed following labs and imaging studies  CBC: Recent Labs  Lab 05/25/21 1356  WBC 8.8  NEUTROABS 7.5  HGB 11.1*  HCT 34.5*  MCV 95.0  PLT 929*   Basic Metabolic Panel: Recent Labs  Lab 05/25/21 1356  NA 134*  K 4.6  CL 94*  CO2 33*  GLUCOSE 111*  BUN 19  CREATININE 0.72  CALCIUM 9.5   GFR: CrCl cannot be calculated (Unknown ideal weight.). Liver Function Tests: Recent Labs  Lab 05/25/21 1358  AST 26  ALT 14  ALKPHOS 53  BILITOT 0.6  PROT 6.5  ALBUMIN 3.2*   No results for input(s): LIPASE, AMYLASE in the last 168 hours. No results for input(s): AMMONIA in the last 168 hours. Coagulation Profile: No results for input(s): INR, PROTIME in the last 168 hours. Cardiac Enzymes: No results for input(s): CKTOTAL, CKMB, CKMBINDEX, TROPONINI in the last 168 hours. BNP (last 3 results) Recent Labs    03/30/21 1301  PROBNP 97.0   HbA1C: No results for input(s): HGBA1C in the last 72 hours. CBG: No results for input(s): GLUCAP in the last 168 hours. Lipid Profile: No results for input(s): CHOL, HDL, LDLCALC, TRIG, CHOLHDL, LDLDIRECT in the last 72 hours. Thyroid Function Tests: No results for input(s): TSH, T4TOTAL, FREET4, T3FREE, THYROIDAB in the last 72 hours. Anemia Panel: No results for input(s): VITAMINB12, FOLATE, FERRITIN, TIBC, IRON, RETICCTPCT in the last 72 hours. Urine analysis:    Component Value Date/Time   COLORURINE YELLOW 05/11/2021 0526   APPEARANCEUR CLEAR 05/11/2021 0526   LABSPEC 1.038 (H) 05/11/2021 0526   LABSPEC 1.005 06/01/2009 1353    PHURINE 5.0 05/11/2021 0526   GLUCOSEU NEGATIVE 05/11/2021 0526   GLUCOSEU NEGATIVE 01/16/2018 1127   HGBUR NEGATIVE 05/11/2021 0526   BILIRUBINUR NEGATIVE 05/11/2021 0526   BILIRUBINUR Negative 06/01/2009 1353   KETONESUR NEGATIVE 05/11/2021 0526   PROTEINUR NEGATIVE 05/11/2021 0526   UROBILINOGEN 0.2 01/16/2018 1127  NITRITE NEGATIVE 05/11/2021 0526   LEUKOCYTESUR NEGATIVE 05/11/2021 0526   LEUKOCYTESUR Small 06/01/2009 1353    Radiological Exams on Admission: DG Chest 2 View  Addendum Date: 05/25/2021   ADDENDUM REPORT: 05/25/2021 13:45 ADDENDUM: Critical Value/emergent results were called by telephone at the time of interpretation on 05/25/2021 at 1:45 pm to provider Silverio Decamp, PA, who verbally acknowledged these results. Electronically Signed   By: Lowella Grip III M.D.   On: 05/25/2021 13:45   Result Date: 05/25/2021 CLINICAL DATA:  Shortness of breath EXAM: CHEST - 2 VIEW COMPARISON:  May 20, 2021. FINDINGS: There is a fairly small right apical pneumothorax without tension component. There are multiple displaced rib fractures on the right. There is a moderate pleural effusion on the right with atelectasis and potential consolidation in portions of the right middle and lower lobes. Left lung clear. Heart size and pulmonary vascularity are normal. No adenopathy. There is degenerative change in the thoracic spine. IMPRESSION: Multiple displaced rib fractures on the right with right pleural effusion and fairly small right apical pneumothorax. No tension component. Atelectasis with potential consolidation in portions of the right middle and lower lobes. Left lung clear. Heart size normal. Aortic Atherosclerosis (ICD10-I70.0). Electronically Signed: By: Lowella Grip III M.D. On: 05/25/2021 13:31   CT Angio Chest PE W/Cm &/Or Wo Cm  Result Date: 05/25/2021 CLINICAL DATA:  Shortness of breath. EXAM: CT ANGIOGRAPHY CHEST WITH CONTRAST TECHNIQUE: Multidetector CT imaging of the  chest was performed using the standard protocol during bolus administration of intravenous contrast. Multiplanar CT image reconstructions and MIPs were obtained to evaluate the vascular anatomy. CONTRAST:  35mL OMNIPAQUE IOHEXOL 350 MG/ML SOLN COMPARISON:  May 11, 2021. FINDINGS: Cardiovascular: Satisfactory opacification of the pulmonary arteries to the segmental level. No evidence of pulmonary embolism. Normal heart size. No pericardial effusion. Atherosclerosis of thoracic aorta is noted without aneurysm or dissection. Mediastinum/Nodes: Stable findings consistent with multinodular goiter. No adenopathy is noted. Esophagus is unremarkable. Lungs/Pleura: Left lung is clear. Moderate right hydropneumothorax is noted with loculated effusion seen in the right lower lobe and associated atelectasis of the right lower lobe. Upper Abdomen: No acute abnormality. Musculoskeletal: Stable appearance of right segmental rib fractures and flail chest as noted on prior exam. Review of the MIP images confirms the above findings. IMPRESSION: No definite evidence of pulmonary embolus. Moderate right hydropneumothorax is noted with loculated effusion seen in the right lower lobe and associated atelectasis of the right lower lobe. Stable appearance of right segmental rib fractures and flail chest is noted as described on prior exam. Aortic Atherosclerosis (ICD10-I70.0). Electronically Signed   By: Marijo Conception M.D.   On: 05/25/2021 16:38   VAS [place] LOWER EXTREMITY VENOUS (DVT) (ONLY [facility] & [facility] 7a-7p)  Result Date: 05/25/2021  Lower Venous DVT Study Patient Name:  Melanie [name]  Date of Exam:   05/25/2021 Medical Rec #: [id number]          Accession #:    [id number] Date of Birth: [birth year]           Patient Gender: F Patient Age:   79Y Exam Location:  [facility] Procedure:      VAS [place] LOWER EXTREMITY VENOUS (DVT) Referring Phys: [id number] [place]  --------------------------------------------------------------------------------  Indications: Swelling.  Risk Factors: None identified. Comparison Study: No prior studies. Performing Technologist: Oliver Hum RVT  Examination Guidelines: A complete evaluation includes B-mode imaging, spectral Doppler, color Doppler, and power Doppler as needed of all accessible  portions of each vessel. Bilateral testing is considered an integral part of a complete examination. Limited examinations for reoccurring indications may be performed as noted. The reflux portion of the exam is performed with the patient in reverse Trendelenburg.  +---------+---------------+---------+-----------+----------+--------------+ RIGHT    CompressibilityPhasicitySpontaneityPropertiesThrombus Aging +---------+---------------+---------+-----------+----------+--------------+ CFV      Full           Yes      Yes                                 +---------+---------------+---------+-----------+----------+--------------+ SFJ      Full                                                        +---------+---------------+---------+-----------+----------+--------------+ FV Prox  Full                                                        +---------+---------------+---------+-----------+----------+--------------+ FV Mid   Full                                                        +---------+---------------+---------+-----------+----------+--------------+ FV DistalFull                                                        +---------+---------------+---------+-----------+----------+--------------+ PFV      Full                                                        +---------+---------------+---------+-----------+----------+--------------+ POP      Full           Yes      Yes                                 +---------+---------------+---------+-----------+----------+--------------+ PTV      Full                                                         +---------+---------------+---------+-----------+----------+--------------+ PERO     Full                                                        +---------+---------------+---------+-----------+----------+--------------+ Gastroc  Partial  Acute          +---------+---------------+---------+-----------+----------+--------------+   +---------+---------------+---------+-----------+----------+--------------+ LEFT     CompressibilityPhasicitySpontaneityPropertiesThrombus Aging +---------+---------------+---------+-----------+----------+--------------+ CFV      Full           Yes      Yes                                 +---------+---------------+---------+-----------+----------+--------------+ SFJ      Full                                                        +---------+---------------+---------+-----------+----------+--------------+ FV Prox  Full                                                        +---------+---------------+---------+-----------+----------+--------------+ FV Mid   Full                                                        +---------+---------------+---------+-----------+----------+--------------+ FV DistalFull                                                        +---------+---------------+---------+-----------+----------+--------------+ PFV      Full                                                        +---------+---------------+---------+-----------+----------+--------------+ POP      Full           Yes      Yes                                 +---------+---------------+---------+-----------+----------+--------------+ PTV      Full                                                        +---------+---------------+---------+-----------+----------+--------------+ PERO     Full                                                         +---------+---------------+---------+-----------+----------+--------------+    Summary: RIGHT: - Findings consistent with acute deep vein thrombosis involving the right gastrocnemius veins. - No cystic structure found in the popliteal fossa.  LEFT: - There is no  evidence of deep vein thrombosis in the lower extremity.  - No cystic structure found in the popliteal fossa.  *See table(s) above for measurements and observations.    Preliminary     EKG: Independently reviewed.  Atrial fibrillation with RVR  Assessment/Plan Active Problems:   Atrial fibrillation with RVR (HCC)    Atrial fibrillation with RVR: Patient presented with bilateral leg swelling, on prior admission she was found to have A. fib with RVR. She was discharged home on Cardizem 30 every 6 and metoprolol 50 mg twice daily. She was not discharged on anticoagulation, no documented reason. She denies any chest pain, shortness of breath. Cardiology consulted, patient started on Cardizem gtt. Continue metoprolol 50 mg every 12 hours. Continue to monitor heart rate. Last echocardiogram 5/24: Grossly preserved LVEF.  Right leg DVT:  Start heparin gtt. we will monitor APTT. Heparin need to be stopped 6 hours prior to IR thoracocentesis.  Right hydropneumothorax with loculated effusion. Patient was seen by general surgery, recommended admission at [facility]. IR consult for thoracocentesis. Adequate pain control. Spoke with Dr. Bobbye Morton, recommended medical admission. Trauma team will continue to consult.  Anxiety: Continue anxiety medications lorazepam.  Hypertension: continue losartan and metoprolol  Chronic hypoxic respiratory failure secondary to COPD. Continue supplemental oxygen to keep saturation above 94%. Continue home inhalers.  GERD: Continue pantoprazole    DVT prophylaxis: Heparin gtt Code Status: Full code Family Communication: Spoke with daughter-in-law at bedside Disposition Plan:    Status is:  Inpatient  Remains inpatient appropriate because:Inpatient level of care appropriate due to severity of illness   Dispo: The patient is from: Home              Anticipated d/c is to: Home              Patient currently is not medically stable to d/c.   Difficult to place patient No  Consults called: Cardiology Dr. Virgina Jock  Trauma MD :Dr. Jamas Lav Admission status: Inpatient   Shawna Clamp MD [facility]   If 7PM-7AM, please contact night-coverage [email or web address]   05/25/2021, 5:24 PM

## 2021-05-25 NOTE — ED Triage Notes (Signed)
Pt presents with bilateral foot swelling xs 3 days. States was recently admitted to hospital for fall and punctured lung.

## 2021-05-25 NOTE — ED Notes (Addendum)
PA made aware of elevated HR. HR 130-140s.

## 2021-05-25 NOTE — ED Triage Notes (Signed)
Pt went to urgent care d/t leg swelling. They sent her here d/t crackles in lungs. Pt was recently hospitalized for fractured ribs and punctured lung. She is on 4L [place] O2 since discharged home.

## 2021-05-25 NOTE — ED Provider Notes (Signed)
Emergency Medicine Provider Triage Evaluation Note  Melanie [name] , a 80 y.o. female  was evaluated in triage.  Pt complains of leg swelling and shortness of breath.  Recently hospitalized for fractured ribs and punctured lung.  Sent home with supplemental oxygen which is typically between 3 and 4 L at home.  We will ambulate with a walker at times to the restroom.  Legs have been progressively worsening swelling.  No complaints of chest pain or abdominal pain.  Review of Systems  Positive: Leg swelling, shortness of breath Negative: Chest pain, abdominal pain  Physical Exam  BP (!) 147/68 (BP Location: Right Arm)   Pulse 66   Temp 98.7 F (37.1 C) (Oral)   Resp (!) 22   SpO2 (!) 87% Comment: Pt on 1 liter of O2 Gen:   Awake, no distress   Resp:  Normal effort  MSK:   Moves extremities without difficulty  Other:  Pitting edema noted to bilateral lower extremities with faint pulses noted bilaterally.  Medical Decision Making  Medically screening exam initiated at 12:23 PM.  Appropriate orders placed.  Tiffancy Moger Waid was informed that the remainder of the evaluation will be completed by another provider, this initial triage assessment does not replace that evaluation, and the importance of remaining in the ED until their evaluation is complete.  Lab work initiated   Delia Heady, PA-C 05/25/21 [place]    Carmin Muskrat, MD 05/25/21 [phone]

## 2021-05-25 NOTE — ED Notes (Signed)
Pt has very cold hands, difficult to get SPo2 waveform. spo2 placed on forehead, 100 percent noted on 4L of [place] which pt uses at home.

## 2021-05-25 NOTE — ED Notes (Signed)
Patient is being discharged from the Urgent Care and sent to the Emergency Department via POV with family . Per Olene Craven, PA, patient is in need of higher level of care due to hypotension and feet swelling. Patient is aware and verbalizes understanding of plan of care.  Vitals:   05/25/21 1058  BP: (!) 125/44  Pulse: (!) 101  Resp: 16  SpO2: 100%

## 2021-05-25 NOTE — ED Provider Notes (Signed)
Melanie [name] Provider Note   CSN: [id number] Arrival date & time: 05/25/21  1145     History Chief Complaint  Patient presents with  . Leg Swelling    Melanie [name] is a 80 y.o. female who presents today for shortness of breath and bilateral lower extremity edema x3 days.  Patient was recently admitted to [facility] for several days following multiple displaced right rib fractures with resultant hemopneumothorax.  Status postintubation with chest tube.  Was discharged home on 4 L of supplemental oxygen as needed by nasal cannula.  Patient presented to the urgent care today for evaluation of bilateral lower extremity edema; was found to have rales in the lung bases as well and was directed to the emergency department.  Patient does endorse pain in her ribs, states she has not been using the OxyContin at home due to it making her feel "loopy".  Of note patient did develop A. fib with RVR during her most recent admission.  Was discharged with Cardizem and metoprolol.  Does not have RVR at this time.  I personally reviewed this patient's medical records.  She has history of rectal cancer, thyroid disease, COPD.  She is not anticoagulated.  HPI     Past Medical History:  Diagnosis Date  . Anxiety   . Cancer ([place])    rectal ca  . COPD (chronic obstructive pulmonary disease) ([place])   . Thyroid disease     Patient Active Problem List   Diagnosis Date Noted  . Closed fracture of multiple ribs with flail chest 05/11/2021  . Atherosclerosis of aorta ([place]) 03/30/2021  . Heart murmur 03/30/2021  . SOB (shortness of breath) 03/30/2021  . COPD (chronic obstructive pulmonary disease) ([place]) 09/09/2020  . Adjustment disorder, unspecified 01/11/2019  . Allergic rhinitis 06/18/2018  . Chronic fatigue 01/16/2018  . 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 Anxiety disorder 08/03/2016  . Lumbar back pain with radiculopathy  affecting right lower extremity 08/03/2016  . Primary hypertension 04/14/2016  . Hypercholesteremia 04/14/2016  . Multinodular goiter 04/14/2016  . Overactive bladder 04/14/2016  . GERD (gastroesophageal reflux disease) 04/14/2016    Past Surgical History:  Procedure Laterality Date  . ABDOMINAL HYSTERECTOMY    . COLON SURGERY    . IR THORACENTESIS ASP PLEURAL SPACE W/IMG GUIDE  05/17/2021     OB History   No obstetric history on file.     Family History  Problem Relation Age of Onset  . Cancer Sister        Lung cancer    Social History   Tobacco Use  . Smoking status: Former Smoker    Types: Cigarettes  . Smokeless tobacco: Never Used  Substance Use Topics  . Alcohol use: No  . Drug use: No    Home Medications Prior to Admission medications   Medication Sig Start Date End Date Taking? Authorizing Provider  acetaminophen (TYLENOL) 500 MG tablet Take 2 tablets (1,000 mg total) by mouth every 8 (eight) hours. 05/20/21   Maczis, Barth Kirks, PA-C  acetaminophen (TYLENOL) 650 MG CR tablet Take 650 mg by mouth every 8 (eight) hours as needed.    [provider]  Calcium Carbonate (CALCIUM 600 PO) Take 1 tablet by mouth daily.    [provider]  CALCIUM PO Take 1 tablet by mouth daily.    [provider]  cetirizine (ZYRTEC) 10 MG tablet Take 0.5 tablets (5 mg total) by mouth daily. Patient taking  differently: Take 10 mg by mouth daily. 09/09/20   [place], Betty G, MD  diltiazem (CARDIZEM) 30 MG tablet Take 1 tablet (30 mg total) by mouth every 6 (six) hours. 05/20/21   Maczis, Barth Kirks, PA-C  docusate sodium (COLACE) 100 MG capsule Take 1 capsule (100 mg total) by mouth 2 (two) times daily as needed for mild constipation. 05/20/21   Maczis, Barth Kirks, PA-C  Ferrous Sulfate (IRON PO) Take 1 tablet by mouth daily.    [provider]  fexofenadine (ALLEGRA) 180 MG tablet Take 180 mg by mouth daily.    [provider]  fluticasone  (FLONASE) 50 MCG/ACT nasal spray Place 1 spray into both nostrils 2 (two) times daily. 11/05/19   [place], Betty G, MD  ipratropium (ATROVENT) 0.06 % nasal spray Place 2 sprays into both nostrils 4 (four) times daily. 09/09/20   [place], Betty G, MD  loperamide (IMODIUM) 2 MG capsule Take 2 mg by mouth as needed.    [provider]  LORazepam (ATIVAN) 0.5 MG tablet Take 0.5-1 tablets (0.25-0.5 mg total) by mouth 2 (two) times daily as needed. for anxiety 03/30/21   [place], Betty G, MD  LORazepam (ATIVAN) 0.5 MG tablet Take 0.5 mg by mouth 2 (two) times daily as needed for anxiety. 04/30/21   [provider]  losartan (COZAAR) 100 MG tablet TAKE 1 TABLET BY MOUTH EVERY DAY 03/22/21   [place], Betty G, MD  metoprolol tartrate (LOPRESSOR) 50 MG tablet Take 1 tablet (50 mg total) by mouth 2 (two) times daily. 05/20/21   Maczis, Barth Kirks, PA-C  oxyCODONE (OXY IR/ROXICODONE) 5 MG immediate release tablet Take 1 tablet (5 mg total) by mouth every 6 (six) hours as needed for breakthrough pain. 05/20/21   Ileana Roup, MD  pantoprazole (PROTONIX) 20 MG tablet TAKE 1 TABLET BY MOUTH 30 MINUTES BEFORE BREAKFAST. STOP OMEPRAZOLE 05/10/21   [place], Betty G, MD  pantoprazole (PROTONIX) 20 MG tablet Take 20 mg by mouth daily. 05/10/21   [provider]  polyethylene glycol (MIRALAX / GLYCOLAX) 17 g packet Take 17 g by mouth daily as needed. 05/20/21   Maczis, Barth Kirks, PA-C  Red Yeast Rice 600 MG CAPS Take 600 mg by mouth 2 (two) times daily.    [provider]  Red Yeast Rice Extract (RED YEAST RICE PO) Take 1 tablet by mouth daily.    [provider]  VITAMIN D PO Take 1 capsule by mouth daily.    [provider]    Allergies    Latex, Macrodantin, Guaifenesin er, Morphine and related, Ditropan [oxybutynin], Lipitor [atorvastatin], Lisinopril, Magnesium-containing compounds, Pravastatin, and Zoloft [sertraline hcl]  Review of Systems   Review of Systems   Constitutional: Positive for fatigue. Negative for activity change, appetite change, chills, diaphoresis, fever and unexpected weight change.  HENT: Negative.   Respiratory: Positive for cough and shortness of breath.   Cardiovascular: Positive for palpitations. Negative for chest pain and leg swelling.       Right chest wall pain, known rib fractures  Gastrointestinal: Negative.   Musculoskeletal: Negative.   Neurological: Negative.     Physical Exam Updated Vital Signs BP (!) 140/115   Pulse (!) 118   Temp 98.7 F (37.1 C) (Oral)   Resp (!) 26   SpO2 97%   Physical Exam Vitals and nursing note reviewed.  Constitutional:      Appearance: She is cachectic. She is ill-appearing. She is not toxic-appearing.  HENT:  Head: Normocephalic and atraumatic.     Nose: Nose normal.     Mouth/Throat:     Mouth: Mucous membranes are moist.     Pharynx: Oropharynx is clear. Uvula midline. No oropharyngeal exudate, posterior oropharyngeal erythema or uvula swelling.     Tonsils: No tonsillar exudate.  Eyes:     General: Lids are normal. Vision grossly intact.        Right eye: No discharge.        Left eye: No discharge.     Extraocular Movements: Extraocular movements intact.     Conjunctiva/sclera: Conjunctivae normal.     Pupils: Pupils are equal, round, and reactive to light.  Neck:     Trachea: Trachea and phonation normal.  Cardiovascular:     Rate and Rhythm: Normal rate and regular rhythm.     Pulses: Normal pulses.     Heart sounds: Normal heart sounds. No murmur heard.   Pulmonary:     Effort: Tachypnea and accessory muscle usage present. No respiratory distress.     Breath sounds: Examination of the right-middle field reveals rales. Examination of the right-lower field reveals rales. Rales present. No wheezing.  Chest:     Chest wall: Tenderness present. No mass, lacerations, deformity, swelling or crepitus.    Abdominal:     General: Bowel sounds are normal.  There is no distension.     Palpations: Abdomen is soft.     Tenderness: There is no abdominal tenderness. There is no right CVA tenderness or left CVA tenderness.  Musculoskeletal:        General: No deformity.     Cervical back: Normal range of motion and neck supple.     Right lower leg: 2+ Edema present.     Left lower leg: 2+ Edema present.  Lymphadenopathy:     Cervical: No cervical adenopathy.  Skin:    General: Skin is warm and dry.  Neurological:     Mental Status: She is alert. Mental status is at baseline.  Psychiatric:        Mood and Affect: Mood normal.     ED Results / Procedures / Treatments   Labs (all labs ordered are listed, but only abnormal results are displayed) Labs Reviewed  BASIC METABOLIC PANEL - Abnormal; Notable for the following components:      Result Value   Sodium 134 (*)    Chloride 94 (*)    CO2 33 (*)    Glucose, Bld 111 (*)    All other components within normal limits  CBC WITH DIFFERENTIAL/PLATELET - Abnormal; Notable for the following components:   RBC 3.63 (*)    Hemoglobin 11.1 (*)    HCT 34.5 (*)    Platelets 403 (*)    Lymphs Abs 0.5 (*)    All other components within normal limits  BRAIN NATRIURETIC PEPTIDE - Abnormal; Notable for the following components:   B Natriuretic Peptide 480.6 (*)    All other components within normal limits  HEPATIC FUNCTION PANEL - Abnormal; Notable for the following components:   Albumin 3.2 (*)    All other components within normal limits  RESP PANEL BY RT-PCR (FLU A&B, COVID) [id number]  LACTIC ACID, PLASMA    EKG EKG Interpretation  Date/Time:  Tuesday May 25 2021 12:31:27 EDT Ventricular Rate:  98 PR Interval:    QRS Duration: 77 QT Interval:  308 QTC Calculation: 394 R Axis:   238 Text Interpretation: Atrial fibrillation Markedly posterior QRS axis Confirmed  by Lacretia Leigh [phone]) on 05/25/2021 1:47:27 PM   Radiology DG Chest 2 View  Addendum Date: 05/25/2021   ADDENDUM REPORT:  05/25/2021 13:45 ADDENDUM: Critical Value/emergent results were called by telephone at the time of interpretation on 05/25/2021 at 1:45 pm to provider Silverio Decamp, PA, who verbally acknowledged these results. Electronically Signed   By: Lowella Grip III M.D.   On: 05/25/2021 13:45   Result Date: 05/25/2021 CLINICAL DATA:  Shortness of breath EXAM: CHEST - 2 VIEW COMPARISON:  May 20, 2021. FINDINGS: There is a fairly small right apical pneumothorax without tension component. There are multiple displaced rib fractures on the right. There is a moderate pleural effusion on the right with atelectasis and potential consolidation in portions of the right middle and lower lobes. Left lung clear. Heart size and pulmonary vascularity are normal. No adenopathy. There is degenerative change in the thoracic spine. IMPRESSION: Multiple displaced rib fractures on the right with right pleural effusion and fairly small right apical pneumothorax. No tension component. Atelectasis with potential consolidation in portions of the right middle and lower lobes. Left lung clear. Heart size normal. Aortic Atherosclerosis (ICD10-I70.0). Electronically Signed: By: Lowella Grip III M.D. On: 05/25/2021 13:31   CT Angio Chest PE W/Cm &/Or Wo Cm  Result Date: 05/25/2021 CLINICAL DATA:  Shortness of breath. EXAM: CT ANGIOGRAPHY CHEST WITH CONTRAST TECHNIQUE: Multidetector CT imaging of the chest was performed using the standard protocol during bolus administration of intravenous contrast. Multiplanar CT image reconstructions and MIPs were obtained to evaluate the vascular anatomy. CONTRAST:  54mL OMNIPAQUE IOHEXOL 350 MG/ML SOLN COMPARISON:  May 11, 2021. FINDINGS: Cardiovascular: Satisfactory opacification of the pulmonary arteries to the segmental level. No evidence of pulmonary embolism. Normal heart size. No pericardial effusion. Atherosclerosis of thoracic aorta is noted without aneurysm or dissection. Mediastinum/Nodes:  Stable findings consistent with multinodular goiter. No adenopathy is noted. Esophagus is unremarkable. Lungs/Pleura: Left lung is clear. Moderate right hydropneumothorax is noted with loculated effusion seen in the right lower lobe and associated atelectasis of the right lower lobe. Upper Abdomen: No acute abnormality. Musculoskeletal: Stable appearance of right segmental rib fractures and flail chest as noted on prior exam. Review of the MIP images confirms the above findings. IMPRESSION: No definite evidence of pulmonary embolus. Moderate right hydropneumothorax is noted with loculated effusion seen in the right lower lobe and associated atelectasis of the right lower lobe. Stable appearance of right segmental rib fractures and flail chest is noted as described on prior exam. Aortic Atherosclerosis (ICD10-I70.0). Electronically Signed   By: Marijo Conception M.D.   On: 05/25/2021 16:38   VAS [place] LOWER EXTREMITY VENOUS (DVT) (ONLY [facility] & [facility] 7a-7p)  Result Date: 05/25/2021  Lower Venous DVT Study Patient Name:  MICHELLA DETJEN Ratchford  Date of Exam:   05/25/2021 Medical Rec #: [id number]          Accession #:    [id number] Date of Birth: 01/21/1941           Patient Gender: F Patient Age:   79Y Exam Location:  [facility] Procedure:      VAS [place] LOWER EXTREMITY VENOUS (DVT) Referring Phys: [id number] [place] --------------------------------------------------------------------------------  Indications: Swelling.  Risk Factors: None identified. Comparison Study: No prior studies. Performing Technologist: Oliver Hum RVT  Examination Guidelines: A complete evaluation includes B-mode imaging, spectral Doppler, color Doppler, and power Doppler as needed of all accessible portions of each vessel. Bilateral testing is considered an integral part of  a complete examination. Limited examinations for reoccurring indications may be performed as noted. The reflux portion of the exam is performed with the patient in  reverse Trendelenburg.  +---------+---------------+---------+-----------+----------+--------------+ RIGHT    CompressibilityPhasicitySpontaneityPropertiesThrombus Aging +---------+---------------+---------+-----------+----------+--------------+ CFV      Full           Yes      Yes                                 +---------+---------------+---------+-----------+----------+--------------+ SFJ      Full                                                        +---------+---------------+---------+-----------+----------+--------------+ FV Prox  Full                                                        +---------+---------------+---------+-----------+----------+--------------+ FV Mid   Full                                                        +---------+---------------+---------+-----------+----------+--------------+ FV DistalFull                                                        +---------+---------------+---------+-----------+----------+--------------+ PFV      Full                                                        +---------+---------------+---------+-----------+----------+--------------+ POP      Full           Yes      Yes                                 +---------+---------------+---------+-----------+----------+--------------+ PTV      Full                                                        +---------+---------------+---------+-----------+----------+--------------+ PERO     Full                                                        +---------+---------------+---------+-----------+----------+--------------+ Gastroc  Partial  Acute          +---------+---------------+---------+-----------+----------+--------------+   +---------+---------------+---------+-----------+----------+--------------+ LEFT     CompressibilityPhasicitySpontaneityPropertiesThrombus Aging  +---------+---------------+---------+-----------+----------+--------------+ CFV      Full           Yes      Yes                                 +---------+---------------+---------+-----------+----------+--------------+ SFJ      Full                                                        +---------+---------------+---------+-----------+----------+--------------+ FV Prox  Full                                                        +---------+---------------+---------+-----------+----------+--------------+ FV Mid   Full                                                        +---------+---------------+---------+-----------+----------+--------------+ FV DistalFull                                                        +---------+---------------+---------+-----------+----------+--------------+ PFV      Full                                                        +---------+---------------+---------+-----------+----------+--------------+ POP      Full           Yes      Yes                                 +---------+---------------+---------+-----------+----------+--------------+ PTV      Full                                                        +---------+---------------+---------+-----------+----------+--------------+ PERO     Full                                                        +---------+---------------+---------+-----------+----------+--------------+    Summary: RIGHT: - Findings consistent with acute deep vein thrombosis involving the right gastrocnemius veins. - No cystic structure found in the popliteal fossa.  LEFT: - There is no  evidence of deep vein thrombosis in the lower extremity.  - No cystic structure found in the popliteal fossa.  *See table(s) above for measurements and observations.    Preliminary     Procedures .Critical Care Performed by: Emeline Darling, PA-C Authorized by: Emeline Darling, PA-C   Critical  care provider statement:    Critical care time (minutes):  45   Critical care was time spent personally by me on the following activities:  Discussions with consultants, evaluation of patient's response to treatment, examination of patient, ordering and performing treatments and interventions, ordering and review of laboratory studies, ordering and review of radiographic studies, pulse oximetry, re-evaluation of patient's condition, obtaining history from patient or surrogate and review of old charts    Medications Ordered in ED Medications  morphine 2 MG/ML injection 2 mg (2 mg Intravenous Patient Refused/Not Given 05/25/21 1347)  ondansetron (ZOFRAN) injection 4 mg (4 mg Intravenous Patient Refused/Not Given 05/25/21 1348)  sodium chloride (PF) 0.9 % injection (has no administration in time range)  iohexol (OMNIPAQUE) 350 MG/ML injection 80 mL (80 mLs Intravenous Contrast Given 05/25/21 1606)    ED Course  I have reviewed the triage vital signs and the nursing notes.  Pertinent labs & imaging results that were available during my care of the patient were reviewed by me and considered in my medical decision making (see chart for details).  Clinical Course as of 05/25/21 1646  Tue May 25, 2021  1628 DVT positive in right leg.  [EH]    Clinical Course User Index [EH] Ollen Gross   MDM Rules/Calculators/A&P                          80 year old female presents with concern for worsening shortness of breath following recent admission for multiple rib fractures with hemopneumothorax.  Patient additionally has new bilateral lower extremity edema.  Hypertensive on intake, tachypneic, with low oxygen saturation of 87% on room air.  Patient placed on 4 L of supplemental oxygen by nasal cannula with improvement in her sats to 100%.  Cardiac exam revealed irregularly irregular rhythm with patient in atrial fibrillation without RVR at this time.  She is tachypneic, splinting her  breathing due to pain on the right side.  There are rales in the right lung base and decreased breath sounds in the left lung base.  Patient with bruising and tenderness palpation over the right anterior and lateral chest wall.  Abdominal exam is benign.  There is bilateral lower extremity edema, 2+ without pitting.  Repeat chest x-ray revealed known multiple displaced rib fractures on the right with right-sided pleural effusion and fairly small right apical pneumothorax, approximately 15% per radiologist, Dr. Jasmine December, as called with critical result to this provider.  CBC with mild anemia with hemoglobin 11.1, CMP unremarkable.  BNP mildly elevated to 480.  Hepatic function panel unremarkable, lactic acid is negative.  General surgery consulted, recommend PE study; pending PE study result, general surgery APP will communicate with Dr. Bobbye Morton, trauma service for disposition recommendation for admission to [facility] long medicine service versus transfer to [facility] for admitted to trauma.  Care as patient signed out to oncoming ED provider, Wyn Quaker, PA-C at time of shift change.  Patient is pending CT angiogram and bilateral lower extremity Dopplers to rule out DVT at this time.  Concern for possible recurring hemopneumothorax on the right, which could be complicated by requirement for anticoagulation should be  or DVT be identified.  All pertinent HPI, physical exam, and laboratory and imaging findings were discussed with oncoming ED provider prior to my departure.  Jamieka voiced understanding for medical evaluation and treatment plan.  Each of her questions was answered to her expressed satisfaction.  She is amenable to plan for admission at this time.  This chart was dictated using voice recognition software, Dragon. Despite the best efforts of this provider to proofread and correct errors, errors may still occur which can change documentation meaning.  Final Clinical Impression(s) / ED  Diagnoses Final diagnoses:  None    Rx / DC Orders ED Discharge Orders    None       Aura Dials 05/25/21 1646    Lacretia Leigh, MD 05/28/21 [phone]

## 2021-05-25 NOTE — ED Provider Notes (Signed)
Melanie [name]    CSN: [id number] Arrival date & time: 05/25/21  4332      History   Chief Complaint Chief Complaint  Patient presents with  . Foot Swelling    Bilateral    HPI Melanie [name] is a 80 y.o. female presenting with pedal edema for 2 days.  Medical history rectal cancer, COPD.  This patient was discharged from the [facility] three days ago for traumatic hemopneumothorax. During hospital admission she developed Afib with RVR and was started on metoprolol and cardizem. Today with pedal edema x2 days, SOB with exertion, occ dizziness though none currently.  4 L of home oxygen.  Generalized weakness. Fatigue.  Denies chest pain.  Has taken her metoprolol but not the Cardizem today.  Denies URI symptoms including cough, congestion. Denies recent travel. Denies new trauma.Marland Kitchen  HPI  Past Medical History:  Diagnosis Date  . Anxiety   . Cancer ([place])    rectal ca  . COPD (chronic obstructive pulmonary disease) ([place])   . Thyroid disease     Patient Active Problem List   Diagnosis Date Noted  . Atrial fibrillation with RVR ([place]) 05/25/2021  . Closed fracture of multiple ribs with flail chest 05/11/2021  . Atherosclerosis of aorta ([place]) 03/30/2021  . Heart murmur 03/30/2021  . SOB (shortness of breath) 03/30/2021  . COPD (chronic obstructive pulmonary disease) ([place]) 09/09/2020  . Adjustment disorder, unspecified 01/11/2019  . Allergic rhinitis 06/18/2018  . Chronic fatigue 01/16/2018  . 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 Anxiety disorder 08/03/2016  . Lumbar back pain with radiculopathy affecting right lower extremity 08/03/2016  . Primary hypertension 04/14/2016  . Hypercholesteremia 04/14/2016  . Multinodular goiter 04/14/2016  . Overactive bladder 04/14/2016  . GERD (gastroesophageal reflux disease) 04/14/2016    Past Surgical History:  Procedure Laterality Date  . ABDOMINAL HYSTERECTOMY    . COLON SURGERY    . IR THORACENTESIS  ASP PLEURAL SPACE W/IMG GUIDE  05/17/2021    OB History   No obstetric history on file.      Home Medications    Prior to Admission medications   Medication Sig Start Date End Date Taking? Authorizing Provider  acetaminophen (TYLENOL) 500 MG tablet Take 2 tablets (1,000 mg total) by mouth every 8 (eight) hours. Patient not taking: Reported on 05/25/2021 05/20/21   Jillyn Ledger, PA-C  acetaminophen (TYLENOL) 650 MG CR tablet Take 650 mg by mouth every 8 (eight) hours as needed for fever or pain.    [provider]  CALCIUM-VITAMIN D PO Take 1 tablet by mouth daily.    [provider]  cetirizine (ZYRTEC) 10 MG tablet Take 0.5 tablets (5 mg total) by mouth daily. Patient taking differently: Take 10 mg by mouth daily. 09/09/20   [place], Betty G, MD  diltiazem (CARDIZEM) 30 MG tablet Take 1 tablet (30 mg total) by mouth every 6 (six) hours. 05/20/21   Maczis, Barth Kirks, PA-C  docusate sodium (COLACE) 100 MG capsule Take 1 capsule (100 mg total) by mouth 2 (two) times daily as needed for mild constipation. 05/20/21   Maczis, Barth Kirks, PA-C  fluticasone (FLONASE) 50 MCG/ACT nasal spray Place 1 spray into both nostrils 2 (two) times daily. Patient taking differently: Place 1 spray into both nostrils 2 (two) times daily as needed for allergies. 11/05/19   [place], Betty G, MD  hydrocortisone cream 1 % Apply 1 application topically 4 (four) times daily as needed for itching.  [provider]  LORazepam (ATIVAN) 0.5 MG tablet Take 0.5-1 tablets (0.25-0.5 mg total) by mouth 2 (two) times daily as needed. for anxiety Patient not taking: Reported on 05/25/2021 03/30/21   [place], Betty G, MD  LORazepam (ATIVAN) 0.5 MG tablet Take 0.5 mg by mouth 2 (two) times daily as needed for anxiety. 04/30/21   [provider]  losartan (COZAAR) 100 MG tablet TAKE 1 TABLET BY MOUTH EVERY DAY Patient not taking: Reported on 05/25/2021 03/22/21   [place], Betty G, MD  metoprolol tartrate  (LOPRESSOR) 50 MG tablet Take 1 tablet (50 mg total) by mouth 2 (two) times daily. 05/20/21   Maczis, Barth Kirks, PA-C  oxyCODONE (OXY IR/ROXICODONE) 5 MG immediate release tablet Take 1 tablet (5 mg total) by mouth every 6 (six) hours as needed for breakthrough pain. 05/20/21   Ileana Roup, MD  pantoprazole (PROTONIX) 20 MG tablet TAKE 1 TABLET BY MOUTH 30 MINUTES BEFORE BREAKFAST. STOP OMEPRAZOLE Patient not taking: Reported on 05/25/2021 05/10/21   [place], Betty G, MD  pantoprazole (PROTONIX) 20 MG tablet Take 20 mg by mouth daily. 05/10/21   [provider]  polyethylene glycol (MIRALAX / GLYCOLAX) 17 g packet Take 17 g by mouth daily as needed. Patient not taking: Reported on 05/25/2021 05/20/21   Jillyn Ledger, PA-C  polyvinyl alcohol (LIQUIFILM TEARS) 1.4 % ophthalmic solution Place 1 drop into both eyes as needed for dry eyes.    [provider]  VITAMIN D PO Take 1 capsule by mouth daily.    [provider]    Family History Family History  Problem Relation Age of Onset  . Cancer Sister        Lung cancer    Social History Social History   Tobacco Use  . Smoking status: Former Smoker    Types: Cigarettes  . Smokeless tobacco: Never Used  Substance Use Topics  . Alcohol use: No  . Drug use: No     Allergies   Latex, Macrodantin, Guaifenesin er, Morphine and related, Ditropan [oxybutynin], Lipitor [atorvastatin], Lisinopril, Magnesium-containing compounds, Pravastatin, and Zoloft [sertraline hcl]   Review of Systems Review of Systems  Constitutional: Positive for fatigue. Negative for appetite change, chills and fever.  HENT: Negative for congestion, ear pain, rhinorrhea, sinus pressure, sinus pain and sore throat.   Eyes: Negative for redness and visual disturbance.  Respiratory: Positive for cough and shortness of breath. Negative for chest tightness and wheezing.   Cardiovascular: Positive for leg swelling. Negative for chest pain and  palpitations.       R chest wall pain   Gastrointestinal: Negative for abdominal pain, constipation, diarrhea, nausea and vomiting.  Genitourinary: Negative for dysuria, frequency and urgency.  Musculoskeletal: Negative for myalgias.  Neurological: Negative for dizziness, weakness and headaches.  Psychiatric/Behavioral: Negative for confusion.  All other systems reviewed and are negative.    Physical Exam Triage Vital Signs ED Triage Vitals [05/25/21 1056]  Enc Vitals Group     BP      Pulse      Resp      Temp      Temp src      SpO2      Weight      Height      Head Circumference      Peak Flow      Pain Score 0     Pain Loc      Pain Edu?      Excl. in [place]?  No data found.  Updated Vital Signs BP (!) 125/44 (BP Location: Left Arm)   Pulse (!) 101   Resp 16   SpO2 100%   Visual Acuity Right Eye Distance:   Left Eye Distance:   Bilateral Distance:    Right Eye Near:   Left Eye Near:    Bilateral Near:     Physical Exam Vitals reviewed.  Constitutional:      Appearance: Normal appearance. She is cachectic. She is not diaphoretic.  HENT:     Head: Normocephalic and atraumatic.     Mouth/Throat:     Mouth: Mucous membranes are moist.  Eyes:     Extraocular Movements: Extraocular movements intact.     Pupils: Pupils are equal, round, and reactive to light.  Cardiovascular:     Rate and Rhythm: Normal rate and regular rhythm.     Pulses:          Radial pulses are 2+ on the right side and 2+ on the left side.       Dorsalis pedis pulses are 1+ on the right side and 1+ on the left side.     Heart sounds: Normal heart sounds.     Comments: Radial pulse equal and symmetric  Pulmonary:     Effort: Accessory muscle usage present. No tachypnea, bradypnea or respiratory distress.     Breath sounds: Rales present. No wheezing or rhonchi.     Comments: Few rales R middle and lower lung fields.  Chest:     Chest wall: Tenderness present.     Comments: R  anterior ribs - tenderness  Abdominal:     Palpations: Abdomen is soft.     Tenderness: There is no abdominal tenderness. There is no guarding or rebound.  Musculoskeletal:     Right lower leg: 2+ Edema present.     Left lower leg: 2+ Edema present.  Skin:    General: Skin is warm.     Capillary Refill: Capillary refill takes less than 2 seconds.  Neurological:     General: No focal deficit present.     Mental Status: She is alert and oriented to person, place, and time.  Psychiatric:        Mood and Affect: Mood normal.        Behavior: Behavior normal.        Thought Content: Thought content normal.        Judgment: Judgment normal.       UC Treatments / Results  Labs (all labs ordered are listed, but only abnormal results are displayed) Labs Reviewed - No data to display  EKG   Radiology DG Chest 2 View  Addendum Date: 05/25/2021   ADDENDUM REPORT: 05/25/2021 13:45 ADDENDUM: Critical Value/emergent results were called by telephone at the time of interpretation on 05/25/2021 at 1:45 pm to provider Silverio Decamp, PA, who verbally acknowledged these results. Electronically Signed   By: Lowella Grip III M.D.   On: 05/25/2021 13:45   Result Date: 05/25/2021 CLINICAL DATA:  Shortness of breath EXAM: CHEST - 2 VIEW COMPARISON:  May 20, 2021. FINDINGS: There is a fairly small right apical pneumothorax without tension component. There are multiple displaced rib fractures on the right. There is a moderate pleural effusion on the right with atelectasis and potential consolidation in portions of the right middle and lower lobes. Left lung clear. Heart size and pulmonary vascularity are normal. No adenopathy. There is degenerative change in the thoracic spine. IMPRESSION: Multiple displaced  rib fractures on the right with right pleural effusion and fairly small right apical pneumothorax. No tension component. Atelectasis with potential consolidation in portions of the right middle  and lower lobes. Left lung clear. Heart size normal. Aortic Atherosclerosis (ICD10-I70.0). Electronically Signed: By: Lowella Grip III M.D. On: 05/25/2021 13:31   CT Angio Chest PE W/Cm &/Or Wo Cm  Result Date: 05/25/2021 CLINICAL DATA:  Shortness of breath. EXAM: CT ANGIOGRAPHY CHEST WITH CONTRAST TECHNIQUE: Multidetector CT imaging of the chest was performed using the standard protocol during bolus administration of intravenous contrast. Multiplanar CT image reconstructions and MIPs were obtained to evaluate the vascular anatomy. CONTRAST:  11mL OMNIPAQUE IOHEXOL 350 MG/ML SOLN COMPARISON:  May 11, 2021. FINDINGS: Cardiovascular: Satisfactory opacification of the pulmonary arteries to the segmental level. No evidence of pulmonary embolism. Normal heart size. No pericardial effusion. Atherosclerosis of thoracic aorta is noted without aneurysm or dissection. Mediastinum/Nodes: Stable findings consistent with multinodular goiter. No adenopathy is noted. Esophagus is unremarkable. Lungs/Pleura: Left lung is clear. Moderate right hydropneumothorax is noted with loculated effusion seen in the right lower lobe and associated atelectasis of the right lower lobe. Upper Abdomen: No acute abnormality. Musculoskeletal: Stable appearance of right segmental rib fractures and flail chest as noted on prior exam. Review of the MIP images confirms the above findings. IMPRESSION: No definite evidence of pulmonary embolus. Moderate right hydropneumothorax is noted with loculated effusion seen in the right lower lobe and associated atelectasis of the right lower lobe. Stable appearance of right segmental rib fractures and flail chest is noted as described on prior exam. Aortic Atherosclerosis (ICD10-I70.0). Electronically Signed   By: Marijo Conception M.D.   On: 05/25/2021 16:38   VAS [place] LOWER EXTREMITY VENOUS (DVT) (ONLY [facility] & [facility] 7a-7p)  Result Date: 05/25/2021  Lower Venous DVT Study Patient Name:  Melanie [name]  Date  of Exam:   05/25/2021 Medical Rec #: [id number]          Accession #:    [id number] Date of Birth: [birth year]           Patient Gender: F Patient Age:   79Y Exam Location:  [facility] Procedure:      VAS [place] LOWER EXTREMITY VENOUS (DVT) Referring Phys: [id number] [place] --------------------------------------------------------------------------------  Indications: Swelling.  Risk Factors: None identified. Comparison Study: No prior studies. Performing Technologist: Oliver Hum RVT  Examination Guidelines: A complete evaluation includes B-mode imaging, spectral Doppler, color Doppler, and power Doppler as needed of all accessible portions of each vessel. Bilateral testing is considered an integral part of a complete examination. Limited examinations for reoccurring indications may be performed as noted. The reflux portion of the exam is performed with the patient in reverse Trendelenburg.  +---------+---------------+---------+-----------+----------+--------------+ RIGHT    CompressibilityPhasicitySpontaneityPropertiesThrombus Aging +---------+---------------+---------+-----------+----------+--------------+ CFV      Full           Yes      Yes                                 +---------+---------------+---------+-----------+----------+--------------+ SFJ      Full                                                        +---------+---------------+---------+-----------+----------+--------------+  FV Prox  Full                                                        +---------+---------------+---------+-----------+----------+--------------+ FV Mid   Full                                                        +---------+---------------+---------+-----------+----------+--------------+ FV DistalFull                                                        +---------+---------------+---------+-----------+----------+--------------+ PFV      Full                                                         +---------+---------------+---------+-----------+----------+--------------+ POP      Full           Yes      Yes                                 +---------+---------------+---------+-----------+----------+--------------+ PTV      Full                                                        +---------+---------------+---------+-----------+----------+--------------+ PERO     Full                                                        +---------+---------------+---------+-----------+----------+--------------+ Gastroc  Partial                                      Acute          +---------+---------------+---------+-----------+----------+--------------+   +---------+---------------+---------+-----------+----------+--------------+ LEFT     CompressibilityPhasicitySpontaneityPropertiesThrombus Aging +---------+---------------+---------+-----------+----------+--------------+ CFV      Full           Yes      Yes                                 +---------+---------------+---------+-----------+----------+--------------+ SFJ      Full                                                        +---------+---------------+---------+-----------+----------+--------------+  FV Prox  Full                                                        +---------+---------------+---------+-----------+----------+--------------+ FV Mid   Full                                                        +---------+---------------+---------+-----------+----------+--------------+ FV DistalFull                                                        +---------+---------------+---------+-----------+----------+--------------+ PFV      Full                                                        +---------+---------------+---------+-----------+----------+--------------+ POP      Full           Yes      Yes                                  +---------+---------------+---------+-----------+----------+--------------+ PTV      Full                                                        +---------+---------------+---------+-----------+----------+--------------+ PERO     Full                                                        +---------+---------------+---------+-----------+----------+--------------+     Summary: RIGHT: - Findings consistent with acute deep vein thrombosis involving the right gastrocnemius veins. - No cystic structure found in the popliteal fossa.  LEFT: - There is no evidence of deep vein thrombosis in the lower extremity.  - No cystic structure found in the popliteal fossa.  *See table(s) above for measurements and observations. Electronically signed by Deitra Mayo MD on 05/25/2021 at 6:43:30 PM.    Final     Procedures Procedures (including critical care time)  Medications Ordered in UC Medications - No data to display  Initial Impression / Assessment and Plan / UC Course  I have reviewed the triage vital signs and the nursing notes.  Pertinent labs & imaging results that were available during my care of the patient were reviewed by me and considered in my medical decision making (see chart for details).     This patient is an 80 year old female presenting with bilateral foot swelling.  This patient was discharged from the hospital on 5/26 following  traumatic hemopneumothorax.  In her hospital course, she was intubated and then extubated. Thoracentesis was performed for concurrent pelural effusion.  She developed A. fib with RVR during her stay and was started on metoprolol and Cardizem. An echo was performed during admission but was stopped 2/2 pain but the portion of the echo that was perfomed reports grossly persevered LVEF She was discharged for follow-up with outpatient cardiology and PCP, which has not happened yet.  Prior to this, patient was not on home oxygen; she was discharged on 4 L of  home oxygen.  Today, she is hypotensive at 125/44, has only taken her metoprolol so far today.  Pulse of 101. Afebrile, nontachypneic, oxygenating well on 4L O2 by nasal cannula.  Rales R lower lung fields.  She does have 2+ pedal edema in bilateral ankles and feet. Has complained of dizziness and shortness of breath at home though none currently.  Sending this patient to ED for further evaluation and management.  Hemodynamically stable for transport in personal vehicle at this time.  6/1 update, this patient was admitted for recurrence of hydropneumothorax.   Coding this visit a Level 5 as this patient was admitted for hydropneumothorax.   Final Clinical Impressions(s) / UC Diagnoses   Final diagnoses:  Pedal edema  Atrial fibrillation, unspecified type [facility])  Hydropneumothorax     Discharge Instructions     -Head straight to [facility] emergency department for further evaluation and management of symptoms. -If symptoms get worse while on the way like shortness of breath, chest pain, dizziness- stop and call 911.    ED Prescriptions    None     PDMP not reviewed this encounter.   Hazel Sams, PA-C 05/26/21 [phone]

## 2021-05-25 NOTE — Consult Note (Addendum)
Consult Note  Melanie [name] 21-Sep-1941  [id number].    Requesting MD: Dr. Lacretia Leigh Chief Complaint/Reason for Consult: lower extremity edema, pleural effusion with history of rib fractures  HPI:  80 yo female with past medical history of COPD who was recently admitted to [facility] trauma service from 05/11/2021 - 05/20/2021 (5 days ago) for right rib fractures with hemopneumothorax following fall from bed. During admission she was intubated/extubated, had right pigtail drain placed/removed, and underwent IR thoracentesis for pleural effusion. During that admission she also developed afib RVR for which she was evaluated by cardiology and placed on metoprolol and Cardizem with recommendation for outpatient follow up. She required supplemental O2 during admission and was discharged home on 3 lpm [place].   She presents to the ED today with her daughter-in-law for complaint of new bilateral lower extremity edema that has been present for the past 2-3 days. She had tried to see her PCP but they did not have appointments available. She denies she has had SHOB or worsening rib pain though she is requiring 4 lpm supplemental O2 in the ED. She slips in a recline and does not have her feet elevated at night. Daughter-in-law reports she only has shortness of breath at home when she removes supplemental O2 and forgets to replace it. She has not had fever or chills. Eating and drinking well without nausea or emesis. Denies constipation or diarrhea. No urinary complaints  In the ED she has accelerated heart rate >120 with atrial fibrillation. Daughter in law reports compliance with Cardizem and metoprolol. She states Cardizem leads to confusion. She is not on anticoagulation.  ROS: Review of Systems  Constitutional: Negative for chills and fever.  Respiratory: Negative for cough, shortness of breath and wheezing.   Cardiovascular: Positive for palpitations and leg swelling. Negative for chest pain.   Gastrointestinal: Negative for abdominal pain, constipation, diarrhea, nausea and vomiting.    Family History  Problem Relation Age of Onset  . Cancer Sister        Lung cancer    Past Medical History:  Diagnosis Date  . Anxiety   . Cancer ([place])    rectal ca  . COPD (chronic obstructive pulmonary disease) ([place])   . Thyroid disease     Past Surgical History:  Procedure Laterality Date  . ABDOMINAL HYSTERECTOMY    . COLON SURGERY    . IR THORACENTESIS ASP PLEURAL SPACE W/IMG GUIDE  05/17/2021    Social History:  reports that she has quit smoking. Her smoking use included cigarettes. She has never used smokeless tobacco. She reports that she does not drink alcohol and does not use drugs.  Allergies:  Allergies  Allergen Reactions  . Latex Rash  . Macrodantin Shortness Of Breath  . Guaifenesin Er Itching  . Morphine And Related Nausea Only  . Ditropan [Oxybutynin]     Causes diarrhea - contains Magnesium  . Lipitor [Atorvastatin]     rash  . Lisinopril     Diarrhea/cough  . Magnesium-Containing Compounds     Causes diarrhea  . Pravastatin     Leg cramps  . Zoloft [Sertraline Hcl]     Diarrhea    (Not in a hospital admission)   Blood pressure (!) 157/76, pulse 70, temperature 98.7 F (37.1 C), temperature source Oral, resp. rate (!) 32, SpO2 100 %. Physical Exam:  General: pleasant, WD, female who is laying in bed in NAD HEENT: head is normocephalic, atraumatic.  Sclera are  noninjected.  Pupils equal and round.  Ears and nose without any masses or lesions.  Mouth is pink and moist Heart: tachycardic, irregular.  Normal s1,s2. No obvious murmurs, gallops, or rubs noted.  Palpable radial and pedal pulses bilaterally Lungs: Left UL and LL clear to auscultation. Diminished right lower lobe breath sounds. No wheezing or rhonchi. Respiratory effort nonlabored on 4 lpm supplemental O2 Abd: soft, NT, ND, no masses, hernias, or organomegaly MS: bilateral upper  extremities without cyanosis or edema. Bilateral lower extremities with nonpitting edema to the level of the ankle. No calf tenderness bilaterally Skin: warm and dry with no masses, lesions, or rashes. Ecchymosis to right flank/lateral chest Neuro: Cranial nerves 2-12 grossly intact, sensation is normal throughout Psych: A&Ox3 with an appropriate affect.   Results for orders placed or performed during the hospital encounter of 05/25/21 (from the past 48 hour(s))  Basic metabolic panel     Status: Abnormal   Collection Time: 05/25/21  1:56 PM  Result Value Ref Range   Sodium 134 (L) 135 - 145 mmol/L   Potassium 4.6 3.5 - 5.1 mmol/L   Chloride 94 (L) 98 - 111 mmol/L   CO2 33 (H) 22 - 32 mmol/L   Glucose, Bld 111 (H) 70 - 99 mg/dL    Comment: Glucose reference range applies only to samples taken after fasting for at least 8 hours.   BUN 19 8 - 23 mg/dL   Creatinine, Ser 0.72 0.44 - 1.00 mg/dL   Calcium 9.5 8.9 - 10.3 mg/dL   GFR, Estimated >60 >60 mL/min    Comment: (NOTE) Calculated using the CKD-EPI Creatinine Equation (2021)    Anion gap 7 5 - 15    Comment: Performed at [facility], [place] [address] [place], [place] [address]  CBC with Differential     Status: Abnormal   Collection Time: 05/25/21  1:56 PM  Result Value Ref Range   WBC 8.8 4.0 - 10.5 K/uL   RBC 3.63 (L) 3.87 - 5.11 MIL/uL   Hemoglobin 11.1 (L) 12.0 - 15.0 g/dL   HCT 34.5 (L) 36.0 - 46.0 %   MCV 95.0 80.0 - 100.0 fL   MCH 30.6 26.0 - 34.0 pg   MCHC 32.2 30.0 - 36.0 g/dL   RDW 13.0 11.5 - 15.5 %   Platelets 403 (H) 150 - 400 K/uL   nRBC 0.0 0.0 - 0.2 %   Neutrophils Relative % 85 %   Neutro Abs 7.5 1.7 - 7.7 K/uL   Lymphocytes Relative 6 %   Lymphs Abs 0.5 (L) 0.7 - 4.0 K/uL   Monocytes Relative 7 %   Monocytes Absolute 0.6 0.1 - 1.0 K/uL   Eosinophils Relative 2 %   Eosinophils Absolute 0.2 0.0 - 0.5 K/uL   Basophils Relative 0 %   Basophils Absolute 0.0 0.0 - 0.1 K/uL   Immature  Granulocytes 0 %   Abs Immature Granulocytes 0.02 0.00 - 0.07 K/uL    Comment: Performed at [facility], [place] [address], [place], [place] [address]  Lactic acid, plasma     Status: None   Collection Time: 05/25/21  2:00 PM  Result Value Ref Range   Lactic Acid, Venous 0.8 0.5 - 1.9 mmol/L    Comment: Performed at [facility], [place] [address] [place], [place] [address]   DG Chest 2 View  Addendum Date: 05/25/2021   ADDENDUM REPORT: 05/25/2021 13:45 ADDENDUM: Critical Value/emergent results were called by telephone  at the time of interpretation on 05/25/2021 at 1:45 pm to provider Silverio Decamp, PA, who verbally acknowledged these results. Electronically Signed   By: Lowella Grip III M.D.   On: 05/25/2021 13:45   Result Date: 05/25/2021 CLINICAL DATA:  Shortness of breath EXAM: CHEST - 2 VIEW COMPARISON:  May 20, 2021. FINDINGS: There is a fairly small right apical pneumothorax without tension component. There are multiple displaced rib fractures on the right. There is a moderate pleural effusion on the right with atelectasis and potential consolidation in portions of the right middle and lower lobes. Left lung clear. Heart size and pulmonary vascularity are normal. No adenopathy. There is degenerative change in the thoracic spine. IMPRESSION: Multiple displaced rib fractures on the right with right pleural effusion and fairly small right apical pneumothorax. No tension component. Atelectasis with potential consolidation in portions of the right middle and lower lobes. Left lung clear. Heart size normal. Aortic Atherosclerosis (ICD10-I70.0). Electronically Signed: By: Lowella Grip III M.D. On: 05/25/2021 13:31      Assessment/Plan HTN Segmental R rib FX 6-10 with PTXand traumatic pneumatoceles- hx of CT removed 5/21 R pleural effusion s/p thoracentesis 5/23  CXR in the ED with Multiple displaced rib fractures on the right with right pleural  effusion and fairly small right apical pneumothorax  Given afib and increased O2 demand recommend CTA to rule out PE  Winferd Humphrey, [facility] 05/25/2021, 3:07 PM Please see Amion for pager number during day hours 7:00am-4:30pm

## 2021-05-25 NOTE — Progress Notes (Signed)
Ideal for heparin Indication: acute  DVT, afib  Allergies  Allergen Reactions  . Latex Rash  . Macrodantin Shortness Of Breath  . Guaifenesin Er Itching  . Morphine And Related Nausea Only  . Ditropan [Oxybutynin]     Causes diarrhea - contains Magnesium  . Lipitor [Atorvastatin]     rash  . Lisinopril     Diarrhea/cough  . Magnesium-Containing Compounds     Causes diarrhea  . Pravastatin     Leg cramps  . Zoloft [Sertraline Hcl]     Diarrhea    Patient Measurements: Weight: 61.2 kg (135 lb) Heparin Dosing Weight: 61 kg  Vital Signs: Temp: 98.7 F (37.1 C) (05/31 1155) Temp Source: Oral (05/31 1155) BP: 153/96 (05/31 1745) Pulse Rate: 118 (05/31 1630)  Labs: Recent Labs    05/25/21 1356  HGB 11.1*  HCT 34.5*  PLT 403*  CREATININE 0.72    Estimated Creatinine Clearance: 53.3 mL/min (by C-G formula based on SCr of 0.72 mg/dL).   Assessment: Patient is an 80 y.o F who was hospitalized a week prior to this admission for fall with right rib fractures and hemopneumothorax, presented to the ED on 5/31 with c/o LE swelling and SOB.  LE doppler showed "acute deep vein thrombosis involving the right  gastrocnemius veins."  Pharmacy has been consulted to dose heparin for acute DVT and afib.    Goal of Therapy:  Heparin level 0.3-0.7 units/ml Monitor platelets by anticoagulation protocol: Yes   Plan:  - heparin 2000 units IV x1, then drip for 1000 units/hr - check 8 hr heparin level - monitor for s/sx bleeding  Angelina Venard P 05/25/2021,5:55 PM

## 2021-05-25 NOTE — Progress Notes (Signed)
Bilateral lower extremity venous duplex has been completed. Preliminary results can be found in CV Proc through chart review.  Results were given to Wyn Quaker PA.  05/25/21 4:28 PM Carlos Levering RVT

## 2021-05-25 NOTE — ED Provider Notes (Signed)
I assumed care of patient at shift change from previous team, please see their note for full H&P. Briefly patient was recently discharged from the trauma service and is back with worsening pain.    ED Course/Procedures   Clinical Course as of 05/25/21 1730  Tue May 25, 2021  1628 DVT positive in right leg.  [EH]  1715 I spoke with Hospitalist.  They were concerned about who should admit the patient.  They will speak with surgery.  [EH]    Clinical Course User Index [EH] Ollen Gross   DG Chest 1 View  Result Date: 05/17/2021 CLINICAL DATA:  Status post right-sided thoracentesis EXAM: CHEST  1 VIEW COMPARISON:  Film from earlier in the same day. FINDINGS: Significant reduction in right-sided pleural effusion is noted. Small residual effusion is seen. No pneumothorax is noted. Cardiac shadow is stable. Left lung remains clear. Multiple right-sided rib fractures are again noted. IMPRESSION: No pneumothorax following right-sided thoracentesis. Small residual effusion is seen. Electronically Signed   By: Inez Catalina M.D.   On: 05/17/2021 16:22   DG Chest 2 View  Addendum Date: 05/25/2021   ADDENDUM REPORT: 05/25/2021 13:45 ADDENDUM: Critical Value/emergent results were called by telephone at the time of interpretation on 05/25/2021 at 1:45 pm to provider Silverio Decamp, PA, who verbally acknowledged these results. Electronically Signed   By: Lowella Grip III M.D.   On: 05/25/2021 13:45   Result Date: 05/25/2021 CLINICAL DATA:  Shortness of breath EXAM: CHEST - 2 VIEW COMPARISON:  May 20, 2021. FINDINGS: There is a fairly small right apical pneumothorax without tension component. There are multiple displaced rib fractures on the right. There is a moderate pleural effusion on the right with atelectasis and potential consolidation in portions of the right middle and lower lobes. Left lung clear. Heart size and pulmonary vascularity are normal. No adenopathy. There is  degenerative change in the thoracic spine. IMPRESSION: Multiple displaced rib fractures on the right with right pleural effusion and fairly small right apical pneumothorax. No tension component. Atelectasis with potential consolidation in portions of the right middle and lower lobes. Left lung clear. Heart size normal. Aortic Atherosclerosis (ICD10-I70.0). Electronically Signed: By: Lowella Grip III M.D. On: 05/25/2021 13:31   CT Head Wo Contrast  Result Date: 05/11/2021 CLINICAL DATA:  Fall, head injury, neck pain, right chest pain, flail chest, hypoxia abdominal injury EXAM: CT HEAD WITHOUT CONTRAST CT CERVICAL SPINE WITHOUT CONTRAST CT CHEST, ABDOMEN AND PELVIS WITH CONTRAST TECHNIQUE: Contiguous axial images were obtained from the base of the skull through the vertex without intravenous contrast. Multidetector CT imaging of the cervical spine was performed without intravenous contrast. Multiplanar CT image reconstructions were also generated. Multidetector CT imaging of the chest, abdomen and pelvis was performed following the standard protocol during bolus administration of intravenous contrast. CONTRAST:  83mL OMNIPAQUE IOHEXOL 300 MG/ML  SOLN COMPARISON:  None. FINDINGS: CT HEAD FINDINGS Brain: Normal anatomic configuration. Parenchymal volume loss is commensurate with the patient's age. Moderate periventricular white matter changes are present likely reflecting the sequela of small vessel ischemia. No abnormal intra or extra-axial mass lesion or fluid collection. No abnormal mass effect or midline shift. No evidence of acute intracranial hemorrhage or infarct. Ventricular size is normal. Cerebellum unremarkable. Vascular: No asymmetric hyperdense vasculature at the skull base. Skull: Intact Sinuses/Orbits: Paranasal sinuses are clear. Orbits are unremarkable. Other: Mastoid air cells and middle ear cavities are clear. CT CERVICAL FINDINGS Alignment: Normal cervical lordosis.  No listhesis. Skull  base and vertebrae: The craniocervical junction is unremarkable. The atlantodental interval is not widened. 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 No acute fracture of the cervical spine. Soft tissues and spinal canal: There is extensive subcutaneous gas within the a soft tissues of the right neck. No prevertebral soft tissue swelling. No paraspinal fluid collections. No canal hematoma. The spinal canal is widely patent. Disc levels: There is intervertebral disc space narrowing and endplate remodeling at N4-6 and C6-7 in keeping with changes of moderate to severe degenerative disc disease. 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 Remaining intervertebral disc spaces are preserved. Vertebral body heights are preserved. The prevertebral soft tissues are not thickened. The spinal canal is widely patent on sagittal reformats. Axial images demonstrates multilevel uncovertebral and facet arthrosis resulting in moderate neuroforaminal narrowing on the left at C3-4 and C4-5. Other:  Thyroid goiter noted. CT CHEST FINDINGS Cardiovascular: No significant coronary artery calcification. Global cardiac size within normal limits. No pericardial effusion. Central pulmonary arteries are mildly enlarged in keeping with changes of pulmonary arterial hypertension. Moderate atherosclerotic calcification is seen within the thoracic aorta. No aortic aneurysm. Mediastinum/Nodes: Endotracheal tube seen 4.6 cm above the carina. Nasogastric tube extends into the distal body of the stomach. The esophagus is otherwise unremarkable. No pneumomediastinum. No mediastinal hematoma. No pathologic thoracic adenopathy. Multinodular thyroid goiter noted with nodules measuring up to 9 mm. Lungs/Pleura: Moderate right pneumothorax is present. No mediastinal shift to suggest tension physiology. Right small bore chest tube is in place which initially tracks within the minor fissure, but centrally appears to  penetrate the right upper lobe pulmonary parenchyma. There is focal consolidation within the a dependent right lower lobe. Multiple small posttraumatic pneumatocele is a are seen within the lateral segment of the right lower lobe, best seen on image # 110. Small right mixed attenuation pleural effusion is present in keeping with a hemothorax. Mild centrilobular emphysema. Left lung is clear. No pneumothorax or pleural effusion on the left. Musculoskeletal: There are displaced fractures of the right sixth, seventh, eighth, ninth, and tenth ribs in 2 places resulting in a free-floating segment of the posterior thoracic cage. Significantly displaced right eighth rib fracture fragment appears adjacent to the multiple traumatic pneumatocele seen within the right lower lobe. There is extensive subcutaneous gas within the right chest wall. CT ABDOMEN PELVIS FINDINGS Hepatobiliary: No focal liver abnormality is seen. No gallstones, gallbladder wall thickening, or biliary dilatation. Pancreas: Unremarkable Spleen: Unremarkable Adrenals/Urinary Tract: The adrenal glands are unremarkable. The kidneys are normal in size and position. Multiple simple cortical cysts are seen within the right kidney. The kidneys are otherwise unremarkable. The bladder is unremarkable save for mild pelvic descent with a resultant cystocele. Stomach/Bowel: Surgical changes of sigmoid colectomy, partial distal small-bowel resection, and appendectomy are identified. The residual stomach, small bowel, and large bowel are otherwise unremarkable Vascular/Lymphatic: Moderate aortoiliac atherosclerotic calcification. No aortic aneurysm. Retroaortic left renal vein. No pathologic adenopathy within the abdomen and pelvis. Reproductive: Status post hysterectomy. No adnexal masses. Other: Pelvic descent noted.  No abdominal wall hernia. Musculoskeletal: Advanced degenerative changes are seen at the lumbosacral junction with grade 1 anterolisthesis of a L4 upon  L5 and mild retrolisthesis of L5 upon S1. There are chronic appearing erosive changes involving the L5 vertebral body with approximately 50% loss of height. No acute bone abnormality within the abdomen and pelvis. IMPRESSION: No acute intracranial injury.  No calvarial fracture. No acute fracture  or listhesis of the cervical spine. Right flail chest with fractures of the right 6-10 ribs in multiple locations resulting in a free-floating posterior thoracic cage segment. Displaced eighth rib fragment appears in close proximity to multiple traumatic pneumatocele is a within the right lower lobe. Moderate right hemo pneumothorax. Right chest tube is in place but may penetrate the right upper lobe pulmonary parenchyma centrally. Right lower lobe posterior basal consolidation, likely posttraumatic in nature. Mild centrilobular emphysema. Morphologic changes in keeping with pulmonary arterial hypertension. No acute intra-abdominal injury. These results were called by telephone at the time of interpretation on 05/11/2021 at 2:06 am to provider [facility] , who verbally acknowledged these results. Aortic Atherosclerosis (ICD10-I70.0) and Emphysema (ICD10-J43.9). Electronically Signed   By: Fidela Salisbury MD   On: 05/11/2021 02:11   CT Angio Chest PE W/Cm &/Or Wo Cm  Result Date: 05/25/2021 CLINICAL DATA:  Shortness of breath. EXAM: CT ANGIOGRAPHY CHEST WITH CONTRAST TECHNIQUE: Multidetector CT imaging of the chest was performed using the standard protocol during bolus administration of intravenous contrast. Multiplanar CT image reconstructions and MIPs were obtained to evaluate the vascular anatomy. CONTRAST:  79mL OMNIPAQUE IOHEXOL 350 MG/ML SOLN COMPARISON:  May 11, 2021. FINDINGS: Cardiovascular: Satisfactory opacification of the pulmonary arteries to the segmental level. No evidence of pulmonary embolism. Normal heart size. No pericardial effusion. Atherosclerosis of thoracic aorta is noted without aneurysm  or dissection. Mediastinum/Nodes: Stable findings consistent with multinodular goiter. No adenopathy is noted. Esophagus is unremarkable. Lungs/Pleura: Left lung is clear. Moderate right hydropneumothorax is noted with loculated effusion seen in the right lower lobe and associated atelectasis of the right lower lobe. Upper Abdomen: No acute abnormality. Musculoskeletal: Stable appearance of right segmental rib fractures and flail chest as noted on prior exam. Review of the MIP images confirms the above findings. IMPRESSION: No definite evidence of pulmonary embolus. Moderate right hydropneumothorax is noted with loculated effusion seen in the right lower lobe and associated atelectasis of the right lower lobe. Stable appearance of right segmental rib fractures and flail chest is noted as described on prior exam. Aortic Atherosclerosis (ICD10-I70.0). Electronically Signed   By: Marijo Conception M.D.   On: 05/25/2021 16:38   CT Cervical Spine Wo Contrast  Result Date: 05/11/2021 CLINICAL DATA:  Fall, head injury, neck pain, right chest pain, flail chest, hypoxia abdominal injury EXAM: CT HEAD WITHOUT CONTRAST CT CERVICAL SPINE WITHOUT CONTRAST CT CHEST, ABDOMEN AND PELVIS WITH CONTRAST TECHNIQUE: Contiguous axial images were obtained from the base of the skull through the vertex without intravenous contrast. Multidetector CT imaging of the cervical spine was performed without intravenous contrast. Multiplanar CT image reconstructions were also generated. Multidetector CT imaging of the chest, abdomen and pelvis was performed following the standard protocol during bolus administration of intravenous contrast. CONTRAST:  69mL OMNIPAQUE IOHEXOL 300 MG/ML  SOLN COMPARISON:  None. FINDINGS: CT HEAD FINDINGS Brain: Normal anatomic configuration. Parenchymal volume loss is commensurate with the patient's age. Moderate periventricular white matter changes are present likely reflecting the sequela of small vessel ischemia.  No abnormal intra or extra-axial mass lesion or fluid collection. No abnormal mass effect or midline shift. No evidence of acute intracranial hemorrhage or infarct. Ventricular size is normal. Cerebellum unremarkable. Vascular: No asymmetric hyperdense vasculature at the skull base. Skull: Intact Sinuses/Orbits: Paranasal sinuses are clear. Orbits are unremarkable. Other: Mastoid air cells and middle ear cavities are clear. CT CERVICAL FINDINGS Alignment: Normal cervical lordosis.  No listhesis. Skull base and vertebrae: The craniocervical  junction is unremarkable. The atlantodental interval is not widened. 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 No acute fracture of the cervical spine. Soft tissues and spinal canal: There is extensive subcutaneous gas within the a soft tissues of the right neck. No prevertebral soft tissue swelling. No paraspinal fluid collections. No canal hematoma. The spinal canal is widely patent. Disc levels: There is intervertebral disc space narrowing and endplate remodeling at G2-5 and C6-7 in keeping with changes of moderate to severe degenerative disc disease. 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 Remaining intervertebral disc spaces are preserved. Vertebral body heights are preserved. The prevertebral soft tissues are not thickened. The spinal canal is widely patent on sagittal reformats. Axial images demonstrates multilevel uncovertebral and facet arthrosis resulting in moderate neuroforaminal narrowing on the left at C3-4 and C4-5. Other:  Thyroid goiter noted. CT CHEST FINDINGS Cardiovascular: No significant coronary artery calcification. Global cardiac size within normal limits. No pericardial effusion. Central pulmonary arteries are mildly enlarged in keeping with changes of pulmonary arterial hypertension. Moderate atherosclerotic calcification is seen within the thoracic aorta. No aortic aneurysm. Mediastinum/Nodes: Endotracheal  tube seen 4.6 cm above the carina. Nasogastric tube extends into the distal body of the stomach. The esophagus is otherwise unremarkable. No pneumomediastinum. No mediastinal hematoma. No pathologic thoracic adenopathy. Multinodular thyroid goiter noted with nodules measuring up to 9 mm. Lungs/Pleura: Moderate right pneumothorax is present. No mediastinal shift to suggest tension physiology. Right small bore chest tube is in place which initially tracks within the minor fissure, but centrally appears to penetrate the right upper lobe pulmonary parenchyma. There is focal consolidation within the a dependent right lower lobe. Multiple small posttraumatic pneumatocele is a are seen within the lateral segment of the right lower lobe, best seen on image # 110. Small right mixed attenuation pleural effusion is present in keeping with a hemothorax. Mild centrilobular emphysema. Left lung is clear. No pneumothorax or pleural effusion on the left. Musculoskeletal: There are displaced fractures of the right sixth, seventh, eighth, ninth, and tenth ribs in 2 places resulting in a free-floating segment of the posterior thoracic cage. Significantly displaced right eighth rib fracture fragment appears adjacent to the multiple traumatic pneumatocele seen within the right lower lobe. There is extensive subcutaneous gas within the right chest wall. CT ABDOMEN PELVIS FINDINGS Hepatobiliary: No focal liver abnormality is seen. No gallstones, gallbladder wall thickening, or biliary dilatation. Pancreas: Unremarkable Spleen: Unremarkable Adrenals/Urinary Tract: The adrenal glands are unremarkable. The kidneys are normal in size and position. Multiple simple cortical cysts are seen within the right kidney. The kidneys are otherwise unremarkable. The bladder is unremarkable save for mild pelvic descent with a resultant cystocele. Stomach/Bowel: Surgical changes of sigmoid colectomy, partial distal small-bowel resection, and appendectomy  are identified. The residual stomach, small bowel, and large bowel are otherwise unremarkable Vascular/Lymphatic: Moderate aortoiliac atherosclerotic calcification. No aortic aneurysm. Retroaortic left renal vein. No pathologic adenopathy within the abdomen and pelvis. Reproductive: Status post hysterectomy. No adnexal masses. Other: Pelvic descent noted.  No abdominal wall hernia. Musculoskeletal: Advanced degenerative changes are seen at the lumbosacral junction with grade 1 anterolisthesis of a L4 upon L5 and mild retrolisthesis of L5 upon S1. There are chronic appearing erosive changes involving the L5 vertebral body with approximately 50% loss of height. No acute bone abnormality within the abdomen and pelvis. IMPRESSION: No acute intracranial injury.  No calvarial fracture. No acute fracture or listhesis of the cervical  spine. Right flail chest with fractures of the right 6-10 ribs in multiple locations resulting in a free-floating posterior thoracic cage segment. Displaced eighth rib fragment appears in close proximity to multiple traumatic pneumatocele is a within the right lower lobe. Moderate right hemo pneumothorax. Right chest tube is in place but may penetrate the right upper lobe pulmonary parenchyma centrally. Right lower lobe posterior basal consolidation, likely posttraumatic in nature. Mild centrilobular emphysema. Morphologic changes in keeping with pulmonary arterial hypertension. No acute intra-abdominal injury. These results were called by telephone at the time of interpretation on 05/11/2021 at 2:06 am to provider [facility] , who verbally acknowledged these results. Aortic Atherosclerosis (ICD10-I70.0) and Emphysema (ICD10-J43.9). Electronically Signed   By: Fidela Salisbury MD   On: 05/11/2021 02:11   DG Pelvis Portable  Result Date: 05/11/2021 CLINICAL DATA:  Fall, right hip pain EXAM: PORTABLE PELVIS 1-2 VIEWS COMPARISON:  None. FINDINGS: There is no evidence of pelvic fracture  or diastasis. No pelvic bone lesions are seen. IMPRESSION: Negative. Electronically Signed   By: Fidela Salisbury MD   On: 05/11/2021 02:11   CT CHEST ABDOMEN PELVIS W CONTRAST  Result Date: 05/11/2021 CLINICAL DATA:  Fall, head injury, neck pain, right chest pain, flail chest, hypoxia abdominal injury EXAM: CT HEAD WITHOUT CONTRAST CT CERVICAL SPINE WITHOUT CONTRAST CT CHEST, ABDOMEN AND PELVIS WITH CONTRAST TECHNIQUE: Contiguous axial images were obtained from the base of the skull through the vertex without intravenous contrast. Multidetector CT imaging of the cervical spine was performed without intravenous contrast. Multiplanar CT image reconstructions were also generated. Multidetector CT imaging of the chest, abdomen and pelvis was performed following the standard protocol during bolus administration of intravenous contrast. CONTRAST:  4mL OMNIPAQUE IOHEXOL 300 MG/ML  SOLN COMPARISON:  None. FINDINGS: CT HEAD FINDINGS Brain: Normal anatomic configuration. Parenchymal volume loss is commensurate with the patient's age. Moderate periventricular white matter changes are present likely reflecting the sequela of small vessel ischemia. No abnormal intra or extra-axial mass lesion or fluid collection. No abnormal mass effect or midline shift. No evidence of acute intracranial hemorrhage or infarct. Ventricular size is normal. Cerebellum unremarkable. Vascular: No asymmetric hyperdense vasculature at the skull base. Skull: Intact Sinuses/Orbits: Paranasal sinuses are clear. Orbits are unremarkable. Other: Mastoid air cells and middle ear cavities are clear. CT CERVICAL FINDINGS Alignment: Normal cervical lordosis.  No listhesis. Skull base and vertebrae: The craniocervical junction is unremarkable. The atlantodental interval is not widened. 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 No acute fracture of the cervical spine. Soft tissues and spinal canal: There is extensive subcutaneous gas  within the a soft tissues of the right neck. No prevertebral soft tissue swelling. No paraspinal fluid collections. No canal hematoma. The spinal canal is widely patent. Disc levels: There is intervertebral disc space narrowing and endplate remodeling at A8-3 and C6-7 in keeping with changes of moderate to severe degenerative disc disease. 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 Remaining intervertebral disc spaces are preserved. Vertebral body heights are preserved. The prevertebral soft tissues are not thickened. The spinal canal is widely patent on sagittal reformats. Axial images demonstrates multilevel uncovertebral and facet arthrosis resulting in moderate neuroforaminal narrowing on the left at C3-4 and C4-5. Other:  Thyroid goiter noted. CT CHEST FINDINGS Cardiovascular: No significant coronary artery calcification. Global cardiac size within normal limits. No pericardial effusion. Central pulmonary arteries are mildly enlarged in keeping with changes of pulmonary arterial hypertension.  Moderate atherosclerotic calcification is seen within the thoracic aorta. No aortic aneurysm. Mediastinum/Nodes: Endotracheal tube seen 4.6 cm above the carina. Nasogastric tube extends into the distal body of the stomach. The esophagus is otherwise unremarkable. No pneumomediastinum. No mediastinal hematoma. No pathologic thoracic adenopathy. Multinodular thyroid goiter noted with nodules measuring up to 9 mm. Lungs/Pleura: Moderate right pneumothorax is present. No mediastinal shift to suggest tension physiology. Right small bore chest tube is in place which initially tracks within the minor fissure, but centrally appears to penetrate the right upper lobe pulmonary parenchyma. There is focal consolidation within the a dependent right lower lobe. Multiple small posttraumatic pneumatocele is a are seen within the lateral segment of the right lower lobe, best seen on image # 110. Small right mixed  attenuation pleural effusion is present in keeping with a hemothorax. Mild centrilobular emphysema. Left lung is clear. No pneumothorax or pleural effusion on the left. Musculoskeletal: There are displaced fractures of the right sixth, seventh, eighth, ninth, and tenth ribs in 2 places resulting in a free-floating segment of the posterior thoracic cage. Significantly displaced right eighth rib fracture fragment appears adjacent to the multiple traumatic pneumatocele seen within the right lower lobe. There is extensive subcutaneous gas within the right chest wall. CT ABDOMEN PELVIS FINDINGS Hepatobiliary: No focal liver abnormality is seen. No gallstones, gallbladder wall thickening, or biliary dilatation. Pancreas: Unremarkable Spleen: Unremarkable Adrenals/Urinary Tract: The adrenal glands are unremarkable. The kidneys are normal in size and position. Multiple simple cortical cysts are seen within the right kidney. The kidneys are otherwise unremarkable. The bladder is unremarkable save for mild pelvic descent with a resultant cystocele. Stomach/Bowel: Surgical changes of sigmoid colectomy, partial distal small-bowel resection, and appendectomy are identified. The residual stomach, small bowel, and large bowel are otherwise unremarkable Vascular/Lymphatic: Moderate aortoiliac atherosclerotic calcification. No aortic aneurysm. Retroaortic left renal vein. No pathologic adenopathy within the abdomen and pelvis. Reproductive: Status post hysterectomy. No adnexal masses. Other: Pelvic descent noted.  No abdominal wall hernia. Musculoskeletal: Advanced degenerative changes are seen at the lumbosacral junction with grade 1 anterolisthesis of a L4 upon L5 and mild retrolisthesis of L5 upon S1. There are chronic appearing erosive changes involving the L5 vertebral body with approximately 50% loss of height. No acute bone abnormality within the abdomen and pelvis. IMPRESSION: No acute intracranial injury.  No calvarial  fracture. No acute fracture or listhesis of the cervical spine. Right flail chest with fractures of the right 6-10 ribs in multiple locations resulting in a free-floating posterior thoracic cage segment. Displaced eighth rib fragment appears in close proximity to multiple traumatic pneumatocele is a within the right lower lobe. Moderate right hemo pneumothorax. Right chest tube is in place but may penetrate the right upper lobe pulmonary parenchyma centrally. Right lower lobe posterior basal consolidation, likely posttraumatic in nature. Mild centrilobular emphysema. Morphologic changes in keeping with pulmonary arterial hypertension. No acute intra-abdominal injury. These results were called by telephone at the time of interpretation on 05/11/2021 at 2:06 am to provider [facility] , who verbally acknowledged these results. Aortic Atherosclerosis (ICD10-I70.0) and Emphysema (ICD10-J43.9). Electronically Signed   By: Fidela Salisbury MD   On: 05/11/2021 02:11   DG CHEST PORT 1 VIEW  Result Date: 05/20/2021 CLINICAL DATA:  Pneumothorax.  Rib fractures. EXAM: PORTABLE CHEST 1 VIEW COMPARISON:  05/19/2021.  CT 05/11/2021. FINDINGS: Mediastinum and hilar structures normal. Heart size normal. Persistent right base atelectasis/infiltrate. Small right pleural effusion again noted. No pneumothorax noted on today's exam. Thoracic  fractures best identified by prior CT. IMPRESSION: Persistent right base atelectasis/infiltrate. Small right pleural effusion again noted. No pneumothorax noted on today's exam. Electronically Signed   By: Marcello Moores  Register   On: 05/20/2021 07:07   DG CHEST PORT 1 VIEW  Result Date: 05/19/2021 CLINICAL DATA:  Shortness of breath.  Rib fracture. EXAM: PORTABLE CHEST 1 VIEW COMPARISON:  Chest x-ray 05/18/2021, CT chest 05/11/2021 FINDINGS: The heart size and mediastinal contours are within normal limits. Biapical pleural/pulmonary scarring. No focal consolidation. No pulmonary edema.  Interval increase in trace to small volume right pleural effusion. Redemonstration of right mid to lower lung zone airspace opacity. Persistent trace right pneumothorax. No left pneumothorax. No acute osseous abnormality. IMPRESSION: 1. Interval increase in trace to small volume right pleural fluid. 2. Persistent trace right pneumothorax. 3. Right mid to lower lung zone airspace opacity again noted. Electronically Signed   By: Iven Finn M.D.   On: 05/19/2021 05:47   DG CHEST PORT 1 VIEW  Result Date: 05/18/2021 CLINICAL DATA:  Shortness of breath. EXAM: PORTABLE CHEST 1 VIEW COMPARISON:  05/17/2021. FINDINGS: Mediastinum and hilar structures normal. Right base atelectasis/infiltrate. Small right pleural effusion, improved from prior exam. Miniscule right apical pneumothorax cannot be excluded. Heart size stable. Multiple right rib fractures. Degenerative change thoracic spine. IMPRESSION: 1. Right base atelectasis/infiltrate. Small right pleural effusion, improved from prior exam. 2. Miniscule right apical pneumothorax cannot be excluded, follow-up on subsequent exams suggested. 3.  Multiple right rib fractures again noted. These results will be called to the ordering clinician or representative by the Radiologist Assistant, and communication documented in the PACS or [employer]. Electronically Signed   By: Marcello Moores  Register   On: 05/18/2021 05:24   DG CHEST PORT 1 VIEW  Result Date: 05/17/2021 CLINICAL DATA:  Shortness of breath EXAM: PORTABLE CHEST 1 VIEW COMPARISON:  05/16/2021 FINDINGS: Cardiac shadow is stable. Aortic calcifications are again seen. Left lung is clear. Right sided pleural effusion is again noted with underlying atelectasis. The overall appearance is stable from the prior study. IMPRESSION: No change in right effusion and focal infiltrate/atelectasis. Electronically Signed   By: Inez Catalina M.D.   On: 05/17/2021 11:57   DG CHEST PORT 1 VIEW  Result Date:  05/16/2021 CLINICAL DATA:  Shortness of breath EXAM: PORTABLE CHEST 1 VIEW COMPARISON:  05/15/2021 FINDINGS: Moderate right pleural effusion with right lower lobe consolidation. Left lung clear. Heart is normal size. Aorta calcifications. No acute bony abnormality. No pneumothorax. IMPRESSION: Moderate right pleural effusion with right lower lobe infiltrate. No change. Electronically Signed   By: Rolm Baptise M.D.   On: 05/16/2021 14:14   DG CHEST PORT 1 VIEW  Result Date: 05/15/2021 CLINICAL DATA:  Chest tube removed, pneumothorax EXAM: PORTABLE CHEST 1 VIEW COMPARISON:  05/15/2021 FINDINGS: Interval removal of right chest tube. No visible pneumothorax. Moderate right pleural effusion with right lower lobe airspace opacities, unchanged. Left lung clear. Heart is normal size. IMPRESSION: Interval removal of right chest tube without visible pneumothorax. Continued moderate right effusion with right lower lobe atelectasis or infiltrate. Electronically Signed   By: Rolm Baptise M.D.   On: 05/15/2021 15:52   DG CHEST PORT 1 VIEW  Result Date: 05/15/2021 CLINICAL DATA:  Pneumothorax. EXAM: PORTABLE CHEST 1 VIEW COMPARISON:  May 14, 2021 FINDINGS: The right-sided chest tube is stable. A small right apical pneumothorax is stable. Effusion with underlying opacity on the right is stable. The cardiomediastinal silhouette is unchanged. The left lung remains well aerated.  No other changes. IMPRESSION: 1. Stable right chest tube and small right apical pneumothorax. Stable right pleural effusion with underlying opacity, likely atelectasis. Electronically Signed   By: Dorise Bullion III M.D   On: 05/15/2021 08:11   DG CHEST PORT 1 VIEW  Result Date: 05/14/2021 CLINICAL DATA:  Pneumothorax EXAM: PORTABLE CHEST 1 VIEW COMPARISON:  05/13/2021 FINDINGS: Pulmonary insufflation is stable. Right-sided pigtail chest tube is unchanged in position. Tiny right apical pneumothorax has decreased in size, now nearly completely  resolved. Moderate right pleural effusion with associated right basilar compressive atelectasis and superimposed right basilar pulmonary infiltrate are unchanged. Left lung is clear. No pneumothorax or pleural effusion on the left. Multiple acute right rib fractures again noted. Cardiac size within normal limits. Pulmonary vascularity is normal. IMPRESSION: Decreasing, miniscule right apical pneumothorax, now nearly completely resolved. Right chest tube unchanged. Stable moderate right pleural effusion and associated right basilar atelectasis and infiltrate. Electronically Signed   By: Fidela Salisbury MD   On: 05/14/2021 05:15   DG CHEST PORT 1 VIEW  Result Date: 05/13/2021 CLINICAL DATA:  Pneumothorax. EXAM: PORTABLE CHEST 1 VIEW COMPARISON:  05/12/2021. FINDINGS: Mediastinum and hilar structures are normal. Heart size stable. Right chest tube in stable position. Improved right apical pneumothorax with mild residual. Persistent right base atelectasis/infiltrate and right-sided pleural effusion. No interim change. Multiple right rib fractures again noted. Right chest wall subcutaneous emphysema again noted. IMPRESSION: 1. Right chest tube in stable position. Improved right apical pneumothorax with mild residual. 2. Persistent right base atelectasis/infiltrate right-sided pleural effusion. No interim change. 3. Multiple right rib fractures again noted. Right chest wall subcutaneous emphysema again noted. Electronically Signed   By: Marcello Moores  Register   On: 05/13/2021 05:21   DG CHEST PORT 1 VIEW  Result Date: 05/12/2021 CLINICAL DATA:  Right pneumothorax EXAM: PORTABLE CHEST 1 VIEW COMPARISON:  05/11/2021 FINDINGS: Interval extubation and removal of the nasogastric tube. Pulmonary insufflation is preserved. Right mid lung zone pigtail chest tube is unchanged. Small right apical pneumothorax is unchanged. Enlarging, small to moderate right pleural effusion is present with increasing right basilar compressive  atelectasis. Left lung is clear. No pneumothorax or pleural effusion on the left. Cardiac size within normal limits. Pulmonary vascularity is normal. Multiple acute right rib fractures are again identified. Subcutaneous gas within the right chest wall appears stable. IMPRESSION: Interval extubation with preservation of pulmonary insufflation. Right chest tube in place. Stable small right apical pneumothorax. Enlarging right basilar small to moderate pleural effusion. Multiple acute right rib fractures again identified. Electronically Signed   By: Fidela Salisbury MD   On: 05/12/2021 05:10   DG Chest Port 1 View  Result Date: 05/11/2021 CLINICAL DATA:  Chest tube placement EXAM: PORTABLE CHEST 1 VIEW COMPARISON:  05/11/2021 FINDINGS: Endotracheal tube is 4.4 cm above the carina. Nasogastric tube extends into the upper abdomen beyond the margin of the examination. Pigtail chest tube overlies the right mid lung zone. Small right apical pneumothorax persists, likely stable when accounting for changes in patient positioning. Moderate subcutaneous gas noted within the right chest wall. Multiple acute right rib fractures again noted. Mild right basilar atelectasis or infiltrate persists. Left lung is clear. No pneumothorax or pleural effusion on the left. Cardiac size within normal limits. IMPRESSION: Stable support lines and tubes. Stable small right apical pneumothorax.  Right chest tube in place. Unchanged mild right basilar atelectasis or infiltrate. Multiple acute right rib fractures. Electronically Signed   By: Fidela Salisbury MD   On:  05/11/2021 06:37   DG Chest Portable 1 View  Result Date: 05/11/2021 CLINICAL DATA:  Trauma EXAM: PORTABLE CHEST 1 VIEW COMPARISON:  05/11/2021 FINDINGS: There is a pigtail catheter in the right chest. Unchanged size of right pneumothorax. Multiple right-sided rib fractures again seen. IMPRESSION: Unchanged size of right pneumothorax status post chest tube placement.  Electronically Signed   By: Ulyses Jarred M.D.   On: 05/11/2021 02:37   DG Chest Port 1 View  Result Date: 05/11/2021 CLINICAL DATA:  Level 1 trauma EXAM: PORTABLE CHEST 1 VIEW COMPARISON:  May 21, 2020 FINDINGS: The heart size and mediastinal contours are within normal limits. Multiple displaced right-sided rib fractures including the right sixth, seventh, eighth and ninth ribs. Small small right hydropneumothorax with left basilar airspace opacities. No evidence of tension. Left lung is clear. Right chest wall subcutaneous emphysema. IMPRESSION: 1. Small right hydropneumothorax without evidence of tension. Right basilar airspace opacities. 2. Multiple displaced right-sided rib fractures with associated right basilar airspace opacities. Electronically Signed   By: Dahlia Bailiff MD   On: 05/11/2021 01:07   VAS [place] LOWER EXTREMITY VENOUS (DVT) (ONLY [facility] & [facility] 7a-7p)  Result Date: 05/25/2021  Lower Venous DVT Study Patient Name:  Melanie [name]  Date of Exam:   05/25/2021 Medical Rec #: [id number]          Accession #:    [id number] Date of Birth: [birth year]           Patient Gender: F Patient Age:   79Y Exam Location:  [facility] Procedure:      VAS [place] LOWER EXTREMITY VENOUS (DVT) Referring Phys: [id number] [place] --------------------------------------------------------------------------------  Indications: Swelling.  Risk Factors: None identified. Comparison Study: No prior studies. Performing Technologist: Oliver Hum RVT  Examination Guidelines: A complete evaluation includes B-mode imaging, spectral Doppler, color Doppler, and power Doppler as needed of all accessible portions of each vessel. Bilateral testing is considered an integral part of a complete examination. Limited examinations for reoccurring indications may be performed as noted. The reflux portion of the exam is performed with the patient in reverse Trendelenburg.   +---------+---------------+---------+-----------+----------+--------------+ RIGHT    CompressibilityPhasicitySpontaneityPropertiesThrombus Aging +---------+---------------+---------+-----------+----------+--------------+ CFV      Full           Yes      Yes                                 +---------+---------------+---------+-----------+----------+--------------+ SFJ      Full                                                        +---------+---------------+---------+-----------+----------+--------------+ FV Prox  Full                                                        +---------+---------------+---------+-----------+----------+--------------+ FV Mid   Full                                                        +---------+---------------+---------+-----------+----------+--------------+  FV DistalFull                                                        +---------+---------------+---------+-----------+----------+--------------+ PFV      Full                                                        +---------+---------------+---------+-----------+----------+--------------+ POP      Full           Yes      Yes                                 +---------+---------------+---------+-----------+----------+--------------+ PTV      Full                                                        +---------+---------------+---------+-----------+----------+--------------+ PERO     Full                                                        +---------+---------------+---------+-----------+----------+--------------+ Gastroc  Partial                                      Acute          +---------+---------------+---------+-----------+----------+--------------+   +---------+---------------+---------+-----------+----------+--------------+ LEFT     CompressibilityPhasicitySpontaneityPropertiesThrombus Aging  +---------+---------------+---------+-----------+----------+--------------+ CFV      Full           Yes      Yes                                 +---------+---------------+---------+-----------+----------+--------------+ SFJ      Full                                                        +---------+---------------+---------+-----------+----------+--------------+ FV Prox  Full                                                        +---------+---------------+---------+-----------+----------+--------------+ FV Mid   Full                                                        +---------+---------------+---------+-----------+----------+--------------+  FV DistalFull                                                        +---------+---------------+---------+-----------+----------+--------------+ PFV      Full                                                        +---------+---------------+---------+-----------+----------+--------------+ POP      Full           Yes      Yes                                 +---------+---------------+---------+-----------+----------+--------------+ PTV      Full                                                        +---------+---------------+---------+-----------+----------+--------------+ PERO     Full                                                        +---------+---------------+---------+-----------+----------+--------------+     Summary: RIGHT: - Findings consistent with acute deep vein thrombosis involving the right gastrocnemius veins. - No cystic structure found in the popliteal fossa.  LEFT: - There is no evidence of deep vein thrombosis in the lower extremity.  - No cystic structure found in the popliteal fossa.  *See table(s) above for measurements and observations. Electronically signed by Deitra Mayo MD on 05/25/2021 at 6:43:30 PM.    Final    ECHOCARDIOGRAM LIMITED  Result Date: 05/18/2021     ECHOCARDIOGRAM LIMITED REPORT   Patient Name:   Melanie [name] Date of Exam: 05/18/2021 Medical Rec #:  [id number]         Height:       68.0 in Accession #:    [id number]        Weight:       135.0 lb Date of Birth:  1941-10-03          BSA:          1.729 m Patient Age:    53 years          BP:           105/47 mmHg Patient Gender: F                 HR:           83 bpm. Exam Location:  Inpatient Procedure: Limited Echo Indications:     Atrial Fibrillation I48.91  History:         Patient has no prior history of Echocardiogram examinations.                  Risk Factors:Hypertension.  Sonographer:     Darlina Sicilian RDCS  Referring Phys:  [place] Diagnosing Phys: Adrian Prows MD  Sonographer Comments: Exam ended per patients request, the exam was causing too much pain. IMPRESSIONS  1. Grossly preserved LVEF by image 16. . Left ventricular endocardial border not optimally defined to evaluate regional wall motion. Left ventricular diastolic function could not be evaluated.  2. Right ventricular systolic function was not well visualized.  3. Grossly normal sized.  4. Grossly no pericardial efffusion.  5. The mitral valve was not well visualized. No evidence of mitral valve regurgitation.  6. The aortic valve was not well visualized. Conclusion(s)/Recommendation(s): Poor echo window, patient with flail chest and hydropneumothorax involving the right and difficulty image. Grossly left ventricular systolic function appears preserved and the left atrium appears grossly normal sized. FINDINGS  Left Ventricle: Grossly preserved LVEF by image 16. Left ventricular endocardial border not optimally defined to evaluate regional wall motion. Left ventricular diastolic function could not be evaluated. Right Ventricle: Right ventricular systolic function was not well visualized. Left Atrium: Grossly normal sized. Left atrial size was not well visualized. Right Atrium: Right atrial size was not well visualized. Pericardium:  Grossly no pericardial efffusion. The pericardium was not well visualized. Mitral Valve: The mitral valve was not well visualized. Tricuspid Valve: The tricuspid valve is not well visualized. Aortic Valve: The aortic valve was not well visualized. Pulmonic Valve: The pulmonic valve was not assessed. Aorta: The aortic root was not well visualized. Venous: The inferior vena cava was not well visualized. LEFT VENTRICLE PLAX 2D LVOT diam:     1.90 cm LVOT Area:     2.84 cm   AORTA Ao Root diam: 3.40 cm  SHUNTS Systemic Diam: 1.90 cm Adrian Prows MD Electronically signed by Adrian Prows MD Signature Date/Time: 05/18/2021/9:20:31 PM    Final    IR THORACENTESIS ASP PLEURAL SPACE W/IMG GUIDE  Result Date: 05/17/2021 INDICATION: Patient with right pleural effusion status post fall. Request is made for right therapeutic thoracentesis. EXAM: ULTRASOUND GUIDED THERAPEUTIC RIGHT THORACENTESIS MEDICATIONS: 10 ML 1% LIDOCAINE COMPLICATIONS: None immediate. PROCEDURE: An ultrasound guided thoracentesis was thoroughly discussed with the patient and questions answered. The benefits, risks, alternatives and complications were also discussed. The patient understands and wishes to proceed with the procedure. Written consent was obtained. Ultrasound was performed to localize and mark an adequate pocket of fluid in the right chest. The area was then prepped and draped in the normal sterile fashion. 1% Lidocaine was used for local anesthesia. Under ultrasound guidance a 6 Fr Safe-T-Centesis catheter was introduced. Thoracentesis was performed. The catheter was removed and a dressing applied. FINDINGS: A total of approximately 900 mL of bloody fluid was removed. IMPRESSION: Successful ultrasound guided therapeutic right thoracentesis yielding 900 mL of pleural fluid. Read by: Brynda Greathouse PA-C Electronically Signed   By: Ruthann Cancer MD   On: 05/17/2021 15:48    Labs Reviewed  BASIC METABOLIC PANEL - Abnormal; Notable for the  following components:      Result Value   Sodium 134 (*)    Chloride 94 (*)    CO2 33 (*)    Glucose, Bld 111 (*)    All other components within normal limits  CBC WITH DIFFERENTIAL/PLATELET - Abnormal; Notable for the following components:   RBC 3.63 (*)    Hemoglobin 11.1 (*)    HCT 34.5 (*)    Platelets 403 (*)    Lymphs Abs 0.5 (*)    All other components within normal limits  BRAIN NATRIURETIC PEPTIDE -  Abnormal; Notable for the following components:   B Natriuretic Peptide 480.6 (*)    All other components within normal limits  HEPATIC FUNCTION PANEL - Abnormal; Notable for the following components:   Albumin 3.2 (*)    All other components within normal limits  RESP PANEL BY RT-PCR (FLU A&B, COVID) [id number]  LACTIC ACID, PLASMA  COMPREHENSIVE METABOLIC PANEL  CBC  HEPARIN LEVEL (UNFRACTIONATED)  MAGNESIUM  PHOSPHORUS  HEPARIN LEVEL (UNFRACTIONATED)     DVT study is positive.  PE study is ordered with out PE.   Recommendation from surgery PA was to defer anticoagulation to medicine, ok to treat rate control.  They requested medicine admit to [facility].   I spoke with hospitalist, who was questioning if trauma should be admitting her.  Hospitalist will speak with surgery to determine admission.   Note: Portions of this report may have been transcribed using voice recognition software. Every effort was made to ensure accuracy; however, inadvertent computerized transcription errors may be present      Lorin Glass, PA-C 05/26/21 [place], Alvin Critchley, DO 05/26/21 [id number]

## 2021-05-25 NOTE — ED Notes (Signed)
[facility] called to transport pt to [facility]

## 2021-05-25 NOTE — ED Notes (Signed)
PA at bedside.

## 2021-05-25 NOTE — Discharge Instructions (Addendum)
-  Head straight to [facility] emergency department for further evaluation and management of symptoms. -If symptoms get worse while on the way like shortness of breath, chest pain, dizziness- stop and call 911.

## 2021-05-26 ENCOUNTER — Inpatient Hospital Stay (HOSPITAL_COMMUNITY): Payer: Medicare Other

## 2021-05-26 ENCOUNTER — Other Ambulatory Visit (HOSPITAL_COMMUNITY): Payer: Self-pay

## 2021-05-26 DIAGNOSIS — F419 Anxiety disorder, unspecified: Secondary | ICD-10-CM

## 2021-05-26 DIAGNOSIS — K219 Gastro-esophageal reflux disease without esophagitis: Secondary | ICD-10-CM

## 2021-05-26 DIAGNOSIS — J948 Other specified pleural conditions: Secondary | ICD-10-CM | POA: Diagnosis present

## 2021-05-26 LAB — COMPREHENSIVE METABOLIC PANEL
ALT: 19 U/L (ref 0–44)
AST: 20 U/L (ref 15–41)
Albumin: 3.1 g/dL — ABNORMAL LOW (ref 3.5–5.0)
Alkaline Phosphatase: 52 U/L (ref 38–126)
Anion gap: 10 (ref 5–15)
BUN: 19 mg/dL (ref 8–23)
CO2: 34 mmol/L — ABNORMAL HIGH (ref 22–32)
Calcium: 9.5 mg/dL (ref 8.9–10.3)
Chloride: 90 mmol/L — ABNORMAL LOW (ref 98–111)
Creatinine, Ser: 0.66 mg/dL (ref 0.44–1.00)
GFR, Estimated: >60 mL/min (ref >60)
Glucose, Bld: 122 mg/dL — ABNORMAL HIGH (ref 70–99)
Potassium: 4.7 mmol/L (ref 3.5–5.1)
Sodium: 134 mmol/L — ABNORMAL LOW (ref 135–145)
Total Bilirubin: 0.4 mg/dL (ref 0.3–1.2)
Total Protein: 6.4 g/dL — ABNORMAL LOW (ref 6.5–8.1)

## 2021-05-26 LAB — CBC
HCT: 33.9 % — ABNORMAL LOW (ref 36.0–46.0)
Hemoglobin: 10.9 g/dL — ABNORMAL LOW (ref 12.0–15.0)
MCH: 30.4 pg (ref 26.0–34.0)
MCHC: 32.2 g/dL (ref 30.0–36.0)
MCV: 94.4 fL (ref 80.0–100.0)
Platelets: 453 10*3/uL — ABNORMAL HIGH (ref 150–400)
RBC: 3.59 MIL/uL — ABNORMAL LOW (ref 3.87–5.11)
RDW: 13 % (ref 11.5–15.5)
WBC: 11.3 10*3/uL — ABNORMAL HIGH (ref 4.0–10.5)
nRBC: 0 % (ref 0.0–0.2)

## 2021-05-26 LAB — MAGNESIUM: Magnesium: 2 mg/dL (ref 1.7–2.4)

## 2021-05-26 LAB — HEPARIN LEVEL (UNFRACTIONATED): Heparin Unfractionated: 0.33 IU/mL (ref 0.30–0.70)

## 2021-05-26 LAB — PHOSPHORUS: Phosphorus: 3.9 mg/dL (ref 2.5–4.6)

## 2021-05-26 MED ORDER — DILTIAZEM HCL 60 MG PO TABS
30.0000 mg | ORAL_TABLET | Freq: Four times a day (QID) | ORAL | Status: DC
  Administered 2021-05-26 – 2021-05-30 (×15): 30 mg via ORAL
  Filled 2021-05-26 (×15): qty 1

## 2021-05-26 MED ORDER — MIDAZOLAM HCL 2 MG/2ML IJ SOLN
INTRAMUSCULAR | Status: AC | PRN
  Administered 2021-05-26: 0.5 mg via INTRAVENOUS

## 2021-05-26 MED ORDER — LIDOCAINE HCL 1 % IJ SOLN
INTRAMUSCULAR | Status: AC
  Filled 2021-05-26: qty 20

## 2021-05-26 MED ORDER — FENTANYL CITRATE (PF) 100 MCG/2ML IJ SOLN
INTRAMUSCULAR | Status: AC
  Filled 2021-05-26: qty 2

## 2021-05-26 MED ORDER — MIDAZOLAM HCL 2 MG/2ML IJ SOLN
INTRAMUSCULAR | Status: AC
  Filled 2021-05-26: qty 2

## 2021-05-26 MED ORDER — ENSURE ENLIVE PO LIQD
237.0000 mL | Freq: Three times a day (TID) | ORAL | Status: DC
  Administered 2021-05-26 – 2021-06-08 (×32): 237 mL via ORAL
  Filled 2021-05-26 (×4): qty 237

## 2021-05-26 MED ORDER — HEPARIN (PORCINE) 25000 UT/250ML-% IV SOLN
1200.0000 [IU]/h | INTRAVENOUS | Status: DC
  Administered 2021-05-26 – 2021-05-27 (×2): 1100 [IU]/h via INTRAVENOUS
  Administered 2021-05-28 (×2): 1200 [IU]/h via INTRAVENOUS
  Filled 2021-05-26 (×3): qty 250

## 2021-05-26 MED ORDER — FENTANYL CITRATE (PF) 100 MCG/2ML IJ SOLN
INTRAMUSCULAR | Status: AC | PRN
  Administered 2021-05-26: 25 ug via INTRAVENOUS
  Administered 2021-05-26: 12.5 ug via INTRAVENOUS

## 2021-05-26 NOTE — Consult Note (Signed)
CARDIOLOGY CONSULT NOTE  Patient ID: Melanie [name] MRN: [id number] DOB/AGE: 03/10/1941 80 y.o.  Admit date: 05/25/2021 Referring Physician  Hosie Poisson, MD Primary Physician:  [place], Betty G, MD Reason for Consultation  Atrial fibrillation with RVR  Patient ID: Melanie [name], female    DOB: 03/31/1941, 80 y.o.   MRN: [id number]  Chief Complaint  Patient presents with  . Leg Swelling   HPI:    Melanie [name]  is a 80 y.o. caucasian female with history of hypertension, COPD, multinodular goiter. Recently admitted 5/23 with traumatic hemopneumothorax and new onset atrial fibrillation with RVR. Patient was previously discharged on metoprolol and diltiazem, without anticoagulation given hemothorax. Patient was discharged, and now presents back to [facility] with primary complaint of bilateral leg swelling and shortness of breath. She is on supplemental oxygen at baseline at home.   Evaluation in the ED revealed moderate right hydropneumothorax with loculated effusion, right leg DVT, and atrial fibrillation with RVR. CT angio of the chest negative for PE. Patient has been started on IV heparin given atrial fibrillation and DVT, despite bleeding risk due to hemothorax. Patient is presently being evaluated by surgery and IR for thoracentesis vs. chest tube placement.   In regard to atrial fibrillation patient was started on diltiazem drip and continued lopressor 50 mg PO BID for rate control strategy. Patient is seen and examined this morning, sitting up in bedside chair. She remains on diltiazem infusion, rate is well controlled. Patient denies chest pain, palpitations, dizziness, syncope, near-syncope. She continues to complain of shortness of breath, presently on 4L oxygen via nasal canula.   Notably prior to fall 2 weeks ago patient was relatively healthy and active, she lived independently. Patient is now staying with her son.   Past Medical History:  Diagnosis Date  . Anxiety   .  Cancer ([place])    rectal ca  . COPD (chronic obstructive pulmonary disease) ([place])   . Thyroid disease    Past Surgical History:  Procedure Laterality Date  . ABDOMINAL HYSTERECTOMY    . COLON SURGERY    . IR THORACENTESIS ASP PLEURAL SPACE W/IMG GUIDE  05/17/2021   Family History  Problem Relation Age of Onset  . Cancer Sister        Lung cancer   Social History   Tobacco Use  . Smoking status: Former Smoker    Types: Cigarettes  . Smokeless tobacco: Never Used  Substance Use Topics  . Alcohol use: No    Marital Sttus: Married  ROS  Review of Systems  Constitutional: Negative for malaise/fatigue and weight gain.  Cardiovascular: Negative for chest pain, claudication, leg swelling, near-syncope, orthopnea, palpitations, paroxysmal nocturnal dyspnea and syncope.  Respiratory: Positive for shortness of breath.   Hematologic/Lymphatic: Does not bruise/bleed easily.  Gastrointestinal: Negative for melena.  Neurological: Negative for dizziness and weakness.  All other systems reviewed and are negative.  Objective   Vitals with BMI 05/26/2021 05/26/2021 05/26/2021  Height - - -  Weight - - -  BMI - - -  Systolic 433 295 188  Diastolic 69 75 94  Pulse - 130 -    Blood pressure 140/69, pulse (!) 130, temperature 97.7 F (36.5 C), temperature source Oral, resp. rate (!) 22, height 5\' 8"  (1.727 m), weight 61.2 kg, SpO2 95 %.    Physical Exam Vitals and nursing note reviewed.  Constitutional:      General: She is not in acute distress.    Comments: Difficulty hearing  HENT:     Head: Normocephalic and atraumatic.     Right Ear: External ear normal.     Left Ear: External ear normal.     Nose: Nose normal.     Mouth/Throat:     Mouth: Mucous membranes are moist.     Pharynx: No posterior oropharyngeal erythema.  Eyes:     Extraocular Movements: Extraocular movements intact.     Conjunctiva/sclera: Conjunctivae normal.  Cardiovascular:     Rate and Rhythm: Normal rate.  Rhythm irregularly irregular.     Pulses: Intact distal pulses.     Heart sounds: S1 normal and S2 normal. No murmur heard. No gallop.   Pulmonary:     Effort: Pulmonary effort is normal. No respiratory distress.     Breath sounds: Rhonchi (bilateral) present.  Abdominal:     General: Bowel sounds are normal.     Palpations: Abdomen is soft.     Tenderness: There is no abdominal tenderness.  Musculoskeletal:     Right lower leg: No edema.     Left lower leg: No edema.  Skin:    General: Skin is warm and dry.  Neurological:     General: No focal deficit present.     Mental Status: She is alert and oriented to person, place, and time.     Comments: Patient appears mildly confused, particularly regarding outpatient medications, unsure what she was taking    Laboratory examination:    Recent Labs    09/09/20 1222 03/30/21 1301 05/18/21 0553 05/25/21 1356 05/26/21 0330  NA 136   < > 135 134* 134*  K 4.0   < > 4.5 4.6 4.7  CL 99   < > 93* 94* 90*  CO2 32   < > 35* 33* 34*  GLUCOSE 88   < > 106* 111* 122*  BUN 18   < > 19 19 19   CREATININE 0.87   < > 0.77 0.72 0.66  CALCIUM 9.9   < > 9.0 9.5 9.5  GFRNONAA 62   < > >60 >60 >60  GFRAA 72  --   --   --   --    < > = values in this interval not displayed.   estimated creatinine clearance is 53.3 mL/min (by C-G formula based on SCr of 0.66 mg/dL).  CMP Latest Ref Rng & Units 05/26/2021 05/25/2021 05/18/2021  Glucose 70 - 99 mg/dL 122(H) 111(H) 106(H)  BUN 8 - 23 mg/dL 19 19 19   Creatinine 0.44 - 1.00 mg/dL 0.66 0.72 0.77  Sodium 135 - 145 mmol/L 134(L) 134(L) 135  Potassium 3.5 - 5.1 mmol/L 4.7 4.6 4.5  Chloride 98 - 111 mmol/L 90(L) 94(L) 93(L)  CO2 22 - 32 mmol/L 34(H) 33(H) 35(H)  Calcium 8.9 - 10.3 mg/dL 9.5 9.5 9.0  Total Protein 6.5 - 8.1 g/dL 6.4(L) 6.5 -  Total Bilirubin 0.3 - 1.2 mg/dL 0.4 0.6 -  Alkaline Phos 38 - 126 U/L 52 53 -  AST 15 - 41 U/L 20 26 -  ALT 0 - 44 U/L 19 14 -   CBC Latest Ref Rng & Units  05/26/2021 05/25/2021 05/18/2021  WBC 4.0 - 10.5 K/uL 11.3(H) 8.8 8.4  Hemoglobin 12.0 - 15.0 g/dL 10.9(L) 11.1(L) 10.6(L)  Hematocrit 36.0 - 46.0 % 33.9(L) 34.5(L) 32.2(L)  Platelets 150 - 400 K/uL 453(H) 403(H) 392   Lipid Panel No results for input(s): CHOL, TRIG, LDLCALC, VLDL, HDL, CHOLHDL, LDLDIRECT in the last 8760 hours.  HEMOGLOBIN A1C  No results found for: HGBA1C, MPG TSH Recent Labs    03/30/21 1301 05/18/21 1322  TSH 0.57 0.726   BNP (last 3 results) Recent Labs    05/25/21 1358  BNP 480.6*   Results for orders placed or performed during the hospital encounter of 05/25/21 (from the past 48 hour(s))  Resp Panel by RT-PCR (Flu A&B, Covid) Nasopharyngeal Swab     Status: None   Collection Time: 05/25/21  1:21 PM   Specimen: Nasopharyngeal Swab; Nasopharyngeal(NP) swabs in vial transport medium  Result Value Ref Range   SARS Coronavirus 2 by RT PCR NEGATIVE NEGATIVE    Comment: (NOTE) SARS-CoV-2 target nucleic acids are NOT DETECTED.  The SARS-CoV-2 RNA is generally detectable in upper respiratory specimens during the acute phase of infection. The lowest concentration of SARS-CoV-2 viral copies this assay can detect is 138 copies/mL. A negative result does not preclude SARS-Cov-2 infection and should not be used as the sole basis for treatment or other patient management decisions. A negative result may occur with  improper specimen collection/handling, submission of specimen other than nasopharyngeal swab, presence of viral mutation(s) within the areas targeted by this assay, and inadequate number of viral copies(<138 copies/mL). A negative result must be combined with clinical observations, patient history, and epidemiological information. The expected result is Negative.  Fact Sheet for Patients:  EntrepreneurPulse.com.au  Fact Sheet for Healthcare Providers:  IncredibleEmployment.be  This test is no t yet approved or  cleared by the [place] FDA and  has been authorized for detection and/or diagnosis of SARS-CoV-2 by FDA under an Emergency Use Authorization (EUA). This EUA will remain  in effect (meaning this test can be used) for the duration of the COVID-19 declaration under Section 564(b)(1) of the Act, 21 U.S.C.section 360bbb-3(b)(1), unless the authorization is terminated  or revoked sooner.       Influenza A by PCR NEGATIVE NEGATIVE   Influenza B by PCR NEGATIVE NEGATIVE    Comment: (NOTE) The Xpert Xpress SARS-CoV-2/FLU/RSV plus assay is intended as an aid in the diagnosis of influenza from Nasopharyngeal swab specimens and should not be used as a sole basis for treatment. Nasal washings and aspirates are unacceptable for Xpert Xpress SARS-CoV-2/FLU/RSV testing.  Fact Sheet for Patients: EntrepreneurPulse.com.au  Fact Sheet for Healthcare Providers: IncredibleEmployment.be  This test is not yet approved or cleared by the [place] FDA and has been authorized for detection and/or diagnosis of SARS-CoV-2 by FDA under an Emergency Use Authorization (EUA). This EUA will remain in effect (meaning this test can be used) for the duration of the COVID-19 declaration under Section 564(b)(1) of the Act, 21 U.S.C. section 360bbb-3(b)(1), unless the authorization is terminated or revoked.  Performed at [facility], [place] [address], [place], [place] [address]   Basic metabolic panel     Status: Abnormal   Collection Time: 05/25/21  1:56 PM  Result Value Ref Range   Sodium 134 (L) 135 - 145 mmol/L   Potassium 4.6 3.5 - 5.1 mmol/L   Chloride 94 (L) 98 - 111 mmol/L   CO2 33 (H) 22 - 32 mmol/L   Glucose, Bld 111 (H) 70 - 99 mg/dL    Comment: Glucose reference range applies only to samples taken after fasting for at least 8 hours.   BUN 19 8 - 23 mg/dL   Creatinine, Ser 0.72 0.44 - 1.00 mg/dL   Calcium 9.5 8.9 - 10.3 mg/dL   GFR,  Estimated >60 >60 mL/min  Comment: (NOTE) Calculated using the CKD-EPI Creatinine Equation (2021)    Anion gap 7 5 - 15    Comment: Performed at [facility], [place] [address], [place], [place] [address]  CBC with Differential     Status: Abnormal   Collection Time: 05/25/21  1:56 PM  Result Value Ref Range   WBC 8.8 4.0 - 10.5 K/uL   RBC 3.63 (L) 3.87 - 5.11 MIL/uL   Hemoglobin 11.1 (L) 12.0 - 15.0 g/dL   HCT 34.5 (L) 36.0 - 46.0 %   MCV 95.0 80.0 - 100.0 fL   MCH 30.6 26.0 - 34.0 pg   MCHC 32.2 30.0 - 36.0 g/dL   RDW 13.0 11.5 - 15.5 %   Platelets 403 (H) 150 - 400 K/uL   nRBC 0.0 0.0 - 0.2 %   Neutrophils Relative % 85 %   Neutro Abs 7.5 1.7 - 7.7 K/uL   Lymphocytes Relative 6 %   Lymphs Abs 0.5 (L) 0.7 - 4.0 K/uL   Monocytes Relative 7 %   Monocytes Absolute 0.6 0.1 - 1.0 K/uL   Eosinophils Relative 2 %   Eosinophils Absolute 0.2 0.0 - 0.5 K/uL   Basophils Relative 0 %   Basophils Absolute 0.0 0.0 - 0.1 K/uL   Immature Granulocytes 0 %   Abs Immature Granulocytes 0.02 0.00 - 0.07 K/uL    Comment: Performed at [facility], [place] [address], [place], [place] [address]  Brain natriuretic peptide     Status: Abnormal   Collection Time: 05/25/21  1:58 PM  Result Value Ref Range   B Natriuretic Peptide 480.6 (H) 0.0 - 100.0 pg/mL    Comment: Performed at [facility], [place] [address] [place], [place] [address]  Hepatic function panel     Status: Abnormal   Collection Time: 05/25/21  1:58 PM  Result Value Ref Range   Total Protein 6.5 6.5 - 8.1 g/dL   Albumin 3.2 (L) 3.5 - 5.0 g/dL   AST 26 15 - 41 U/L   ALT 14 0 - 44 U/L   Alkaline Phosphatase 53 38 - 126 U/L   Total Bilirubin 0.6 0.3 - 1.2 mg/dL   Bilirubin, Direct 0.2 0.0 - 0.2 mg/dL   Indirect Bilirubin 0.4 0.3 - 0.9 mg/dL    Comment: Performed at [facility], [place] [address] [place], [place] [address]  Lactic acid, plasma     Status: None    Collection Time: 05/25/21  2:00 PM  Result Value Ref Range   Lactic Acid, Venous 0.8 0.5 - 1.9 mmol/L    Comment: Performed at [facility], [place] [address] [place], [place] [address]  Comprehensive metabolic panel     Status: Abnormal   Collection Time: 05/26/21  3:30 AM  Result Value Ref Range   Sodium 134 (L) 135 - 145 mmol/L   Potassium 4.7 3.5 - 5.1 mmol/L   Chloride 90 (L) 98 - 111 mmol/L   CO2 34 (H) 22 - 32 mmol/L   Glucose, Bld 122 (H) 70 - 99 mg/dL    Comment: Glucose reference range applies only to samples taken after fasting for at least 8 hours.   BUN 19 8 - 23 mg/dL   Creatinine, Ser 0.66 0.44 - 1.00 mg/dL   Calcium 9.5 8.9 - 10.3 mg/dL   Total Protein 6.4 (L) 6.5 - 8.1 g/dL   Albumin 3.1 (L) 3.5 - 5.0 g/dL   AST 20 15 -  41 U/L   ALT 19 0 - 44 U/L   Alkaline Phosphatase 52 38 - 126 U/L   Total Bilirubin 0.4 0.3 - 1.2 mg/dL   GFR, Estimated >60 >60 mL/min    Comment: (NOTE) Calculated using the CKD-EPI Creatinine Equation (2021)    Anion gap 10 5 - 15    Comment: Performed at [place] [address], [place], [place] [address]  CBC     Status: Abnormal   Collection Time: 05/26/21  3:30 AM  Result Value Ref Range   WBC 11.3 (H) 4.0 - 10.5 K/uL   RBC 3.59 (L) 3.87 - 5.11 MIL/uL   Hemoglobin 10.9 (L) 12.0 - 15.0 g/dL   HCT 33.9 (L) 36.0 - 46.0 %   MCV 94.4 80.0 - 100.0 fL   MCH 30.4 26.0 - 34.0 pg   MCHC 32.2 30.0 - 36.0 g/dL   RDW 13.0 11.5 - 15.5 %   Platelets 453 (H) 150 - 400 K/uL   nRBC 0.0 0.0 - 0.2 %    Comment: Performed at [facility], [place] [address] [place], [place] [address]  Magnesium     Status: None   Collection Time: 05/26/21  3:30 AM  Result Value Ref Range   Magnesium 2.0 1.7 - 2.4 mg/dL    Comment: Performed at [place] [address], [place], [place] [address]  Phosphorus     Status: None   Collection Time: 05/26/21  3:30 AM  Result Value Ref Range   Phosphorus 3.9 2.5 - 4.6 mg/dL     Comment: Performed at [place] [address], [place], [place] [address]  Heparin level (unfractionated)     Status: None   Collection Time: 05/26/21  3:30 AM  Result Value Ref Range   Heparin Unfractionated 0.33 0.30 - 0.70 IU/mL    Comment: (NOTE) The clinical reportable range upper limit is being lowered to >1.10 to align with the FDA approved guidance for the current laboratory assay.  If heparin results are below expected values, and patient dosage has  been confirmed, suggest follow up testing of antithrombin III levels. Performed at [facility], [place] [address] [place], [place] [address]     Medications and allergies   Allergies  Allergen Reactions  . Latex Rash  . Macrodantin Shortness Of Breath  . Guaifenesin Er Itching  . Morphine And Related Nausea Only  . Ditropan [Oxybutynin]     Causes diarrhea - contains Magnesium  . Lipitor [Atorvastatin]     rash  . Lisinopril     Diarrhea/cough  . Magnesium-Containing Compounds     Causes diarrhea  . Pravastatin     Leg cramps  . Zoloft [Sertraline Hcl]     Diarrhea     Current Meds  Medication Sig  . acetaminophen (TYLENOL) 650 MG CR tablet Take 650 mg by mouth every 8 (eight) hours as needed for fever or pain.  Marland Kitchen CALCIUM-VITAMIN D PO Take 1 tablet by mouth daily.  . cetirizine (ZYRTEC) 10 MG tablet Take 0.5 tablets (5 mg total) by mouth daily. (Patient taking differently: Take 10 mg by mouth daily.)  . diltiazem (CARDIZEM) 30 MG tablet Take 1 tablet (30 mg total) by mouth every 6 (six) hours.  . docusate sodium (COLACE) 100 MG capsule Take 1 capsule (100 mg total) by mouth 2 (two) times daily as needed for mild constipation.  . fluticasone (FLONASE) 50 MCG/ACT nasal spray Place  1 spray into both nostrils 2 (two) times daily. (Patient taking differently: Place 1 spray into both nostrils 2 (two) times daily as needed for allergies.)  . hydrocortisone cream 1 % Apply 1 application topically 4 (four)  times daily as needed for itching.  Marland Kitchen LORazepam (ATIVAN) 0.5 MG tablet Take 0.5 mg by mouth 2 (two) times daily as needed for anxiety.  . metoprolol tartrate (LOPRESSOR) 50 MG tablet Take 1 tablet (50 mg total) by mouth 2 (two) times daily.  Marland Kitchen oxyCODONE (OXY IR/ROXICODONE) 5 MG immediate release tablet Take 1 tablet (5 mg total) by mouth every 6 (six) hours as needed for breakthrough pain.  . pantoprazole (PROTONIX) 20 MG tablet Take 20 mg by mouth daily.  . polyvinyl alcohol (LIQUIFILM TEARS) 1.4 % ophthalmic solution Place 1 drop into both eyes as needed for dry eyes.  Marland Kitchen VITAMIN D PO Take 1 capsule by mouth daily.    Scheduled Meds: . acetaminophen  1,000 mg Oral Q8H  . losartan  100 mg Oral Daily  . metoprolol tartrate  50 mg Oral BID  .  morphine injection  2 mg Intravenous Once  . ondansetron (ZOFRAN) IV  4 mg Intravenous Once  . pantoprazole  40 mg Oral Daily   Continuous Infusions: . sodium chloride 50 mL/hr at 05/26/21 0445  . diltiazem (CARDIZEM) infusion 5 mg/hr (05/26/21 0445)  . heparin 1,100 Units/hr (05/26/21 0521)   PRN Meds:.docusate sodium, LORazepam, ondansetron **OR** ondansetron (ZOFRAN) IV, oxyCODONE   I/O last 3 completed shifts: In: 634.6 [I.V.:634.6] Out: -  No intake/output data recorded.    Radiology:   DG Chest 2 View 05/25/2021  FINDINGS: There is a fairly small right apical pneumothorax without tension component. There are multiple displaced rib fractures on the right. There is a moderate pleural effusion on the right with atelectasis and potential consolidation in portions of the right middle and lower lobes. Left lung clear. Heart size and pulmonary vascularity are normal. No adenopathy. There is degenerative change in the thoracic spine. IMPRESSION: Multiple displaced rib fractures on the right with right pleural effusion and fairly small right apical pneumothorax. No tension component. Atelectasis with potential consolidation in portions of the right  middle and lower lobes. Left lung clear. Heart size normal. Aortic Atherosclerosis (ICD10-I70.0).   CT Angio Chest PE W/Cm &/Or Wo Cm 05/25/2021 IMPRESSION: No definite evidence of pulmonary embolus. Moderate right hydropneumothorax is noted with loculated effusion seen in the right lower lobe and associated atelectasis of the right lower lobe. Stable appearance of right segmental rib fractures and flail chest is noted as described on prior exam. Aortic Atherosclerosis   VAS [place] LOWER EXTREMITY VENOUS DVT 05/25/2021 RIGHT: - Findings consistent with acute deep vein thrombosis involving the right gastrocnemius veins. - No cystic structure found in the popliteal fossa.   LEFT: - There is no evidence of deep vein thrombosis in the lower extremity.  - No cystic structure found in the popliteal fossa.   Cardiac Studies:   Echocardiogram 05/18/2021:  1. Grossly preserved LVEF by image 16. . Left ventricular endocardial  border not optimally defined to evaluate regional wall motion. Left  ventricular diastolic function could not be evaluated.  2. Right ventricular systolic function was not well visualized.  3. Grossly normal sized.  4. Grossly no pericardial efffusion.  5. The mitral valve was not well visualized. No evidence of mitral valve  regurgitation.  6. The aortic valve was not well visualized.  Conclusion(s)/Recommendation(s): Poor echo window, patient with  flail  chest and hydropneumothorax involving the right and difficulty image.  Grossly left ventricular systolic function appears preserved and the left  atrium appears grossly normal sized.   Telemetry:  Atrial fibrillation with relatively controlled ventricular response, approximately 95-110 bpm at time of exam  EKG:  05/25/2021: Atrial fibrillation with rapid ventricular response at a rate of 110 bpm.  No evidence of ischemia or underlying injury pattern.  Assessment   Maat Kafer Hoecker  is a 80 y.o. caucasian female with  history of hypertension, COPD, multinodular goiter. Recently admitted 5/23 with traumatic hemopneumothorax and new onset atrial fibrillation with RVR. Now presents back to [facility] with primary complaint of bilateral leg swelling and shortness of breath. Workup revealed atrial fibrillation with rapid ventricular response, right leg DVT, and hydropneumothorax.  Atrial fibrillation with rapid ventricular response  Right leg DVT  Primary hypertension  Hydropneumothorax     Recommendations:   Atrial fibrillation with rapid ventricular response This patients CHA2DS2-VASc Score 3 (age, HTN) and yearly risk of stroke 3.2%.  Patient with newly diagnosed atrial fibrillation during last admission on 05/17/2021, patient was recommended rate control strategy and anticoagulation was held given high risk of bleeding in setting of traumatic hemothorax.  She now presents back to [facility] with atrial fibrillation with ventricular response.  Suspect underlying hydropneumothorax to be etiology of atrial fibrillation.  In this acute setting we will pursue rate control strategy, continue diltiazem infusion and Lopressor 50 mg p.o. twice daily.  In regard to anticoagulation, patient is presently on heparin given atrial fibrillation with CHA2DS2-VASc score of 3 and with newly diagnosed right leg DVT.  Heparin can certainly be held for any procedures necessary from surgery team perspective in order to manage hydropneumothorax.  Decision regarding long-term oral anticoagulation will need to be shared decision including assessment of bleeding risk.  Briefly discussed with patient's daughter who was present at bedside regarding risks versus benefits of long-term oral anticoagulation.  Also encouraged patient and her daughter to discuss with family regarding patient's goals of care point forward.   Right leg DVT  Agree with continuing heparin, which can be held as necessary for procedures by surgical or IR team. Will  defer further management to primary team.  Primary hypertension  Continue Lopressor and losartan.  Blood pressure is relatively well controlled at this time.  Hydropneumothorax  Will defer further management to surgical and interventional radiology team. Suspect hydropneumothorax to be underlying trigger for atrial fibrillation with RVR.  Will continue to follow.   Patient was seen in collaboration with Dr. Virgina Jock. He also reviewed patient's chart and examined the patient. Dr. Virgina Jock is in agreement of the plan.     Melanie Berthold, PA-C 05/26/2021, 9:35 AM Office: [phone]

## 2021-05-26 NOTE — Progress Notes (Signed)
ANTICOAGULATION CONSULT NOTE - Follow Up Consult  Pharmacy Consult for heparin Indication: DVT/Afib  Labs: Recent Labs    05/25/21 1356 05/26/21 0330  HGB 11.1* 10.9*  HCT 34.5* 33.9*  PLT 403* 453*  HEPARINUNFRC  --  0.33  CREATININE 0.72 0.66    Assessment: 79yo female therapeutic on heparin but at very low end of goal, would prefer higher level w/ acute DVT and Afib; no gtt issues or signs of bleeding per RN.  Goal of Therapy:  Heparin level 0.3-0.7 units/ml   Plan:  Will increase heparin gtt by 10% to 1100 units/hr and check level in 8 hours.    Wynona Neat, PharmD, BCPS  05/26/2021,5:11 AM

## 2021-05-26 NOTE — Progress Notes (Signed)
Initial Nutrition Assessment  DOCUMENTATION CODES:   Non-severe (moderate) malnutrition in context of chronic illness  INTERVENTION:    Ensure Enlive po TID, each supplement provides 350 kcal and 20 grams of protein  MVI with minerals daily  NUTRITION DIAGNOSIS:   Moderate Malnutrition related to chronic illness (COPD) as evidenced by moderate fat depletion,mild muscle depletion,severe muscle depletion.  GOAL:   Patient will meet greater than or equal to 90% of their needs  MONITOR:   PO intake,Supplement acceptance  REASON FOR ASSESSMENT:   Malnutrition Screening Tool    ASSESSMENT:   80 yo female admitted with bilateral leg swelling, R leg DVT, R hydropneumothorax. PMH includes rectal cancer, multinodular goiter, HTN, A fib with RVR, COPD, anxiety.   S/P placement of R side chest tube this morning. Patient sleepy during RD visit. She was unable to provide reliable nutrition history.  Diet advanced to regular after procedure this morning.  Meal intakes not recorded.  Suspect intake will be poor given lethargy. Suspect intake has been poor for a long time, given moderate-severe depletion of muscle and subcutaneous fat mass. Meets criteria for moderate malnutrition.  RD used bed scale to obtain current weight of 59.9 kg. She weighed 61.2 kg on 05/11/21. 2% weight loss within the past 2 weeks.  Labs reviewed. Na 134 (L) Medications reviewed and include Protonix.  NUTRITION - FOCUSED PHYSICAL EXAM:  Flowsheet Row Most Recent Value  Orbital Region Moderate depletion  Upper Arm Region Moderate depletion  Thoracic and Lumbar Region Unable to assess  Buccal Region Moderate depletion  Temple Region Severe depletion  Clavicle Bone Region Severe depletion  Clavicle and Acromion Bone Region Severe depletion  Scapular Bone Region Severe depletion  Dorsal Hand Severe depletion  Patellar Region Mild depletion  Anterior Thigh Region Mild depletion  Posterior Calf Region Mild  depletion  Edema (RD Assessment) Mild  Hair Reviewed  Eyes Reviewed  Mouth Reviewed  Skin Reviewed  Nails Reviewed       Diet Order:   Diet Order            Diet regular Room service appropriate? Yes; Fluid consistency: Thin  Diet effective now                 EDUCATION NEEDS:   Not appropriate for education at this time  Skin:  Skin Assessment: Reviewed RN Assessment (R chest tube)  Last BM:  no BM documented  Height:   Ht Readings from Last 1 Encounters:  05/25/21 5\' 8"  (1.727 m)    Weight:   Wt Readings from Last 1 Encounters:  05/26/21 59.9 kg    Ideal Body Weight:  63.6 kg  BMI:  Body mass index is 20.08 kg/m.  Estimated Nutritional Needs:   Kcal:  1500-1750  Protein:  80-90 gm  Fluid:  >/= 1.6 L    Lucas Mallow, RD, LDN, CNSC Please refer to Amion for contact information.

## 2021-05-26 NOTE — Progress Notes (Signed)
   05/25/21 2141  Assess: MEWS Score  Temp 97.7 F (36.5 C)  BP (!) 179/78  Pulse Rate 98  ECG Heart Rate (!) 113  Resp (!) 22  SpO2 94 %  Assess: MEWS Score  MEWS Temp 0  MEWS Systolic 0  MEWS Pulse 2  MEWS RR 1  MEWS LOC 0  MEWS Score 3  MEWS Score Color Yellow  Assess: if the MEWS score is Yellow or Red  Were vital signs taken at a resting state? Yes  Focused Assessment No change from prior assessment  Early Detection of Sepsis Score *See Row Information* Low  MEWS guidelines implemented *See Row Information* Yes  Treat  MEWS Interventions Escalated (See documentation below)  Escalate  MEWS: Escalate Yellow: discuss with charge nurse/RN and consider discussing with provider and RRT  Notify: Charge Nurse/RN  Name of Charge Nurse/RN Notified Margreta Journey RN  Date Charge Nurse/RN Notified 05/25/21  Time Charge Nurse/RN Notified 2141  Notify: Rapid Response  Name of Rapid Response RN Notified Courtney RN  Date Rapid Response Notified 05/25/21  Time Rapid Response Notified 2141  Document  Patient Outcome Stabilized after interventions  Progress note created (see row info) Yes

## 2021-05-26 NOTE — TOC Benefit Eligibility Note (Signed)
Patient Teacher, English as a foreign language completed.    The patient is currently admitted and upon discharge could be taking Eliquis 5 mg.  The current 30 day co-pay is, $37.00.   The patient is insured through [employer] Patient Advocate Specialist [place] Team Direct Number: [phone]  Fax: [phone]

## 2021-05-26 NOTE — Sedation Documentation (Signed)
Patient transported back to [place] in high fowlers position. sats 94% on 4 liters [place]. Cardizem drip at 5 mg/hr and heparin gtt still off at this point. Per Dr. Laurence Ferrari, can restart heparin gtt once IV access is re-obtained.

## 2021-05-26 NOTE — Consult Note (Signed)
Reason for Consult/Chief Complaint: hydropneumothorax Consultant: Dwyane Dee, MD  Melanie [name] is an 80 y.o. female.   HPI: 89F with a h/o COPD and recent hospitalization after fall from bed presents with SOB, leg swelling, and increased O2 req't.   Past Medical History:  Diagnosis Date  . Anxiety   . Cancer ([place])    rectal ca  . COPD (chronic obstructive pulmonary disease) ([place])   . Thyroid disease     Past Surgical History:  Procedure Laterality Date  . ABDOMINAL HYSTERECTOMY    . COLON SURGERY    . IR THORACENTESIS ASP PLEURAL SPACE W/IMG GUIDE  05/17/2021    Family History  Problem Relation Age of Onset  . Cancer Sister        Lung cancer    Social History:  reports that she has quit smoking. Her smoking use included cigarettes. She has never used smokeless tobacco. She reports that she does not drink alcohol and does not use drugs.  Allergies:  Allergies  Allergen Reactions  . Latex Rash  . Macrodantin Shortness Of Breath  . Guaifenesin Er Itching  . Morphine And Related Nausea Only  . Ditropan [Oxybutynin]     Causes diarrhea - contains Magnesium  . Lipitor [Atorvastatin]     rash  . Lisinopril     Diarrhea/cough  . Magnesium-Containing Compounds     Causes diarrhea  . Pravastatin     Leg cramps  . Zoloft [Sertraline Hcl]     Diarrhea    Medications: I have reviewed the patient's current medications.  Results for orders placed or performed during the hospital encounter of 05/25/21 (from the past 48 hour(s))  Resp Panel by RT-PCR (Flu A&B, Covid) Nasopharyngeal Swab     Status: None   Collection Time: 05/25/21  1:21 PM   Specimen: Nasopharyngeal Swab; Nasopharyngeal(NP) swabs in vial transport medium  Result Value Ref Range   SARS Coronavirus 2 by RT PCR NEGATIVE NEGATIVE    Comment: (NOTE) SARS-CoV-2 target nucleic acids are NOT DETECTED.  The SARS-CoV-2 RNA is generally detectable in upper respiratory specimens during the acute phase of  infection. The lowest concentration of SARS-CoV-2 viral copies this assay can detect is 138 copies/mL. A negative result does not preclude SARS-Cov-2 infection and should not be used as the sole basis for treatment or other patient management decisions. A negative result may occur with  improper specimen collection/handling, submission of specimen other than nasopharyngeal swab, presence of viral mutation(s) within the areas targeted by this assay, and inadequate number of viral copies(<138 copies/mL). A negative result must be combined with clinical observations, patient history, and epidemiological information. The expected result is Negative.  Fact Sheet for Patients:  EntrepreneurPulse.com.au  Fact Sheet for Healthcare Providers:  IncredibleEmployment.be  This test is no t yet approved or cleared by the [place] FDA and  has been authorized for detection and/or diagnosis of SARS-CoV-2 by FDA under an Emergency Use Authorization (EUA). This EUA will remain  in effect (meaning this test can be used) for the duration of the COVID-19 declaration under Section 564(b)(1) of the Act, 21 U.S.C.section 360bbb-3(b)(1), unless the authorization is terminated  or revoked sooner.       Influenza A by PCR NEGATIVE NEGATIVE   Influenza B by PCR NEGATIVE NEGATIVE    Comment: (NOTE) The Xpert Xpress SARS-CoV-2/FLU/RSV plus assay is intended as an aid in the diagnosis of influenza from Nasopharyngeal swab specimens and should not be used as a sole  basis for treatment. Nasal washings and aspirates are unacceptable for Xpert Xpress SARS-CoV-2/FLU/RSV testing.  Fact Sheet for Patients: EntrepreneurPulse.com.au  Fact Sheet for Healthcare Providers: IncredibleEmployment.be  This test is not yet approved or cleared by the [place] FDA and has been authorized for detection and/or diagnosis of SARS-CoV-2 by FDA  under an Emergency Use Authorization (EUA). This EUA will remain in effect (meaning this test can be used) for the duration of the COVID-19 declaration under Section 564(b)(1) of the Act, 21 U.S.C. section 360bbb-3(b)(1), unless the authorization is terminated or revoked.  Performed at [facility], [place] [address] [place], [place] [address]   Basic metabolic panel     Status: Abnormal   Collection Time: 05/25/21  1:56 PM  Result Value Ref Range   Sodium 134 (L) 135 - 145 mmol/L   Potassium 4.6 3.5 - 5.1 mmol/L   Chloride 94 (L) 98 - 111 mmol/L   CO2 33 (H) 22 - 32 mmol/L   Glucose, Bld 111 (H) 70 - 99 mg/dL    Comment: Glucose reference range applies only to samples taken after fasting for at least 8 hours.   BUN 19 8 - 23 mg/dL   Creatinine, Ser 0.72 0.44 - 1.00 mg/dL   Calcium 9.5 8.9 - 10.3 mg/dL   GFR, Estimated >60 >60 mL/min    Comment: (NOTE) Calculated using the CKD-EPI Creatinine Equation (2021)    Anion gap 7 5 - 15    Comment: Performed at [facility], [place] [address] [place], [place] [address]  CBC with Differential     Status: Abnormal   Collection Time: 05/25/21  1:56 PM  Result Value Ref Range   WBC 8.8 4.0 - 10.5 K/uL   RBC 3.63 (L) 3.87 - 5.11 MIL/uL   Hemoglobin 11.1 (L) 12.0 - 15.0 g/dL   HCT 34.5 (L) 36.0 - 46.0 %   MCV 95.0 80.0 - 100.0 fL   MCH 30.6 26.0 - 34.0 pg   MCHC 32.2 30.0 - 36.0 g/dL   RDW 13.0 11.5 - 15.5 %   Platelets 403 (H) 150 - 400 K/uL   nRBC 0.0 0.0 - 0.2 %   Neutrophils Relative % 85 %   Neutro Abs 7.5 1.7 - 7.7 K/uL   Lymphocytes Relative 6 %   Lymphs Abs 0.5 (L) 0.7 - 4.0 K/uL   Monocytes Relative 7 %   Monocytes Absolute 0.6 0.1 - 1.0 K/uL   Eosinophils Relative 2 %   Eosinophils Absolute 0.2 0.0 - 0.5 K/uL   Basophils Relative 0 %   Basophils Absolute 0.0 0.0 - 0.1 K/uL   Immature Granulocytes 0 %   Abs Immature Granulocytes 0.02 0.00 - 0.07 K/uL    Comment: Performed at [facility], [place] [address], [place], [place] [address]  Brain natriuretic peptide     Status: Abnormal   Collection Time: 05/25/21  1:58 PM  Result Value Ref Range   B Natriuretic Peptide 480.6 (H) 0.0 - 100.0 pg/mL    Comment: Performed at [facility], [place] [address], [place], [place] [address]  Hepatic function panel     Status: Abnormal   Collection Time: 05/25/21  1:58 PM  Result Value Ref Range   Total Protein 6.5 6.5 - 8.1 g/dL   Albumin 3.2 (L) 3.5 - 5.0 g/dL   AST 26 15 - 41 U/L   ALT 14 0 - 44 U/L   Alkaline Phosphatase 53 38 -  126 U/L   Total Bilirubin 0.6 0.3 - 1.2 mg/dL   Bilirubin, Direct 0.2 0.0 - 0.2 mg/dL   Indirect Bilirubin 0.4 0.3 - 0.9 mg/dL    Comment: Performed at [facility], [place] [address] [place], [place] [address]  Lactic acid, plasma     Status: None   Collection Time: 05/25/21  2:00 PM  Result Value Ref Range   Lactic Acid, Venous 0.8 0.5 - 1.9 mmol/L    Comment: Performed at [facility], [place] [address], [place], [place] [address]    DG Chest 2 View  Addendum Date: 05/25/2021   ADDENDUM REPORT: 05/25/2021 13:45 ADDENDUM: Critical Value/emergent results were called by telephone at the time of interpretation on 05/25/2021 at 1:45 pm to provider Silverio Decamp, PA, who verbally acknowledged these results. Electronically Signed   By: Lowella Grip III M.D.   On: 05/25/2021 13:45   Result Date: 05/25/2021 CLINICAL DATA:  Shortness of breath EXAM: CHEST - 2 VIEW COMPARISON:  May 20, 2021. FINDINGS: There is a fairly small right apical pneumothorax without tension component. There are multiple displaced rib fractures on the right. There is a moderate pleural effusion on the right with atelectasis and potential consolidation in portions of the right middle and lower lobes. Left lung clear. Heart size and pulmonary vascularity are normal. No adenopathy. There is degenerative change in the  thoracic spine. IMPRESSION: Multiple displaced rib fractures on the right with right pleural effusion and fairly small right apical pneumothorax. No tension component. Atelectasis with potential consolidation in portions of the right middle and lower lobes. Left lung clear. Heart size normal. Aortic Atherosclerosis (ICD10-I70.0). Electronically Signed: By: Lowella Grip III M.D. On: 05/25/2021 13:31   CT Angio Chest PE W/Cm &/Or Wo Cm  Result Date: 05/25/2021 CLINICAL DATA:  Shortness of breath. EXAM: CT ANGIOGRAPHY CHEST WITH CONTRAST TECHNIQUE: Multidetector CT imaging of the chest was performed using the standard protocol during bolus administration of intravenous contrast. Multiplanar CT image reconstructions and MIPs were obtained to evaluate the vascular anatomy. CONTRAST:  29mL OMNIPAQUE IOHEXOL 350 MG/ML SOLN COMPARISON:  May 11, 2021. FINDINGS: Cardiovascular: Satisfactory opacification of the pulmonary arteries to the segmental level. No evidence of pulmonary embolism. Normal heart size. No pericardial effusion. Atherosclerosis of thoracic aorta is noted without aneurysm or dissection. Mediastinum/Nodes: Stable findings consistent with multinodular goiter. No adenopathy is noted. Esophagus is unremarkable. Lungs/Pleura: Left lung is clear. Moderate right hydropneumothorax is noted with loculated effusion seen in the right lower lobe and associated atelectasis of the right lower lobe. Upper Abdomen: No acute abnormality. Musculoskeletal: Stable appearance of right segmental rib fractures and flail chest as noted on prior exam. Review of the MIP images confirms the above findings. IMPRESSION: No definite evidence of pulmonary embolus. Moderate right hydropneumothorax is noted with loculated effusion seen in the right lower lobe and associated atelectasis of the right lower lobe. Stable appearance of right segmental rib fractures and flail chest is noted as described on prior exam. Aortic  Atherosclerosis (ICD10-I70.0). Electronically Signed   By: Marijo Conception M.D.   On: 05/25/2021 16:38   VAS [place] LOWER EXTREMITY VENOUS (DVT) (ONLY [facility] & [facility] 7a-7p)  Result Date: 05/25/2021  Lower Venous DVT Study Patient Name:  Melanie [name]  Date of Exam:   05/25/2021 Medical Rec #: [id number]          Accession #:    [id number] Date of Birth: 07/04/1941  Patient Gender: F Patient Age:   67Y Exam Location:  [facility] Procedure:      VAS [place] LOWER EXTREMITY VENOUS (DVT) Referring Phys: [id number] [place] --------------------------------------------------------------------------------  Indications: Swelling.  Risk Factors: None identified. Comparison Study: No prior studies. Performing Technologist: Oliver Hum RVT  Examination Guidelines: A complete evaluation includes B-mode imaging, spectral Doppler, color Doppler, and power Doppler as needed of all accessible portions of each vessel. Bilateral testing is considered an integral part of a complete examination. Limited examinations for reoccurring indications may be performed as noted. The reflux portion of the exam is performed with the patient in reverse Trendelenburg.  +---------+---------------+---------+-----------+----------+--------------+ RIGHT    CompressibilityPhasicitySpontaneityPropertiesThrombus Aging +---------+---------------+---------+-----------+----------+--------------+ CFV      Full           Yes      Yes                                 +---------+---------------+---------+-----------+----------+--------------+ SFJ      Full                                                        +---------+---------------+---------+-----------+----------+--------------+ FV Prox  Full                                                        +---------+---------------+---------+-----------+----------+--------------+ FV Mid   Full                                                         +---------+---------------+---------+-----------+----------+--------------+ FV DistalFull                                                        +---------+---------------+---------+-----------+----------+--------------+ PFV      Full                                                        +---------+---------------+---------+-----------+----------+--------------+ POP      Full           Yes      Yes                                 +---------+---------------+---------+-----------+----------+--------------+ PTV      Full                                                        +---------+---------------+---------+-----------+----------+--------------+  PERO     Full                                                        +---------+---------------+---------+-----------+----------+--------------+ Gastroc  Partial                                      Acute          +---------+---------------+---------+-----------+----------+--------------+   +---------+---------------+---------+-----------+----------+--------------+ LEFT     CompressibilityPhasicitySpontaneityPropertiesThrombus Aging +---------+---------------+---------+-----------+----------+--------------+ CFV      Full           Yes      Yes                                 +---------+---------------+---------+-----------+----------+--------------+ SFJ      Full                                                        +---------+---------------+---------+-----------+----------+--------------+ FV Prox  Full                                                        +---------+---------------+---------+-----------+----------+--------------+ FV Mid   Full                                                        +---------+---------------+---------+-----------+----------+--------------+ FV DistalFull                                                         +---------+---------------+---------+-----------+----------+--------------+ PFV      Full                                                        +---------+---------------+---------+-----------+----------+--------------+ POP      Full           Yes      Yes                                 +---------+---------------+---------+-----------+----------+--------------+ PTV      Full                                                        +---------+---------------+---------+-----------+----------+--------------+  PERO     Full                                                        +---------+---------------+---------+-----------+----------+--------------+     Summary: RIGHT: - Findings consistent with acute deep vein thrombosis involving the right gastrocnemius veins. - No cystic structure found in the popliteal fossa.  LEFT: - There is no evidence of deep vein thrombosis in the lower extremity.  - No cystic structure found in the popliteal fossa.  *See table(s) above for measurements and observations. Electronically signed by Deitra Mayo MD on 05/25/2021 at 6:43:30 PM.    Final     ROS 10 point review of systems is negative except as listed above in HPI.   Physical Exam Blood pressure (!) 89/47, pulse 98, temperature 97.7 F (36.5 C), temperature source Oral, resp. rate (!) 22, height 5\' 8"  (1.727 m), weight 61.2 kg, SpO2 94 %. Constitutional: well-developed, well-nourished HEENT: pupils equal, round, reactive to light, 71mm b/l, moist conjunctiva, external inspection of ears and nose normal, hearing intact Oropharynx: normal oropharyngeal mucosa, poor dentition Neck: no thyromegaly, trachea midline, no midline cervical tenderness to palpation Chest: breath sounds equal bilaterally, mildly labored respiratory effort, no midline or lateral chest wall tenderness to palpation/deformity Abdomen: soft, NT, no bruising, no hepatosplenomegaly GU: normal female genitalia  Back: no  wounds, no thoracic/lumbar spine tenderness to palpation, no thoracic/lumbar spine stepoffs Rectal: deferred Extremities: 2+ radial and pedal pulses bilaterally, motor and sensation intact to bilateral UE and LE, + peripheral edema b/l MSK: unable to assess gait/station, no clubbing/cyanosis of fingers/toes, normal ROM of all four extremities Skin: warm, dry, no rashes Psych: poor memory, normal mood/affect    Assessment/Plan: 32F readmission with AF-RVR and DVT  AFRVR - on dilt drip per primary team  DVT - recommend heparin gtt, started by primary, CTA chest negative for PE  HPTX - recommend eval by IR for thoracentesis, would likely need heparin gtt held for procedure. As she has already had a pigtail and a thoracentesis during her recent hospitalization, would recommend sending a sample of the fluid for cytology and labs (protein, LDH). Pulm toileting.  FEN - diet per primary team, okay for regular diet from trauma standpoint DVT - heparin gtt DIspo - per primary team   Jesusita Oka, MD General and [facility]

## 2021-05-26 NOTE — Procedures (Signed)
Interventional Radiology Procedure Note  Procedure: Placement of a 58F right sided chest tube.   Complications: None  Estimated Blood Loss: None  Recommendations: - Chest tube to LWS via [place] in am    Signed,  Criselda Peaches, MD

## 2021-05-26 NOTE — Progress Notes (Signed)
Chief Complaint: Patient was seen in consultation today for right hydroptx  Referring Physician(s): *Dr. Karleen Hampshire Dr. Bobbye Morton  Supervising Physician: Jacqulynn Cadet  Patient Status: [facility] - In-pt  History of Present Illness: Melanie [name] is a 80 y.o. female with hx of fall resulting in right rib fractures and hemoptx. Chest tube was placed by trauma team on 5/17 with subsequent removal. Recurrent right effusion, had thora on 5/23. Now admitted with recurrent SOB. CT chest finds recurrent hydroptx. IR is asked to eval for thora vs chest tube. PMHx, meds, labs, imaging, allergies reviewed. Has been NPO today as directed.    Past Medical History:  Diagnosis Date  . Anxiety   . Cancer ([place])    rectal ca  . COPD (chronic obstructive pulmonary disease) ([place])   . Thyroid disease     Past Surgical History:  Procedure Laterality Date  . ABDOMINAL HYSTERECTOMY    . COLON SURGERY    . IR THORACENTESIS ASP PLEURAL SPACE W/IMG GUIDE  05/17/2021    Allergies: Latex, Macrodantin, Guaifenesin er, Morphine and related, Ditropan [oxybutynin], Lipitor [atorvastatin], Lisinopril, Magnesium-containing compounds, Pravastatin, and Zoloft [sertraline hcl]  Medications:  Current Facility-Administered Medications:  .  0.9 %  sodium chloride infusion, , Intravenous, Continuous, Shawna Clamp, MD, Last Rate: 50 mL/hr at 05/26/21 0445, Infusion Verify at 05/26/21 0445 .  acetaminophen (TYLENOL) tablet 1,000 mg, 1,000 mg, Oral, Q8H, Shawna Clamp, MD, 1,000 mg at 05/26/21 0525 .  diltiazem (CARDIZEM) 125 mg in dextrose 5% 125 mL (1 mg/mL) infusion, 5-15 mg/hr, Intravenous, Titrated, Shawna Clamp, MD, Last Rate: 5 mL/hr at 05/26/21 0445, 5 mg/hr at 05/26/21 0445 .  docusate sodium (COLACE) capsule 100 mg, 100 mg, Oral, BID PRN, Shawna Clamp, MD .  heparin ADULT infusion 100 units/mL (25000 units/245mL), 1,100 Units/hr, Intravenous, Continuous, Laren Everts, RPH, Stopped at 05/26/21  0919 .  LORazepam (ATIVAN) tablet 0.5 mg, 0.5 mg, Oral, BID PRN, Shawna Clamp, MD, 0.5 mg at 05/25/21 2218 .  losartan (COZAAR) tablet 100 mg, 100 mg, Oral, Daily, Shawna Clamp, MD .  metoprolol tartrate (LOPRESSOR) tablet 50 mg, 50 mg, Oral, BID, Shawna Clamp, MD, 50 mg at 05/25/21 2218 .  morphine 2 MG/ML injection 2 mg, 2 mg, Intravenous, Once, Sponseller, Rebekah R, PA-C .  ondansetron (ZOFRAN) injection 4 mg, 4 mg, Intravenous, Once, Sponseller, Rebekah R, PA-C .  ondansetron (ZOFRAN) tablet 4 mg, 4 mg, Oral, Q6H PRN **OR** ondansetron (ZOFRAN) injection 4 mg, 4 mg, Intravenous, Q6H PRN, Shawna Clamp, MD .  oxyCODONE (Oxy IR/ROXICODONE) immediate release tablet 5 mg, 5 mg, Oral, Q6H PRN, Shawna Clamp, MD, 5 mg at 05/25/21 1838 .  pantoprazole (PROTONIX) EC tablet 40 mg, 40 mg, Oral, Daily, Shawna Clamp, MD    Family History  Problem Relation Age of Onset  . Cancer Sister        Lung cancer    Social History   Socioeconomic History  . Marital status: Married    Spouse name: Not on file  . Number of children: Not on file  . Years of education: Not on file  . Highest education level: Not on file  Occupational History  . Not on file  Tobacco Use  . Smoking status: Former Smoker    Types: Cigarettes  . Smokeless tobacco: Never Used  Substance and Sexual Activity  . Alcohol use: No  . Drug use: No  . Sexual activity: Not on file  Other Topics Concern  . Not on file  Social  History Narrative   ** Merged History Encounter **       Social Determinants of Health   Financial Resource Strain: Low Risk   . Difficulty of Paying Living Expenses: Not hard at all  Food Insecurity: Not on file  Transportation Needs: No Transportation Needs  . Lack of Transportation (Medical): No  . Lack of Transportation (Non-Medical): No  Physical Activity: Not on file  Stress: Not on file  Social Connections: Not on file     Review of Systems: A 12 point ROS discussed and  pertinent positives are indicated in the HPI above.  All other systems are negative.  Review of Systems  Vital Signs: BP (!) 149/94 (BP Location: Right Arm)   Pulse 93   Temp 97.6 F (36.4 C) (Oral)   Resp 19   Ht 5\' 8"  (1.727 m)   Wt 61.2 kg   SpO2 95%   BMI 20.53 kg/m   Physical Exam Constitutional:      Appearance: She is not ill-appearing.  HENT:     Mouth/Throat:     Mouth: Mucous membranes are moist.     Pharynx: Oropharynx is clear.  Cardiovascular:     Rate and Rhythm: Normal rate and regular rhythm.     Heart sounds: Normal heart sounds.  Pulmonary:     Effort: Pulmonary effort is normal.     Comments: Diminished right breath sounds Skin:    General: Skin is warm and dry.  Neurological:     General: No focal deficit present.     Mental Status: She is alert and oriented to person, place, and time.  Psychiatric:        Mood and Affect: Mood normal.        Thought Content: Thought content normal.      Imaging: DG Chest 1 View  Result Date: 05/17/2021 CLINICAL DATA:  Status post right-sided thoracentesis EXAM: CHEST  1 VIEW COMPARISON:  Film from earlier in the same day. FINDINGS: Significant reduction in right-sided pleural effusion is noted. Small residual effusion is seen. No pneumothorax is noted. Cardiac shadow is stable. Left lung remains clear. Multiple right-sided rib fractures are again noted. IMPRESSION: No pneumothorax following right-sided thoracentesis. Small residual effusion is seen. Electronically Signed   By: Inez Catalina M.D.   On: 05/17/2021 16:22   DG Chest 2 View  Addendum Date: 05/25/2021   ADDENDUM REPORT: 05/25/2021 13:45 ADDENDUM: Critical Value/emergent results were called by telephone at the time of interpretation on 05/25/2021 at 1:45 pm to provider Silverio Decamp, PA, who verbally acknowledged these results. Electronically Signed   By: Lowella Grip III M.D.   On: 05/25/2021 13:45   Result Date: 05/25/2021 CLINICAL DATA:   Shortness of breath EXAM: CHEST - 2 VIEW COMPARISON:  May 20, 2021. FINDINGS: There is a fairly small right apical pneumothorax without tension component. There are multiple displaced rib fractures on the right. There is a moderate pleural effusion on the right with atelectasis and potential consolidation in portions of the right middle and lower lobes. Left lung clear. Heart size and pulmonary vascularity are normal. No adenopathy. There is degenerative change in the thoracic spine. IMPRESSION: Multiple displaced rib fractures on the right with right pleural effusion and fairly small right apical pneumothorax. No tension component. Atelectasis with potential consolidation in portions of the right middle and lower lobes. Left lung clear. Heart size normal. Aortic Atherosclerosis (ICD10-I70.0). Electronically Signed: By: Lowella Grip III M.D. On: 05/25/2021 13:31   CT  Head Wo Contrast  Result Date: 05/11/2021 CLINICAL DATA:  Fall, head injury, neck pain, right chest pain, flail chest, hypoxia abdominal injury EXAM: CT HEAD WITHOUT CONTRAST CT CERVICAL SPINE WITHOUT CONTRAST CT CHEST, ABDOMEN AND PELVIS WITH CONTRAST TECHNIQUE: Contiguous axial images were obtained from the base of the skull through the vertex without intravenous contrast. Multidetector CT imaging of the cervical spine was performed without intravenous contrast. Multiplanar CT image reconstructions were also generated. Multidetector CT imaging of the chest, abdomen and pelvis was performed following the standard protocol during bolus administration of intravenous contrast. CONTRAST:  46mL OMNIPAQUE IOHEXOL 300 MG/ML  SOLN COMPARISON:  None. FINDINGS: CT HEAD FINDINGS Brain: Normal anatomic configuration. Parenchymal volume loss is commensurate with the patient's age. Moderate periventricular white matter changes are present likely reflecting the sequela of small vessel ischemia. No abnormal intra or extra-axial mass lesion or fluid  collection. No abnormal mass effect or midline shift. No evidence of acute intracranial hemorrhage or infarct. Ventricular size is normal. Cerebellum unremarkable. Vascular: No asymmetric hyperdense vasculature at the skull base. Skull: Intact Sinuses/Orbits: Paranasal sinuses are clear. Orbits are unremarkable. Other: Mastoid air cells and middle ear cavities are clear. CT CERVICAL FINDINGS Alignment: Normal cervical lordosis.  No listhesis. Skull base and vertebrae: The craniocervical junction is unremarkable. The atlantodental interval is not widened. 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 No acute fracture of the cervical spine. Soft tissues and spinal canal: There is extensive subcutaneous gas within the a soft tissues of the right neck. No prevertebral soft tissue swelling. No paraspinal fluid collections. No canal hematoma. The spinal canal is widely patent. Disc levels: There is intervertebral disc space narrowing and endplate remodeling at X7-3 and C6-7 in keeping with changes of moderate to severe degenerative disc disease. 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 Remaining intervertebral disc spaces are preserved. Vertebral body heights are preserved. The prevertebral soft tissues are not thickened. The spinal canal is widely patent on sagittal reformats. Axial images demonstrates multilevel uncovertebral and facet arthrosis resulting in moderate neuroforaminal narrowing on the left at C3-4 and C4-5. Other:  Thyroid goiter noted. CT CHEST FINDINGS Cardiovascular: No significant coronary artery calcification. Global cardiac size within normal limits. No pericardial effusion. Central pulmonary arteries are mildly enlarged in keeping with changes of pulmonary arterial hypertension. Moderate atherosclerotic calcification is seen within the thoracic aorta. No aortic aneurysm. Mediastinum/Nodes: Endotracheal tube seen 4.6 cm above the carina. Nasogastric tube  extends into the distal body of the stomach. The esophagus is otherwise unremarkable. No pneumomediastinum. No mediastinal hematoma. No pathologic thoracic adenopathy. Multinodular thyroid goiter noted with nodules measuring up to 9 mm. Lungs/Pleura: Moderate right pneumothorax is present. No mediastinal shift to suggest tension physiology. Right small bore chest tube is in place which initially tracks within the minor fissure, but centrally appears to penetrate the right upper lobe pulmonary parenchyma. There is focal consolidation within the a dependent right lower lobe. Multiple small posttraumatic pneumatocele is a are seen within the lateral segment of the right lower lobe, best seen on image # 110. Small right mixed attenuation pleural effusion is present in keeping with a hemothorax. Mild centrilobular emphysema. Left lung is clear. No pneumothorax or pleural effusion on the left. Musculoskeletal: There are displaced fractures of the right sixth, seventh, eighth, ninth, and tenth ribs in 2 places resulting in a free-floating segment of the posterior thoracic cage. Significantly displaced right eighth rib fracture fragment appears  adjacent to the multiple traumatic pneumatocele seen within the right lower lobe. There is extensive subcutaneous gas within the right chest wall. CT ABDOMEN PELVIS FINDINGS Hepatobiliary: No focal liver abnormality is seen. No gallstones, gallbladder wall thickening, or biliary dilatation. Pancreas: Unremarkable Spleen: Unremarkable Adrenals/Urinary Tract: The adrenal glands are unremarkable. The kidneys are normal in size and position. Multiple simple cortical cysts are seen within the right kidney. The kidneys are otherwise unremarkable. The bladder is unremarkable save for mild pelvic descent with a resultant cystocele. Stomach/Bowel: Surgical changes of sigmoid colectomy, partial distal small-bowel resection, and appendectomy are identified. The residual stomach, small bowel,  and large bowel are otherwise unremarkable Vascular/Lymphatic: Moderate aortoiliac atherosclerotic calcification. No aortic aneurysm. Retroaortic left renal vein. No pathologic adenopathy within the abdomen and pelvis. Reproductive: Status post hysterectomy. No adnexal masses. Other: Pelvic descent noted.  No abdominal wall hernia. Musculoskeletal: Advanced degenerative changes are seen at the lumbosacral junction with grade 1 anterolisthesis of a L4 upon L5 and mild retrolisthesis of L5 upon S1. There are chronic appearing erosive changes involving the L5 vertebral body with approximately 50% loss of height. No acute bone abnormality within the abdomen and pelvis. IMPRESSION: No acute intracranial injury.  No calvarial fracture. No acute fracture or listhesis of the cervical spine. Right flail chest with fractures of the right 6-10 ribs in multiple locations resulting in a free-floating posterior thoracic cage segment. Displaced eighth rib fragment appears in close proximity to multiple traumatic pneumatocele is a within the right lower lobe. Moderate right hemo pneumothorax. Right chest tube is in place but may penetrate the right upper lobe pulmonary parenchyma centrally. Right lower lobe posterior basal consolidation, likely posttraumatic in nature. Mild centrilobular emphysema. Morphologic changes in keeping with pulmonary arterial hypertension. No acute intra-abdominal injury. These results were called by telephone at the time of interpretation on 05/11/2021 at 2:06 am to provider [facility] , who verbally acknowledged these results. Aortic Atherosclerosis (ICD10-I70.0) and Emphysema (ICD10-J43.9). Electronically Signed   By: Fidela Salisbury MD   On: 05/11/2021 02:11   CT Angio Chest PE W/Cm &/Or Wo Cm  Result Date: 05/25/2021 CLINICAL DATA:  Shortness of breath. EXAM: CT ANGIOGRAPHY CHEST WITH CONTRAST TECHNIQUE: Multidetector CT imaging of the chest was performed using the standard protocol during  bolus administration of intravenous contrast. Multiplanar CT image reconstructions and MIPs were obtained to evaluate the vascular anatomy. CONTRAST:  75mL OMNIPAQUE IOHEXOL 350 MG/ML SOLN COMPARISON:  May 11, 2021. FINDINGS: Cardiovascular: Satisfactory opacification of the pulmonary arteries to the segmental level. No evidence of pulmonary embolism. Normal heart size. No pericardial effusion. Atherosclerosis of thoracic aorta is noted without aneurysm or dissection. Mediastinum/Nodes: Stable findings consistent with multinodular goiter. No adenopathy is noted. Esophagus is unremarkable. Lungs/Pleura: Left lung is clear. Moderate right hydropneumothorax is noted with loculated effusion seen in the right lower lobe and associated atelectasis of the right lower lobe. Upper Abdomen: No acute abnormality. Musculoskeletal: Stable appearance of right segmental rib fractures and flail chest as noted on prior exam. Review of the MIP images confirms the above findings. IMPRESSION: No definite evidence of pulmonary embolus. Moderate right hydropneumothorax is noted with loculated effusion seen in the right lower lobe and associated atelectasis of the right lower lobe. Stable appearance of right segmental rib fractures and flail chest is noted as described on prior exam. Aortic Atherosclerosis (ICD10-I70.0). Electronically Signed   By: Marijo Conception M.D.   On: 05/25/2021 16:38   CT Cervical Spine Wo Contrast  Result  Date: 05/11/2021 CLINICAL DATA:  Fall, head injury, neck pain, right chest pain, flail chest, hypoxia abdominal injury EXAM: CT HEAD WITHOUT CONTRAST CT CERVICAL SPINE WITHOUT CONTRAST CT CHEST, ABDOMEN AND PELVIS WITH CONTRAST TECHNIQUE: Contiguous axial images were obtained from the base of the skull through the vertex without intravenous contrast. Multidetector CT imaging of the cervical spine was performed without intravenous contrast. Multiplanar CT image reconstructions were also generated.  Multidetector CT imaging of the chest, abdomen and pelvis was performed following the standard protocol during bolus administration of intravenous contrast. CONTRAST:  44mL OMNIPAQUE IOHEXOL 300 MG/ML  SOLN COMPARISON:  None. FINDINGS: CT HEAD FINDINGS Brain: Normal anatomic configuration. Parenchymal volume loss is commensurate with the patient's age. Moderate periventricular white matter changes are present likely reflecting the sequela of small vessel ischemia. No abnormal intra or extra-axial mass lesion or fluid collection. No abnormal mass effect or midline shift. No evidence of acute intracranial hemorrhage or infarct. Ventricular size is normal. Cerebellum unremarkable. Vascular: No asymmetric hyperdense vasculature at the skull base. Skull: Intact Sinuses/Orbits: Paranasal sinuses are clear. Orbits are unremarkable. Other: Mastoid air cells and middle ear cavities are clear. CT CERVICAL FINDINGS Alignment: Normal cervical lordosis.  No listhesis. Skull base and vertebrae: The craniocervical junction is unremarkable. The atlantodental interval is not widened. 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 No acute fracture of the cervical spine. Soft tissues and spinal canal: There is extensive subcutaneous gas within the a soft tissues of the right neck. No prevertebral soft tissue swelling. No paraspinal fluid collections. No canal hematoma. The spinal canal is widely patent. Disc levels: There is intervertebral disc space narrowing and endplate remodeling at G8-6 and C6-7 in keeping with changes of moderate to severe degenerative disc disease. 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 Remaining intervertebral disc spaces are preserved. Vertebral body heights are preserved. The prevertebral soft tissues are not thickened. The spinal canal is widely patent on sagittal reformats. Axial images demonstrates multilevel uncovertebral and facet arthrosis resulting in  moderate neuroforaminal narrowing on the left at C3-4 and C4-5. Other:  Thyroid goiter noted. CT CHEST FINDINGS Cardiovascular: No significant coronary artery calcification. Global cardiac size within normal limits. No pericardial effusion. Central pulmonary arteries are mildly enlarged in keeping with changes of pulmonary arterial hypertension. Moderate atherosclerotic calcification is seen within the thoracic aorta. No aortic aneurysm. Mediastinum/Nodes: Endotracheal tube seen 4.6 cm above the carina. Nasogastric tube extends into the distal body of the stomach. The esophagus is otherwise unremarkable. No pneumomediastinum. No mediastinal hematoma. No pathologic thoracic adenopathy. Multinodular thyroid goiter noted with nodules measuring up to 9 mm. Lungs/Pleura: Moderate right pneumothorax is present. No mediastinal shift to suggest tension physiology. Right small bore chest tube is in place which initially tracks within the minor fissure, but centrally appears to penetrate the right upper lobe pulmonary parenchyma. There is focal consolidation within the a dependent right lower lobe. Multiple small posttraumatic pneumatocele is a are seen within the lateral segment of the right lower lobe, best seen on image # 110. Small right mixed attenuation pleural effusion is present in keeping with a hemothorax. Mild centrilobular emphysema. Left lung is clear. No pneumothorax or pleural effusion on the left. Musculoskeletal: There are displaced fractures of the right sixth, seventh, eighth, ninth, and tenth ribs in 2 places resulting in a free-floating segment of the posterior thoracic cage. Significantly displaced right eighth rib fracture fragment appears adjacent to the multiple traumatic  pneumatocele seen within the right lower lobe. There is extensive subcutaneous gas within the right chest wall. CT ABDOMEN PELVIS FINDINGS Hepatobiliary: No focal liver abnormality is seen. No gallstones, gallbladder wall thickening,  or biliary dilatation. Pancreas: Unremarkable Spleen: Unremarkable Adrenals/Urinary Tract: The adrenal glands are unremarkable. The kidneys are normal in size and position. Multiple simple cortical cysts are seen within the right kidney. The kidneys are otherwise unremarkable. The bladder is unremarkable save for mild pelvic descent with a resultant cystocele. Stomach/Bowel: Surgical changes of sigmoid colectomy, partial distal small-bowel resection, and appendectomy are identified. The residual stomach, small bowel, and large bowel are otherwise unremarkable Vascular/Lymphatic: Moderate aortoiliac atherosclerotic calcification. No aortic aneurysm. Retroaortic left renal vein. No pathologic adenopathy within the abdomen and pelvis. Reproductive: Status post hysterectomy. No adnexal masses. Other: Pelvic descent noted.  No abdominal wall hernia. Musculoskeletal: Advanced degenerative changes are seen at the lumbosacral junction with grade 1 anterolisthesis of a L4 upon L5 and mild retrolisthesis of L5 upon S1. There are chronic appearing erosive changes involving the L5 vertebral body with approximately 50% loss of height. No acute bone abnormality within the abdomen and pelvis. IMPRESSION: No acute intracranial injury.  No calvarial fracture. No acute fracture or listhesis of the cervical spine. Right flail chest with fractures of the right 6-10 ribs in multiple locations resulting in a free-floating posterior thoracic cage segment. Displaced eighth rib fragment appears in close proximity to multiple traumatic pneumatocele is a within the right lower lobe. Moderate right hemo pneumothorax. Right chest tube is in place but may penetrate the right upper lobe pulmonary parenchyma centrally. Right lower lobe posterior basal consolidation, likely posttraumatic in nature. Mild centrilobular emphysema. Morphologic changes in keeping with pulmonary arterial hypertension. No acute intra-abdominal injury. These results were  called by telephone at the time of interpretation on 05/11/2021 at 2:06 am to provider [facility] , who verbally acknowledged these results. Aortic Atherosclerosis (ICD10-I70.0) and Emphysema (ICD10-J43.9). Electronically Signed   By: Fidela Salisbury MD   On: 05/11/2021 02:11   DG Pelvis Portable  Result Date: 05/11/2021 CLINICAL DATA:  Fall, right hip pain EXAM: PORTABLE PELVIS 1-2 VIEWS COMPARISON:  None. FINDINGS: There is no evidence of pelvic fracture or diastasis. No pelvic bone lesions are seen. IMPRESSION: Negative. Electronically Signed   By: Fidela Salisbury MD   On: 05/11/2021 02:11   CT CHEST ABDOMEN PELVIS W CONTRAST  Result Date: 05/11/2021 CLINICAL DATA:  Fall, head injury, neck pain, right chest pain, flail chest, hypoxia abdominal injury EXAM: CT HEAD WITHOUT CONTRAST CT CERVICAL SPINE WITHOUT CONTRAST CT CHEST, ABDOMEN AND PELVIS WITH CONTRAST TECHNIQUE: Contiguous axial images were obtained from the base of the skull through the vertex without intravenous contrast. Multidetector CT imaging of the cervical spine was performed without intravenous contrast. Multiplanar CT image reconstructions were also generated. Multidetector CT imaging of the chest, abdomen and pelvis was performed following the standard protocol during bolus administration of intravenous contrast. CONTRAST:  86mL OMNIPAQUE IOHEXOL 300 MG/ML  SOLN COMPARISON:  None. FINDINGS: CT HEAD FINDINGS Brain: Normal anatomic configuration. Parenchymal volume loss is commensurate with the patient's age. Moderate periventricular white matter changes are present likely reflecting the sequela of small vessel ischemia. No abnormal intra or extra-axial mass lesion or fluid collection. No abnormal mass effect or midline shift. No evidence of acute intracranial hemorrhage or infarct. Ventricular size is normal. Cerebellum unremarkable. Vascular: No asymmetric hyperdense vasculature at the skull base. Skull: Intact Sinuses/Orbits:  Paranasal sinuses are clear. Orbits are  unremarkable. Other: Mastoid air cells and middle ear cavities are clear. CT CERVICAL FINDINGS Alignment: Normal cervical lordosis.  No listhesis. Skull base and vertebrae: The craniocervical junction is unremarkable. The atlantodental interval is not widened. 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 No acute fracture of the cervical spine. Soft tissues and spinal canal: There is extensive subcutaneous gas within the a soft tissues of the right neck. No prevertebral soft tissue swelling. No paraspinal fluid collections. No canal hematoma. The spinal canal is widely patent. Disc levels: There is intervertebral disc space narrowing and endplate remodeling at X5-2 and C6-7 in keeping with changes of moderate to severe degenerative disc disease. 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 Remaining intervertebral disc spaces are preserved. Vertebral body heights are preserved. The prevertebral soft tissues are not thickened. The spinal canal is widely patent on sagittal reformats. Axial images demonstrates multilevel uncovertebral and facet arthrosis resulting in moderate neuroforaminal narrowing on the left at C3-4 and C4-5. Other:  Thyroid goiter noted. CT CHEST FINDINGS Cardiovascular: No significant coronary artery calcification. Global cardiac size within normal limits. No pericardial effusion. Central pulmonary arteries are mildly enlarged in keeping with changes of pulmonary arterial hypertension. Moderate atherosclerotic calcification is seen within the thoracic aorta. No aortic aneurysm. Mediastinum/Nodes: Endotracheal tube seen 4.6 cm above the carina. Nasogastric tube extends into the distal body of the stomach. The esophagus is otherwise unremarkable. No pneumomediastinum. No mediastinal hematoma. No pathologic thoracic adenopathy. Multinodular thyroid goiter noted with nodules measuring up to 9 mm. Lungs/Pleura: Moderate  right pneumothorax is present. No mediastinal shift to suggest tension physiology. Right small bore chest tube is in place which initially tracks within the minor fissure, but centrally appears to penetrate the right upper lobe pulmonary parenchyma. There is focal consolidation within the a dependent right lower lobe. Multiple small posttraumatic pneumatocele is a are seen within the lateral segment of the right lower lobe, best seen on image # 110. Small right mixed attenuation pleural effusion is present in keeping with a hemothorax. Mild centrilobular emphysema. Left lung is clear. No pneumothorax or pleural effusion on the left. Musculoskeletal: There are displaced fractures of the right sixth, seventh, eighth, ninth, and tenth ribs in 2 places resulting in a free-floating segment of the posterior thoracic cage. Significantly displaced right eighth rib fracture fragment appears adjacent to the multiple traumatic pneumatocele seen within the right lower lobe. There is extensive subcutaneous gas within the right chest wall. CT ABDOMEN PELVIS FINDINGS Hepatobiliary: No focal liver abnormality is seen. No gallstones, gallbladder wall thickening, or biliary dilatation. Pancreas: Unremarkable Spleen: Unremarkable Adrenals/Urinary Tract: The adrenal glands are unremarkable. The kidneys are normal in size and position. Multiple simple cortical cysts are seen within the right kidney. The kidneys are otherwise unremarkable. The bladder is unremarkable save for mild pelvic descent with a resultant cystocele. Stomach/Bowel: Surgical changes of sigmoid colectomy, partial distal small-bowel resection, and appendectomy are identified. The residual stomach, small bowel, and large bowel are otherwise unremarkable Vascular/Lymphatic: Moderate aortoiliac atherosclerotic calcification. No aortic aneurysm. Retroaortic left renal vein. No pathologic adenopathy within the abdomen and pelvis. Reproductive: Status post hysterectomy. No  adnexal masses. Other: Pelvic descent noted.  No abdominal wall hernia. Musculoskeletal: Advanced degenerative changes are seen at the lumbosacral junction with grade 1 anterolisthesis of a L4 upon L5 and mild retrolisthesis of L5 upon S1. There are chronic appearing erosive changes involving the L5 vertebral body with approximately 50% loss of  height. No acute bone abnormality within the abdomen and pelvis. IMPRESSION: No acute intracranial injury.  No calvarial fracture. No acute fracture or listhesis of the cervical spine. Right flail chest with fractures of the right 6-10 ribs in multiple locations resulting in a free-floating posterior thoracic cage segment. Displaced eighth rib fragment appears in close proximity to multiple traumatic pneumatocele is a within the right lower lobe. Moderate right hemo pneumothorax. Right chest tube is in place but may penetrate the right upper lobe pulmonary parenchyma centrally. Right lower lobe posterior basal consolidation, likely posttraumatic in nature. Mild centrilobular emphysema. Morphologic changes in keeping with pulmonary arterial hypertension. No acute intra-abdominal injury. These results were called by telephone at the time of interpretation on 05/11/2021 at 2:06 am to provider [facility] , who verbally acknowledged these results. Aortic Atherosclerosis (ICD10-I70.0) and Emphysema (ICD10-J43.9). Electronically Signed   By: Fidela Salisbury MD   On: 05/11/2021 02:11   DG CHEST PORT 1 VIEW  Result Date: 05/20/2021 CLINICAL DATA:  Pneumothorax.  Rib fractures. EXAM: PORTABLE CHEST 1 VIEW COMPARISON:  05/19/2021.  CT 05/11/2021. FINDINGS: Mediastinum and hilar structures normal. Heart size normal. Persistent right base atelectasis/infiltrate. Small right pleural effusion again noted. No pneumothorax noted on today's exam. Thoracic fractures best identified by prior CT. IMPRESSION: Persistent right base atelectasis/infiltrate. Small right pleural effusion  again noted. No pneumothorax noted on today's exam. Electronically Signed   By: Marcello Moores  Register   On: 05/20/2021 07:07   DG CHEST PORT 1 VIEW  Result Date: 05/19/2021 CLINICAL DATA:  Shortness of breath.  Rib fracture. EXAM: PORTABLE CHEST 1 VIEW COMPARISON:  Chest x-ray 05/18/2021, CT chest 05/11/2021 FINDINGS: The heart size and mediastinal contours are within normal limits. Biapical pleural/pulmonary scarring. No focal consolidation. No pulmonary edema. Interval increase in trace to small volume right pleural effusion. Redemonstration of right mid to lower lung zone airspace opacity. Persistent trace right pneumothorax. No left pneumothorax. No acute osseous abnormality. IMPRESSION: 1. Interval increase in trace to small volume right pleural fluid. 2. Persistent trace right pneumothorax. 3. Right mid to lower lung zone airspace opacity again noted. Electronically Signed   By: Iven Finn M.D.   On: 05/19/2021 05:47   DG CHEST PORT 1 VIEW  Result Date: 05/18/2021 CLINICAL DATA:  Shortness of breath. EXAM: PORTABLE CHEST 1 VIEW COMPARISON:  05/17/2021. FINDINGS: Mediastinum and hilar structures normal. Right base atelectasis/infiltrate. Small right pleural effusion, improved from prior exam. Miniscule right apical pneumothorax cannot be excluded. Heart size stable. Multiple right rib fractures. Degenerative change thoracic spine. IMPRESSION: 1. Right base atelectasis/infiltrate. Small right pleural effusion, improved from prior exam. 2. Miniscule right apical pneumothorax cannot be excluded, follow-up on subsequent exams suggested. 3.  Multiple right rib fractures again noted. These results will be called to the ordering clinician or representative by the Radiologist Assistant, and communication documented in the PACS or [employer]. Electronically Signed   By: Marcello Moores  Register   On: 05/18/2021 05:24   DG CHEST PORT 1 VIEW  Result Date: 05/17/2021 CLINICAL DATA:  Shortness of breath EXAM:  PORTABLE CHEST 1 VIEW COMPARISON:  05/16/2021 FINDINGS: Cardiac shadow is stable. Aortic calcifications are again seen. Left lung is clear. Right sided pleural effusion is again noted with underlying atelectasis. The overall appearance is stable from the prior study. IMPRESSION: No change in right effusion and focal infiltrate/atelectasis. Electronically Signed   By: Inez Catalina M.D.   On: 05/17/2021 11:57   DG CHEST PORT 1 VIEW  Result Date:  05/16/2021 CLINICAL DATA:  Shortness of breath EXAM: PORTABLE CHEST 1 VIEW COMPARISON:  05/15/2021 FINDINGS: Moderate right pleural effusion with right lower lobe consolidation. Left lung clear. Heart is normal size. Aorta calcifications. No acute bony abnormality. No pneumothorax. IMPRESSION: Moderate right pleural effusion with right lower lobe infiltrate. No change. Electronically Signed   By: Rolm Baptise M.D.   On: 05/16/2021 14:14   DG CHEST PORT 1 VIEW  Result Date: 05/15/2021 CLINICAL DATA:  Chest tube removed, pneumothorax EXAM: PORTABLE CHEST 1 VIEW COMPARISON:  05/15/2021 FINDINGS: Interval removal of right chest tube. No visible pneumothorax. Moderate right pleural effusion with right lower lobe airspace opacities, unchanged. Left lung clear. Heart is normal size. IMPRESSION: Interval removal of right chest tube without visible pneumothorax. Continued moderate right effusion with right lower lobe atelectasis or infiltrate. Electronically Signed   By: Rolm Baptise M.D.   On: 05/15/2021 15:52   DG CHEST PORT 1 VIEW  Result Date: 05/15/2021 CLINICAL DATA:  Pneumothorax. EXAM: PORTABLE CHEST 1 VIEW COMPARISON:  May 14, 2021 FINDINGS: The right-sided chest tube is stable. A small right apical pneumothorax is stable. Effusion with underlying opacity on the right is stable. The cardiomediastinal silhouette is unchanged. The left lung remains well aerated. No other changes. IMPRESSION: 1. Stable right chest tube and small right apical pneumothorax. Stable right  pleural effusion with underlying opacity, likely atelectasis. Electronically Signed   By: Dorise Bullion III M.D   On: 05/15/2021 08:11   DG CHEST PORT 1 VIEW  Result Date: 05/14/2021 CLINICAL DATA:  Pneumothorax EXAM: PORTABLE CHEST 1 VIEW COMPARISON:  05/13/2021 FINDINGS: Pulmonary insufflation is stable. Right-sided pigtail chest tube is unchanged in position. Tiny right apical pneumothorax has decreased in size, now nearly completely resolved. Moderate right pleural effusion with associated right basilar compressive atelectasis and superimposed right basilar pulmonary infiltrate are unchanged. Left lung is clear. No pneumothorax or pleural effusion on the left. Multiple acute right rib fractures again noted. Cardiac size within normal limits. Pulmonary vascularity is normal. IMPRESSION: Decreasing, miniscule right apical pneumothorax, now nearly completely resolved. Right chest tube unchanged. Stable moderate right pleural effusion and associated right basilar atelectasis and infiltrate. Electronically Signed   By: Fidela Salisbury MD   On: 05/14/2021 05:15   DG CHEST PORT 1 VIEW  Result Date: 05/13/2021 CLINICAL DATA:  Pneumothorax. EXAM: PORTABLE CHEST 1 VIEW COMPARISON:  05/12/2021. FINDINGS: Mediastinum and hilar structures are normal. Heart size stable. Right chest tube in stable position. Improved right apical pneumothorax with mild residual. Persistent right base atelectasis/infiltrate and right-sided pleural effusion. No interim change. Multiple right rib fractures again noted. Right chest wall subcutaneous emphysema again noted. IMPRESSION: 1. Right chest tube in stable position. Improved right apical pneumothorax with mild residual. 2. Persistent right base atelectasis/infiltrate right-sided pleural effusion. No interim change. 3. Multiple right rib fractures again noted. Right chest wall subcutaneous emphysema again noted. Electronically Signed   By: Marcello Moores  Register   On: 05/13/2021 05:21    DG CHEST PORT 1 VIEW  Result Date: 05/12/2021 CLINICAL DATA:  Right pneumothorax EXAM: PORTABLE CHEST 1 VIEW COMPARISON:  05/11/2021 FINDINGS: Interval extubation and removal of the nasogastric tube. Pulmonary insufflation is preserved. Right mid lung zone pigtail chest tube is unchanged. Small right apical pneumothorax is unchanged. Enlarging, small to moderate right pleural effusion is present with increasing right basilar compressive atelectasis. Left lung is clear. No pneumothorax or pleural effusion on the left. Cardiac size within normal limits. Pulmonary vascularity is normal. Multiple  acute right rib fractures are again identified. Subcutaneous gas within the right chest wall appears stable. IMPRESSION: Interval extubation with preservation of pulmonary insufflation. Right chest tube in place. Stable small right apical pneumothorax. Enlarging right basilar small to moderate pleural effusion. Multiple acute right rib fractures again identified. Electronically Signed   By: Fidela Salisbury MD   On: 05/12/2021 05:10   DG Chest Port 1 View  Result Date: 05/11/2021 CLINICAL DATA:  Chest tube placement EXAM: PORTABLE CHEST 1 VIEW COMPARISON:  05/11/2021 FINDINGS: Endotracheal tube is 4.4 cm above the carina. Nasogastric tube extends into the upper abdomen beyond the margin of the examination. Pigtail chest tube overlies the right mid lung zone. Small right apical pneumothorax persists, likely stable when accounting for changes in patient positioning. Moderate subcutaneous gas noted within the right chest wall. Multiple acute right rib fractures again noted. Mild right basilar atelectasis or infiltrate persists. Left lung is clear. No pneumothorax or pleural effusion on the left. Cardiac size within normal limits. IMPRESSION: Stable support lines and tubes. Stable small right apical pneumothorax.  Right chest tube in place. Unchanged mild right basilar atelectasis or infiltrate. Multiple acute right rib  fractures. Electronically Signed   By: Fidela Salisbury MD   On: 05/11/2021 06:37   DG Chest Portable 1 View  Result Date: 05/11/2021 CLINICAL DATA:  Trauma EXAM: PORTABLE CHEST 1 VIEW COMPARISON:  05/11/2021 FINDINGS: There is a pigtail catheter in the right chest. Unchanged size of right pneumothorax. Multiple right-sided rib fractures again seen. IMPRESSION: Unchanged size of right pneumothorax status post chest tube placement. Electronically Signed   By: Ulyses Jarred M.D.   On: 05/11/2021 02:37   DG Chest Port 1 View  Result Date: 05/11/2021 CLINICAL DATA:  Level 1 trauma EXAM: PORTABLE CHEST 1 VIEW COMPARISON:  May 21, 2020 FINDINGS: The heart size and mediastinal contours are within normal limits. Multiple displaced right-sided rib fractures including the right sixth, seventh, eighth and ninth ribs. Small small right hydropneumothorax with left basilar airspace opacities. No evidence of tension. Left lung is clear. Right chest wall subcutaneous emphysema. IMPRESSION: 1. Small right hydropneumothorax without evidence of tension. Right basilar airspace opacities. 2. Multiple displaced right-sided rib fractures with associated right basilar airspace opacities. Electronically Signed   By: Dahlia Bailiff MD   On: 05/11/2021 01:07   VAS [place] LOWER EXTREMITY VENOUS (DVT) (ONLY [facility] & [facility] 7a-7p)  Result Date: 05/25/2021  Lower Venous DVT Study Patient Name:  FRAYA UEDA Kain  Date of Exam:   05/25/2021 Medical Rec #: [id number]          Accession #:    [id number] Date of Birth: 11/12/1941           Patient Gender: F Patient Age:   79Y Exam Location[facility]  [facility] Procedure:      VAS [place] LOWER EXTREMITY VENOUS (DVT) Referring Phys: [id number] [place] --------------------------------------------------------------------------------  Indications: Swelling.  Risk Factors: None identified. Comparison Study: No prior studies. Performing Technologist: Oliver Hum RVT  Examination Guidelines: A  complete evaluation includes B-mode imaging, spectral Doppler, color Doppler, and power Doppler as needed of all accessible portions of each vessel. Bilateral testing is considered an integral part of a complete examination. Limited examinations for reoccurring indications may be performed as noted. The reflux portion of the exam is performed with the patient in reverse Trendelenburg.  +---------+---------------+---------+-----------+----------+--------------+ RIGHT    CompressibilityPhasicitySpontaneityPropertiesThrombus Aging +---------+---------------+---------+-----------+----------+--------------+ CFV      Full  Yes      Yes                                 +---------+---------------+---------+-----------+----------+--------------+ SFJ      Full                                                        +---------+---------------+---------+-----------+----------+--------------+ FV Prox  Full                                                        +---------+---------------+---------+-----------+----------+--------------+ FV Mid   Full                                                        +---------+---------------+---------+-----------+----------+--------------+ FV DistalFull                                                        +---------+---------------+---------+-----------+----------+--------------+ PFV      Full                                                        +---------+---------------+---------+-----------+----------+--------------+ POP      Full           Yes      Yes                                 +---------+---------------+---------+-----------+----------+--------------+ PTV      Full                                                        +---------+---------------+---------+-----------+----------+--------------+ PERO     Full                                                         +---------+---------------+---------+-----------+----------+--------------+ Gastroc  Partial                                      Acute          +---------+---------------+---------+-----------+----------+--------------+   +---------+---------------+---------+-----------+----------+--------------+ LEFT     CompressibilityPhasicitySpontaneityPropertiesThrombus Aging +---------+---------------+---------+-----------+----------+--------------+ CFV      Full  Yes      Yes                                 +---------+---------------+---------+-----------+----------+--------------+ SFJ      Full                                                        +---------+---------------+---------+-----------+----------+--------------+ FV Prox  Full                                                        +---------+---------------+---------+-----------+----------+--------------+ FV Mid   Full                                                        +---------+---------------+---------+-----------+----------+--------------+ FV DistalFull                                                        +---------+---------------+---------+-----------+----------+--------------+ PFV      Full                                                        +---------+---------------+---------+-----------+----------+--------------+ POP      Full           Yes      Yes                                 +---------+---------------+---------+-----------+----------+--------------+ PTV      Full                                                        +---------+---------------+---------+-----------+----------+--------------+ PERO     Full                                                        +---------+---------------+---------+-----------+----------+--------------+     Summary: RIGHT: - Findings consistent with acute deep vein thrombosis involving the right gastrocnemius veins. - No  cystic structure found in the popliteal fossa.  LEFT: - There is no evidence of deep vein thrombosis in the lower extremity.  - No cystic structure found in the popliteal fossa.  *See table(s) above for measurements and observations. Electronically signed by Deitra Mayo MD on 05/25/2021 at 6:43:30 PM.  Final    ECHOCARDIOGRAM LIMITED  Result Date: 05/18/2021    ECHOCARDIOGRAM LIMITED REPORT   Patient Name:   AVEYAH GREENWOOD Philippi Date of Exam: 05/18/2021 Medical Rec #:  [id number]         Height:       68.0 in Accession #:    [id number]        Weight:       135.0 lb Date of Birth:  1941-09-01          BSA:          1.729 m Patient Age:    31 years          BP:           105/47 mmHg Patient Gender: F                 HR:           83 bpm. Exam Location:  Inpatient Procedure: Limited Echo Indications:     Atrial Fibrillation I48.91  History:         Patient has no prior history of Echocardiogram examinations.                  Risk Factors:Hypertension.  Sonographer:     Darlina Sicilian RDCS Referring Phys:  [place] Diagnosing Phys: Adrian Prows MD  Sonographer Comments: Exam ended per patients request, the exam was causing too much pain. IMPRESSIONS  1. Grossly preserved LVEF by image 16. . Left ventricular endocardial border not optimally defined to evaluate regional wall motion. Left ventricular diastolic function could not be evaluated.  2. Right ventricular systolic function was not well visualized.  3. Grossly normal sized.  4. Grossly no pericardial efffusion.  5. The mitral valve was not well visualized. No evidence of mitral valve regurgitation.  6. The aortic valve was not well visualized. Conclusion(s)/Recommendation(s): Poor echo window, patient with flail chest and hydropneumothorax involving the right and difficulty image. Grossly left ventricular systolic function appears preserved and the left atrium appears grossly normal sized. FINDINGS  Left Ventricle: Grossly preserved LVEF by image 16. Left  ventricular endocardial border not optimally defined to evaluate regional wall motion. Left ventricular diastolic function could not be evaluated. Right Ventricle: Right ventricular systolic function was not well visualized. Left Atrium: Grossly normal sized. Left atrial size was not well visualized. Right Atrium: Right atrial size was not well visualized. Pericardium: Grossly no pericardial efffusion. The pericardium was not well visualized. Mitral Valve: The mitral valve was not well visualized. Tricuspid Valve: The tricuspid valve is not well visualized. Aortic Valve: The aortic valve was not well visualized. Pulmonic Valve: The pulmonic valve was not assessed. Aorta: The aortic root was not well visualized. Venous: The inferior vena cava was not well visualized. LEFT VENTRICLE PLAX 2D LVOT diam:     1.90 cm LVOT Area:     2.84 cm   AORTA Ao Root diam: 3.40 cm  SHUNTS Systemic Diam: 1.90 cm Adrian Prows MD Electronically signed by Adrian Prows MD Signature Date/Time: 05/18/2021/9:20:31 PM    Final    IR THORACENTESIS ASP PLEURAL SPACE W/IMG GUIDE  Result Date: 05/17/2021 INDICATION: Patient with right pleural effusion status post fall. Request is made for right therapeutic thoracentesis. EXAM: ULTRASOUND GUIDED THERAPEUTIC RIGHT THORACENTESIS MEDICATIONS: 10 ML 1% LIDOCAINE COMPLICATIONS: None immediate. PROCEDURE: An ultrasound guided thoracentesis was thoroughly discussed with the patient and questions answered. The benefits, risks, alternatives and complications were also discussed. The patient understands and  wishes to proceed with the procedure. Written consent was obtained. Ultrasound was performed to localize and mark an adequate pocket of fluid in the right chest. The area was then prepped and draped in the normal sterile fashion. 1% Lidocaine was used for local anesthesia. Under ultrasound guidance a 6 Fr Safe-T-Centesis catheter was introduced. Thoracentesis was performed. The catheter was removed and a  dressing applied. FINDINGS: A total of approximately 900 mL of bloody fluid was removed. IMPRESSION: Successful ultrasound guided therapeutic right thoracentesis yielding 900 mL of pleural fluid. Read by: Brynda Greathouse PA-C Electronically Signed   By: Ruthann Cancer MD   On: 05/17/2021 15:48    Labs:  CBC: Recent Labs    05/16/21 0258 05/18/21 0553 05/25/21 1356 05/26/21 0330  WBC 6.4 8.4 8.8 11.3*  HGB 10.4* 10.6* 11.1* 10.9*  HCT 30.7* 32.2* 34.5* 33.9*  PLT 314 392 403* 453*    COAGS: Recent Labs    05/11/21 0035  INR 1.0    BMP: Recent Labs    09/09/20 1222 03/30/21 1301 05/17/21 0921 05/18/21 0553 05/25/21 1356 05/26/21 0330  NA 136   < > 135 135 134* 134*  K 4.0   < > 3.3* 4.5 4.6 4.7  CL 99   < > 96* 93* 94* 90*  CO2 32   < > 32 35* 33* 34*  GLUCOSE 88   < > 106* 106* 111* 122*  BUN 18   < > 17 19 19 19   CALCIUM 9.9   < > 9.3 9.0 9.5 9.5  CREATININE 0.87   < > 0.69 0.77 0.72 0.66  GFRNONAA 62   < > >60 >60 >60 >60  GFRAA 72  --   --   --   --   --    < > = values in this interval not displayed.    LIVER FUNCTION TESTS: Recent Labs    03/30/21 1301 05/11/21 0035 05/25/21 1358 05/26/21 0330  BILITOT 0.5 0.5 0.6 0.4  AST 18 25 26 20   ALT 10 13 14 19   ALKPHOS 52 54 53 52  PROT 7.3 6.7 6.5 6.4*  ALBUMIN 4.4 3.6 3.2* 3.1*    TUMOR MARKERS: No results for input(s): AFPTM, CEA, CA199, CHROMGRNA in the last 8760 hours.  Assessment and Plan: Recurrent right hydroptx Imaging reviewed. Plan for perc chest tube placement, feel this would offer more complete evacuation of hydroptx with hopeful resolution upon removal. Pt understands and is agreeable. Risks and benefits discussed with the patient including bleeding, infection, damage to adjacent structures, lung perforation/fistula connection, and sepsis.  All of the patient's questions were answered, patient is agreeable to proceed. Consent signed and in chart.    Thank you for this interesting  consult.  I greatly enjoyed meeting Snow L Nevils and look forward to participating in their care.  A copy of this report was sent to the requesting provider on this date.  Electronically Signed: Ascencion Dike, PA-C 05/26/2021, 9:27 AM   I spent a total of 20 minutes in face to face in clinical consultation, greater than 50% of which was counseling/coordinating care for right chest tube

## 2021-05-26 NOTE — TOC CAGE-AID Note (Signed)
Transition of Care [facility]) - CAGE-AID Screening   Patient Details  Name: Melanie [name] MRN: [id number] Date of Birth: 22-Aug-1941  Transition of [facility]) CM/SW Contact:    Clovis Cao, RN Phone Number: [phone] 05/26/2021, 12:37 PM   Clinical Narrative: Pt was previously a trauma who presents with bilateral leg swelling.  She denies alcohol or drug use.   CAGE-AID Screening:    Have You Ever Felt You Ought to Cut Down on Your Drinking or Drug Use?: No Have People Annoyed You By Critizing Your Drinking Or Drug Use?: No Have You Felt Bad Or Guilty About Your Drinking Or Drug Use?: No Have You Ever Had a Drink or Used Drugs First Thing In The Morning to Steady Your Nerves or to Get Rid of a Hangover?: No CAGE-AID Score: 0  Substance Abuse Education Offered: No

## 2021-05-26 NOTE — Progress Notes (Signed)
PROGRESS NOTE    Melanie [name]  YSA:[id number] DOB: September 18, 1941 DOA: 05/25/2021 PCP: [place], Betty G, MD    Chief Complaint  Patient presents with  . Leg Swelling    Brief Narrative:   Melanie [name] is a 80 y.o. female with medical history significant of COPD, multinodular goiter, essential hypertension, anxiety disorder, overactive bladder, A. fib with RVR, recent traumatic hemopneumothorax presented in the ED with bilateral leg swelling.   Patient was recently admitted to the [facility] trauma service from 05/11/21- 05/20/21 for right-sided rib fractures with hemopneumothorax following fall from a bed.  During hospitalization patient was intubated and then extubated, She had a right pigtail drain placed and removed and underwent IR thoracocentesis for right pleural effusion.  During that admission Patient has also developed A. fib with RVR which was evaluated by cardiology Dr. Einar Gip and placed on metoprolol and Cardizem with recommendation for outpatient follow-up.  She also required supplemental oxygen during that admission and was discharged home on 3 L of oxygen. Daughter in law reports intermittent shortness of breath with exertion, and progressive increase in leg swelling after she is discharged from hospital.  Her oxygen requirement has increased to 4 L today. CT chest: No definite evidence of pulmonary embolus. Moderate right hydropneumothorax is noted with loculated effusionseen in the right lower lobe and associated atelectasis of the right lower lobe. Stable appearance of right segmental rib fractures and flail chest is noted as described on prior exam.     Assessment & Plan:   Principal Problem:   Atrial fibrillation with RVR (HCC) Active Problems:   Primary hypertension   Hypercholesteremia   Multinodular goiter   Overactive bladder   GERD (gastroesophageal reflux disease)   Peripheral neuropathy due to chemotherapy (HCC)   Anxiety disorder   Closed fracture of  multiple ribs with flail chest   Hydropneumothorax   Atrial fibrillation with RVR Rate controlled, she initially required IV Cardizem, transition to oral Cardizem and metoprolol today.  Patient was started on heparin GTT. Echocardiogram revealed preserved left ventricular ejection fraction.     Newly diagnosed right leg DVT Start the patient on IV heparin. Possible transition to [place] on discharge.    Right-sided hydropneumothorax with loculated effusions IR consulted for thoracentesis Patient underwent chest tube placement for further evacuation of the effusion. Trauma surgery on board and appreciate recommendations Continue with nasal cannula oxygen to keep sats greater than 90% and monitor output Pain control   Essential hypertension blood pressure parameters appear to be optimal    GERD Continue with Protonix   Anxiety Continue with the lorazepam.    Normocytic anemia Hemoglobin around 10.9 Continue to monitor    DVT prophylaxis: Heparin Code Status:  Full code Family Communication: family at bedside.  Disposition:   Status is: Inpatient  Remains inpatient appropriate because:Ongoing diagnostic testing needed not appropriate for outpatient work up and Unsafe d/c plan   Dispo: The patient is from: Home              Anticipated d/c is to: pending              Patient currently is not medically stable to d/c.   Difficult to place patient No       Consultants:   IR  Surgery    Procedures: chest tube placement.  Antimicrobials:none.   Subjective: Comfortable.   Objective: Vitals:   05/26/21 1133 05/26/21 1200 05/26/21 1203 05/26/21 1241  BP: 114/84  (!) 108/49 107/61  Pulse: 76  62 (!) 45  Resp:      Temp:      TempSrc:      SpO2: 95%  98% 93%  Weight:  59.9 kg    Height:        Intake/Output Summary (Last 24 hours) at 05/26/2021 1456 Last data filed at 05/26/2021 1126 Gross per 24 hour  Intake 634.57 ml  Output 2420 ml  Net  -1785.43 ml   Filed Weights   05/25/21 1745 05/26/21 1200  Weight: 61.2 kg 59.9 kg    Examination:  General exam: Appears calm and comfortable  Respiratory system: diminished air entry at bases, more on the right, on 4 lit of [place] oxygen. No wheezing or rhonchi.  Cardiovascular system: S1 & S2 heard, RRR. No JVD, murmurs, rubs, gallops or clicks. No pedal edema. Gastrointestinal system: Abdomen is nondistended, soft and nontender.Normal bowel sounds heard. Central nervous system: lethargic, not in distress.  Extremities: no pedal edema.  Skin: No rashes  Psychiatry: cannot be assessed.     Data Reviewed: I have personally reviewed following labs and imaging studies  CBC: Recent Labs  Lab 05/25/21 1356 05/26/21 0330  WBC 8.8 11.3*  NEUTROABS 7.5  --   HGB 11.1* 10.9*  HCT 34.5* 33.9*  MCV 95.0 94.4  PLT 403* 453*    Basic Metabolic Panel: Recent Labs  Lab 05/25/21 1356 05/26/21 0330  NA 134* 134*  K 4.6 4.7  CL 94* 90*  CO2 33* 34*  GLUCOSE 111* 122*  BUN 19 19  CREATININE 0.72 0.66  CALCIUM 9.5 9.5  MG  --  2.0  PHOS  --  3.9    GFR: Estimated Creatinine Clearance: 52.2 mL/min (by C-G formula based on SCr of 0.66 mg/dL).  Liver Function Tests: Recent Labs  Lab 05/25/21 1358 05/26/21 0330  AST 26 20  ALT 14 19  ALKPHOS 53 52  BILITOT 0.6 0.4  PROT 6.5 6.4*  ALBUMIN 3.2* 3.1*    CBG: No results for input(s): GLUCAP in the last 168 hours.   Recent Results (from the past 240 hour(s))  Resp Panel by RT-PCR (Flu A&B, Covid) Nasopharyngeal Swab     Status: None   Collection Time: 05/25/21  1:21 PM   Specimen: Nasopharyngeal Swab; Nasopharyngeal(NP) swabs in vial transport medium  Result Value Ref Range Status   SARS Coronavirus 2 by RT PCR NEGATIVE NEGATIVE Final    Comment: (NOTE) SARS-CoV-2 target nucleic acids are NOT DETECTED.  The SARS-CoV-2 RNA is generally detectable in upper respiratory specimens during the acute phase of infection. The  lowest concentration of SARS-CoV-2 viral copies this assay can detect is 138 copies/mL. A negative result does not preclude SARS-Cov-2 infection and should not be used as the sole basis for treatment or other patient management decisions. A negative result may occur with  improper specimen collection/handling, submission of specimen other than nasopharyngeal swab, presence of viral mutation(s) within the areas targeted by this assay, and inadequate number of viral copies(<138 copies/mL). A negative result must be combined with clinical observations, patient history, and epidemiological information. The expected result is Negative.  Fact Sheet for Patients:  EntrepreneurPulse.com.au  Fact Sheet for Healthcare Providers:  IncredibleEmployment.be  This test is no t yet approved or cleared by the [place] FDA and  has been authorized for detection and/or diagnosis of SARS-CoV-2 by FDA under an Emergency Use Authorization (EUA). This EUA will remain  in effect (meaning this test can be used) for the  duration of the COVID-19 declaration under Section 564(b)(1) of the Act, 21 U.S.C.section 360bbb-3(b)(1), unless the authorization is terminated  or revoked sooner.       Influenza A by PCR NEGATIVE NEGATIVE Final   Influenza B by PCR NEGATIVE NEGATIVE Final    Comment: (NOTE) The Xpert Xpress SARS-CoV-2/FLU/RSV plus assay is intended as an aid in the diagnosis of influenza from Nasopharyngeal swab specimens and should not be used as a sole basis for treatment. Nasal washings and aspirates are unacceptable for Xpert Xpress SARS-CoV-2/FLU/RSV testing.  Fact Sheet for Patients: EntrepreneurPulse.com.au  Fact Sheet for Healthcare Providers: IncredibleEmployment.be  This test is not yet approved or cleared by the [place] FDA and has been authorized for detection and/or diagnosis of SARS-CoV-2 by FDA under  an Emergency Use Authorization (EUA). This EUA will remain in effect (meaning this test can be used) for the duration of the COVID-19 declaration under Section 564(b)(1) of the Act, 21 U.S.C. section 360bbb-3(b)(1), unless the authorization is terminated or revoked.  Performed at [facility], [address], [place], [place] [address]          Radiology Studies: DG Chest 2 View  Addendum Date: 05/25/2021   ADDENDUM REPORT: 05/25/2021 13:45 ADDENDUM: Critical Value/emergent results were called by telephone at the time of interpretation on 05/25/2021 at 1:45 pm to provider Silverio Decamp, PA, who verbally acknowledged these results. Electronically Signed   By: Lowella Grip III M.D.   On: 05/25/2021 13:45   Result Date: 05/25/2021 CLINICAL DATA:  Shortness of breath EXAM: CHEST - 2 VIEW COMPARISON:  May 20, 2021. FINDINGS: There is a fairly small right apical pneumothorax without tension component. There are multiple displaced rib fractures on the right. There is a moderate pleural effusion on the right with atelectasis and potential consolidation in portions of the right middle and lower lobes. Left lung clear. Heart size and pulmonary vascularity are normal. No adenopathy. There is degenerative change in the thoracic spine. IMPRESSION: Multiple displaced rib fractures on the right with right pleural effusion and fairly small right apical pneumothorax. No tension component. Atelectasis with potential consolidation in portions of the right middle and lower lobes. Left lung clear. Heart size normal. Aortic Atherosclerosis (ICD10-I70.0). Electronically Signed: By: Lowella Grip III M.D. On: 05/25/2021 13:31   CT Angio Chest PE W/Cm &/Or Wo Cm  Result Date: 05/25/2021 CLINICAL DATA:  Shortness of breath. EXAM: CT ANGIOGRAPHY CHEST WITH CONTRAST TECHNIQUE: Multidetector CT imaging of the chest was performed using the standard protocol during bolus administration of  intravenous contrast. Multiplanar CT image reconstructions and MIPs were obtained to evaluate the vascular anatomy. CONTRAST:  43mL OMNIPAQUE IOHEXOL 350 MG/ML SOLN COMPARISON:  May 11, 2021. FINDINGS: Cardiovascular: Satisfactory opacification of the pulmonary arteries to the segmental level. No evidence of pulmonary embolism. Normal heart size. No pericardial effusion. Atherosclerosis of thoracic aorta is noted without aneurysm or dissection. Mediastinum/Nodes: Stable findings consistent with multinodular goiter. No adenopathy is noted. Esophagus is unremarkable. Lungs/Pleura: Left lung is clear. Moderate right hydropneumothorax is noted with loculated effusion seen in the right lower lobe and associated atelectasis of the right lower lobe. Upper Abdomen: No acute abnormality. Musculoskeletal: Stable appearance of right segmental rib fractures and flail chest as noted on prior exam. Review of the MIP images confirms the above findings. IMPRESSION: No definite evidence of pulmonary embolus. Moderate right hydropneumothorax is noted with loculated effusion seen in the right lower lobe and associated atelectasis of the right lower lobe.  Stable appearance of right segmental rib fractures and flail chest is noted as described on prior exam. Aortic Atherosclerosis (ICD10-I70.0). Electronically Signed   By: Marijo Conception M.D.   On: 05/25/2021 16:38   CT [facility] PLEURAL DRAIN W/INDWELL CATH W/IMG GUIDE  Result Date: 05/26/2021 INDICATION: 80 year old female with posttraumatic right hydropneumothorax. She has multiple severely displaced rib fractures and is not a candidate for percutaneous thoracentesis. She presents for chest tube placement. EXAM: Placement of chest tube with CT guidance MEDICATIONS: None ANESTHESIA/SEDATION: Fentanyl 37.5 mcg IV; Versed 0.5 mg IV Moderate Sedation Time:  11 minutes The patient was continuously monitored during the procedure by the interventional radiology nurse under my direct  supervision. COMPLICATIONS: None immediate. PROCEDURE: Informed written consent was obtained from the patient after a thorough discussion of the procedural risks, benefits and alternatives. All questions were addressed. Maximal Sterile Barrier Technique was utilized including caps, mask, sterile gowns, sterile gloves, sterile drape, hand hygiene and skin antiseptic. A timeout was performed prior to the initiation of the procedure. An axial CT scan was performed. The large right-sided hydropneumothorax was successfully identified. A suitable entry site over a rib along the right lateral chest wall inferior to the breast was identified. The skin entry site was marked and then the skin was sterilely prepped and draped in the standard fashion using chlorhexidine skin prep. Local anesthesia was attained by infiltration with 1% lidocaine. A small dermatotomy was made. Using trocar technique, a Cook 12 [place] all-purpose drainage catheter was advanced over the rib and positioned in the pleural fluid. The catheter was connected to low wall suction via a Pleur Evac. The catheter was secured to the skin with 0 Prolene suture. Sterile bandages were applied. Post placement CT scanning demonstrates a well-positioned chest tube in the dependent aspect of the pleural effusion. IMPRESSION: Successful placement of 12 French chest tube with CT guidance. Electronically Signed   By: Jacqulynn Cadet M.D.   On: 05/26/2021 11:11   VAS [place] LOWER EXTREMITY VENOUS (DVT) (ONLY [facility] & [facility] 7a-7p)  Result Date: 05/25/2021  Lower Venous DVT Study Patient Name:  Melanie [name]  Date of Exam:   05/25/2021 Medical Rec #: [id number]          Accession #:    [id number] Date of Birth: Nov 04, 1941           Patient Gender: F Patient Age:   79Y Exam Location:  [facility] Procedure:      VAS [place] LOWER EXTREMITY VENOUS (DVT) Referring Phys: [id number] [place]  --------------------------------------------------------------------------------  Indications: Swelling.  Risk Factors: None identified. Comparison Study: No prior studies. Performing Technologist: Oliver Hum RVT  Examination Guidelines: A complete evaluation includes B-mode imaging, spectral Doppler, color Doppler, and power Doppler as needed of all accessible portions of each vessel. Bilateral testing is considered an integral part of a complete examination. Limited examinations for reoccurring indications may be performed as noted. The reflux portion of the exam is performed with the patient in reverse Trendelenburg.  +---------+---------------+---------+-----------+----------+--------------+ RIGHT    CompressibilityPhasicitySpontaneityPropertiesThrombus Aging +---------+---------------+---------+-----------+----------+--------------+ CFV      Full           Yes      Yes                                 +---------+---------------+---------+-----------+----------+--------------+ SFJ      Full                                                        +---------+---------------+---------+-----------+----------+--------------+  FV Prox  Full                                                        +---------+---------------+---------+-----------+----------+--------------+ FV Mid   Full                                                        +---------+---------------+---------+-----------+----------+--------------+ FV DistalFull                                                        +---------+---------------+---------+-----------+----------+--------------+ PFV      Full                                                        +---------+---------------+---------+-----------+----------+--------------+ POP      Full           Yes      Yes                                 +---------+---------------+---------+-----------+----------+--------------+ PTV      Full                                                         +---------+---------------+---------+-----------+----------+--------------+ PERO     Full                                                        +---------+---------------+---------+-----------+----------+--------------+ Gastroc  Partial                                      Acute          +---------+---------------+---------+-----------+----------+--------------+   +---------+---------------+---------+-----------+----------+--------------+ LEFT     CompressibilityPhasicitySpontaneityPropertiesThrombus Aging +---------+---------------+---------+-----------+----------+--------------+ CFV      Full           Yes      Yes                                 +---------+---------------+---------+-----------+----------+--------------+ SFJ      Full                                                        +---------+---------------+---------+-----------+----------+--------------+  FV Prox  Full                                                        +---------+---------------+---------+-----------+----------+--------------+ FV Mid   Full                                                        +---------+---------------+---------+-----------+----------+--------------+ FV DistalFull                                                        +---------+---------------+---------+-----------+----------+--------------+ PFV      Full                                                        +---------+---------------+---------+-----------+----------+--------------+ POP      Full           Yes      Yes                                 +---------+---------------+---------+-----------+----------+--------------+ PTV      Full                                                        +---------+---------------+---------+-----------+----------+--------------+ PERO     Full                                                         +---------+---------------+---------+-----------+----------+--------------+     Summary: RIGHT: - Findings consistent with acute deep vein thrombosis involving the right gastrocnemius veins. - No cystic structure found in the popliteal fossa.  LEFT: - There is no evidence of deep vein thrombosis in the lower extremity.  - No cystic structure found in the popliteal fossa.  *See table(s) above for measurements and observations. Electronically signed by Deitra Mayo MD on 05/25/2021 at 6:43:30 PM.    Final         Scheduled Meds: . acetaminophen  1,000 mg Oral Q8H  . diltiazem  30 mg Oral Q6H  . feeding supplement  237 mL Oral TID BM  . losartan  100 mg Oral Daily  . metoprolol tartrate  50 mg Oral BID  .  morphine injection  2 mg Intravenous Once  . ondansetron (ZOFRAN) IV  4 mg Intravenous Once  . pantoprazole  40 mg Oral Daily   Continuous Infusions: . sodium chloride 50 mL/hr at 05/26/21 0445  . heparin  LOS: 1 day      Hosie Poisson, MD [facility]   To contact the attending provider between 7A-7P or the covering provider during after hours 7P-7A, please log into the web site [email or web address] and access using universal [place] password for that web site. If you do not have the password, please call the hospital operator.  05/26/2021, 2:56 PM

## 2021-05-27 ENCOUNTER — Inpatient Hospital Stay (HOSPITAL_COMMUNITY): Payer: Medicare Other

## 2021-05-27 DIAGNOSIS — E7800 Pure hypercholesterolemia, unspecified: Secondary | ICD-10-CM

## 2021-05-27 DIAGNOSIS — E440 Moderate protein-calorie malnutrition: Secondary | ICD-10-CM | POA: Diagnosis present

## 2021-05-27 LAB — CBC
HCT: 31.1 % — ABNORMAL LOW (ref 36.0–46.0)
Hemoglobin: 9.7 g/dL — ABNORMAL LOW (ref 12.0–15.0)
MCH: 29.9 pg (ref 26.0–34.0)
MCHC: 31.2 g/dL (ref 30.0–36.0)
MCV: 96 fL (ref 80.0–100.0)
Platelets: 376 10*3/uL (ref 150–400)
RBC: 3.24 MIL/uL — ABNORMAL LOW (ref 3.87–5.11)
RDW: 13.1 % (ref 11.5–15.5)
WBC: 8.2 10*3/uL (ref 4.0–10.5)
nRBC: 0 % (ref 0.0–0.2)

## 2021-05-27 LAB — HEPARIN LEVEL (UNFRACTIONATED): Heparin Unfractionated: 0.28 IU/mL — ABNORMAL LOW (ref 0.30–0.70)

## 2021-05-27 MED ORDER — ORAL CARE MOUTH RINSE
15.0000 mL | Freq: Two times a day (BID) | OROMUCOSAL | Status: DC
  Administered 2021-05-27 – 2021-06-07 (×15): 15 mL via OROMUCOSAL

## 2021-05-27 NOTE — Progress Notes (Signed)
Referring Physician(s): Dr. Karleen Hampshire, V./ Dr. Bobbye Morton, A.   Supervising Physician: Aletta Edouard  Patient Status:  [name] - In-pt  Chief Complaint:  S/p right chest tube placement with Dr. Laurence Ferrari on 6/1   Subjective:  Pt laying bed, not in acute distress.  No complaints regarding chest tube.   Allergies: Latex, Macrodantin, Guaifenesin er, Morphine and related, Ditropan [oxybutynin], Lipitor [atorvastatin], Lisinopril, Magnesium-containing compounds, Pravastatin, and Zoloft [sertraline hcl]  Medications: Prior to Admission medications   Medication Sig Start Date End Date Taking? Authorizing Provider  acetaminophen (TYLENOL) 650 MG CR tablet Take 650 mg by mouth every 8 (eight) hours as needed for fever or pain.   Yes [provider]  CALCIUM-VITAMIN D PO Take 1 tablet by mouth daily.   Yes [provider]  cetirizine (ZYRTEC) 10 MG tablet Take 0.5 tablets (5 mg total) by mouth daily. Patient taking differently: Take 10 mg by mouth daily. 09/09/20  Yes Martinique, Betty G, MD  diltiazem (CARDIZEM) 30 MG tablet Take 1 tablet (30 mg total) by mouth every 6 (six) hours. 05/20/21  Yes Maczis, Barth Kirks, PA-C  docusate sodium (COLACE) 100 MG capsule Take 1 capsule (100 mg total) by mouth 2 (two) times daily as needed for mild constipation. 05/20/21  Yes Maczis, Barth Kirks, PA-C  fluticasone (FLONASE) 50 MCG/ACT nasal spray Place 1 spray into both nostrils 2 (two) times daily. Patient taking differently: Place 1 spray into both nostrils 2 (two) times daily as needed for allergies. 11/05/19  Yes Martinique, Betty G, MD  hydrocortisone cream 1 % Apply 1 application topically 4 (four) times daily as needed for itching.   Yes [provider]  LORazepam (ATIVAN) 0.5 MG tablet Take 0.5 mg by mouth 2 (two) times daily as needed for anxiety. 04/30/21  Yes [provider]  metoprolol tartrate (LOPRESSOR) 50 MG tablet Take 1 tablet (50 mg total) by mouth 2 (two) times daily.  05/20/21  Yes Maczis, Barth Kirks, PA-C  oxyCODONE (OXY IR/ROXICODONE) 5 MG immediate release tablet Take 1 tablet (5 mg total) by mouth every 6 (six) hours as needed for breakthrough pain. 05/20/21  Yes Ileana Roup, MD  pantoprazole (PROTONIX) 20 MG tablet Take 20 mg by mouth daily. 05/10/21  Yes [provider]  polyvinyl alcohol (LIQUIFILM TEARS) 1.4 % ophthalmic solution Place 1 drop into both eyes as needed for dry eyes.   Yes [provider]  VITAMIN D PO Take 1 capsule by mouth daily.   Yes [provider]  acetaminophen (TYLENOL) 500 MG tablet Take 2 tablets (1,000 mg total) by mouth every 8 (eight) hours. Patient not taking: Reported on 05/25/2021 05/20/21   Jillyn Ledger, PA-C  LORazepam (ATIVAN) 0.5 MG tablet Take 0.5-1 tablets (0.25-0.5 mg total) by mouth 2 (two) times daily as needed. for anxiety Patient not taking: Reported on 05/25/2021 03/30/21   Martin[place], Betty G, MD  losartan (COZAAR) 100 MG tablet TAKE 1 TABLET BY MOUTH EVERY DAY Patient not taking: Reported on 05/25/2021 03/22/21   [place], Betty G, MD  pantoprazole (PROTONIX) 20 MG tablet TAKE 1 TABLET BY MOUTH 30 MINUTES BEFORE BREAKFAST. STOP OMEPRAZOLE Patient not taking: Reported on 05/25/2021 05/10/21   [place], Betty G, MD  polyethylene glycol (MIRALAX / GLYCOLAX) 17 g packet Take 17 g by mouth daily as needed. Patient not taking: Reported on 05/25/2021 05/20/21   Jillyn Ledger, PA-C     Vital Signs: BP (!) 119/59 (BP Location: Right Arm)  Pulse 86   Temp 98 F (36.7 C) (Oral)   Resp 16   Ht 5\' 8"  (1.727 m)   Wt 132 lb 0.9 oz (59.9 kg)   SpO2 98%   BMI 20.08 kg/m   Physical Exam Vitals reviewed.  Constitutional:      General: She is not in acute distress.    Appearance: She is ill-appearing.  HENT:     Head: Normocephalic and atraumatic.  Cardiovascular:     Rate and Rhythm: Normal rate.  Pulmonary:     Effort: Pulmonary effort is normal. No respiratory distress.      Comments: Crackles on lower lobes bilaterally. Skin:    General: Skin is warm and dry.     Coloration: Skin is not pale.     Comments: Positive right chest tube at right mid lateral chest. Site is unremarkable with no erythema, edema, tenderness, bleeding or drainage. Suture and stat lock in place. Dressing is clean, dry, and intact. 100 ml of clear yellow colored fluid noted in the PleurVac. Negative for air leak.   Neurological:     Mental Status: She is alert and oriented to person, place, and time.  Psychiatric:        Mood and Affect: Mood normal.        Behavior: Behavior normal.     Imaging: DG Chest 2 View  Addendum Date: 05/25/2021   ADDENDUM REPORT: 05/25/2021 13:45 ADDENDUM: Critical Value/emergent results were called by telephone at the time of interpretation on 05/25/2021 at 1:45 pm to provider Silverio Decamp, PA, who verbally acknowledged these results. Electronically Signed   By: Lowella Grip III M.D.   On: 05/25/2021 13:45   Result Date: 05/25/2021 CLINICAL DATA:  Shortness of breath EXAM: CHEST - 2 VIEW COMPARISON:  May 20, 2021. FINDINGS: There is a fairly small right apical pneumothorax without tension component. There are multiple displaced rib fractures on the right. There is a moderate pleural effusion on the right with atelectasis and potential consolidation in portions of the right middle and lower lobes. Left lung clear. Heart size and pulmonary vascularity are normal. No adenopathy. There is degenerative change in the thoracic spine. IMPRESSION: Multiple displaced rib fractures on the right with right pleural effusion and fairly small right apical pneumothorax. No tension component. Atelectasis with potential consolidation in portions of the right middle and lower lobes. Left lung clear. Heart size normal. Aortic Atherosclerosis (ICD10-I70.0). Electronically Signed: By: Lowella Grip III M.D. On: 05/25/2021 13:31   CT Angio Chest PE W/Cm &/Or Wo Cm  Result  Date: 05/25/2021 CLINICAL DATA:  Shortness of breath. EXAM: CT ANGIOGRAPHY CHEST WITH CONTRAST TECHNIQUE: Multidetector CT imaging of the chest was performed using the standard protocol during bolus administration of intravenous contrast. Multiplanar CT image reconstructions and MIPs were obtained to evaluate the vascular anatomy. CONTRAST:  2mL OMNIPAQUE IOHEXOL 350 MG/ML SOLN COMPARISON:  May 11, 2021. FINDINGS: Cardiovascular: Satisfactory opacification of the pulmonary arteries to the segmental level. No evidence of pulmonary embolism. Normal heart size. No pericardial effusion. Atherosclerosis of thoracic aorta is noted without aneurysm or dissection. Mediastinum/Nodes: Stable findings consistent with multinodular goiter. No adenopathy is noted. Esophagus is unremarkable. Lungs/Pleura: Left lung is clear. Moderate right hydropneumothorax is noted with loculated effusion seen in the right lower lobe and associated atelectasis of the right lower lobe. Upper Abdomen: No acute abnormality. Musculoskeletal: Stable appearance of right segmental rib fractures and flail chest as noted on prior exam. Review of the MIP images confirms  the above findings. IMPRESSION: No definite evidence of pulmonary embolus. Moderate right hydropneumothorax is noted with loculated effusion seen in the right lower lobe and associated atelectasis of the right lower lobe. Stable appearance of right segmental rib fractures and flail chest is noted as described on prior exam. Aortic Atherosclerosis (ICD10-I70.0). Electronically Signed   By: Marijo Conception M.D.   On: 05/25/2021 16:38   DG CHEST PORT 1 VIEW  Result Date: 05/27/2021 CLINICAL DATA:  Pneumothorax. EXAM: PORTABLE CHEST 1 VIEW COMPARISON:  May 25, 2021. FINDINGS: Stable cardiomediastinal silhouette. Interval placement of pigtail drainage catheter in the right lung base. Right pleural effusion is significantly smaller compared to prior exam. Grossly stable small right apical  pneumothorax is noted. Bony thorax is unremarkable. IMPRESSION: Interval placement of pigtail drainage catheter in right lung base. Right pleural effusion is significantly smaller. Grossly stable small right apical pneumothorax. Aortic Atherosclerosis (ICD10-I70.0). Electronically Signed   By: Marijo Conception M.D.   On: 05/27/2021 08:59   CT [name] PLEURAL DRAIN W/INDWELL CATH W/IMG GUIDE  Result Date: 05/26/2021 INDICATION: 80 year old female with posttraumatic right hydropneumothorax. She has multiple severely displaced rib fractures and is not a candidate for percutaneous thoracentesis. She presents for chest tube placement. EXAM: Placement of chest tube with CT guidance MEDICATIONS: None ANESTHESIA/SEDATION: Fentanyl 37.5 mcg IV; Versed 0.5 mg IV Moderate Sedation Time:  11 minutes The patient was continuously monitored during the procedure by the interventional radiology nurse under my direct supervision. COMPLICATIONS: None immediate. PROCEDURE: Informed written consent was obtained from the patient after a thorough discussion of the procedural risks, benefits and alternatives. All questions were addressed. Maximal Sterile Barrier Technique was utilized including caps, mask, sterile gowns, sterile gloves, sterile drape, hand hygiene and skin antiseptic. A timeout was performed prior to the initiation of the procedure. An axial CT scan was performed. The large right-sided hydropneumothorax was successfully identified. A suitable entry site over a rib along the right lateral chest wall inferior to the breast was identified. The skin entry site was marked and then the skin was sterilely prepped and draped in the standard fashion using chlorhexidine skin prep. Local anesthesia was attained by infiltration with 1% lidocaine. A small dermatotomy was made. Using trocar technique, a Cook 12 [place] all-purpose drainage catheter was advanced over the rib and positioned in the pleural fluid. The catheter was connected  to low wall suction via a Pleur Evac. The catheter was secured to the skin with 0 Prolene suture. Sterile bandages were applied. Post placement CT scanning demonstrates a well-positioned chest tube in the dependent aspect of the pleural effusion. IMPRESSION: Successful placement of 12 French chest tube with CT guidance. Electronically Signed   By: Jacqulynn Cadet M.D.   On: 05/26/2021 11:11   VAS [place] LOWER EXTREMITY VENOUS (DVT) (ONLY [facility] & [facility] 7a-7p)  Result Date: 05/25/2021  Lower Venous DVT Study Patient Name:  SKII CLELAND Mackiewicz  Date of Exam:   05/25/2021 Medical Rec #: [id number]          Accession #:    [id number] Date of Birth: Apr 02, 1941           Patient Gender: F Patient Age:   79Y Exam Location:  [name] Procedure:      VAS [place] LOWER EXTREMITY VENOUS (DVT) Referring Phys: [id number] [place] --------------------------------------------------------------------------------  Indications: Swelling.  Risk Factors: None identified. Comparison Study: No prior studies. Performing Technologist: Oliver Hum RVT  Examination Guidelines: A complete evaluation includes B-mode  imaging, spectral Doppler, color Doppler, and power Doppler as needed of all accessible portions of each vessel. Bilateral testing is considered an integral part of a complete examination. Limited examinations for reoccurring indications may be performed as noted. The reflux portion of the exam is performed with the patient in reverse Trendelenburg.  +---------+---------------+---------+-----------+----------+--------------+ RIGHT    CompressibilityPhasicitySpontaneityPropertiesThrombus Aging +---------+---------------+---------+-----------+----------+--------------+ CFV      Full           Yes      Yes                                 +---------+---------------+---------+-----------+----------+--------------+ SFJ      Full                                                         +---------+---------------+---------+-----------+----------+--------------+ FV Prox  Full                                                        +---------+---------------+---------+-----------+----------+--------------+ FV Mid   Full                                                        +---------+---------------+---------+-----------+----------+--------------+ FV DistalFull                                                        +---------+---------------+---------+-----------+----------+--------------+ PFV      Full                                                        +---------+---------------+---------+-----------+----------+--------------+ POP      Full           Yes      Yes                                 +---------+---------------+---------+-----------+----------+--------------+ PTV      Full                                                        +---------+---------------+---------+-----------+----------+--------------+ PERO     Full                                                        +---------+---------------+---------+-----------+----------+--------------+  Gastroc  Partial                                      Acute          +---------+---------------+---------+-----------+----------+--------------+   +---------+---------------+---------+-----------+----------+--------------+ LEFT     CompressibilityPhasicitySpontaneityPropertiesThrombus Aging +---------+---------------+---------+-----------+----------+--------------+ CFV      Full           Yes      Yes                                 +---------+---------------+---------+-----------+----------+--------------+ SFJ      Full                                                        +---------+---------------+---------+-----------+----------+--------------+ FV Prox  Full                                                         +---------+---------------+---------+-----------+----------+--------------+ FV Mid   Full                                                        +---------+---------------+---------+-----------+----------+--------------+ FV DistalFull                                                        +---------+---------------+---------+-----------+----------+--------------+ PFV      Full                                                        +---------+---------------+---------+-----------+----------+--------------+ POP      Full           Yes      Yes                                 +---------+---------------+---------+-----------+----------+--------------+ PTV      Full                                                        +---------+---------------+---------+-----------+----------+--------------+ PERO     Full                                                        +---------+---------------+---------+-----------+----------+--------------+  Summary: RIGHT: - Findings consistent with acute deep vein thrombosis involving the right gastrocnemius veins. - No cystic structure found in the popliteal fossa.  LEFT: - There is no evidence of deep vein thrombosis in the lower extremity.  - No cystic structure found in the popliteal fossa.  *See table(s) above for measurements and observations. Electronically signed by Deitra Mayo MD on 05/25/2021 at 6:43:30 PM.    Final     Labs:  CBC: Recent Labs    05/18/21 0553 05/25/21 1356 05/26/21 0330 05/27/21 0154  WBC 8.4 8.8 11.3* 8.2  HGB 10.6* 11.1* 10.9* 9.7*  HCT 32.2* 34.5* 33.9* 31.1*  PLT 392 403* 453* 376    COAGS: Recent Labs    05/11/21 0035  INR 1.0    BMP: Recent Labs    09/09/20 1222 03/30/21 1301 05/17/21 0921 05/18/21 0553 05/25/21 1356 05/26/21 0330  NA 136   < > 135 135 134* 134*  K 4.0   < > 3.3* 4.5 4.6 4.7  CL 99   < > 96* 93* 94* 90*  CO2 32   < > 32 35* 33* 34*  GLUCOSE 88   < >  106* 106* 111* 122*  BUN 18   < > 17 19 19 19   CALCIUM 9.9   < > 9.3 9.0 9.5 9.5  CREATININE 0.87   < > 0.69 0.77 0.72 0.66  GFRNONAA 62   < > >60 >60 >60 >60  GFRAA 72  --   --   --   --   --    < > = values in this interval not displayed.    LIVER FUNCTION TESTS: Recent Labs    03/30/21 1301 05/11/21 0035 05/25/21 1358 05/26/21 0330  BILITOT 0.5 0.5 0.6 0.4  AST 18 25 26 20   ALT 10 13 14 19   ALKPHOS 52 54 53 52  PROT 7.3 6.7 6.5 6.4*  ALBUMIN 4.4 3.6 3.2* 3.1*    Assessment and Plan:  80 y.o. female with hx of fall resulting in right rib fractures and hemoptx. Chest tube was placed by trauma team on 5/17 with subsequent removal. Hospital course complicated by recurrent pleural effusion, s/p thora with IR on 5/23. Repeat CT on 5/31 showed recurrent hydroptx. IR was requested for thora, CT was reviewed by Dr. Laurence Ferrari, who recommended a chest tube placement rather than repeat thoracentesis.  S/p right chest tube placement with Dr. Laurence Ferrari on 6/1   Patient stable VSS, O2 98% room air OP 2,025 cc  Ct intact, site unremarkable. Negative for air leak. CBC WBC 8.2 (11.3 yesterday)/ hgb 9.7 (10.9)/ plt 376 (453)  CXR on 6/2:  Interval placement of pigtail drainage catheter in right lung base. Right pleural effusion is significantly smaller. Grossly stable small right apical pneumothorax  Continue daily CXR and monitor OP.   CXR tomorrow AM order in.   Further treatment plan per Trauma/ TRH/  Appreciate and agree with the plan.  IR to follow.    Electronically Signed: Tera Mater, PA-C 05/27/2021, 9:52 AM   I spent a total of 25 Minutes at the the patient's bedside AND on the patient's hospital floor or unit, greater than 50% of which was counseling/coordinating care for right chest tube

## 2021-05-27 NOTE — Progress Notes (Signed)
[facility] Progress Note     Subjective: CC:  Reports pain from rib fractures continues to improve. States her SOB has improved since placement of chest tube. Tolerating PO.   Objective: Vital signs in last 24 hours: Temp:  [96.7 F (35.9 C)-98.1 F (36.7 C)] 98 F (36.7 C) (06/02 0432) Pulse Rate:  [45-109] 80 (06/02 0432) Resp:  [18-25] 18 (06/02 0432) BP: (107-155)/(44-96) 120/81 (06/02 0630) SpO2:  [85 %-100 %] 100 % (06/02 0432) Weight:  [59.9 kg] 59.9 kg (06/01 1200) Last BM Date: 05/26/21  Intake/Output from previous day: 06/01 0701 - 06/02 0700 In: 2091 [P.O.:600; I.V.:1491] Out: 3376 [Urine:500; Stool:1; Chest Tube:2025] Intake/Output this shift: No intake/output data recorded.  PE: Gen:  Alert, NAD, pleasant Card:  Regular rate. pedal pulses 2+ BL Pulm:  Normal effort, clear to auscultation bilaterally with diminished breath sounds BL lung bases, CT in place no air leak, Serous drainage - over 2L documented since chest tube placement but only 100-200cc in [place]. Abd: Soft, non-tender, non-distended, bowel sounds present in all 4 quadrants, no HSM Skin: warm and dry, no rashes  Psych: A&Ox3   Lab Results:  Recent Labs    05/26/21 0330 05/27/21 0154  WBC 11.3* 8.2  HGB 10.9* 9.7*  HCT 33.9* 31.1*  PLT 453* 376   BMET Recent Labs    05/25/21 1356 05/26/21 0330  NA 134* 134*  K 4.6 4.7  CL 94* 90*  CO2 33* 34*  GLUCOSE 111* 122*  BUN 19 19  CREATININE 0.72 0.66  CALCIUM 9.5 9.5   PT/INR No results for input(s): LABPROT, INR in the last 72 hours. CMP     Component Value Date/Time   NA 134 (L) 05/26/2021 0330   NA 139 03/08/2012 0801   K 4.7 05/26/2021 0330   K 4.4 03/08/2012 0801   CL 90 (L) 05/26/2021 0330   CL 94 (L) 03/08/2012 0801   CO2 34 (H) 05/26/2021 0330   CO2 30 03/08/2012 0801   GLUCOSE 122 (H) 05/26/2021 0330   GLUCOSE 97 03/08/2012 0801   BUN 19 05/26/2021 0330   BUN 17 03/08/2012 0801   CREATININE 0.66  05/26/2021 0330   CREATININE 0.87 09/09/2020 1222   CALCIUM 9.5 05/26/2021 0330   CALCIUM 9.6 03/08/2012 0801   PROT 6.4 (L) 05/26/2021 0330   PROT 7.2 03/10/2011 0902   ALBUMIN 3.1 (L) 05/26/2021 0330   ALBUMIN 3.8 03/10/2011 0902   AST 20 05/26/2021 0330   AST 27 03/10/2011 0902   ALT 19 05/26/2021 0330   ALT 23 03/10/2011 0902   ALKPHOS 52 05/26/2021 0330   ALKPHOS 58 03/10/2011 0902   BILITOT 0.4 05/26/2021 0330   BILITOT 0.60 03/10/2011 0902   GFRNONAA >60 05/26/2021 0330   GFRNONAA 62 09/09/2020 1222   GFRAA 72 09/09/2020 1222   Lipase  No results found for: LIPASE     Studies/Results: DG Chest 2 View  Addendum Date: 05/25/2021   ADDENDUM REPORT: 05/25/2021 13:45 ADDENDUM: Critical Value/emergent results were called by telephone at the time of interpretation on 05/25/2021 at 1:45 pm to provider Silverio Decamp, PA, who verbally acknowledged these results. Electronically Signed   By: Lowella Grip III M.D.   On: 05/25/2021 13:45   Result Date: 05/25/2021 CLINICAL DATA:  Shortness of breath EXAM: CHEST - 2 VIEW COMPARISON:  May 20, 2021. FINDINGS: There is a fairly small right apical pneumothorax without tension component. There are multiple displaced rib fractures on the right. There is  a moderate pleural effusion on the right with atelectasis and potential consolidation in portions of the right middle and lower lobes. Left lung clear. Heart size and pulmonary vascularity are normal. No adenopathy. There is degenerative change in the thoracic spine. IMPRESSION: Multiple displaced rib fractures on the right with right pleural effusion and fairly small right apical pneumothorax. No tension component. Atelectasis with potential consolidation in portions of the right middle and lower lobes. Left lung clear. Heart size normal. Aortic Atherosclerosis (ICD10-I70.0). Electronically Signed: By: Lowella Grip III M.D. On: 05/25/2021 13:31   CT Angio Chest PE W/Cm &/Or Wo  Cm  Result Date: 05/25/2021 CLINICAL DATA:  Shortness of breath. EXAM: CT ANGIOGRAPHY CHEST WITH CONTRAST TECHNIQUE: Multidetector CT imaging of the chest was performed using the standard protocol during bolus administration of intravenous contrast. Multiplanar CT image reconstructions and MIPs were obtained to evaluate the vascular anatomy. CONTRAST:  34mL OMNIPAQUE IOHEXOL 350 MG/ML SOLN COMPARISON:  May 11, 2021. FINDINGS: Cardiovascular: Satisfactory opacification of the pulmonary arteries to the segmental level. No evidence of pulmonary embolism. Normal heart size. No pericardial effusion. Atherosclerosis of thoracic aorta is noted without aneurysm or dissection. Mediastinum/Nodes: Stable findings consistent with multinodular goiter. No adenopathy is noted. Esophagus is unremarkable. Lungs/Pleura: Left lung is clear. Moderate right hydropneumothorax is noted with loculated effusion seen in the right lower lobe and associated atelectasis of the right lower lobe. Upper Abdomen: No acute abnormality. Musculoskeletal: Stable appearance of right segmental rib fractures and flail chest as noted on prior exam. Review of the MIP images confirms the above findings. IMPRESSION: No definite evidence of pulmonary embolus. Moderate right hydropneumothorax is noted with loculated effusion seen in the right lower lobe and associated atelectasis of the right lower lobe. Stable appearance of right segmental rib fractures and flail chest is noted as described on prior exam. Aortic Atherosclerosis (ICD10-I70.0). Electronically Signed   By: Marijo Conception M.D.   On: 05/25/2021 16:38   CT [facility] PLEURAL DRAIN W/INDWELL CATH W/IMG GUIDE  Result Date: 05/26/2021 INDICATION: 80 year old female with posttraumatic right hydropneumothorax. She has multiple severely displaced rib fractures and is not a candidate for percutaneous thoracentesis. She presents for chest tube placement. EXAM: Placement of chest tube with CT guidance  MEDICATIONS: None ANESTHESIA/SEDATION: Fentanyl 37.5 mcg IV; Versed 0.5 mg IV Moderate Sedation Time:  11 minutes The patient was continuously monitored during the procedure by the interventional radiology nurse under my direct supervision. COMPLICATIONS: None immediate. PROCEDURE: Informed written consent was obtained from the patient after a thorough discussion of the procedural risks, benefits and alternatives. All questions were addressed. Maximal Sterile Barrier Technique was utilized including caps, mask, sterile gowns, sterile gloves, sterile drape, hand hygiene and skin antiseptic. A timeout was performed prior to the initiation of the procedure. An axial CT scan was performed. The large right-sided hydropneumothorax was successfully identified. A suitable entry site over a rib along the right lateral chest wall inferior to the breast was identified. The skin entry site was marked and then the skin was sterilely prepped and draped in the standard fashion using chlorhexidine skin prep. Local anesthesia was attained by infiltration with 1% lidocaine. A small dermatotomy was made. Using trocar technique, a Cook 12 [place] all-purpose drainage catheter was advanced over the rib and positioned in the pleural fluid. The catheter was connected to low wall suction via a Pleur Evac. The catheter was secured to the skin with 0 Prolene suture. Sterile bandages were applied. Post placement CT scanning demonstrates  a well-positioned chest tube in the dependent aspect of the pleural effusion. IMPRESSION: Successful placement of 12 French chest tube with CT guidance. Electronically Signed   By: Jacqulynn Cadet M.D.   On: 05/26/2021 11:11   VAS [place] LOWER EXTREMITY VENOUS (DVT) (ONLY [facility] & [facility] 7a-7p)  Result Date: 05/25/2021  Lower Venous DVT Study Patient Name:  EMAYA PRESTON Vaeth  Date of Exam:   05/25/2021 Medical Rec #: [id number]          Accession #:    [id number] Date of Birth: [birth year]           Patient Gender: F  Patient Age:   79Y Exam Location:  [facility] Procedure:      VAS [place] LOWER EXTREMITY VENOUS (DVT) Referring Phys: [id number] [place] --------------------------------------------------------------------------------  Indications: Swelling.  Risk Factors: None identified. Comparison Study: No prior studies. Performing Technologist: Oliver Hum RVT  Examination Guidelines: A complete evaluation includes B-mode imaging, spectral Doppler, color Doppler, and power Doppler as needed of all accessible portions of each vessel. Bilateral testing is considered an integral part of a complete examination. Limited examinations for reoccurring indications may be performed as noted. The reflux portion of the exam is performed with the patient in reverse Trendelenburg.  +---------+---------------+---------+-----------+----------+--------------+ RIGHT    CompressibilityPhasicitySpontaneityPropertiesThrombus Aging +---------+---------------+---------+-----------+----------+--------------+ CFV      Full           Yes      Yes                                 +---------+---------------+---------+-----------+----------+--------------+ SFJ      Full                                                        +---------+---------------+---------+-----------+----------+--------------+ FV Prox  Full                                                        +---------+---------------+---------+-----------+----------+--------------+ FV Mid   Full                                                        +---------+---------------+---------+-----------+----------+--------------+ FV DistalFull                                                        +---------+---------------+---------+-----------+----------+--------------+ PFV      Full                                                        +---------+---------------+---------+-----------+----------+--------------+ POP      Full  Yes      Yes                                 +---------+---------------+---------+-----------+----------+--------------+ PTV      Full                                                        +---------+---------------+---------+-----------+----------+--------------+ PERO     Full                                                        +---------+---------------+---------+-----------+----------+--------------+ Gastroc  Partial                                      Acute          +---------+---------------+---------+-----------+----------+--------------+   +---------+---------------+---------+-----------+----------+--------------+ LEFT     CompressibilityPhasicitySpontaneityPropertiesThrombus Aging +---------+---------------+---------+-----------+----------+--------------+ CFV      Full           Yes      Yes                                 +---------+---------------+---------+-----------+----------+--------------+ SFJ      Full                                                        +---------+---------------+---------+-----------+----------+--------------+ FV Prox  Full                                                        +---------+---------------+---------+-----------+----------+--------------+ FV Mid   Full                                                        +---------+---------------+---------+-----------+----------+--------------+ FV DistalFull                                                        +---------+---------------+---------+-----------+----------+--------------+ PFV      Full                                                        +---------+---------------+---------+-----------+----------+--------------+ POP      Full  Yes      Yes                                 +---------+---------------+---------+-----------+----------+--------------+ PTV      Full                                                         +---------+---------------+---------+-----------+----------+--------------+ PERO     Full                                                        +---------+---------------+---------+-----------+----------+--------------+     Summary: RIGHT: - Findings consistent with acute deep vein thrombosis involving the right gastrocnemius veins. - No cystic structure found in the popliteal fossa.  LEFT: - There is no evidence of deep vein thrombosis in the lower extremity.  - No cystic structure found in the popliteal fossa.  *See table(s) above for measurements and observations. Electronically signed by Deitra Mayo MD on 05/25/2021 at 6:43:30 PM.    Final     Anti-infectives: Anti-infectives (From admission, onward)   None     Assessment/Plan 69F readmission with AF-RVR and DVT   AFRVR - rate controlled. Off Cardizem gtt and on PO Cardizem now. Cards following DVT - heparin gtt, started by primary, CTA chest negative for PE  Hydropneumothorax- s/p placement 58F chest tube by IR 05/26/21, >2L/24h serous. Pulm toileting.  FEN - Regular DVT - heparin gtt Dispo - per primary team  Patient s/p fall out of bed 05/11/21 resulting in rib fractures, hemopneumothorax and hospital admission 5/17-5/27/2022. Now re-admitted with afib RVR and hydropneumothorax. Her small PTX is stable and her chest tube output is serous. Management of chest tube per IR. No new trauma needs identified, we will sign off. Please call as needed. Patient is scheduled for follow up in our office 06/11/19.     LOS: 2 days    Melanie [name], [facility] Please see Amion for pager number during day hours 7:00am-4:30pm

## 2021-05-27 NOTE — Progress Notes (Signed)
Subjective:  Patient seen and examined at bedside at approximately 7:45am, no family present at bedside. Patient had pulled IV out and IV team was working to place new one.  Patient more somnolent and mildly confused this morning.  Reports shortness of breath improved compared to yesterday. Denies chest pain or palpitations. Patient has been transitioned to diltiazem 30 mg PO every 6 hours and continues metoprolol tartrate 50 mg PO BID.  She remains on heparin infusion both for DVT and atrial fibrillation.  Right sided chest tube was placed by interventional radiology yesterday, repeat chest x-ray pending   Intake/Output from previous day:  I/O last 3 completed shifts: In: 2725.5 [P.O.:600; I.V.:2125.5] Out: 7672 [Urine:500; Other:850; Stool:1; Chest Tube:2025] No intake/output data recorded.  Blood pressure (!) 119/59, pulse 86, temperature 98 F (36.7 C), temperature source Oral, resp. rate 16, height 5' 8"  (1.727 m), weight 59.9 kg, SpO2 98 %. Physical Exam Vitals and nursing note reviewed.  Constitutional:      General: She is not in acute distress.    Appearance: She is ill-appearing.     Comments: Appears more lethargic compared to yesterday.  HENT:     Head: Normocephalic and atraumatic.  Cardiovascular:     Rate and Rhythm: Normal rate. Rhythm irregularly irregular.     Pulses: Intact distal pulses.     Heart sounds: S1 normal and S2 normal. No murmur heard. No gallop.   Pulmonary:     Effort: Pulmonary effort is normal. No respiratory distress.     Breath sounds: No wheezing, rhonchi or rales.  Chest:     Comments: Right sided chest tube in place Musculoskeletal:     Right lower leg: Edema (trace ankle) present.     Left lower leg: Edema (trace ankle) present.  Skin:    General: Skin is warm and dry.     Lab Results: BMP BNP (last 3 results) Recent Labs    05/25/21 1358  BNP 480.6*    ProBNP (last 3 results) Recent Labs    03/30/21 1301  PROBNP 97.0    BMP Latest Ref Rng & Units 05/26/2021 05/25/2021 05/18/2021  Glucose 70 - 99 mg/dL 122(H) 111(H) 106(H)  BUN 8 - 23 mg/dL 19 19 19   Creatinine 0.44 - 1.00 mg/dL 0.66 0.72 0.77  BUN/Creat Ratio 6 - 22 (calc) - - -  Sodium 135 - 145 mmol/L 134(L) 134(L) 135  Potassium 3.5 - 5.1 mmol/L 4.7 4.6 4.5  Chloride 98 - 111 mmol/L 90(L) 94(L) 93(L)  CO2 22 - 32 mmol/L 34(H) 33(H) 35(H)  Calcium 8.9 - 10.3 mg/dL 9.5 9.5 9.0   Hepatic Function Latest Ref Rng & Units 05/26/2021 05/25/2021 05/11/2021  Total Protein 6.5 - 8.1 g/dL 6.4(L) 6.5 6.7  Albumin 3.5 - 5.0 g/dL 3.1(L) 3.2(L) 3.6  AST 15 - 41 U/L 20 26 25   ALT 0 - 44 U/L 19 14 13   Alk Phosphatase 38 - 126 U/L 52 53 54  Total Bilirubin 0.3 - 1.2 mg/dL 0.4 0.6 0.5  Bilirubin, Direct 0.0 - 0.2 mg/dL - 0.2 -   CBC Latest Ref Rng & Units 05/27/2021 05/26/2021 05/25/2021  WBC 4.0 - 10.5 K/uL 8.2 11.3(H) 8.8  Hemoglobin 12.0 - 15.0 g/dL 9.7(L) 10.9(L) 11.1(L)  Hematocrit 36.0 - 46.0 % 31.1(L) 33.9(L) 34.5(L)  Platelets 150 - 400 K/uL 376 453(H) 403(H)   Lipid Panel  No results found for: CHOL, TRIG, HDL, CHOLHDL, VLDL, LDLCALC, LDLDIRECT Cardiac Panel (last 3 results) No results for input(s): CKTOTAL,  CKMB, TROPONINI, RELINDX in the last 72 hours.  HEMOGLOBIN A1C No results found for: HGBA1C, MPG TSH Recent Labs    03/30/21 1301 05/18/21 1322  TSH 0.57 0.726   Imaging: DG Chest 2 View 05/25/2021  FINDINGS: There is a fairly small right apical pneumothorax without tension component. There are multiple displaced rib fractures on the right. There is a moderate pleural effusion on the right with atelectasis and potential consolidation in portions of the right middle and lower lobes. Left lung clear. Heart size and pulmonary vascularity are normal. No adenopathy. There is degenerative change in the thoracic spine. IMPRESSION: Multiple displaced rib fractures on the right with right pleural effusion and fairly small right apical pneumothorax. No tension  component. Atelectasis with potential consolidation in portions of the right middle and lower lobes. Left lung clear. Heart size normal. Aortic Atherosclerosis (ICD10-I70.0).   CT Angio Chest PE W/Cm &/Or Wo Cm 05/25/2021 IMPRESSION: No definite evidence of pulmonary embolus. Moderate right hydropneumothorax is noted with loculated effusion seen in the right lower lobe and associated atelectasis of the right lower lobe. Stable appearance of right segmental rib fractures and flail chest is noted as described on prior exam. Aortic Atherosclerosis   VAS [place] LOWER EXTREMITY VENOUS DVT 05/25/2021 RIGHT: - Findings consistent with acute deep vein thrombosis involving the right gastrocnemius veins. - No cystic structure found in the popliteal fossa.   LEFT: - There is no evidence of deep vein thrombosis in the lower extremity.  - No cystic structure found in the popliteal fossa.   Cardiac Studies: Echocardiogram 05/18/2021:  1. Grossly preserved LVEF by image 16. . Left ventricular endocardial  border not optimally defined to evaluate regional wall motion. Left  ventricular diastolic function could not be evaluated.  2. Right ventricular systolic function was not well visualized.  3. Grossly normal sized.  4. Grossly no pericardial efffusion.  5. The mitral valve was not well visualized. No evidence of mitral valve  regurgitation.  6. The aortic valve was not well visualized.  Conclusion(s)/Recommendation(s): Poor echo window, patient with flail  chest and hydropneumothorax involving the right and difficulty image.  Grossly left ventricular systolic function appears preserved and the left  atrium appears grossly normal sized.   Telemetry:  Atrial fibrillation with relatively controlled ventricular response  EKG:  05/25/2021: Atrial fibrillation with rapid ventricular response at a rate of 110 bpm.  No evidence of ischemia or underlying injury pattern.  No results found for this or any  previous visit (from the past 43800 hour(s)).  Scheduled Meds: . acetaminophen  1,000 mg Oral Q8H  . diltiazem  30 mg Oral Q6H  . feeding supplement  237 mL Oral TID BM  . losartan  100 mg Oral Daily  . mouth rinse  15 mL Mouth Rinse BID  . metoprolol tartrate  50 mg Oral BID  .  morphine injection  2 mg Intravenous Once  . ondansetron (ZOFRAN) IV  4 mg Intravenous Once  . pantoprazole  40 mg Oral Daily   Continuous Infusions: . sodium chloride 50 mL/hr at 05/26/21 0445  . heparin 1,100 Units/hr (05/27/21 0859)   PRN Meds:.docusate sodium, LORazepam, ondansetron **OR** ondansetron (ZOFRAN) IV, oxyCODONE  Assessment/Plan:  Melanie [name] 80 y.o. Caucasian female with hypertension, COPD, recurrent traumatic hydropneumothorax, new acute DVT, A. fib with RVR.  This patients CHA2DS2-VASc Score 3 (age, HTN) and yearly risk of stroke 3.2%.  Atrial fibrillation with rapid ventricular response This patients CHA2DS2-VASc Score 3 (age, HTN)  and yearly risk of stroke 3.2%. Preserved LVEF on echocardiogram 05/18/2021.  Patient is well rate controlled, continue PO Lopressor and diltiazem.  Heparin is reasonable during hospitalization  In regard to long-tern anticoagulation, if bleeding risk is acceptable from the perspective of the IR team following removal of chest tube would recommend DVT dosing of DOAC upon discharge given DVT and  high CHA2DS2-VASc score. If patient is not able to tolerate anticoagulation for any length of time she would not be candidate for watchman device or IVC filter.  This will need to be shared decision, but would transition to long-term DOAC if IR team as well as patient and family are amenable. Will plan to follow up outpatient.    Right leg DVT  Continue heparin, will consider transition to long-term oral anticoagulation at discharged pending discussion with surgical team, patient, and family.  Will defer further management to primary team.  Primary  hypertension  Well controlled. Continue Lopressor, diltiazem, losartan  Hydropneumothorax  Right sided chest tube place yesterday. Will defer further management to surgical and interventional radiology team. Suspect hydropneumothorax to be underlying trigger for atrial fibrillation with RVR.  Patient is well rate controled from atrial fibrillation standpoint. Will continue to follow peripherally during this hospitalization. Please reach out if additional questions or concerns arise.   Patient was seen in collaboration with Dr. Virgina Jock. He also reviewed patient's chart and examined the patient. Dr. Virgina Jock is in agreement of the plan.    Alethia Berthold, PA-C 05/27/2021, 11:05 AM Office: [phone]

## 2021-05-27 NOTE — Progress Notes (Signed)
[facility] for heparin Indication: acute  DVT, afib  Allergies  Allergen Reactions  . Latex Rash  . Macrodantin Shortness Of Breath  . Guaifenesin Er Itching  . Morphine And Related Nausea Only  . Ditropan [Oxybutynin]     Causes diarrhea - contains Magnesium  . Lipitor [Atorvastatin]     rash  . Lisinopril     Diarrhea/cough  . Magnesium-Containing Compounds     Causes diarrhea  . Pravastatin     Leg cramps  . Zoloft [Sertraline Hcl]     Diarrhea    Patient Measurements: Height: 5\' 8"  (172.7 cm) Weight: 59.9 kg (132 lb 0.9 oz) IBW/kg (Calculated) : 63.9 Heparin Dosing Weight: 61 kg  Vital Signs: Temp: 98 F (36.7 C) (06/02 0432) Temp Source: Oral (06/02 0432) BP: 119/59 (06/02 0752) Pulse Rate: 86 (06/02 0752)  Labs: Recent Labs    05/25/21 1356 05/26/21 0330 05/27/21 0154  HGB 11.1* 10.9* 9.7*  HCT 34.5* 33.9* 31.1*  PLT 403* 453* 376  HEPARINUNFRC  --  0.33 0.28*  CREATININE 0.72 0.66  --     Estimated Creatinine Clearance: 52.2 mL/min (by C-G formula based on SCr of 0.66 mg/dL).   Assessment: Patient is an 80 y.o F who was hospitalized a week prior to this admission for fall with right rib fractures and hemopneumothorax, presented to the ED on 5/31 with c/o LE swelling and SOB.  LE doppler showed "acute deep vein thrombosis involving the right  gastrocnemius veins."  Pharmacy has been consulted to dose heparin for acute DVT and afib.   Heparin paused for chest tube yesterday then resume later in the evening. Heparin level this am at low end just below goal. Hgb still trending down from 11>9.7, plt count normal. No overt bleeding issues noted. Will titrate up heparin to goal   Goal of Therapy:  Heparin level 0.3-0.7 units/ml Monitor platelets by anticoagulation protocol: Yes   Plan:  Increase heparin to 1200 units/hr - monitor for s/sx bleeding - Daily heparin level and cbc  Erin Hearing PharmD.,  BCPS Clinical Pharmacist 05/27/2021 1:59 PM

## 2021-05-27 NOTE — Progress Notes (Signed)
Into pt room at 0630 to give cardizem, found that pt had pulled out IV since last contact and approximately 0600. Site clean, dry, and intact; put in consult for IV team since was only IV and had heparin infusing. Rowland Lathe, [place] 06.02.22 [phone]

## 2021-05-27 NOTE — Care Management (Signed)
05-27-21 Benefits check submitted for Xarelto and Eliquis. Case Manager will follow for cost.

## 2021-05-27 NOTE — TOC Benefit Eligibility Note (Signed)
Transition of Care [facility]) Benefit Eligibility Note    Patient Details  Name: MAYLEEN BORRERO MRN: [id number] Date of Birth: 13-Jun-1941   Medication/Dose: Xarelto 20mg .daily and Eliquis 5mg . bid for 30 day supply  Covered?: Yes  Tier: 3 Drug  Prescription Coverage [facility]erred Pharm[facility]  Spoke with Person/Company/Phone Number:: Kisha B. [facility]  [phone]  Co-Pay: $37.00  Prior Approval: No  Deductible: Unmet       [name] Phone Number: 05/27/2021, 3:34 PM

## 2021-05-27 NOTE — Progress Notes (Signed)
PROGRESS NOTE    Melanie [name]  ZOX:[id number] DOB: [birth year] DOA: 05/25/2021 PCP: [place], Betty G, MD    Chief Complaint  Patient presents with  . Leg Swelling    Brief Narrative:   Melanie [name] is a 80 y.o. female with medical history significant of COPD, multinodular goiter, essential hypertension, anxiety disorder, overactive bladder, A. fib with RVR, recent traumatic hemopneumothorax presented in the ED with bilateral leg swelling.   Patient was recently admitted to the [facility] trauma service from 05/11/21- 05/20/21 for right-sided rib fractures with hemopneumothorax following fall from a bed.  During hospitalization patient was intubated and then extubated, She had a right pigtail drain placed and removed and underwent IR thoracocentesis for right pleural effusion.  During that admission Patient has also developed A. fib with RVR which was evaluated by cardiology Dr. Einar Gip and placed on metoprolol and Cardizem with recommendation for outpatient follow-up.  She also required supplemental oxygen during that admission and was discharged home on 3 L of oxygen. Daughter in law reports intermittent shortness of breath with exertion, and progressive increase in leg swelling after she is discharged from hospital.  Her oxygen requirement has increased to 4 L today. CT chest: No definite evidence of pulmonary embolus. Moderate right hydropneumothorax is noted with loculated effusionseen in the right lower lobe and associated atelectasis of the right lower lobe. Stable appearance of right segmental rib fractures and flail chest is noted as described on prior exam.     Assessment & Plan:   Principal Problem:   Atrial fibrillation with RVR (HCC) Active Problems:   Primary hypertension   Hypercholesteremia   Multinodular goiter   Overactive bladder   GERD (gastroesophageal reflux disease)   Peripheral neuropathy due to chemotherapy (HCC)   Anxiety disorder   Closed fracture of  multiple ribs with flail chest   Hydropneumothorax   Malnutrition of moderate degree   Atrial fibrillation with RVR Rate controlled, she initially required IV Cardizem, transition to oral Cardizem and metoprolol today.  Patient was started on heparin GTT. Possible transition to oral Eliquis on discharge.  Echocardiogram revealed preserved left ventricular ejection fraction.     Newly diagnosed right leg DVT Start the patient on IV heparin, possibly transition to Eliquis on discharge.      Right-sided hydropneumothorax with loculated effusions IR consulted for thoracentesis Patient underwent chest tube placement for further evacuation of the effusion. Trauma surgery on board and appreciate recommendations.  Interval placement of pigtail drainage catheter in right lung base. RIGHT sided effusion improving, and small apical  pneumothorax on CXR. Continue with nasal cannula oxygen to keep sats greater than 90% and monitor output Pain control   Essential hypertension Well controlled BP parameters.     GERD Continue with Protonix   Anxiety Continue with the lorazepam.    Normocytic anemia Baseline hemoglobin is around 12, drop in hemoglobin to 9.7. transfuse to keep hemoglobin greater than 7.  Continue to monitor    DVT prophylaxis: Heparin Code Status:  Full code Family Communication: none at bedside.  Disposition:   Status is: Inpatient  Remains inpatient appropriate because:Ongoing diagnostic testing needed not appropriate for outpatient work up and Unsafe d/c plan   Dispo: The patient is from: Home              Anticipated d/c is to: pending              Patient currently is not medically stable to d/c.   Difficult  to place patient No       Consultants:   IR  Surgery    Procedures: chest tube placement.  Antimicrobials:none.   Subjective: No new complaints. On 4 lit of [place] oxygen.   Objective: Vitals:   05/27/21 0015 05/27/21 0432  05/27/21 0630 05/27/21 0752  BP: 114/65 (!) 123/53 120/81 (!) 119/59  Pulse: (!) 105 80  86  Resp: 18 18  16   Temp: 98.1 F (36.7 C) 98 F (36.7 C)    TempSrc: Oral Oral    SpO2: 100% 100%  98%  Weight:      Height:        Intake/Output Summary (Last 24 hours) at 05/27/2021 1503 Last data filed at 05/27/2021 1501 Gross per 24 hour  Intake 2299.53 ml  Output 866 ml  Net 1433.53 ml   Filed Weights   05/25/21 1745 05/26/21 1200  Weight: 61.2 kg 59.9 kg    Examination:  General exam: alert and oriented, non focal.  Respiratory system: diminished air entry on the right, on 4l it of [place] oxygen.  Cardiovascular system: S1 & S2 heard, irregularly irregular, no JVD, no pedal edema.  Gastrointestinal system: Abdomen is soft, NT ND BS+ Central nervous system: Alert and answering simple questions.  Extremities: no pedal edema.  Skin: No rashes seen.  Psychiatry: mood is appropriate.     Data Reviewed: I have personally reviewed following labs and imaging studies  CBC: Recent Labs  Lab 05/25/21 1356 05/26/21 0330 05/27/21 0154  WBC 8.8 11.3* 8.2  NEUTROABS 7.5  --   --   HGB 11.1* 10.9* 9.7*  HCT 34.5* 33.9* 31.1*  MCV 95.0 94.4 96.0  PLT 403* 453* 595    Basic Metabolic Panel: Recent Labs  Lab 05/25/21 1356 05/26/21 0330  NA 134* 134*  K 4.6 4.7  CL 94* 90*  CO2 33* 34*  GLUCOSE 111* 122*  BUN 19 19  CREATININE 0.72 0.66  CALCIUM 9.5 9.5  MG  --  2.0  PHOS  --  3.9    GFR: Estimated Creatinine Clearance: 52.2 mL/min (by C-G formula based on SCr of 0.66 mg/dL).  Liver Function Tests: Recent Labs  Lab 05/25/21 1358 05/26/21 0330  AST 26 20  ALT 14 19  ALKPHOS 53 52  BILITOT 0.6 0.4  PROT 6.5 6.4*  ALBUMIN 3.2* 3.1*    CBG: No results for input(s): GLUCAP in the last 168 hours.   Recent Results (from the past 240 hour(s))  Resp Panel by RT-PCR (Flu A&B, Covid) Nasopharyngeal Swab     Status: None   Collection Time: 05/25/21  1:21 PM    Specimen: Nasopharyngeal Swab; Nasopharyngeal(NP) swabs in vial transport medium  Result Value Ref Range Status   SARS Coronavirus 2 by RT PCR NEGATIVE NEGATIVE Final    Comment: (NOTE) SARS-CoV-2 target nucleic acids are NOT DETECTED.  The SARS-CoV-2 RNA is generally detectable in upper respiratory specimens during the acute phase of infection. The lowest concentration of SARS-CoV-2 viral copies this assay can detect is 138 copies/mL. A negative result does not preclude SARS-Cov-2 infection and should not be used as the sole basis for treatment or other patient management decisions. A negative result may occur with  improper specimen collection/handling, submission of specimen other than nasopharyngeal swab, presence of viral mutation(s) within the areas targeted by this assay, and inadequate number of viral copies(<138 copies/mL). A negative result must be combined with clinical observations, patient history, and epidemiological information. The expected result is  Negative.  Fact Sheet for Patients:  EntrepreneurPulse.com.au  Fact Sheet for Healthcare Providers:  IncredibleEmployment.be  This test is no t yet approved or cleared by the [place] FDA and  has been authorized for detection and/or diagnosis of SARS-CoV-2 by FDA under an Emergency Use Authorization (EUA). This EUA will remain  in effect (meaning this test can be used) for the duration of the COVID-19 declaration under Section 564(b)(1) of the Act, 21 U.S.C.section 360bbb-3(b)(1), unless the authorization is terminated  or revoked sooner.       Influenza A by PCR NEGATIVE NEGATIVE Final   Influenza B by PCR NEGATIVE NEGATIVE Final    Comment: (NOTE) The Xpert Xpress SARS-CoV-2/FLU/RSV plus assay is intended as an aid in the diagnosis of influenza from Nasopharyngeal swab specimens and should not be used as a sole basis for treatment. Nasal washings and aspirates are  unacceptable for Xpert Xpress SARS-CoV-2/FLU/RSV testing.  Fact Sheet for Patients: EntrepreneurPulse.com.au  Fact Sheet for Healthcare Providers: IncredibleEmployment.be  This test is not yet approved or cleared by the [place] FDA and has been authorized for detection and/or diagnosis of SARS-CoV-2 by FDA under an Emergency Use Authorization (EUA). This EUA will remain in effect (meaning this test can be used) for the duration of the COVID-19 declaration under Section 564(b)(1) of the Act, 21 U.S.C. section 360bbb-3(b)(1), unless the authorization is terminated or revoked.  Performed at [facility], [place] [address], [place],  [address]          Radiology Studies: CT Angio Chest PE W/Cm &/Or Wo Cm  Result Date: 05/25/2021 CLINICAL DATA:  Shortness of breath. EXAM: CT ANGIOGRAPHY CHEST WITH CONTRAST TECHNIQUE: Multidetector CT imaging of the chest was performed using the standard protocol during bolus administration of intravenous contrast. Multiplanar CT image reconstructions and MIPs were obtained to evaluate the vascular anatomy. CONTRAST:  7mL OMNIPAQUE IOHEXOL 350 MG/ML SOLN COMPARISON:  May 11, 2021. FINDINGS: Cardiovascular: Satisfactory opacification of the pulmonary arteries to the segmental level. No evidence of pulmonary embolism. Normal heart size. No pericardial effusion. Atherosclerosis of thoracic aorta is noted without aneurysm or dissection. Mediastinum/Nodes: Stable findings consistent with multinodular goiter. No adenopathy is noted. Esophagus is unremarkable. Lungs/Pleura: Left lung is clear. Moderate right hydropneumothorax is noted with loculated effusion seen in the right lower lobe and associated atelectasis of the right lower lobe. Upper Abdomen: No acute abnormality. Musculoskeletal: Stable appearance of right segmental rib fractures and flail chest as noted on prior exam. Review of the MIP  images confirms the above findings. IMPRESSION: No definite evidence of pulmonary embolus. Moderate right hydropneumothorax is noted with loculated effusion seen in the right lower lobe and associated atelectasis of the right lower lobe. Stable appearance of right segmental rib fractures and flail chest is noted as described on prior exam. Aortic Atherosclerosis (ICD10-I70.0). Electronically Signed   By: Marijo Conception M.D.   On: 05/25/2021 16:38   DG CHEST PORT 1 VIEW  Result Date: 05/27/2021 CLINICAL DATA:  Pneumothorax. EXAM: PORTABLE CHEST 1 VIEW COMPARISON:  May 25, 2021. FINDINGS: Stable cardiomediastinal silhouette. Interval placement of pigtail drainage catheter in the right lung base. Right pleural effusion is significantly smaller compared to prior exam. Grossly stable small right apical pneumothorax is noted. Bony thorax is unremarkable. IMPRESSION: Interval placement of pigtail drainage catheter in right lung base. Right pleural effusion is significantly smaller. Grossly stable small right apical pneumothorax. Aortic Atherosclerosis (ICD10-I70.0). Electronically Signed   By: Bobbe Medico.D.  On: 05/27/2021 08:59   CT [facility] PLEURAL DRAIN W/INDWELL CATH W/IMG GUIDE  Result Date: 05/26/2021 INDICATION: 80 year old female with posttraumatic right hydropneumothorax. She has multiple severely displaced rib fractures and is not a candidate for percutaneous thoracentesis. She presents for chest tube placement. EXAM: Placement of chest tube with CT guidance MEDICATIONS: None ANESTHESIA/SEDATION: Fentanyl 37.5 mcg IV; Versed 0.5 mg IV Moderate Sedation Time:  11 minutes The patient was continuously monitored during the procedure by the interventional radiology nurse under my direct supervision. COMPLICATIONS: None immediate. PROCEDURE: Informed written consent was obtained from the patient after a thorough discussion of the procedural risks, benefits and alternatives. All questions were addressed.  Maximal Sterile Barrier Technique was utilized including caps, mask, sterile gowns, sterile gloves, sterile drape, hand hygiene and skin antiseptic. A timeout was performed prior to the initiation of the procedure. An axial CT scan was performed. The large right-sided hydropneumothorax was successfully identified. A suitable entry site over a rib along the right lateral chest wall inferior to the breast was identified. The skin entry site was marked and then the skin was sterilely prepped and draped in the standard fashion using chlorhexidine skin prep. Local anesthesia was attained by infiltration with 1% lidocaine. A small dermatotomy was made. Using trocar technique, a Cook 12 [place] all-purpose drainage catheter was advanced over the rib and positioned in the pleural fluid. The catheter was connected to low wall suction via a Pleur Evac. The catheter was secured to the skin with 0 Prolene suture. Sterile bandages were applied. Post placement CT scanning demonstrates a well-positioned chest tube in the dependent aspect of the pleural effusion. IMPRESSION: Successful placement of 12 French chest tube with CT guidance. Electronically Signed   By: Jacqulynn Cadet M.D.   On: 05/26/2021 11:11   VAS [place] LOWER EXTREMITY VENOUS (DVT) (ONLY [facility] & [facility] 7a-7p)  Result Date: 05/25/2021  Lower Venous DVT Study Patient Name:  EDNAH HAMMOCK Brunette  Date of Exam:   05/25/2021 Medical Rec #: [id number]          Accession #:    [id number] Date of Birth: 11-Dec-1941           Patient Gender: F Patient Age:   79Y Exam Location:  [facility] Procedure:      VAS [place] LOWER EXTREMITY VENOUS (DVT) Referring Phys: [id number] [place] --------------------------------------------------------------------------------  Indications: Swelling.  Risk Factors: None identified. Comparison Study: No prior studies. Performing Technologist: Oliver Hum RVT  Examination Guidelines: A complete evaluation includes B-mode imaging, spectral  Doppler, color Doppler, and power Doppler as needed of all accessible portions of each vessel. Bilateral testing is considered an integral part of a complete examination. Limited examinations for reoccurring indications may be performed as noted. The reflux portion of the exam is performed with the patient in reverse Trendelenburg.  +---------+---------------+---------+-----------+----------+--------------+ RIGHT    CompressibilityPhasicitySpontaneityPropertiesThrombus Aging +---------+---------------+---------+-----------+----------+--------------+ CFV      Full           Yes      Yes                                 +---------+---------------+---------+-----------+----------+--------------+ SFJ      Full                                                        +---------+---------------+---------+-----------+----------+--------------+  FV Prox  Full                                                        +---------+---------------+---------+-----------+----------+--------------+ FV Mid   Full                                                        +---------+---------------+---------+-----------+----------+--------------+ FV DistalFull                                                        +---------+---------------+---------+-----------+----------+--------------+ PFV      Full                                                        +---------+---------------+---------+-----------+----------+--------------+ POP      Full           Yes      Yes                                 +---------+---------------+---------+-----------+----------+--------------+ PTV      Full                                                        +---------+---------------+---------+-----------+----------+--------------+ PERO     Full                                                        +---------+---------------+---------+-----------+----------+--------------+ Gastroc  Partial                                       Acute          +---------+---------------+---------+-----------+----------+--------------+   +---------+---------------+---------+-----------+----------+--------------+ LEFT     CompressibilityPhasicitySpontaneityPropertiesThrombus Aging +---------+---------------+---------+-----------+----------+--------------+ CFV      Full           Yes      Yes                                 +---------+---------------+---------+-----------+----------+--------------+ SFJ      Full                                                        +---------+---------------+---------+-----------+----------+--------------+  FV Prox  Full                                                        +---------+---------------+---------+-----------+----------+--------------+ FV Mid   Full                                                        +---------+---------------+---------+-----------+----------+--------------+ FV DistalFull                                                        +---------+---------------+---------+-----------+----------+--------------+ PFV      Full                                                        +---------+---------------+---------+-----------+----------+--------------+ POP      Full           Yes      Yes                                 +---------+---------------+---------+-----------+----------+--------------+ PTV      Full                                                        +---------+---------------+---------+-----------+----------+--------------+ PERO     Full                                                        +---------+---------------+---------+-----------+----------+--------------+     Summary: RIGHT: - Findings consistent with acute deep vein thrombosis involving the right gastrocnemius veins. - No cystic structure found in the popliteal fossa.  LEFT: - There is no evidence of deep vein  thrombosis in the lower extremity.  - No cystic structure found in the popliteal fossa.  *See table(s) above for measurements and observations. Electronically signed by Deitra Mayo MD on 05/25/2021 at 6:43:30 PM.    Final         Scheduled Meds: . acetaminophen  1,000 mg Oral Q8H  . diltiazem  30 mg Oral Q6H  . feeding supplement  237 mL Oral TID BM  . losartan  100 mg Oral Daily  . mouth rinse  15 mL Mouth Rinse BID  . metoprolol tartrate  50 mg Oral BID  .  morphine injection  2 mg Intravenous Once  . ondansetron (ZOFRAN) IV  4 mg Intravenous Once  . pantoprazole  40 mg Oral Daily   Continuous Infusions: . sodium chloride 50 mL/hr  at 05/26/21 0445  . heparin 1,100 Units/hr (05/27/21 0859)     LOS: 2 days      Hosie Poisson, MD [facility]   To contact the attending provider between 7A-7P or the covering provider during after hours 7P-7A, please log into the web site [email or web address] and access using universal [place] password for that web site. If you do not have the password, please call the hospital operator.  05/27/2021, 3:03 PM

## 2021-05-28 ENCOUNTER — Inpatient Hospital Stay (HOSPITAL_COMMUNITY): Payer: Medicare Other

## 2021-05-28 DIAGNOSIS — Z7189 Other specified counseling: Secondary | ICD-10-CM

## 2021-05-28 DIAGNOSIS — Z66 Do not resuscitate: Secondary | ICD-10-CM

## 2021-05-28 DIAGNOSIS — Z515 Encounter for palliative care: Secondary | ICD-10-CM

## 2021-05-28 LAB — CBC
HCT: 32.1 % — ABNORMAL LOW (ref 36.0–46.0)
Hemoglobin: 10.2 g/dL — ABNORMAL LOW (ref 12.0–15.0)
MCH: 30.6 pg (ref 26.0–34.0)
MCHC: 31.8 g/dL (ref 30.0–36.0)
MCV: 96.4 fL (ref 80.0–100.0)
Platelets: 339 10*3/uL (ref 150–400)
RBC: 3.33 MIL/uL — ABNORMAL LOW (ref 3.87–5.11)
RDW: 13.2 % (ref 11.5–15.5)
WBC: 8 10*3/uL (ref 4.0–10.5)
nRBC: 0 % (ref 0.0–0.2)

## 2021-05-28 LAB — HEPARIN LEVEL (UNFRACTIONATED): Heparin Unfractionated: 0.51 IU/mL (ref 0.30–0.70)

## 2021-05-28 NOTE — Evaluation (Signed)
Physical Therapy Evaluation Patient Details Name: Melanie [name] MRN: [id number] DOB: [birth year] Today's Date: 05/28/2021   History of Present Illness  Pt is an 80 y.o. female who recently was admitted to [facility] from 05/11/2021-05/20/2021 with bilateral leg swelling following R-sided rib fractures with traumatic hemopneumothorax. During that admission pt developed a fib with RVR. Daughter reports that pt experiences intermitten SOB with exertion and progressive increase in leg swelling following discharge from hospital, re-admitted on 05/25/2021. Moderate right hydropneumothorax on CT with loculated effusion in the right lower lobe and associated atelectasis of the right lower lobe. Stable appearance of right segmental rib fractures and flail chest. PMH significant for COPD, multinodular goiter, essential hypertension, anxiety disorder, overactive bladder, A. fib with RVR.  Clinical Impression  Pt demonstrates ability to perform bed mobility and stand>pivot transfers without requiring physical assistance to complete. Pt requires cues to prevent from pulling chest tube during session. Pt limited during session due to chest tube to wall suction. Pt demonstrates deficits in overall strength, endurance, activity tolerance, safety precautions, and is limited by pain and will benefit from acute PT to improve safety and independence in mobility. SPT recommends HHPT to improve activity tolerance and mobility.     Follow Up Recommendations Home health PT;Supervision for mobility/OOB    Equipment Recommendations  None recommended by PT    Recommendations for Other Services       Precautions / Restrictions Precautions Precautions: Fall;Other (comment) Precaution Comments: chest tube to wall suction Restrictions Weight Bearing Restrictions: No      Mobility  Bed Mobility Overal bed mobility: Needs Assistance Bed Mobility: Supine to Sit     Supine to sit: Supervision;HOB elevated     General bed  mobility comments: Supervision for safety.    Transfers Overall transfer level: Needs assistance Equipment used: None Transfers: Stand Pivot Transfers   Stand pivot transfers: Min guard (x 2 repetitions)       General transfer comment: min G for safety and management of chest tube/IV.  Ambulation/Gait                Stairs            Wheelchair Mobility    Modified Rankin (Stroke Patients Only)       Balance Overall balance assessment: Needs assistance Sitting-balance support: No upper extremity supported;Feet supported Sitting balance-Leahy Scale: Good     Standing balance support: During functional activity Standing balance-Leahy Scale: Fair Standing balance comment: Pt tolerates standing after using bedside commode for peri care.                             Pertinent Vitals/Pain Pain Assessment: 0-10 Pain Score: 5  Pain Location: Rt ribs Pain Descriptors / Indicators: Discomfort;Grimacing;Guarding;Moaning Pain Intervention(s): Monitored during session;Limited activity within patient's tolerance    Home Living Family/patient expects to be discharged to:: Private residence Living Arrangements: Alone Available Help at Discharge: Family;Available 24 hours/day (son, daughter-in-law) Type of Home: House Home Access: Stairs to enter Entrance Stairs-Rails: Can reach both Entrance Stairs-Number of Steps: 10 Home Layout: Two level;Able to live on main level with bedroom/bathroom (Pt reports bedroom is downstairs, utilizing upstairs bathroom for bathing.) Home Equipment: Cane - single point;Walker - 2 wheels;Wheelchair - manual      Prior Function Level of Independence: Independent with assistive device(s)         Comments: Pt reports using walker for mobility in the home.  Hand Dominance        Extremity/Trunk Assessment   Upper Extremity Assessment Upper Extremity Assessment: Defer to OT evaluation    Lower Extremity  Assessment Lower Extremity Assessment: Generalized weakness    Cervical / Trunk Assessment Cervical / Trunk Assessment: Kyphotic  Communication   Communication: HOH  Cognition Arousal/Alertness: Awake/alert Behavior During Therapy: WFL for tasks assessed/performed Overall Cognitive Status: No family/caregiver present to determine baseline cognitive functioning Area of Impairment: Memory;Safety/judgement;Awareness;Following commands;Problem solving                     Memory: Decreased recall of precautions;Decreased short-term memory Following Commands: Follows one step commands consistently;Follows one step commands with increased time Safety/Judgement: Decreased awareness of safety   Problem Solving: Requires verbal cues;Slow processing General Comments: Pt provides some conflicting information pertaining to home living. Initally stating that she has 2-3 steps to enter her house, later stating there are 10 steps to enter with bil hand rails. Pt also reports her bedroom being downstairs, but uses shower/bathroom upstairs. During session pt attempted to pull chest tube. Pt education provided regarding tube.      General Comments General comments (skin integrity, edema, etc.): Pt requires cues to avoid pulling chest tube.    Exercises     Assessment/Plan    PT Assessment Patient needs continued PT services  PT Problem List Decreased strength;Decreased activity tolerance;Decreased mobility;Decreased safety awareness;Decreased knowledge of precautions;Pain       PT Treatment Interventions DME instruction;Gait training;Stair training;Functional mobility training;Therapeutic activities;Therapeutic exercise;Patient/family education    PT Goals (Current goals can be found in the Care Plan section)  Acute Rehab PT Goals Patient Stated Goal: Get better and go home PT Goal Formulation: With patient Time For Goal Achievement: 06/11/21 Potential to Achieve Goals: Good     Frequency Min 3X/week   Barriers to discharge        Co-evaluation               AM-PAC PT "6 Clicks" Mobility  Outcome Measure Help needed turning from your back to your side while in a flat bed without using bedrails?: A Little Help needed moving from lying on your back to sitting on the side of a flat bed without using bedrails?: A Little Help needed moving to and from a bed to a chair (including a wheelchair)?: A Little Help needed standing up from a chair using your arms (e.g., wheelchair or bedside chair)?: A Little Help needed to walk in hospital room?: A Little Help needed climbing 3-5 steps with a railing? : A Little 6 Click Score: 18    End of Session Equipment Utilized During Treatment: Gait belt Activity Tolerance: Treatment limited secondary to medical complications (Comment) (Maintain wall suction) Patient left: with call bell/phone within reach;in chair;with chair alarm set Nurse Communication: Mobility status PT Visit Diagnosis: Other abnormalities of gait and mobility (R26.89);Muscle weakness (generalized) (M62.81);History of falling (Z91.81);Difficulty in walking, not elsewhere classified (R26.2);Pain Pain - Right/Left: Right Pain - part of body:  (ribs)    Time: 4287-6811 PT Time Calculation (min) (ACUTE ONLY): 45 min   Charges:   PT Evaluation $PT Eval Low Complexity: 1 Low PT Treatments $Therapeutic Activity: 8-22 mins        Acute Rehab  Pager: [phone]   Garwin Brothers, SPT  05/28/2021, 1:43 PM

## 2021-05-28 NOTE — Progress Notes (Signed)
[place] for heparin Indication: acute  DVT, afib  Allergies  Allergen Reactions  . Latex Rash  . Macrodantin Shortness Of Breath  . Guaifenesin Er Itching  . Morphine And Related Nausea Only  . Ditropan [Oxybutynin]     Causes diarrhea - contains Magnesium  . Lipitor [Atorvastatin]     rash  . Lisinopril     Diarrhea/cough  . Magnesium-Containing Compounds     Causes diarrhea  . Pravastatin     Leg cramps  . Zoloft [Sertraline Hcl]     Diarrhea    Patient Measurements: Height: 5\' 8"  (172.7 cm) Weight: 59.9 kg (132 lb 0.9 oz) IBW/kg (Calculated) : 63.9 Heparin Dosing Weight: 61 kg  Vital Signs: Temp: 98.5 F (36.9 C) (06/03 1218) Temp Source: Oral (06/03 1218) BP: 124/57 (06/03 1218) Pulse Rate: 82 (06/03 1218)  Labs: Recent Labs    05/26/21 0330 05/27/21 0154 05/28/21 0347  HGB 10.9* 9.7* 10.2*  HCT 33.9* 31.1* 32.1*  PLT 453* 376 339  HEPARINUNFRC 0.33 0.28* 0.51  CREATININE 0.66  --   --     Estimated Creatinine Clearance: 52.2 mL/min (by C-G formula based on SCr of 0.66 mg/dL).   Assessment: Patient is an 80 y.o F who was hospitalized a week prior to this admission for fall with right rib fractures and hemopneumothorax, presented to the ED on 5/31 with c/o LE swelling and SOB.  LE doppler showed "acute deep vein thrombosis involving the right  gastrocnemius veins."  Pharmacy has been consulted to dose heparin for acute DVT and afib.   Heparin level now therapeutic  Goal of Therapy:  Heparin level 0.3-0.7 units/ml Monitor platelets by anticoagulation protocol: Yes   Plan:  Continue heparin at 1200 units / hr - monitor for s/sx bleeding - Daily heparin level and cbc  Thank you Anette Guarneri, PharmD 05/28/2021 1:56 PM

## 2021-05-28 NOTE — Progress Notes (Signed)
Mobility Specialist: Progress Note   05/28/21 1632  Mobility  Activity  (Cancel)   Going to hold off on seeing pt today as she recently received Ativan and RN said pt is pretty drowsy. Will f/u later.   [facility] [place] Mobility Specialist Mobility Specialist Phone: [phone]

## 2021-05-28 NOTE — Progress Notes (Signed)
PROGRESS NOTE    Melanie [name]  WCH:[id number] DOB: [birth year] DOA: 05/25/2021 PCP: Martinique, Betty G, MD    Chief Complaint  Patient presents with  . Leg Swelling    Brief Narrative:   Melanie [name] is a 80 y.o. female with medical history significant of COPD, multinodular goiter, essential hypertension, anxiety disorder, overactive bladder, A. fib with RVR, recent traumatic hemopneumothorax presented in the ED with bilateral leg swelling.   Patient was recently admitted to the [facility] trauma service from 05/11/21- 05/20/21 for right-sided rib fractures with hemopneumothorax following fall from a bed.  During hospitalization patient was intubated and then extubated, She had a right pigtail drain placed and removed and underwent IR thoracocentesis for right pleural effusion.  During that admission Patient has also developed A. fib with RVR which was evaluated by cardiology Dr. Einar Gip and placed on metoprolol and Cardizem with recommendation for outpatient follow-up.  She also required supplemental oxygen during that admission and was discharged home on 3 L of oxygen. Daughter in law reports intermittent shortness of breath with exertion, and progressive increase in leg swelling after she is discharged from hospital.  Her oxygen requirement has increased to 4 L today. CT chest: No definite evidence of pulmonary embolus. Moderate right hydropneumothorax is noted with loculated effusionseen in the right lower lobe and associated atelectasis of the right lower lobe. Stable appearance of right segmental rib fractures and flail chest is noted as described on prior exam. She underwent chest tube placement by IR.  Her breathing has improved she is more alert and participating in physical therapy. In view of her deconditioning palliative care consulted , recommended outpatient palliative follow up on discharge.      Assessment & Plan:   Principal Problem:   Atrial fibrillation with RVR  (HCC) Active Problems:   Primary hypertension   Hypercholesteremia   Multinodular goiter   Overactive bladder   GERD (gastroesophageal reflux disease)   Peripheral neuropathy due to chemotherapy (HCC)   Anxiety disorder   Closed fracture of multiple ribs with flail chest   Hydropneumothorax   Malnutrition of moderate degree   Atrial fibrillation with RVR Rate controlled, she initially required IV Cardizem, transition to oral Cardizem and metoprolol. Heart rate still between 100-1 20.  patient was started on heparin GTT. Possible transition to oral Eliquis on discharge.  Echocardiogram revealed preserved left ventricular ejection fraction.     Newly diagnosed right leg DVT Start the patient on IV heparin, possibly transition to Eliquis on discharge.     Right-sided hydropneumothorax with loculated effusions IR consulted.  Patient underwent chest tube placement for further evacuation of the effusion. Trauma surgery on board and appreciate recommendations.  Interval placement of pigtail drainage catheter in right lung base.  Chest tube to suction RIGHT sided effusion improving, and small apical  pneumothorax on CXR.  Repeat chest x-ray showed stable small apical pneumothorax Continue with nasal cannula oxygen to keep sats greater than 90% and monitor output Pain control   Essential hypertension Blood pressure parameters are optimal    GERD Continue with Protonix   Anxiety Continue with Ativan.    Normocytic anemia Baseline hemoglobin is around 12, dropped to 10.  transfuse to keep hemoglobin greater than 7.  Continue to monitor    DVT prophylaxis: Heparin Code Status:  Full code Family Communication: none at bedside.  Disposition:   Status is: Inpatient  Remains inpatient appropriate because:Ongoing diagnostic testing needed not appropriate for outpatient work up and  Unsafe d/c plan   Dispo: The patient is from: Home              Anticipated d/c is to:  pending              Patient currently is not medically stable to d/c.   Difficult to place patient No       Consultants:   IR  Surgery    Procedures: chest tube placement.  Antimicrobials:none.   Subjective: No new complaints on 4 L of oxygen  Objective: Vitals:   05/28/21 0803 05/28/21 0855 05/28/21 1218 05/28/21 1619  BP: (!) 109/57 (!) 141/51 (!) 124/57 127/76  Pulse: 63 82 82 96  Resp: 18  18 18   Temp: 97.9 F (36.6 C)  98.5 F (36.9 C) (!) 97.5 F (36.4 C)  TempSrc: Oral  Oral Oral  SpO2: 99%  100% 99%  Weight:      Height:        Intake/Output Summary (Last 24 hours) at 05/28/2021 1710 Last data filed at 05/28/2021 1630 Gross per 24 hour  Intake --  Output 449 ml  Net -449 ml   Filed Weights   05/25/21 1745 05/26/21 1200  Weight: 61.2 kg 59.9 kg    Examination:  General exam alert and comfortable Respiratory system: Diminished air entry at bases on 4 L of nasal cannula oxygen, tachypnea Cardiovascular system: S1 & S2 heard, irregularly irregular tachycardic no pedal edema Gastrointestinal system: Abdomen is soft, nondistended bowel sounds normal Central nervous system: Alert and answering simple questions Extremities: No pedal edema Skin: No rashes seen psychiatry: Mood is appropriate    Data Reviewed: I have personally reviewed following labs and imaging studies  CBC: Recent Labs  Lab 05/25/21 1356 05/26/21 0330 05/27/21 0154 05/28/21 0347  WBC 8.8 11.3* 8.2 8.0  NEUTROABS 7.5  --   --   --   HGB 11.1* 10.9* 9.7* 10.2*  HCT 34.5* 33.9* 31.1* 32.1*  MCV 95.0 94.4 96.0 96.4  PLT 403* 453* 376 161    Basic Metabolic Panel: Recent Labs  Lab 05/25/21 1356 05/26/21 0330  NA 134* 134*  K 4.6 4.7  CL 94* 90*  CO2 33* 34*  GLUCOSE 111* 122*  BUN 19 19  CREATININE 0.72 0.66  CALCIUM 9.5 9.5  MG  --  2.0  PHOS  --  3.9    GFR: Estimated Creatinine Clearance: 52.2 mL/min (by C-G formula based on SCr of 0.66 mg/dL).  Liver  Function Tests: Recent Labs  Lab 05/25/21 1358 05/26/21 0330  AST 26 20  ALT 14 19  ALKPHOS 53 52  BILITOT 0.6 0.4  PROT 6.5 6.4*  ALBUMIN 3.2* 3.1*    CBG: No results for input(s): GLUCAP in the last 168 hours.   Recent Results (from the past 240 hour(s))  Resp Panel by RT-PCR (Flu A&B, Covid) Nasopharyngeal Swab     Status: None   Collection Time: 05/25/21  1:21 PM   Specimen: Nasopharyngeal Swab; Nasopharyngeal(NP) swabs in vial transport medium  Result Value Ref Range Status   SARS Coronavirus 2 by RT PCR NEGATIVE NEGATIVE Final    Comment: (NOTE) SARS-CoV-2 target nucleic acids are NOT DETECTED.  The SARS-CoV-2 RNA is generally detectable in upper respiratory specimens during the acute phase of infection. The lowest concentration of SARS-CoV-2 viral copies this assay can detect is 138 copies/mL. A negative result does not preclude SARS-Cov-2 infection and should not be used as the sole basis for treatment or other  patient management decisions. A negative result may occur with  improper specimen collection/handling, submission of specimen other than nasopharyngeal swab, presence of viral mutation(s) within the areas targeted by this assay, and inadequate number of viral copies(<138 copies/mL). A negative result must be combined with clinical observations, patient history, and epidemiological information. The expected result is Negative.  Fact Sheet for Patients:  EntrepreneurPulse.com.au  Fact Sheet for Healthcare Providers:  IncredibleEmployment.be  This test is no t yet approved or cleared by the [place] FDA and  has been authorized for detection and/or diagnosis of SARS-CoV-2 by FDA under an Emergency Use Authorization (EUA). This EUA will remain  in effect (meaning this test can be used) for the duration of the COVID-19 declaration under Section 564(b)(1) of the Act, 21 U.S.C.section 360bbb-3(b)(1), unless the  authorization is terminated  or revoked sooner.       Influenza A by PCR NEGATIVE NEGATIVE Final   Influenza B by PCR NEGATIVE NEGATIVE Final    Comment: (NOTE) The Xpert Xpress SARS-CoV-2/FLU/RSV plus assay is intended as an aid in the diagnosis of influenza from Nasopharyngeal swab specimens and should not be used as a sole basis for treatment. Nasal washings and aspirates are unacceptable for Xpert Xpress SARS-CoV-2/FLU/RSV testing.  Fact Sheet for Patients: EntrepreneurPulse.com.au  Fact Sheet for Healthcare Providers: IncredibleEmployment.be  This test is not yet approved or cleared by the [place] FDA and has been authorized for detection and/or diagnosis of SARS-CoV-2 by FDA under an Emergency Use Authorization (EUA). This EUA will remain in effect (meaning this test can be used) for the duration of the COVID-19 declaration under Section 564(b)(1) of the Act, 21 U.S.C. section 360bbb-3(b)(1), unless the authorization is terminated or revoked.  Performed at [facility], [place] [address], [place], [place] [address]          Radiology Studies: [facility] Chest Port 1 View  Result Date: 05/28/2021 CLINICAL DATA:  Chest tube EXAM: PORTABLE CHEST 1 VIEW COMPARISON:  05/27/2021 FINDINGS: Right basilar chest tube is present. Similar residual right apical pneumothorax. Similar small right pleural effusion and right basilar atelectasis/consolidation. Stable cardiomediastinal contours. IMPRESSION: Similar residual right apical pneumothorax with basilar chest tube present. Similar small right pleural effusion and right basilar atelectasis/consolidation. Electronically Signed   By: Macy Mis M.D.   On: 05/28/2021 08:09   DG CHEST PORT 1 VIEW  Result Date: 05/27/2021 CLINICAL DATA:  Pneumothorax. EXAM: PORTABLE CHEST 1 VIEW COMPARISON:  May 25, 2021. FINDINGS: Stable cardiomediastinal silhouette. Interval placement of pigtail  drainage catheter in the right lung base. Right pleural effusion is significantly smaller compared to prior exam. Grossly stable small right apical pneumothorax is noted. Bony thorax is unremarkable. IMPRESSION: Interval placement of pigtail drainage catheter in right lung base. Right pleural effusion is significantly smaller. Grossly stable small right apical pneumothorax. Aortic Atherosclerosis (ICD10-I70.0). Electronically Signed   By: Marijo Conception M.D.   On: 05/27/2021 08:59        Scheduled Meds: . acetaminophen  1,000 mg Oral Q8H  . diltiazem  30 mg Oral Q6H  . feeding supplement  237 mL Oral TID BM  . losartan  100 mg Oral Daily  . mouth rinse  15 mL Mouth Rinse BID  . metoprolol tartrate  50 mg Oral BID  . pantoprazole  40 mg Oral Daily   Continuous Infusions: . heparin 1,200 Units/hr (05/28/21 0001)     LOS: 3 days      Hosie Poisson, MD [facility]  To contact the attending provider between 7A-7P or the covering provider during after hours 7P-7A, please log into the web site [email or web address] and access using universal [place] password for that web site. If you do not have the password, please call the hospital operator.  05/28/2021, 5:10 PM

## 2021-05-28 NOTE — Care Management (Signed)
05-28-21 1742 Late Entry: Case Manager spoke to patient regarding outpatient palliative services. Patient and son are agreeable to outpatient palliative services with [facility]. Referral made to [facility]. Son had questions regarding possible SNF once patient is ready to transition home. Case Manager stated we will monitor the patient's progression and if the son feels like the patient is not at her baseline and needs additional care, SNF may be appropriate. Transitions of Care Team to follow for additional transition needs.

## 2021-05-28 NOTE — Consult Note (Signed)
   [facility] Inpatient Consult   05/28/2021  Melanie [name] [birth year] [id number]   [place] Organization [ACO] Patient: [name]  Primary Care Provider: Martinique, Betty G, MD  Patient was screened for [facility] services for unplanned readmission in less than 30 days. Patient will have the transition of care call conducted by the primary care provider. This patient is also in an Embedded practice which has a chronic disease management Embedded Care Management team.  Plan: Continue to follow for progress and disposition needs and refer as needed.  Please contact for further questions,  Natividad Brood, RN BSN [facility] [place] [facility] Liaison  [phone] business mobile phone Toll free office [phone]  Fax number: [phone] [email or web address]

## 2021-05-28 NOTE — Consult Note (Signed)
Palliative Medicine Inpatient Consult Note  Reason for consult:  Goals of Care  HPI:  Per intake H&P --> [name] Ratliffeis a 80 y.o.femalewith medical history significant ofCOPD,multinodular goiter, essential hypertension, anxiety disorder, overactive bladder, A. fib with RVR,recent traumatic hemopneumothorax presented in the ED with bilateral leg swelling.   Patient was recently admitted to the [facility] trauma service from 05/11/21-5/26/22for right-sided rib fractures with hemopneumothorax following fall from a bed. During hospitalization patient was intubated and then extubated, Shehad a right pigtail drain placed and removed and underwent IR thoracocentesis for right pleural effusion. During that admission Patienthasalso developed A. fib with RVR which was evaluated by cardiology Dr. Einar Gip and placed on metoprolol and Cardizem with recommendation for outpatient follow-up. She also required supplemental oxygen during that admission and was discharged home on 3 L of oxygen.  Daughter in law reports intermittent shortness of breath with exertion,and progressive increase in leg swelling after she is discharged from hospital. Her oxygen requirement has increased to 4 L today. CT chest:No definite evidence of pulmonary embolus.Moderate right hydropneumothorax is noted with loculated effusionseen in the right lower lobe and associated atelectasis of the rightlower lobe. Stable appearance of right segmental rib fractures and flail chest is noted as described on prior exam.  Of care has been asked to get involved in the setting of multiple chronic comorbid conditions to further define goals of care.  Clinical Assessment/Goals of Care:  *Please note that this is a verbal dictation therefore any spelling or grammatical errors are due to the "[place] [employer]" system interpretation.  I have reviewed medical records including EPIC notes, labs and imaging, received report from  bedside RN, assessed the patient who was sitting up in the chair in no acute distress.    I met with Melanie [name] to further discuss diagnosis prognosis, GOC, EOL wishes, disposition and options.   I introduced Palliative Medicine as specialized medical care for people living with serious illness. It focuses on providing relief from the symptoms and stress of a serious illness. The goal is to improve quality of life for both the patient and the family.  Melanie [name] shares with me that she lives in [place], [place].  She is a widow as her late husband passed away 3 years ago which was very difficult for her.  She has 4 children three daughters and one son.  Her eldest daughter lives in [place].  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  She shares that this profession was "all right".  She enjoys shopping in her leisure time.  She is a woman of faith and practices within the [employer] denomination.  Prior to her traumatic injury in May she was fully independent of all BADLs and IADLs and still driving.  She has since been living with her son, Melanie [name] and [place], [place].   Review of patient's conditions was held inclusive of her  traumatic hemopneumothorax , recent falls, COPD, and A. fib with RVR.  Discussed the chronicity of her COPD and A. fib.  We reviewed that COPD is a progressive disease and that over time her symptoms will likely worsen.  A detailed discussion was had today regarding advanced directives - she shares that she "started these" and never got them notarized.  She is interested in completing this while she is hospitalized.    Adlean shares that if she were ever to become mentally incapacitated she would wish for her son, Melanie [name] to make decisions in her honor.  Concepts  specific to code status, artifical feeding and hydration, continued IV antibiotics and rehospitalization was had. I completed a MOST form today. The patient and family outlined their wishes for the following  treatment decisions:  Cardiopulmonary Resuscitation: Do Not Attempt Resuscitation (DNR/No CPR)  Medical Interventions: Limited Additional Interventions: Use medical treatment, IV fluids and cardiac monitoring as indicated, DO NOT USE intubation or mechanical ventilation. May consider use of less invasive airway support such as BiPAP or CPAP. Also provide comfort measures. Transfer to the hospital if indicated. Avoid intensive care.   Antibiotics: Determine use of limitation of antibiotics when infection occurs  IV Fluids: IV fluids for a defined trial period  Feeding Tube: No feeding tube   Patient's personal goals are to optimize strength and "be able to live independently" again.  Does not like being reliant upon her family members and is a very independent woman.  Discussed the importance of continued conversation with family and their  medical providers regarding overall plan of care and treatment options, ensuring decisions are within the context of the patients values and GOCs.  Decision Maker: Melanie [name] (son) - [phone]  SUMMARY OF RECOMMENDATIONS   DNAR/DNI  MOST Completed, paper copy placed onto the chart electric copy can be found in [facility]  DNR Form Completed, paper copy placed onto the chart electric copy can be found in [facility]  Appreciate spiritual care team assisting with completion of advance directives  Appreciate transitions of care team - this patient would benefit from outpatient palliative support upon discharge  Ongoing palliative care support  Code Status/Advance Care Planning: DNAR/DNI   Palliative Prophylaxis:   Oral care, mobility  Additional Recommendations (Limitations, Scope, Preferences):  Treat what is treatable  Psycho-social/Spiritual:   Desire for further Chaplaincy support:  Yes  Additional Recommendations:  Education on chronic disease processes   Prognosis:  Check he has had 2 recent hospitalizations and has multiple chronic  comorbid conditions.  She is at high 89-month mortality risk.  Discharge Planning:  Discharge will likely be home with her son with outpatient PT and palliative support.   Vitals:   05/28/21 0855 05/28/21 1218  BP: (!) 141/51 (!) 124/57  Pulse: 82 82  Resp:  18  Temp:  98.5 F (36.9 C)  SpO2:  100%    Intake/Output Summary (Last 24 hours) at 05/28/2021 1359 Last data filed at 05/28/2021 0803 Gross per 24 hour  Intake 388.56 ml  Output 213 ml  Net 175.56 ml   Last Weight  Most recent update: 05/26/2021 12:01 PM   Weight  59.9 kg (132 lb 0.9 oz)           Gen:  Elderly F in NAD HEENT: moist mucous membranes CV: Regular rate and rhythm, no murmurs rubs or gallops PULM: On 4LPM [place], R chest tube in place with serosanguinous fluid ABD: soft/nontender  EXT: No edema  Neuro: Alert and oriented x3   PPS: 60%   This conversation/these recommendations were discussed with patient primary care team, Dr. Karleen Hampshire  Time In: 1320 Time Out: 1430 Total Time: 70 Greater than 50%  of this time was spent counseling and coordinating care related to the above assessment and plan.  [place] Team Team Cell Phone: [phone] Please utilize secure chat with additional questions, if there is no response within 30 minutes please call the above phone number  Palliative Medicine Team providers are available by phone from 7am to 7pm daily and can be reached through the  team cell phone.  Should this patient require assistance outside of these hours, please call the patient's attending physician.

## 2021-05-28 NOTE — Progress Notes (Signed)
Patient requested to have all bed rails up

## 2021-05-28 NOTE — Progress Notes (Addendum)
1707: Dr. Karleen Hampshire advised that pt's heart rate is bouncing between 120's to 130's.   Advised night shift RN that heart rate had been jumping to 140 and then drops back down to maintain around 120's.   During shift change report with night shift heart rate was seen in the 80's.

## 2021-05-28 NOTE — Progress Notes (Signed)
Referring Physician(s): Shawna Clamp [facility])  Supervising Physician: Arne Cleveland  Patient Status:  [facility] - In-pt  Chief Complaint:  Post-traumatic right hydropneumothorax s/p right chest tube placement in IR 05/26/2021.  Subjective:  Patient awake and alert sitting in chair. States she is "sluggish" this AM, otherwise no complaints. Right chest tube site c/d/i. On 4L [place], sating 100%  CXR this AM: 1. Similar residual right apical pneumothorax with basilar chest tube present. 2. Similar small right pleural effusion and right basilar atelectasis/consolidation.   Allergies: Latex, Macrodantin, Guaifenesin er, Morphine and related, Ditropan [oxybutynin], Lipitor [atorvastatin], Lisinopril, Magnesium-containing compounds, Pravastatin, and Zoloft [sertraline hcl]  Medications: Prior to Admission medications   Medication Sig Start Date End Date Taking? Authorizing Provider  acetaminophen (TYLENOL) 650 MG CR tablet Take 650 mg by mouth every 8 (eight) hours as needed for fever or pain.   Yes [provider]  CALCIUM-VITAMIN D PO Take 1 tablet by mouth daily.   Yes [provider]  cetirizine (ZYRTEC) 10 MG tablet Take 0.5 tablets (5 mg total) by mouth daily. Patient taking differently: Take 10 mg by mouth daily. 09/09/20  Yes Martinique, Betty G, MD  diltiazem (CARDIZEM) 30 MG tablet Take 1 tablet (30 mg total) by mouth every 6 (six) hours. 05/20/21  Yes Maczis, Barth Kirks, PA-C  docusate sodium (COLACE) 100 MG capsule Take 1 capsule (100 mg total) by mouth 2 (two) times daily as needed for mild constipation. 05/20/21  Yes Maczis, Barth Kirks, PA-C  fluticasone (FLONASE) 50 MCG/ACT nasal spray Place 1 spray into both nostrils 2 (two) times daily. Patient taking differently: Place 1 spray into both nostrils 2 (two) times daily as needed for allergies. 11/05/19  Yes [place], Betty G, MD  hydrocortisone cream 1 % Apply 1 application topically 4 (four) times daily as needed for  itching.   Yes [provider]  LORazepam (ATIVAN) 0.5 MG tablet Take 0.5 mg by mouth 2 (two) times daily as needed for anxiety. 04/30/21  Yes [provider]  metoprolol tartrate (LOPRESSOR) 50 MG tablet Take 1 tablet (50 mg total) by mouth 2 (two) times daily. 05/20/21  Yes Maczis, Barth Kirks, PA-C  oxyCODONE (OXY IR/ROXICODONE) 5 MG immediate release tablet Take 1 tablet (5 mg total) by mouth every 6 (six) hours as needed for breakthrough pain. 05/20/21  Yes Ileana Roup, MD  pantoprazole (PROTONIX) 20 MG tablet Take 20 mg by mouth daily. 05/10/21  Yes [provider]  polyvinyl alcohol (LIQUIFILM TEARS) 1.4 % ophthalmic solution Place 1 drop into both eyes as needed for dry eyes.   Yes [provider]  VITAMIN D PO Take 1 capsule by mouth daily.   Yes [provider]  acetaminophen (TYLENOL) 500 MG tablet Take 2 tablets (1,000 mg total) by mouth every 8 (eight) hours. Patient not taking: Reported on 05/25/2021 05/20/21   Jillyn Ledger, PA-C  LORazepam (ATIVAN) 0.5 MG tablet Take 0.5-1 tablets (0.25-0.5 mg total) by mouth 2 (two) times daily as needed. for anxiety Patient not taking: Reported on 05/25/2021 03/30/21   Martin[place], Betty G, MD  losartan (COZAAR) 100 MG tablet TAKE 1 TABLET BY MOUTH EVERY DAY Patient not taking: Reported on 05/25/2021 03/22/21   [place], Betty G, MD  pantoprazole (PROTONIX) 20 MG tablet TAKE 1 TABLET BY MOUTH 30 MINUTES BEFORE BREAKFAST. STOP OMEPRAZOLE Patient not taking: Reported on 05/25/2021 05/10/21   [place], Betty G, MD  polyethylene glycol (MIRALAX / GLYCOLAX) 17 g packet Take 17  g by mouth daily as needed. Patient not taking: Reported on 05/25/2021 05/20/21   Jillyn Ledger, PA-C     Vital Signs: BP (!) 141/51   Pulse 82   Temp 97.9 F (36.6 C) (Oral)   Resp 18   Ht 5\' 8"  (1.727 m)   Wt 132 lb 0.9 oz (59.9 kg)   SpO2 99%   BMI 20.08 kg/m   Physical Exam Vitals and nursing note reviewed.  Constitutional:       General: She is not in acute distress. Cardiovascular:     Rate and Rhythm: Tachycardia present.  Pulmonary:     Effort: Pulmonary effort is normal. No respiratory distress.     Comments: On 4L [place], sating 100%. Right chest tube site without tenderness, erythema, ative bleeding, or drainage; approximately 360 cc serosanguinous fluid in pleure-vac; tube to suction with (-) air leak. Skin:    General: Skin is warm and dry.  Neurological:     Mental Status: She is alert and oriented to person, place, and time.     Imaging: DG Chest 2 View  Addendum Date: 05/25/2021   ADDENDUM REPORT: 05/25/2021 13:45 ADDENDUM: Critical Value/emergent results were called by telephone at the time of interpretation on 05/25/2021 at 1:45 pm to provider Silverio Decamp, PA, who verbally acknowledged these results. Electronically Signed   By: Lowella Grip III M.D.   On: 05/25/2021 13:45   Result Date: 05/25/2021 CLINICAL DATA:  Shortness of breath EXAM: CHEST - 2 VIEW COMPARISON:  May 20, 2021. FINDINGS: There is a fairly small right apical pneumothorax without tension component. There are multiple displaced rib fractures on the right. There is a moderate pleural effusion on the right with atelectasis and potential consolidation in portions of the right middle and lower lobes. Left lung clear. Heart size and pulmonary vascularity are normal. No adenopathy. There is degenerative change in the thoracic spine. IMPRESSION: Multiple displaced rib fractures on the right with right pleural effusion and fairly small right apical pneumothorax. No tension component. Atelectasis with potential consolidation in portions of the right middle and lower lobes. Left lung clear. Heart size normal. Aortic Atherosclerosis (ICD10-I70.0). Electronically Signed: By: Lowella Grip III M.D. On: 05/25/2021 13:31   CT Angio Chest PE W/Cm &/Or Wo Cm  Result Date: 05/25/2021 CLINICAL DATA:  Shortness of breath. EXAM: CT ANGIOGRAPHY  CHEST WITH CONTRAST TECHNIQUE: Multidetector CT imaging of the chest was performed using the standard protocol during bolus administration of intravenous contrast. Multiplanar CT image reconstructions and MIPs were obtained to evaluate the vascular anatomy. CONTRAST:  7mL OMNIPAQUE IOHEXOL 350 MG/ML SOLN COMPARISON:  May 11, 2021. FINDINGS: Cardiovascular: Satisfactory opacification of the pulmonary arteries to the segmental level. No evidence of pulmonary embolism. Normal heart size. No pericardial effusion. Atherosclerosis of thoracic aorta is noted without aneurysm or dissection. Mediastinum/Nodes: Stable findings consistent with multinodular goiter. No adenopathy is noted. Esophagus is unremarkable. Lungs/Pleura: Left lung is clear. Moderate right hydropneumothorax is noted with loculated effusion seen in the right lower lobe and associated atelectasis of the right lower lobe. Upper Abdomen: No acute abnormality. Musculoskeletal: Stable appearance of right segmental rib fractures and flail chest as noted on prior exam. Review of the MIP images confirms the above findings. IMPRESSION: No definite evidence of pulmonary embolus. Moderate right hydropneumothorax is noted with loculated effusion seen in the right lower lobe and associated atelectasis of the right lower lobe. Stable appearance of right segmental rib fractures and flail chest is noted as described  on prior exam. Aortic Atherosclerosis (ICD10-I70.0). Electronically Signed   By: Marijo Conception M.D.   On: 05/25/2021 16:38   DG Chest Port 1 View  Result Date: 05/28/2021 CLINICAL DATA:  Chest tube EXAM: PORTABLE CHEST 1 VIEW COMPARISON:  05/27/2021 FINDINGS: Right basilar chest tube is present. Similar residual right apical pneumothorax. Similar small right pleural effusion and right basilar atelectasis/consolidation. Stable cardiomediastinal contours. IMPRESSION: Similar residual right apical pneumothorax with basilar chest tube present. Similar small  right pleural effusion and right basilar atelectasis/consolidation. Electronically Signed   By: Macy Mis M.D.   On: 05/28/2021 08:09   DG CHEST PORT 1 VIEW  Result Date: 05/27/2021 CLINICAL DATA:  Pneumothorax. EXAM: PORTABLE CHEST 1 VIEW COMPARISON:  May 25, 2021. FINDINGS: Stable cardiomediastinal silhouette. Interval placement of pigtail drainage catheter in the right lung base. Right pleural effusion is significantly smaller compared to prior exam. Grossly stable small right apical pneumothorax is noted. Bony thorax is unremarkable. IMPRESSION: Interval placement of pigtail drainage catheter in right lung base. Right pleural effusion is significantly smaller. Grossly stable small right apical pneumothorax. Aortic Atherosclerosis (ICD10-I70.0). Electronically Signed   By: Marijo Conception M.D.   On: 05/27/2021 08:59   CT [facility] PLEURAL DRAIN W/INDWELL CATH W/IMG GUIDE  Result Date: 05/26/2021 INDICATION: 80 year old female with posttraumatic right hydropneumothorax. She has multiple severely displaced rib fractures and is not a candidate for percutaneous thoracentesis. She presents for chest tube placement. EXAM: Placement of chest tube with CT guidance MEDICATIONS: None ANESTHESIA/SEDATION: Fentanyl 37.5 mcg IV; Versed 0.5 mg IV Moderate Sedation Time:  11 minutes The patient was continuously monitored during the procedure by the interventional radiology nurse under my direct supervision. COMPLICATIONS: None immediate. PROCEDURE: Informed written consent was obtained from the patient after a thorough discussion of the procedural risks, benefits and alternatives. All questions were addressed. Maximal Sterile Barrier Technique was utilized including caps, mask, sterile gowns, sterile gloves, sterile drape, hand hygiene and skin antiseptic. A timeout was performed prior to the initiation of the procedure. An axial CT scan was performed. The large right-sided hydropneumothorax was successfully identified. A  suitable entry site over a rib along the right lateral chest wall inferior to the breast was identified. The skin entry site was marked and then the skin was sterilely prepped and draped in the standard fashion using chlorhexidine skin prep. Local anesthesia was attained by infiltration with 1% lidocaine. A small dermatotomy was made. Using trocar technique, a Cook 12 [place] all-purpose drainage catheter was advanced over the rib and positioned in the pleural fluid. The catheter was connected to low wall suction via a Pleur Evac. The catheter was secured to the skin with 0 Prolene suture. Sterile bandages were applied. Post placement CT scanning demonstrates a well-positioned chest tube in the dependent aspect of the pleural effusion. IMPRESSION: Successful placement of 12 French chest tube with CT guidance. Electronically Signed   By: Jacqulynn Cadet M.D.   On: 05/26/2021 11:11   VAS [place] LOWER EXTREMITY VENOUS (DVT) (ONLY [facility] & [facility] 7a-7p)  Result Date: 05/25/2021  Lower Venous DVT Study Patient Name:  KEI LANGHORST Wernick  Date of Exam:   05/25/2021 Medical Rec #: [id number]          Accession #:    [id number] Date of Birth: Aug 18, 1941           Patient Gender: F Patient Age:   79Y Exam Location:  [facility] Procedure:      VAS [place] LOWER  EXTREMITY VENOUS (DVT) Referring Phys: [id number] [place] --------------------------------------------------------------------------------  Indications: Swelling.  Risk Factors: None identified. Comparison Study: No prior studies. Performing Technologist: Oliver Hum RVT  Examination Guidelines: A complete evaluation includes B-mode imaging, spectral Doppler, color Doppler, and power Doppler as needed of all accessible portions of each vessel. Bilateral testing is considered an integral part of a complete examination. Limited examinations for reoccurring indications may be performed as noted. The reflux portion of the exam is performed with the patient in  reverse Trendelenburg.  +---------+---------------+---------+-----------+----------+--------------+ RIGHT    CompressibilityPhasicitySpontaneityPropertiesThrombus Aging +---------+---------------+---------+-----------+----------+--------------+ CFV      Full           Yes      Yes                                 +---------+---------------+---------+-----------+----------+--------------+ SFJ      Full                                                        +---------+---------------+---------+-----------+----------+--------------+ FV Prox  Full                                                        +---------+---------------+---------+-----------+----------+--------------+ FV Mid   Full                                                        +---------+---------------+---------+-----------+----------+--------------+ FV DistalFull                                                        +---------+---------------+---------+-----------+----------+--------------+ PFV      Full                                                        +---------+---------------+---------+-----------+----------+--------------+ POP      Full           Yes      Yes                                 +---------+---------------+---------+-----------+----------+--------------+ PTV      Full                                                        +---------+---------------+---------+-----------+----------+--------------+ PERO     Full                                                        +---------+---------------+---------+-----------+----------+--------------+  Gastroc  Partial                                      Acute          +---------+---------------+---------+-----------+----------+--------------+   +---------+---------------+---------+-----------+----------+--------------+ LEFT     CompressibilityPhasicitySpontaneityPropertiesThrombus Aging  +---------+---------------+---------+-----------+----------+--------------+ CFV      Full           Yes      Yes                                 +---------+---------------+---------+-----------+----------+--------------+ SFJ      Full                                                        +---------+---------------+---------+-----------+----------+--------------+ FV Prox  Full                                                        +---------+---------------+---------+-----------+----------+--------------+ FV Mid   Full                                                        +---------+---------------+---------+-----------+----------+--------------+ FV DistalFull                                                        +---------+---------------+---------+-----------+----------+--------------+ PFV      Full                                                        +---------+---------------+---------+-----------+----------+--------------+ POP      Full           Yes      Yes                                 +---------+---------------+---------+-----------+----------+--------------+ PTV      Full                                                        +---------+---------------+---------+-----------+----------+--------------+ PERO     Full                                                        +---------+---------------+---------+-----------+----------+--------------+  Summary: RIGHT: - Findings consistent with acute deep vein thrombosis involving the right gastrocnemius veins. - No cystic structure found in the popliteal fossa.  LEFT: - There is no evidence of deep vein thrombosis in the lower extremity.  - No cystic structure found in the popliteal fossa.  *See table(s) above for measurements and observations. Electronically signed by Deitra Mayo MD on 05/25/2021 at 6:43:30 PM.    Final     Labs:  CBC: Recent Labs    05/25/21 1356  05/26/21 0330 05/27/21 0154 05/28/21 0347  WBC 8.8 11.3* 8.2 8.0  HGB 11.1* 10.9* 9.7* 10.2*  HCT 34.5* 33.9* 31.1* 32.1*  PLT 403* 453* 376 339    COAGS: Recent Labs    05/11/21 0035  INR 1.0    BMP: Recent Labs    09/09/20 1222 03/30/21 1301 05/17/21 0921 05/18/21 0553 05/25/21 1356 05/26/21 0330  NA 136   < > 135 135 134* 134*  K 4.0   < > 3.3* 4.5 4.6 4.7  CL 99   < > 96* 93* 94* 90*  CO2 32   < > 32 35* 33* 34*  GLUCOSE 88   < > 106* 106* 111* 122*  BUN 18   < > 17 19 19 19   CALCIUM 9.9   < > 9.3 9.0 9.5 9.5  CREATININE 0.87   < > 0.69 0.77 0.72 0.66  GFRNONAA 62   < > >60 >60 >60 >60  GFRAA 72  --   --   --   --   --    < > = values in this interval not displayed.    LIVER FUNCTION TESTS: Recent Labs    03/30/21 1301 05/11/21 0035 05/25/21 1358 05/26/21 0330  BILITOT 0.5 0.5 0.6 0.4  AST 18 25 26 20   ALT 10 13 14 19   ALKPHOS 52 54 53 52  PROT 7.3 6.7 6.5 6.4*  ALBUMIN 4.4 3.6 3.2* 3.1*    Assessment and Plan:  Post-traumatic right hydropneumothorax s/p right chest tube placement in IR 05/26/2021. Right chest tube stable with approximately 360 cc serosanguinous fluid in pleure-vac, tube to suction with (-) air leak. CXR this AM with residual right apical pneumothorax. Continue chest tube management- tube to remain to suction, continue with Qshift monitor of output, F/U CXR ordered for tomorrow AM. Further plans per [facility]- appreciate and agree with management. IR to follow.   Electronically Signed: Earley Abide, PA-C 05/28/2021, 10:54 AM   I spent a total of 15 Minutes at the the patient's bedside AND on the patient's hospital floor or unit, greater than 50% of which was counseling/coordinating care for right hydropneumothorax s/p right chest tube placement.

## 2021-05-28 NOTE — Progress Notes (Signed)
Occupational Therapy Evaluation Patient Details Name: Melanie [name] MRN: [id number] DOB: [birth year] Today's Date: 05/28/2021    History of Present Illness Pt is an 80 y.o. female who recently was admitted to [facility] from 05/11/2021-05/20/2021 with bilateral leg swelling following R-sided rib fractures with traumatic hemopneumothorax. During that admission pt developed a fib with RVR. Daughter reports that pt experiences intermitten SOB with exertion and progressive increase in leg swelling following discharge from hospital, re-admitted on 05/25/2021. Moderate right hydropneumothorax on CT with loculated effusion in the right lower lobe and associated atelectasis of the right lower lobe. Stable appearance of right segmental rib fractures and flail chest. PMH significant for COPD, multinodular goiter, essential hypertension, anxiety disorder, overactive bladder, A. fib with RVR.   Clinical Impression   Melanie [name] was evaluated s/p the above bilat leg swelling. Upon arrival RN was completing stand-pivot transfer from chair>bed, pt was fatigued and had labored breathing; therefore this eval was completed at bed level. PTA pt reports being indep in ADLs; however this therapist received conflicting information per PT's notes. Upon evaluation she is supervision for min/mod for ADLs due to fatigue and labored breathing. VSS on 4L O2. Educated pt on incentive spirometer and importance to use several times per day, she verbalized understanding. Pt benefits from acute OT to progress function towards baseline. Recommend d/c to home with 24/7 assistance and [facility] OT, pending acute rehab progress.     Follow Up Recommendations  Home health OT;Supervision/Assistance - 24 hour (pending progress)   Equipment Recommendations  None recommended by OT       Precautions / Restrictions Precautions Precautions: Fall;Other (comment) Precaution Comments: chest tube to wall suction Restrictions Weight Bearing Restrictions: No       Mobility Bed Mobility Overal bed mobility: Needs Assistance Bed Mobility: Supine to Sit     Supine to sit: Supervision;HOB elevated     General bed mobility comments: Supervision for safety.    Transfers Overall transfer level: Needs assistance               General transfer comment: deferred this session due to pt labored breathing and fatigue    Balance Overall balance assessment: Needs assistance             ADL either performed or assessed with clinical judgement   ADL Overall ADL's : Needs assistance/impaired Eating/Feeding: Set up;Sitting   Grooming: Wash/dry hands;Wash/dry face;Oral care;Brushing hair;Set up;Bed level   Upper Body Bathing: Moderate assistance;Sitting   Lower Body Bathing: Moderate assistance;Sit to/from stand   Upper Body Dressing : Minimal assistance;Sitting   Lower Body Dressing: Moderate assistance;Sit to/from stand   Toilet Transfer: Minimal assistance;Stand-pivot;BSC   Toileting- Water quality scientist and Hygiene: Min guard;Sitting/lateral lean         General ADL Comments: pt fatiques quickly and breathnig is labored with functional activity     Vision Baseline Vision/History: Wears glasses Wears Glasses: At all times              Pertinent Vitals/Pain Pain Assessment: 0-10 Faces Pain Scale: Hurts little more Pain Location: Rt ribs Pain Descriptors / Indicators: Discomfort;Grimacing;Guarding;Moaning Pain Intervention(s): Monitored during session     Hand Dominance Right   Extremity/Trunk Assessment Upper Extremity Assessment Upper Extremity Assessment: Generalized weakness   Lower Extremity Assessment Lower Extremity Assessment: Defer to PT evaluation   Cervical / Trunk Assessment Cervical / Trunk Assessment: Kyphotic   Communication Communication Communication: HOH   Cognition Arousal/Alertness: Awake/alert Behavior During Therapy: WFL for tasks assessed/performed Overall Cognitive Status:  No  family/caregiver present to determine baseline cognitive functioning Area of Impairment: Memory;Awareness;Following commands;Safety/judgement            Memory: Decreased short-term memory Following Commands: Follows one step commands consistently;Follows one step commands with increased time Safety/Judgement: Decreased awareness of safety Awareness: Emergent Problem Solving: Requires verbal cues;Slow processing General Comments: Pt provided conflicting information to this therapist as compared to PT note   General Comments  Upon arrival, RN transferring pt bed>chair; pt extremely fatigued with labored breathing, VSS on 4L O2; educateed pt on importance of IS            Home Living Family/patient expects to be discharged to:: Private residence Living Arrangements: Alone Available Help at Discharge: Family;Available 24 hours/day Type of Home: House Home Access: Stairs to enter [employer] of Steps: 10 Entrance Stairs-Rails: Can reach both Home Layout: Two level;Able to live on main level with bedroom/bathroom Alternate Level Stairs-Number of Steps: flight   Bathroom Shower/Tub: Teacher, early years/pre: Standard Bathroom Accessibility: Yes How Accessible: Accessible via walker Home Equipment: Cane - single point;Walker - 2 wheels;Wheelchair - manual   Additional Comments: Pt reports d/c to son and daughter in [place] home      Prior Functioning/Environment Level of Independence: Independent with assistive device(s)        Comments: Pt reports she was not using an AD for ambulation        OT Problem List: Decreased strength;Decreased activity tolerance;Impaired balance (sitting and/or standing);Decreased cognition;Impaired vision/perception;Pain;Cardiopulmonary status limiting activity;Decreased safety awareness      OT Treatment/Interventions: Self-care/ADL training;Therapeutic exercise;Neuromuscular education;Energy conservation;Therapeutic  activities;Cognitive remediation/compensation;Balance training;Visual/perceptual remediation/compensation;Patient/family education    OT Goals(Current goals can be found in the care plan section) Acute Rehab OT Goals Patient Stated Goal: Get better and go home OT Goal Formulation: With patient Time For Goal Achievement: 06/11/21 Potential to Achieve Goals: Good ADL Goals Pt Will Perform Grooming: with supervision;standing Pt Will Perform Upper Body Bathing: sitting;standing;with set-up Pt Will Perform Upper Body Dressing: with supervision;sitting Pt Will Perform Lower Body Dressing: with supervision;sit to/from stand Pt Will Transfer to Toilet: with supervision;ambulating Pt Will Perform Toileting - Clothing Manipulation and hygiene: with supervision;sit to/from stand  OT Frequency: Min 2X/week    AM-PAC OT "6 Clicks" Daily Activity     Outcome Measure Help from another person eating meals?: A Little Help from another person taking care of personal grooming?: A Little Help from another person toileting, which includes using toliet, bedpan, or urinal?: A Little Help from another person bathing (including washing, rinsing, drying)?: A Lot Help from another person to put on and taking off regular upper body clothing?: A Little Help from another person to put on and taking off regular lower body clothing?: A Lot 6 Click Score: 16   End of Session Nurse Communication: Mobility status;Other (comment) (importance of mobility this weekend)  Activity Tolerance: Patient limited by fatigue Patient left: in bed;with call bell/phone within reach;with bed alarm set  OT Visit Diagnosis: Unsteadiness on feet (R26.81);Pain;Repeated falls (R29.6) Pain - part of body:  (ribs)                Time: 5361-4431 OT Time Calculation (min): 20 min Charges:      Yuya Vanwingerden A Lawanna Cecere 05/28/2021, 4:37 PM

## 2021-05-28 NOTE — Progress Notes (Signed)
[facility]) Hospital Liaison Note  Notified by Marianna Fuss of patient/family request for [facility] palliative services at home after discharge.   [facility] liaison will follow for patient discharge disposition  Please call with any outpatient palliative concerns  Thank you for the opportunity to participate in this patient's care Jhonnie Garner, RN, BSN, [place] [phone]

## 2021-05-29 ENCOUNTER — Inpatient Hospital Stay (HOSPITAL_COMMUNITY): Payer: Medicare Other

## 2021-05-29 LAB — CBC
HCT: 32.5 % — ABNORMAL LOW (ref 36.0–46.0)
Hemoglobin: 10.1 g/dL — ABNORMAL LOW (ref 12.0–15.0)
MCH: 30.3 pg (ref 26.0–34.0)
MCHC: 31.1 g/dL (ref 30.0–36.0)
MCV: 97.6 fL (ref 80.0–100.0)
Platelets: 332 10*3/uL (ref 150–400)
RBC: 3.33 MIL/uL — ABNORMAL LOW (ref 3.87–5.11)
RDW: 13.3 % (ref 11.5–15.5)
WBC: 8.1 10*3/uL (ref 4.0–10.5)
nRBC: 0 % (ref 0.0–0.2)

## 2021-05-29 LAB — HEPARIN LEVEL (UNFRACTIONATED): Heparin Unfractionated: 0.41 IU/mL (ref 0.30–0.70)

## 2021-05-29 MED ORDER — APIXABAN 5 MG PO TABS
10.0000 mg | ORAL_TABLET | Freq: Two times a day (BID) | ORAL | Status: AC
  Administered 2021-05-29 – 2021-06-05 (×13): 10 mg via ORAL
  Filled 2021-05-29 (×16): qty 2

## 2021-05-29 MED ORDER — APIXABAN 5 MG PO TABS
5.0000 mg | ORAL_TABLET | Freq: Two times a day (BID) | ORAL | Status: DC
  Administered 2021-06-06 – 2021-06-08 (×4): 5 mg via ORAL
  Filled 2021-05-29 (×4): qty 1

## 2021-05-29 MED ORDER — SALINE SPRAY 0.65 % NA SOLN
1.0000 | NASAL | Status: DC | PRN
  Filled 2021-05-29: qty 44

## 2021-05-29 MED ORDER — LORATADINE 10 MG PO TABS
10.0000 mg | ORAL_TABLET | Freq: Every day | ORAL | Status: DC
  Administered 2021-05-29 – 2021-06-08 (×11): 10 mg via ORAL
  Filled 2021-05-29 (×11): qty 1

## 2021-05-29 MED ORDER — OXYMETAZOLINE HCL 0.05 % NA SOLN
1.0000 | Freq: Two times a day (BID) | NASAL | Status: AC
  Administered 2021-05-29 – 2021-05-31 (×2): 1 via NASAL
  Filled 2021-05-29: qty 30

## 2021-05-29 NOTE — Progress Notes (Signed)
Pt struggling breathing. Pt up to 6 liters of O2 nasal cannula and maintaining at around 89 to 90. Pt chest tube is now water sealed. Chest xray was performed by IR in the last hour and wanted to continue water seal. Pt on nonrebreather now with respiration rate of 30 expressing difficulty catching her breath. Paged Dr. Karleen Hampshire. Dr. Karleen Hampshire came to bedside. Pt changed to venti mask for 30 minutes and then changed to nasal cannula 5L. Pt tolerating well.

## 2021-05-29 NOTE — Progress Notes (Signed)
Received call from IR. Verbal order was given to continue the chest tube on water seal.

## 2021-05-29 NOTE — Evaluation (Signed)
Clinical/Bedside Swallow Evaluation Patient Details  Name: Melanie [name] MRN: [id number] Date of Birth: 1941-06-28  Today's Date: 05/29/2021 Time: SLP Start Time (ACUTE ONLY): 1414 SLP Stop Time (ACUTE ONLY): 1440 SLP Time Calculation (min) (ACUTE ONLY): 26 min  Past Medical History:  Past Medical History:  Diagnosis Date  . Anxiety   . Cancer ([place])    rectal ca  . COPD (chronic obstructive pulmonary disease) ([place])   . Thyroid disease    Past Surgical History:  Past Surgical History:  Procedure Laterality Date  . ABDOMINAL HYSTERECTOMY    . COLON SURGERY    . IR THORACENTESIS ASP PLEURAL SPACE W/IMG GUIDE  05/17/2021   HPI:  Melanie [name] is a 80 y.o. female with medical history significant of COPD, multinodular goiter, essential hypertension, anxiety disorder, overactive bladder, A. fib with RVR, recent traumatic hemopneumothorax presented in the ED with bilateral leg swelling.  Patient was recently admitted to the [facility] trauma service from 05/11/21- 05/20/21 for right-sided rib fractures with hemopneumothorax following fall from a bed. Per RN pt with coughing during PO consumption and difficulty with PO meds.   Assessment / Plan / Recommendation Clinical Impression  Pt seen for clinical swallow evalution, daughter at bedside. Per RN, pt with episodic difficulty during medicine administration including O2 desaturation and coughing with PO consumption. Pt on 8 liters with venti mask. Pt denies hx of dysphagia, states at baseline consumes regular thin liquid diet, though appears malnourished. PO assessment with ice chips, thin liquids via cup and straw, puree, and solids were without overt s/sx of aspiration this date. Pt does exhibit increased time for mastications of solids and suspected delay in swallow initiation per palpation. Vocal quality remained clear. Suspect difficulty is episodic in nature. Will continue to closely monitor, if difficulty persists, instrumental  assessment may be indicated. SLP to follow up. SLP Visit Diagnosis: Dysphagia, unspecified (R13.10)    Aspiration Risk  Mild aspiration risk;Moderate aspiration risk    Diet Recommendation   Regular, thin liquids   Medication Administration: Whole meds with puree    Other  Recommendations Oral Care Recommendations: Oral care BID   Follow up Recommendations 24 hour supervision/assistance      Frequency and Duration min 2x/week  2 weeks       Prognosis Prognosis for Safe Diet Advancement: Good Barriers to Reach Goals: Time post onset      Swallow Study   General Date of Onset: 05/29/21 HPI: Melanie [name] is a 80 y.o. female with medical history significant of COPD, multinodular goiter, essential hypertension, anxiety disorder, overactive bladder, A. fib with RVR, recent traumatic hemopneumothorax presented in the ED with bilateral leg swelling.  Patient was recently admitted to the [facility] trauma service from 05/11/21- 05/20/21 for right-sided rib fractures with hemopneumothorax following fall from a bed. Per RN pt with coughing during PO consumption and difficulty with PO meds. Type of Study: Bedside Swallow Evaluation Previous Swallow Assessment: none on file Diet Prior to this Study: Regular;Thin liquids Temperature Spikes Noted: No Respiratory Status: Non-rebreather History of Recent Intubation: No Behavior/Cognition: Alert;Cooperative;Requires cueing Oral Cavity Assessment: Dry Oral Care Completed by SLP: Yes Oral Cavity - Dentition: Dentures, top;Dentures, bottom Vision: Functional for self-feeding Self-Feeding Abilities: Needs set up Patient Positioning: Upright in bed Baseline Vocal Quality: Low vocal intensity Volitional Swallow: Able to elicit    Oral/Motor/Sensory Function Overall Oral Motor/Sensory Function: Generalized oral weakness   Ice Chips Ice chips: Impaired Presentation: Spoon Oral Phase Impairments: Reduced lingual  movement/coordination Oral  Phase Functional Implications: Prolonged oral transit Pharyngeal Phase Impairments: Suspected delayed Swallow;Multiple swallows   Thin Liquid Thin Liquid: Within functional limits Presentation: Cup;Straw    Nectar Thick Nectar Thick Liquid: Not tested   Honey Thick Honey Thick Liquid: Not tested   Puree Puree: Within functional limits   Solid     Solid: Impaired Presentation: Self Fed Oral Phase Functional Implications: Prolonged oral transit;Impaired mastication Pharyngeal Phase Impairments: Multiple swallows      Cedarius Kersh H. MA, CCC-SLP Acute Rehabilitation Services   05/29/2021,2:58 PM

## 2021-05-29 NOTE — Discharge Instructions (Signed)
Information on my medicine - ELIQUIS (apixaban)  Why was Eliquis prescribed for you? Eliquis was prescribed to treat blood clots that may have been found in the veins of your legs (deep vein thrombosis) or in your lungs (pulmonary embolism) and to reduce the risk of them occurring again.  What do You need to know about Eliquis ? The starting dose is 10 mg (two 5 mg tablets) taken TWICE daily for the FIRST SEVEN (7) DAYS, then on 06/06/21 the dose is reduced to ONE 5 mg tablet taken TWICE daily.  Eliquis may be taken with or without food.   Try to take the dose about the same time in the morning and in the evening. If you have difficulty swallowing the tablet whole please discuss with your pharmacist how to take the medication safely.  Take Eliquis exactly as prescribed and DO NOT stop taking Eliquis without talking to the doctor who prescribed the medication.  Stopping may increase your risk of developing a new blood clot.  Refill your prescription before you run out.  After discharge, you should have regular check-up appointments with your healthcare provider that is prescribing your Eliquis.    What do you do if you miss a dose? If a dose of ELIQUIS is not taken at the scheduled time, take it as soon as possible on the same day and twice-daily administration should be resumed. The dose should not be doubled to make up for a missed dose.  Important Safety Information A possible side effect of Eliquis is bleeding. You should call your healthcare provider right away if you experience any of the following: ? Bleeding from an injury or your nose that does not stop. ? Unusual colored urine (red or dark brown) or unusual colored stools (red or black). ? Unusual bruising for unknown reasons. ? A serious fall or if you hit your head (even if there is no bleeding).  Some medicines may interact with Eliquis and might increase your risk of bleeding or clotting while on Eliquis. To help avoid  this, consult your healthcare provider or pharmacist prior to using any new prescription or non-prescription medications, including herbals, vitamins, non-steroidal anti-inflammatory drugs (NSAIDs) and supplements.  This website has more information on Eliquis (apixaban): http://www.eliquis.com/eliquis/home

## 2021-05-29 NOTE — Progress Notes (Signed)
Referring Physician(s): Shawna Clamp [facility])  Supervising Physician: Aletta Edouard  Patient Status:  [facility] - In-pt  Chief Complaint:  Post-traumatic right hydropneumothorax s/p right chest tube placement in IR 05/26/2021.  Subjective:  Patient laying in bed resting comfortably. She responds to voice and answers questions appropriately. No complaints. Right chest tube site c/d/i. On 4L [address], sating 100%.  CXR this AM: 1. RIGHT chest tube in place with small apical pneumothorax. No change in pneumothorax volume. 2. Decrease in RIGHT pleural fluid. 3. Multiple posterior RIGHT rib fractures.   Allergies: Latex, Macrodantin, Guaifenesin er, Morphine and related, Ditropan [oxybutynin], Lipitor [atorvastatin], Lisinopril, Magnesium-containing compounds, Pravastatin, and Zoloft [sertraline hcl]  Medications: Prior to Admission medications   Medication Sig Start Date End Date Taking? Authorizing Provider  acetaminophen (TYLENOL) 650 MG CR tablet Take 650 mg by mouth every 8 (eight) hours as needed for fever or pain.   Yes [provider]  CALCIUM-VITAMIN D PO Take 1 tablet by mouth daily.   Yes [provider]  cetirizine (ZYRTEC) 10 MG tablet Take 0.5 tablets (5 mg total) by mouth daily. Patient taking differently: Take 10 mg by mouth daily. 09/09/20  Yes Martinique, Betty G, MD  diltiazem (CARDIZEM) 30 MG tablet Take 1 tablet (30 mg total) by mouth every 6 (six) hours. 05/20/21  Yes Maczis, Barth Kirks, PA-C  docusate sodium (COLACE) 100 MG capsule Take 1 capsule (100 mg total) by mouth 2 (two) times daily as needed for mild constipation. 05/20/21  Yes Maczis, Barth Kirks, PA-C  fluticasone (FLONASE) 50 MCG/ACT nasal spray Place 1 spray into both nostrils 2 (two) times daily. Patient taking differently: Place 1 spray into both nostrils 2 (two) times daily as needed for allergies. 11/05/19  Yes Martinique, Betty G, MD  hydrocortisone cream 1 % Apply 1 application topically 4 (four)  times daily as needed for itching.   Yes [provider]  LORazepam (ATIVAN) 0.5 MG tablet Take 0.5 mg by mouth 2 (two) times daily as needed for anxiety. 04/30/21  Yes [provider]  metoprolol tartrate (LOPRESSOR) 50 MG tablet Take 1 tablet (50 mg total) by mouth 2 (two) times daily. 05/20/21  Yes Maczis, Barth Kirks, PA-C  oxyCODONE (OXY IR/ROXICODONE) 5 MG immediate release tablet Take 1 tablet (5 mg total) by mouth every 6 (six) hours as needed for breakthrough pain. 05/20/21  Yes Ileana Roup, MD  pantoprazole (PROTONIX) 20 MG tablet Take 20 mg by mouth daily. 05/10/21  Yes [provider]  polyvinyl alcohol (LIQUIFILM TEARS) 1.4 % ophthalmic solution Place 1 drop into both eyes as needed for dry eyes.   Yes [provider]  VITAMIN D PO Take 1 capsule by mouth daily.   Yes [provider]  acetaminophen (TYLENOL) 500 MG tablet Take 2 tablets (1,000 mg total) by mouth every 8 (eight) hours. Patient not taking: Reported on 05/25/2021 05/20/21   Jillyn Ledger, PA-C  LORazepam (ATIVAN) 0.5 MG tablet Take 0.5-1 tablets (0.25-0.5 mg total) by mouth 2 (two) times daily as needed. for anxiety Patient not taking: Reported on 05/25/2021 03/30/21   Martinique, Betty G, MD  losartan (COZAAR) 100 MG tablet TAKE 1 TABLET BY MOUTH EVERY DAY Patient not taking: Reported on 05/25/2021 03/22/21   [place], Betty G, MD  pantoprazole (PROTONIX) 20 MG tablet TAKE 1 TABLET BY MOUTH 30 MINUTES BEFORE BREAKFAST. STOP OMEPRAZOLE Patient not taking: Reported on 05/25/2021 05/10/21   Martin[place], Betty G, MD  polyethylene glycol [facility] /  GLYCOLAX) 17 g packet Take 17 g by mouth daily as needed. Patient not taking: Reported on 05/25/2021 05/20/21   Jillyn Ledger, PA-C     Vital Signs: BP 140/68 (BP Location: Left Arm)   Pulse (!) 105   Temp 97.7 F (36.5 C) (Oral)   Resp 18   Ht 5\' 8"  (1.727 m)   Wt 132 lb 0.9 oz (59.9 kg)   SpO2 99%   BMI 20.08 kg/m   Physical  Exam Vitals and nursing note reviewed.  Constitutional:      General: She is not in acute distress. Cardiovascular:     Rate and Rhythm: Normal rate.  Pulmonary:     Effort: Pulmonary effort is normal. No respiratory distress.     Comments: On 4L [place], sating 100%. Right chest tube site without tenderness, erythema, ative bleeding, or drainage; approximately 1050 cc serosanguinous fluid in pleure-vac; tube to suction with (-) air leak. Skin:    General: Skin is warm and dry.  Neurological:     Mental Status: She is alert and oriented to person, place, and time.     Imaging: DG Chest 2 View  Addendum Date: 05/25/2021   ADDENDUM REPORT: 05/25/2021 13:45 ADDENDUM: Critical Value/emergent results were called by telephone at the time of interpretation on 05/25/2021 at 1:45 pm to provider Silverio Decamp, PA, who verbally acknowledged these results. Electronically Signed   By: Lowella Grip III M.D.   On: 05/25/2021 13:45   Result Date: 05/25/2021 CLINICAL DATA:  Shortness of breath EXAM: CHEST - 2 VIEW COMPARISON:  May 20, 2021. FINDINGS: There is a fairly small right apical pneumothorax without tension component. There are multiple displaced rib fractures on the right. There is a moderate pleural effusion on the right with atelectasis and potential consolidation in portions of the right middle and lower lobes. Left lung clear. Heart size and pulmonary vascularity are normal. No adenopathy. There is degenerative change in the thoracic spine. IMPRESSION: Multiple displaced rib fractures on the right with right pleural effusion and fairly small right apical pneumothorax. No tension component. Atelectasis with potential consolidation in portions of the right middle and lower lobes. Left lung clear. Heart size normal. Aortic Atherosclerosis (ICD10-I70.0). Electronically Signed: By: Lowella Grip III M.D. On: 05/25/2021 13:31   CT Angio Chest PE W/Cm &/Or Wo Cm  Result Date:  05/25/2021 CLINICAL DATA:  Shortness of breath. EXAM: CT ANGIOGRAPHY CHEST WITH CONTRAST TECHNIQUE: Multidetector CT imaging of the chest was performed using the standard protocol during bolus administration of intravenous contrast. Multiplanar CT image reconstructions and MIPs were obtained to evaluate the vascular anatomy. CONTRAST:  65mL OMNIPAQUE IOHEXOL 350 MG/ML SOLN COMPARISON:  May 11, 2021. FINDINGS: Cardiovascular: Satisfactory opacification of the pulmonary arteries to the segmental level. No evidence of pulmonary embolism. Normal heart size. No pericardial effusion. Atherosclerosis of thoracic aorta is noted without aneurysm or dissection. Mediastinum/Nodes: Stable findings consistent with multinodular goiter. No adenopathy is noted. Esophagus is unremarkable. Lungs/Pleura: Left lung is clear. Moderate right hydropneumothorax is noted with loculated effusion seen in the right lower lobe and associated atelectasis of the right lower lobe. Upper Abdomen: No acute abnormality. Musculoskeletal: Stable appearance of right segmental rib fractures and flail chest as noted on prior exam. Review of the MIP images confirms the above findings. IMPRESSION: No definite evidence of pulmonary embolus. Moderate right hydropneumothorax is noted with loculated effusion seen in the right lower lobe and associated atelectasis of the right lower lobe. Stable appearance of right  segmental rib fractures and flail chest is noted as described on prior exam. Aortic Atherosclerosis (ICD10-I70.0). Electronically Signed   By: Marijo Conception M.D.   On: 05/25/2021 16:38   DG Chest Port 1 View  Result Date: 05/29/2021 CLINICAL DATA:  Short of breath. RIGHT chest tube placement. Multiple RIGHT rib fractures. EXAM: PORTABLE CHEST 1 VIEW COMPARISON:  CT 05/25/2021, chest radiograph 63 22 FINDINGS: Stable cardiac silhouette. Small bore RIGHT chest tube in place. Decrease in RIGHT pleural fluid volume. Small RIGHT apical pneumothorax  remains unchanged in volume measuring approximately 6 mm from the apical chest wall. Multiple posterior RIGHT rib fractures again noted. IMPRESSION: 1. RIGHT chest tube in place with small apical pneumothorax. No change in pneumothorax volume. 2. Decrease in RIGHT pleural fluid. 3. Multiple posterior RIGHT rib fractures. Electronically Signed   By: Suzy Bouchard M.D.   On: 05/29/2021 09:17   DG Chest Port 1 View  Result Date: 05/28/2021 CLINICAL DATA:  Chest tube EXAM: PORTABLE CHEST 1 VIEW COMPARISON:  05/27/2021 FINDINGS: Right basilar chest tube is present. Similar residual right apical pneumothorax. Similar small right pleural effusion and right basilar atelectasis/consolidation. Stable cardiomediastinal contours. IMPRESSION: Similar residual right apical pneumothorax with basilar chest tube present. Similar small right pleural effusion and right basilar atelectasis/consolidation. Electronically Signed   By: Macy Mis M.D.   On: 05/28/2021 08:09   DG CHEST PORT 1 VIEW  Result Date: 05/27/2021 CLINICAL DATA:  Pneumothorax. EXAM: PORTABLE CHEST 1 VIEW COMPARISON:  May 25, 2021. FINDINGS: Stable cardiomediastinal silhouette. Interval placement of pigtail drainage catheter in the right lung base. Right pleural effusion is significantly smaller compared to prior exam. Grossly stable small right apical pneumothorax is noted. Bony thorax is unremarkable. IMPRESSION: Interval placement of pigtail drainage catheter in right lung base. Right pleural effusion is significantly smaller. Grossly stable small right apical pneumothorax. Aortic Atherosclerosis (ICD10-I70.0). Electronically Signed   By: Marijo Conception M.D.   On: 05/27/2021 08:59   CT [facility] PLEURAL DRAIN W/INDWELL CATH W/IMG GUIDE  Result Date: 05/26/2021 INDICATION: 80 year old female with posttraumatic right hydropneumothorax. She has multiple severely displaced rib fractures and is not a candidate for percutaneous thoracentesis. She presents  for chest tube placement. EXAM: Placement of chest tube with CT guidance MEDICATIONS: None ANESTHESIA/SEDATION: Fentanyl 37.5 mcg IV; Versed 0.5 mg IV Moderate Sedation Time:  11 minutes The patient was continuously monitored during the procedure by the interventional radiology nurse under my direct supervision. COMPLICATIONS: None immediate. PROCEDURE: Informed written consent was obtained from the patient after a thorough discussion of the procedural risks, benefits and alternatives. All questions were addressed. Maximal Sterile Barrier Technique was utilized including caps, mask, sterile gowns, sterile gloves, sterile drape, hand hygiene and skin antiseptic. A timeout was performed prior to the initiation of the procedure. An axial CT scan was performed. The large right-sided hydropneumothorax was successfully identified. A suitable entry site over a rib along the right lateral chest wall inferior to the breast was identified. The skin entry site was marked and then the skin was sterilely prepped and draped in the standard fashion using chlorhexidine skin prep. Local anesthesia was attained by infiltration with 1% lidocaine. A small dermatotomy was made. Using trocar technique, a Cook 12 [place] all-purpose drainage catheter was advanced over the rib and positioned in the pleural fluid. The catheter was connected to low wall suction via a Pleur Evac. The catheter was secured to the skin with 0 Prolene suture. Sterile bandages were applied. Post placement  CT scanning demonstrates a well-positioned chest tube in the dependent aspect of the pleural effusion. IMPRESSION: Successful placement of 12 French chest tube with CT guidance. Electronically Signed   By: Jacqulynn Cadet M.D.   On: 05/26/2021 11:11   VAS [place] LOWER EXTREMITY VENOUS (DVT) (ONLY [facility] & [facility] 7a-7p)  Result Date: 05/25/2021  Lower Venous DVT Study Patient Name:  MALEIYA PERGOLA Keel  Date of Exam:   05/25/2021 Medical Rec #: [id number]          Accession  #:    [id number] Date of Birth: 1941/12/11           Patient Gender: F Patient Age:   79Y Exam Location:  [facility] Procedure:      VAS [place] LOWER EXTREMITY VENOUS (DVT) Referring Phys: [id number] Barrett --------------------------------------------------------------------------------  Indications: Swelling.  Risk Factors: None identified. Comparison Study: No prior studies. Performing Technologist: Oliver Hum RVT  Examination Guidelines: A complete evaluation includes B-mode imaging, spectral Doppler, color Doppler, and power Doppler as needed of all accessible portions of each vessel. Bilateral testing is considered an integral part of a complete examination. Limited examinations for reoccurring indications may be performed as noted. The reflux portion of the exam is performed with the patient in reverse Trendelenburg.  +---------+---------------+---------+-----------+----------+--------------+ RIGHT    CompressibilityPhasicitySpontaneityPropertiesThrombus Aging +---------+---------------+---------+-----------+----------+--------------+ CFV      Full           Yes      Yes                                 +---------+---------------+---------+-----------+----------+--------------+ SFJ      Full                                                        +---------+---------------+---------+-----------+----------+--------------+ FV Prox  Full                                                        +---------+---------------+---------+-----------+----------+--------------+ FV Mid   Full                                                        +---------+---------------+---------+-----------+----------+--------------+ FV DistalFull                                                        +---------+---------------+---------+-----------+----------+--------------+ PFV      Full                                                         +---------+---------------+---------+-----------+----------+--------------+ POP      Full  Yes      Yes                                 +---------+---------------+---------+-----------+----------+--------------+ PTV      Full                                                        +---------+---------------+---------+-----------+----------+--------------+ PERO     Full                                                        +---------+---------------+---------+-----------+----------+--------------+ Gastroc  Partial                                      Acute          +---------+---------------+---------+-----------+----------+--------------+   +---------+---------------+---------+-----------+----------+--------------+ LEFT     CompressibilityPhasicitySpontaneityPropertiesThrombus Aging +---------+---------------+---------+-----------+----------+--------------+ CFV      Full           Yes      Yes                                 +---------+---------------+---------+-----------+----------+--------------+ SFJ      Full                                                        +---------+---------------+---------+-----------+----------+--------------+ FV Prox  Full                                                        +---------+---------------+---------+-----------+----------+--------------+ FV Mid   Full                                                        +---------+---------------+---------+-----------+----------+--------------+ FV DistalFull                                                        +---------+---------------+---------+-----------+----------+--------------+ PFV      Full                                                        +---------+---------------+---------+-----------+----------+--------------+ POP      Full  Yes      Yes                                  +---------+---------------+---------+-----------+----------+--------------+ PTV      Full                                                        +---------+---------------+---------+-----------+----------+--------------+ PERO     Full                                                        +---------+---------------+---------+-----------+----------+--------------+     Summary: RIGHT: - Findings consistent with acute deep vein thrombosis involving the right gastrocnemius veins. - No cystic structure found in the popliteal fossa.  LEFT: - There is no evidence of deep vein thrombosis in the lower extremity.  - No cystic structure found in the popliteal fossa.  *See table(s) above for measurements and observations. Electronically signed by Deitra Mayo MD on 05/25/2021 at 6:43:30 PM.    Final     Labs:  CBC: Recent Labs    05/26/21 0330 05/27/21 0154 05/28/21 0347 05/29/21 0241  WBC 11.3* 8.2 8.0 8.1  HGB 10.9* 9.7* 10.2* 10.1*  HCT 33.9* 31.1* 32.1* 32.5*  PLT 453* 376 339 332    COAGS: Recent Labs    05/11/21 0035  INR 1.0    BMP: Recent Labs    09/09/20 1222 03/30/21 1301 05/17/21 0921 05/18/21 0553 05/25/21 1356 05/26/21 0330  NA 136   < > 135 135 134* 134*  K 4.0   < > 3.3* 4.5 4.6 4.7  CL 99   < > 96* 93* 94* 90*  CO2 32   < > 32 35* 33* 34*  GLUCOSE 88   < > 106* 106* 111* 122*  BUN 18   < > 17 19 19 19   CALCIUM 9.9   < > 9.3 9.0 9.5 9.5  CREATININE 0.87   < > 0.69 0.77 0.72 0.66  GFRNONAA 62   < > >60 >60 >60 >60  GFRAA 72  --   --   --   --   --    < > = values in this interval not displayed.    LIVER FUNCTION TESTS: Recent Labs    03/30/21 1301 05/11/21 0035 05/25/21 1358 05/26/21 0330  BILITOT 0.5 0.5 0.6 0.4  AST 18 25 26 20   ALT 10 13 14 19   ALKPHOS 52 54 53 52  PROT 7.3 6.7 6.5 6.4*  ALBUMIN 4.4 3.6 3.2* 3.1*    Assessment and Plan:  Post-traumatic right hydropneumothorax s/p right chest tube placement in IR 05/26/2021. Right  chest tube stable with approximately 1050 cc serosanguinous fluid in pleure-vac, tube to suction with (-) air leak. CXR this AM with no change in small apical PTX. Discussed case with Dr. Kathlene Cote who recommends water seal trial at this time. Brooke, RN aware to switch to water seal at this time, and to switch back to suction/page IR if patient develops acute dyspnea. Continue chest tube management- tube to water seal, continue with Qshift monitor  of output, F/U CXR ordered for tomorrow AM. Further plans per [facility]- appreciate and agree with management. IR to follow.   Electronically Signed: Earley Abide, PA-C 05/29/2021, 9:54 AM   I spent a total of 15 Minutes at the the patient's bedside AND on the patient's hospital floor or unit, greater than 50% of which was counseling/coordinating care for right hydropneumothorax s/p right chest tube placement.

## 2021-05-29 NOTE — Progress Notes (Signed)
Interventional radiology contacted me at 1015 advising to place pt on water seal. Pt's chest tube placed on water seal. Pt tolerated well until around 1120.Pt started to experience difficulty breathing. O2 saturation maintained around 96 on 4L. IR paged and STAT chest xray was performed. Pt's breathing has improved and is not labored.

## 2021-05-29 NOTE — Progress Notes (Signed)
PROGRESS NOTE    Melanie [name]  NUU:[id number] DOB: August 13, 1941 DOA: 05/25/2021 PCP: [place], Betty G, MD    Chief Complaint  Patient presents with  . Leg Swelling    Brief Narrative:   Melanie [name] is a 80 y.o. female with medical history significant of COPD, multinodular goiter, essential hypertension, anxiety disorder, overactive bladder, A. fib with RVR, recent traumatic hemopneumothorax presented in the ED with bilateral leg swelling.   Patient was recently admitted to the [facility] trauma service from 05/11/21- 05/20/21 for right-sided rib fractures with hemopneumothorax following fall from a bed.  During hospitalization patient was intubated and then extubated, She had a right pigtail drain placed and removed and underwent IR thoracocentesis for right pleural effusion.  During that admission Patient has also developed A. fib with RVR which was evaluated by cardiology Dr. Einar Gip and placed on metoprolol and Cardizem with recommendation for outpatient follow-up.  She also required supplemental oxygen during that admission and was discharged home on 3 L of oxygen. Daughter in law reports intermittent shortness of breath with exertion, and progressive increase in leg swelling after she is discharged from hospital.  Her oxygen requirement has increased to 4 L today. CT chest: No definite evidence of pulmonary embolus. Moderate right hydropneumothorax is noted with loculated effusionseen in the right lower lobe and associated atelectasis of the right lower lobe. Stable appearance of right segmental rib fractures and flail chest is noted as described on prior exam. She underwent chest tube placement by IR.  Her breathing has improved she is more alert and participating in physical therapy. In view of her deconditioning palliative care consulted , recommended outpatient palliative follow up on discharge.   Earlier this afternoon, pt had a coughing spell and required NRB for a few minutes an  later on transitioned to [place] oxygen.      Assessment & Plan:   Principal Problem:   Atrial fibrillation with RVR (HCC) Active Problems:   Primary hypertension   Hypercholesteremia   Multinodular goiter   Overactive bladder   GERD (gastroesophageal reflux disease)   Peripheral neuropathy due to chemotherapy (HCC)   Anxiety disorder   Closed fracture of multiple ribs with flail chest   Hydropneumothorax   Malnutrition of moderate degree   Atrial fibrillation with RVR Rate controlled, she initially required IV Cardizem, transition to oral Cardizem and metoprolol. Heart rate still between 100-1 20.  patient was started on heparin GTT.  Will transition to Eliquis today.  Echocardiogram revealed preserved left ventricular ejection fraction. Transition to oral cardizem 120 mg daily and metoprolol 50 mg BID.    Newly diagnosed right leg DVT Start the patient on IV heparin, transition to eliquis.    Right-sided hydropneumothorax with loculated effusions IR consulted.  Patient underwent chest tube placement for further evacuation of the effusion. Trauma surgery on board and appreciate recommendations.  Interval placement of pigtail drainage catheter in right lung base.   CXR showed small apical pneumothorax. The chest tube was put under water seal. Repeat CXR is stable.  She had a coughing spell and required NRB for a few min and transitioned to [place] oxygen.  Continue with nasal cannula oxygen to keep sats greater than 90% and monitor output Pain control   Essential hypertension Well controlled BP parameters.     GERD Continue with Protonix   Anxiety Continue with Ativan.    Normocytic anemia Baseline hemoglobin is around 12, dropped to 10.  transfuse to keep hemoglobin greater than  7.  Continue to monitor    DVT prophylaxis: Heparin Code Status:  Full code Family Communication: none at bedside.  Disposition:   Status is: Inpatient  Remains inpatient  appropriate because:Ongoing diagnostic testing needed not appropriate for outpatient work up and Unsafe d/c plan   Dispo: The patient is from: Home              Anticipated d/c is to: Home              Patient currently is not medically stable to d/c.   Difficult to place patient No       Consultants:   IR  Surgery    Procedures: chest tube placement.  Antimicrobials:none.   Subjective: Sob tired. No chest pain . slp evaluation ordered as she is coughing while eating.   Objective: Vitals:   05/28/21 2340 05/29/21 0612 05/29/21 0800 05/29/21 0824  BP: (!) 153/66 (!) 158/70  140/68  Pulse: (!) 109 (!) 105  (!) 105  Resp: 20 19  18   Temp: 98 F (36.7 C) 98.1 F (36.7 C)  97.7 F (36.5 C)  TempSrc: Oral Oral  Oral  SpO2: 97% 91% 99%   Weight:      Height:        Intake/Output Summary (Last 24 hours) at 05/29/2021 1559 Last data filed at 05/29/2021 0824 Gross per 24 hour  Intake 453.81 ml  Output 840 ml  Net -386.19 ml   Filed Weights   05/25/21 1745 05/26/21 1200  Weight: 61.2 kg 59.9 kg    Examination:  General exam cachectic looking woman comfortable not in any distress Respiratory system: Diminished air entry at bases, on 4 L of nasal cannula oxygen, tachypnea present Cardiovascular system: S1 and S2 heard, irregularly irregular, tachycardic, no JVD Gastrointestinal system: Abdomen is soft nontender nondistended bowel sounds normal Central nervous system: Alert and answering simple questions Extremities: No pedal edema Skin: No rashes seen psychiatry: Appropriate    Data Reviewed: I have personally reviewed following labs and imaging studies  CBC: Recent Labs  Lab 05/25/21 1356 05/26/21 0330 05/27/21 0154 05/28/21 0347 05/29/21 0241  WBC 8.8 11.3* 8.2 8.0 8.1  NEUTROABS 7.5  --   --   --   --   HGB 11.1* 10.9* 9.7* 10.2* 10.1*  HCT 34.5* 33.9* 31.1* 32.1* 32.5*  MCV 95.0 94.4 96.0 96.4 97.6  PLT 403* 453* 376 339 761    Basic Metabolic  Panel: Recent Labs  Lab 05/25/21 1356 05/26/21 0330  NA 134* 134*  K 4.6 4.7  CL 94* 90*  CO2 33* 34*  GLUCOSE 111* 122*  BUN 19 19  CREATININE 0.72 0.66  CALCIUM 9.5 9.5  MG  --  2.0  PHOS  --  3.9    GFR: Estimated Creatinine Clearance: 52.2 mL/min (by C-G formula based on SCr of 0.66 mg/dL).  Liver Function Tests: Recent Labs  Lab 05/25/21 1358 05/26/21 0330  AST 26 20  ALT 14 19  ALKPHOS 53 52  BILITOT 0.6 0.4  PROT 6.5 6.4*  ALBUMIN 3.2* 3.1*    CBG: No results for input(s): GLUCAP in the last 168 hours.   Recent Results (from the past 240 hour(s))  Resp Panel by RT-PCR (Flu A&B, Covid) Nasopharyngeal Swab     Status: None   Collection Time: 05/25/21  1:21 PM   Specimen: Nasopharyngeal Swab; Nasopharyngeal(NP) swabs in vial transport medium  Result Value Ref Range Status   SARS Coronavirus 2 by RT PCR  NEGATIVE NEGATIVE Final    Comment: (NOTE) SARS-CoV-2 target nucleic acids are NOT DETECTED.  The SARS-CoV-2 RNA is generally detectable in upper respiratory specimens during the acute phase of infection. The lowest concentration of SARS-CoV-2 viral copies this assay can detect is 138 copies/mL. A negative result does not preclude SARS-Cov-2 infection and should not be used as the sole basis for treatment or other patient management decisions. A negative result may occur with  improper specimen collection/handling, submission of specimen other than nasopharyngeal swab, presence of viral mutation(s) within the areas targeted by this assay, and inadequate number of viral copies(<138 copies/mL). A negative result must be combined with clinical observations, patient history, and epidemiological information. The expected result is Negative.  Fact Sheet for Patients:  EntrepreneurPulse.com.au  Fact Sheet for Healthcare Providers:  IncredibleEmployment.be  This test is no t yet approved or cleared by the [place] FDA  and  has been authorized for detection and/or diagnosis of SARS-CoV-2 by FDA under an Emergency Use Authorization (EUA). This EUA will remain  in effect (meaning this test can be used) for the duration of the COVID-19 declaration under Section 564(b)(1) of the Act, 21 U.S.C.section 360bbb-3(b)(1), unless the authorization is terminated  or revoked sooner.       Influenza A by PCR NEGATIVE NEGATIVE Final   Influenza B by PCR NEGATIVE NEGATIVE Final    Comment: (NOTE) The Xpert Xpress SARS-CoV-2/FLU/RSV plus assay is intended as an aid in the diagnosis of influenza from Nasopharyngeal swab specimens and should not be used as a sole basis for treatment. Nasal washings and aspirates are unacceptable for Xpert Xpress SARS-CoV-2/FLU/RSV testing.  Fact Sheet for Patients: EntrepreneurPulse.com.au  Fact Sheet for Healthcare Providers: IncredibleEmployment.be  This test is not yet approved or cleared by the [place] FDA and has been authorized for detection and/or diagnosis of SARS-CoV-2 by FDA under an Emergency Use Authorization (EUA). This EUA will remain in effect (meaning this test can be used) for the duration of the COVID-19 declaration under Section 564(b)(1) of the Act, 21 U.S.C. section 360bbb-3(b)(1), unless the authorization is terminated or revoked.  Performed at [facility], [employer] [address], [place], [place] [address]          Radiology Studies: [facility] Chest Port 1 View  Result Date: 05/29/2021 CLINICAL DATA:  Shortness of breath EXAM: PORTABLE CHEST 1 VIEW COMPARISON:  May 29, 2021 study obtained earlier in the day. FINDINGS: Chest tube again noted on the right with small right apical pneumothorax, stable. There are multiple displaced rib fractures on the right. There is ill-defined airspace opacity in the right lower lung region which likely represents an area of contusion with potential associated loculated  pleural fluid. No new opacity evident on either side. Left lung clear. Heart upper normal in size with pulmonary vascularity normal. No adenopathy. There is aortic atherosclerosis. IMPRESSION: Stable chest tube placement on the right with small right apical pneumothorax, stable. No tension component. Opacity right base laterally likely represents combination of loculated fluid and contusion. Note multiple displaced rib fractures on the right. No new opacity evident. Stable cardiac silhouette. Aortic Atherosclerosis (ICD10-I70.0). Electronically Signed   By: Lowella Grip III M.D.   On: 05/29/2021 11:50   DG Chest Port 1 View  Result Date: 05/29/2021 CLINICAL DATA:  Short of breath. RIGHT chest tube placement. Multiple RIGHT rib fractures. EXAM: PORTABLE CHEST 1 VIEW COMPARISON:  CT 05/25/2021, chest radiograph 63 22 FINDINGS: Stable cardiac silhouette. Small bore RIGHT chest tube  in place. Decrease in RIGHT pleural fluid volume. Small RIGHT apical pneumothorax remains unchanged in volume measuring approximately 6 mm from the apical chest wall. Multiple posterior RIGHT rib fractures again noted. IMPRESSION: 1. RIGHT chest tube in place with small apical pneumothorax. No change in pneumothorax volume. 2. Decrease in RIGHT pleural fluid. 3. Multiple posterior RIGHT rib fractures. Electronically Signed   By: Suzy Bouchard M.D.   On: 05/29/2021 09:17   DG Chest Port 1 View  Result Date: 05/28/2021 CLINICAL DATA:  Chest tube EXAM: PORTABLE CHEST 1 VIEW COMPARISON:  05/27/2021 FINDINGS: Right basilar chest tube is present. Similar residual right apical pneumothorax. Similar small right pleural effusion and right basilar atelectasis/consolidation. Stable cardiomediastinal contours. IMPRESSION: Similar residual right apical pneumothorax with basilar chest tube present. Similar small right pleural effusion and right basilar atelectasis/consolidation. Electronically Signed   By: Macy Mis M.D.   On:  05/28/2021 08:09        Scheduled Meds: . acetaminophen  1,000 mg Oral Q8H  . diltiazem  30 mg Oral Q6H  . feeding supplement  237 mL Oral TID BM  . loratadine  10 mg Oral Daily  . losartan  100 mg Oral Daily  . mouth rinse  15 mL Mouth Rinse BID  . metoprolol tartrate  50 mg Oral BID  . oxymetazoline  1 spray Each Nare BID  . pantoprazole  40 mg Oral Daily   Continuous Infusions: . heparin 1,200 Units/hr (05/29/21 0546)     LOS: 4 days      Hosie Poisson, MD [facility]   To contact the attending provider between 7A-7P or the covering provider during after hours 7P-7A, please log into the web site [email or web address] and access using universal [place] password for that web site. If you do not have the password, please call the hospital operator.  05/29/2021, 3:59 PM

## 2021-05-29 NOTE — Progress Notes (Signed)
IR.  Post-traumatic right hydropneumothorax s/p right chest tube placement in IR 05/26/2021.  Received call from [place], [place] stating patient having dyspnea with eating. STAT CXR ordered, PTX unchanged.  Discussed with Dr. Kathlene Cote- chest tube to remain to water seal. Repeat CXR in AM. Primary team to manage possible speech consult. Jerene Pitch, RN aware.  IR to follow.   Bea Graff Jancarlos Thrun, PA-C 05/29/2021, 1:42 PM

## 2021-05-29 NOTE — Progress Notes (Signed)
[place] for heparin Indication: acute  DVT, afib  Allergies  Allergen Reactions  . Latex Rash  . Macrodantin Shortness Of Breath  . Guaifenesin Er Itching  . Morphine And Related Nausea Only  . Ditropan [Oxybutynin]     Causes diarrhea - contains Magnesium  . Lipitor [Atorvastatin]     rash  . Lisinopril     Diarrhea/cough  . Magnesium-Containing Compounds     Causes diarrhea  . Pravastatin     Leg cramps  . Zoloft [Sertraline Hcl]     Diarrhea    Patient Measurements: Height: 5\' 8"  (172.7 cm) Weight: 59.9 kg (132 lb 0.9 oz) IBW/kg (Calculated) : 63.9 Heparin Dosing Weight: 61 kg  Vital Signs: Temp: 97.7 F (36.5 C) (06/04 0824) Temp Source: Oral (06/04 0824) BP: 140/68 (06/04 0824) Pulse Rate: 105 (06/04 0824)  Labs: Recent Labs    05/27/21 0154 05/28/21 0347 05/29/21 0241  HGB 9.7* 10.2* 10.1*  HCT 31.1* 32.1* 32.5*  PLT 376 339 332  HEPARINUNFRC 0.28* 0.51 0.41    Estimated Creatinine Clearance: 52.2 mL/min (by C-G formula based on SCr of 0.66 mg/dL).   Assessment: Patient is an 80 y.o F who was hospitalized a week prior to this admission for fall with right rib fractures and hemopneumothorax, presented to the ED on 5/31 with c/o LE swelling and SOB.  LE doppler showed "acute deep vein thrombosis involving the right  gastrocnemius veins."  Pharmacy has been consulted to dose heparin for acute DVT and afib.   Plans to switch to apixaban -Hg= 10.1  Goal of Therapy:  Heparin level 0.3-0.7 units/ml Monitor platelets by anticoagulation protocol: Yes   Plan:  -Stop heparin  -apixaban 10mg  po bid for 7 days then 5mg  bid  Hildred Laser, PharmD Clinical Pharmacist **Pharmacist phone directory can now be found on [email or web address] ([email or web address]).  Listed under [place].

## 2021-05-29 NOTE — Progress Notes (Signed)
[place] for heparin Indication: acute  DVT, afib  Allergies  Allergen Reactions  . Latex Rash  . Macrodantin Shortness Of Breath  . Guaifenesin Er Itching  . Morphine And Related Nausea Only  . Ditropan [Oxybutynin]     Causes diarrhea - contains Magnesium  . Lipitor [Atorvastatin]     rash  . Lisinopril     Diarrhea/cough  . Magnesium-Containing Compounds     Causes diarrhea  . Pravastatin     Leg cramps  . Zoloft [Sertraline Hcl]     Diarrhea    Patient Measurements: Height: 5\' 8"  (172.7 cm) Weight: 59.9 kg (132 lb 0.9 oz) IBW/kg (Calculated) : 63.9 Heparin Dosing Weight: 61 kg  Vital Signs: Temp: 98.1 F (36.7 C) (06/04 0612) Temp Source: Oral (06/04 0612) BP: 158/70 (06/04 0612) Pulse Rate: 105 (06/04 0612)  Labs: Recent Labs    05/27/21 0154 05/28/21 0347 05/29/21 0241  HGB 9.7* 10.2* 10.1*  HCT 31.1* 32.1* 32.5*  PLT 376 339 332  HEPARINUNFRC 0.28* 0.51 0.41    Estimated Creatinine Clearance: 52.2 mL/min (by C-G formula based on SCr of 0.66 mg/dL).   Assessment: Patient is an 80 y.o F who was hospitalized a week prior to this admission for fall with right rib fractures and hemopneumothorax, presented to the ED on 5/31 with c/o LE swelling and SOB.  LE doppler showed "acute deep vein thrombosis involving the right  gastrocnemius veins."  Pharmacy has been consulted to dose heparin for acute DVT and afib.   Heparin level remains therapeutic at 0.41 on gtt at 1200 units/hr. CBC stable.   Goal of Therapy:  Heparin level 0.3-0.7 units/ml Monitor platelets by anticoagulation protocol: Yes   Plan:  Continue heparin at 1200 units/hr - Monitor for s/sx bleeding - Daily heparin level and CBC  Rebbeca Paul, PharmD PGY1 Pharmacy Resident 05/29/2021 7:31 AM  Please check [email or web address] for unit-specific pharmacy phone numbers.

## 2021-05-29 NOTE — Progress Notes (Signed)
Verbal order given by Dr. Karleen Hampshire for speech eval due to pt having difficulty with swallowing medication and food.

## 2021-05-30 ENCOUNTER — Inpatient Hospital Stay (HOSPITAL_COMMUNITY): Payer: Medicare Other

## 2021-05-30 LAB — BASIC METABOLIC PANEL
Anion gap: 8 (ref 5–15)
BUN: 24 mg/dL — ABNORMAL HIGH (ref 8–23)
CO2: 36 mmol/L — ABNORMAL HIGH (ref 22–32)
Calcium: 9.6 mg/dL (ref 8.9–10.3)
Chloride: 99 mmol/L (ref 98–111)
Creatinine, Ser: 0.6 mg/dL (ref 0.44–1.00)
GFR, Estimated: >60 mL/min (ref >60)
Glucose, Bld: 113 mg/dL — ABNORMAL HIGH (ref 70–99)
Potassium: 4.2 mmol/L (ref 3.5–5.1)
Sodium: 143 mmol/L (ref 135–145)

## 2021-05-30 LAB — CBC
HCT: 31.6 % — ABNORMAL LOW (ref 36.0–46.0)
Hemoglobin: 9.7 g/dL — ABNORMAL LOW (ref 12.0–15.0)
MCH: 29.8 pg (ref 26.0–34.0)
MCHC: 30.7 g/dL (ref 30.0–36.0)
MCV: 96.9 fL (ref 80.0–100.0)
Platelets: 272 10*3/uL (ref 150–400)
RBC: 3.26 MIL/uL — ABNORMAL LOW (ref 3.87–5.11)
RDW: 13.3 % (ref 11.5–15.5)
WBC: 6.7 10*3/uL (ref 4.0–10.5)
nRBC: 0 % (ref 0.0–0.2)

## 2021-05-30 LAB — HEPARIN LEVEL (UNFRACTIONATED): Heparin Unfractionated: >1.1 IU/mL — ABNORMAL HIGH (ref 0.30–0.70)

## 2021-05-30 MED ORDER — DILTIAZEM HCL 60 MG PO TABS
60.0000 mg | ORAL_TABLET | Freq: Four times a day (QID) | ORAL | Status: AC
  Administered 2021-05-30 – 2021-06-05 (×22): 60 mg via ORAL
  Filled 2021-05-30 (×25): qty 1

## 2021-05-30 NOTE — Progress Notes (Signed)
Mobility Specialist - Progress Note   05/30/21 1130  Mobility  Activity Transferred to/from [facility]  Level of Assistance Contact guard assist, steadying assist  Assistive Device [facility]  Mobility Out of bed for toileting  Mobility Response Tolerated well  Mobility performed by Mobility specialist;Nurse;Nurse tech  $Mobility charge 1 Mobility   Assisted RN to transfer pt to [facility]. She did not require assistance for bed mobility or to stand for bed, but steadying assistance to correct a posterior lean while pivoting to BSC. Pt left w/ RN and NT in room.   Pricilla Handler Mobility Specialist Mobility Specialist Phone: [phone]

## 2021-05-30 NOTE — Progress Notes (Signed)
Occupational Therapy Treatment Patient Details Name: Melanie [name] MRN: [id number] DOB: [birth year] Today's Date: 05/30/2021    History of present illness Pt is an 80 y.o. female who recently was admitted to [facility] from 05/11/2021-05/20/2021 with bilateral leg swelling following R-sided rib fractures with traumatic hemopneumothorax. During that admission pt developed a fib with RVR. Daughter reports that pt experiences intermitten SOB with exertion and progressive increase in leg swelling following discharge from hospital, re-admitted on 05/25/2021. Moderate right hydropneumothorax on CT with loculated effusion in the right lower lobe and associated atelectasis of the right lower lobe. Stable appearance of right segmental rib fractures and flail chest. PMH significant for COPD, multinodular goiter, essential hypertension, anxiety disorder, overactive bladder, A. fib with RVR.   OT comments  Pt. Seen for skilled OT treatment session.  Per rn okay to attempt "light" therapy with bed level preferred.  Pt. Receptive to grooming task and was able to complete washing face and hands with set up.  Talking throughout session and states she loves to have visitors and people to talk with.  States she is tired and wants to take a nap but did not want the lights dimmed or off.  Utilized bed functions to make pt. More comfortable.  Will continue with current OT goals and modify as needed.    Follow Up Recommendations  Home health OT;Supervision/Assistance - 24 hour    Equipment Recommendations  None recommended by OT    Recommendations for Other Services      Precautions / Restrictions Precautions Precautions: Fall;Other (comment) Precaution Comments: chest tube to wall suction       Mobility Bed Mobility               General bed mobility comments: utilized bed functions to make pt. more comfortable with back and knees    Transfers                 General transfer comment: deferred this  session due to pt labored breathing and fatigue-rn also indicated to keep session with minimal mobility or movement    Balance                                           ADL either performed or assessed with clinical judgement   ADL Overall ADL's : Needs assistance/impaired     Grooming: Wash/dry face;Wash/dry hands;Set up;Bed level                                       Vision       Perception     Praxis      Cognition Arousal/Alertness: Lethargic Behavior During Therapy: WFL for tasks assessed/performed Overall Cognitive Status: No family/caregiver present to determine baseline cognitive functioning                                 General Comments: talking throughout session and stating she loves to have people visit and talk with her.        Exercises     Shoulder Instructions       General Comments  speaking throughout session about her children, grand, and great grand children. Sharing stories about their accomplishments and how proud she is of all  of them.      Pertinent Vitals/ Pain       Pain Assessment: No/denies pain  Home Living                                          Prior Functioning/Environment              Frequency  Min 2X/week        Progress Toward Goals  OT Goals(current goals can now be found in the care plan section)  Progress towards OT goals: Progressing toward goals     Plan Discharge plan remains appropriate    Co-evaluation                 AM-PAC OT "6 Clicks" Daily Activity     Outcome Measure   Help from another person eating meals?: A Little Help from another person taking care of personal grooming?: A Little Help from another person toileting, which includes using toliet, bedpan, or urinal?: A Little Help from another person bathing (including washing, rinsing, drying)?: A Lot Help from another person to put on and taking off regular upper  body clothing?: A Little Help from another person to put on and taking off regular lower body clothing?: A Lot 6 Click Score: 16    End of Session Equipment Utilized During Treatment: Oxygen  OT Visit Diagnosis: Unsteadiness on feet (R26.81);Pain;Repeated falls (R29.6) Pain - Right/Left: Right   Activity Tolerance Patient limited by fatigue   Patient Left in bed;with call bell/phone within reach;with bed alarm set;Other (comment) (pharmacist entering room to meet with pt. at end of session)   Nurse Communication Other (comment) (rn states "light,minimal" therapy allowed bed level)        Time: 8466-5993 OT Time Calculation (min): 13 min  Charges: OT General Charges $OT Visit: 1 Visit OT Treatments $Self Care/Home Management : 8-22 mins  Sonia Baller, COTA/L Acute Rehabilitation [phone]   Clearnce Sorrel 05/30/2021, 11:20 AM

## 2021-05-30 NOTE — Progress Notes (Signed)
Referring Physician(s): Shawna Clamp [facility])  Supervising Physician: Aletta Edouard  Patient Status:  Melanie [name] - In-pt  Chief Complaint:  Post-traumatic right hydropneumothorax s/p right chest tube placement in IR 05/26/2021.  Subjective:  Patient awake and alert sitting in bed. Looks better today (more alert/awake). No complaints. Right chest tube site c/d/i. On 4L [place], sating 100%.  CXR this AM: 1. Chest tube in place on the RIGHT with no pneumothorax appreciated. 2. Multiple posterior RIGHT rib fractures and RIGHT basilar atelectasis.   Allergies: Latex, Macrodantin, Guaifenesin er, Morphine and related, Ditropan [oxybutynin], Lipitor [atorvastatin], Lisinopril, Magnesium-containing compounds, Pravastatin, and Zoloft [sertraline hcl]  Medications: Prior to Admission medications   Medication Sig Start Date End Date Taking? Authorizing Provider  acetaminophen (TYLENOL) 650 MG CR tablet Take 650 mg by mouth every 8 (eight) hours as needed for fever or pain.   Yes [provider]  CALCIUM-VITAMIN D PO Take 1 tablet by mouth daily.   Yes [provider]  cetirizine (ZYRTEC) 10 MG tablet Take 0.5 tablets (5 mg total) by mouth daily. Patient taking differently: Take 10 mg by mouth daily. 09/09/20  Yes Martinique, Betty G, MD  diltiazem (CARDIZEM) 30 MG tablet Take 1 tablet (30 mg total) by mouth every 6 (six) hours. 05/20/21  Yes Maczis, Barth Kirks, PA-C  docusate sodium (COLACE) 100 MG capsule Take 1 capsule (100 mg total) by mouth 2 (two) times daily as needed for mild constipation. 05/20/21  Yes Maczis, Barth Kirks, PA-C  fluticasone (FLONASE) 50 MCG/ACT nasal spray Place 1 spray into both nostrils 2 (two) times daily. Patient taking differently: Place 1 spray into both nostrils 2 (two) times daily as needed for allergies. 11/05/19  Yes Martinique, Betty G, MD  hydrocortisone cream 1 % Apply 1 application topically 4 (four) times daily as needed for itching.   Yes [provider]  LORazepam (ATIVAN) 0.5 MG tablet Take 0.5 mg by mouth 2 (two) times daily as needed for anxiety. 04/30/21  Yes [provider]  metoprolol tartrate (LOPRESSOR) 50 MG tablet Take 1 tablet (50 mg total) by mouth 2 (two) times daily. 05/20/21  Yes Maczis, Barth Kirks, PA-C  oxyCODONE (OXY IR/ROXICODONE) 5 MG immediate release tablet Take 1 tablet (5 mg total) by mouth every 6 (six) hours as needed for breakthrough pain. 05/20/21  Yes Ileana Roup, MD  pantoprazole (PROTONIX) 20 MG tablet Take 20 mg by mouth daily. 05/10/21  Yes [provider]  polyvinyl alcohol (LIQUIFILM TEARS) 1.4 % ophthalmic solution Place 1 drop into both eyes as needed for dry eyes.   Yes [provider]  VITAMIN D PO Take 1 capsule by mouth daily.   Yes [provider]  acetaminophen (TYLENOL) 500 MG tablet Take 2 tablets (1,000 mg total) by mouth every 8 (eight) hours. Patient not taking: Reported on 05/25/2021 05/20/21   Jillyn Ledger, PA-C  LORazepam (ATIVAN) 0.5 MG tablet Take 0.5-1 tablets (0.25-0.5 mg total) by mouth 2 (two) times daily as needed. for anxiety Patient not taking: Reported on 05/25/2021 03/30/21   Martinique, Betty G, MD  losartan (COZAAR) 100 MG tablet TAKE 1 TABLET BY MOUTH EVERY DAY Patient not taking: Reported on 05/25/2021 03/22/21   Martin[place], Betty G, MD  pantoprazole (PROTONIX) 20 MG tablet TAKE 1 TABLET BY MOUTH 30 MINUTES BEFORE BREAKFAST. STOP OMEPRAZOLE Patient not taking: Reported on 05/25/2021 05/10/21   Martinique, Betty G, MD  polyethylene glycol (MIRALAX / GLYCOLAX) 17 g packet Take 17 g  by mouth daily as needed. Patient not taking: Reported on 05/25/2021 05/20/21   Jillyn Ledger, PA-C     Vital Signs: BP (!) 151/74   Pulse 100   Temp 98.3 F (36.8 C) (Oral)   Resp 18   Ht 5\' 8"  (1.727 m)   Wt 132 lb 0.9 oz (59.9 kg)   SpO2 96%   BMI 20.08 kg/m   Physical Exam Vitals and nursing note reviewed.  Constitutional:      General: She is not  in acute distress. Pulmonary:     Effort: Pulmonary effort is normal. No respiratory distress.     Comments: Sating 100% on 4L [place]. Right chest tube site without tenderness, erythema, ative bleeding, or drainage; approximately 1250 cc serosanguinous fluid in pleure-vac; tube to water seal with (-) air leak. Skin:    General: Skin is warm and dry.  Neurological:     Mental Status: She is alert and oriented to person, place, and time.     Imaging: DG Chest Port 1 View  Result Date: 05/30/2021 CLINICAL DATA:  Chest tube EXAM: PORTABLE CHEST 1 VIEW COMPARISON:  05/29/2021 FINDINGS: Small bore RIGHT chest tube in place. RIGHT apical pneumothorax not appreciated on today's exam. Mild atelectasis and effusion at the RIGHT lung base. Multiple posterior RIGHT rib fractures noted. LEFT lung clear. IMPRESSION: 1. Chest tube in place on the RIGHT with no pneumothorax appreciated. 2. Multiple posterior RIGHT rib fractures and RIGHT basilar atelectasis. Electronically Signed   By: Suzy Bouchard M.D.   On: 05/30/2021 07:38   DG Chest Port 1 View  Result Date: 05/29/2021 CLINICAL DATA:  Shortness of breath EXAM: PORTABLE CHEST 1 VIEW COMPARISON:  May 29, 2021 study obtained earlier in the day. FINDINGS: Chest tube again noted on the right with small right apical pneumothorax, stable. There are multiple displaced rib fractures on the right. There is ill-defined airspace opacity in the right lower lung region which likely represents an area of contusion with potential associated loculated pleural fluid. No new opacity evident on either side. Left lung clear. Heart upper normal in size with pulmonary vascularity normal. No adenopathy. There is aortic atherosclerosis. IMPRESSION: Stable chest tube placement on the right with small right apical pneumothorax, stable. No tension component. Opacity right base laterally likely represents combination of loculated fluid and contusion. Note multiple displaced rib fractures  on the right. No new opacity evident. Stable cardiac silhouette. Aortic Atherosclerosis (ICD10-I70.0). Electronically Signed   By: Lowella Grip III M.D.   On: 05/29/2021 11:50   DG Chest Port 1 View  Result Date: 05/29/2021 CLINICAL DATA:  Short of breath. RIGHT chest tube placement. Multiple RIGHT rib fractures. EXAM: PORTABLE CHEST 1 VIEW COMPARISON:  CT 05/25/2021, chest radiograph 63 22 FINDINGS: Stable cardiac silhouette. Small bore RIGHT chest tube in place. Decrease in RIGHT pleural fluid volume. Small RIGHT apical pneumothorax remains unchanged in volume measuring approximately 6 mm from the apical chest wall. Multiple posterior RIGHT rib fractures again noted. IMPRESSION: 1. RIGHT chest tube in place with small apical pneumothorax. No change in pneumothorax volume. 2. Decrease in RIGHT pleural fluid. 3. Multiple posterior RIGHT rib fractures. Electronically Signed   By: Suzy Bouchard M.D.   On: 05/29/2021 09:17   DG Chest Port 1 View  Result Date: 05/28/2021 CLINICAL DATA:  Chest tube EXAM: PORTABLE CHEST 1 VIEW COMPARISON:  05/27/2021 FINDINGS: Right basilar chest tube is present. Similar residual right apical pneumothorax. Similar small right pleural effusion and right basilar  atelectasis/consolidation. Stable cardiomediastinal contours. IMPRESSION: Similar residual right apical pneumothorax with basilar chest tube present. Similar small right pleural effusion and right basilar atelectasis/consolidation. Electronically Signed   By: Macy Mis M.D.   On: 05/28/2021 08:09   DG CHEST PORT 1 VIEW  Result Date: 05/27/2021 CLINICAL DATA:  Pneumothorax. EXAM: PORTABLE CHEST 1 VIEW COMPARISON:  May 25, 2021. FINDINGS: Stable cardiomediastinal silhouette. Interval placement of pigtail drainage catheter in the right lung base. Right pleural effusion is significantly smaller compared to prior exam. Grossly stable small right apical pneumothorax is noted. Bony thorax is unremarkable. IMPRESSION:  Interval placement of pigtail drainage catheter in right lung base. Right pleural effusion is significantly smaller. Grossly stable small right apical pneumothorax. Aortic Atherosclerosis (ICD10-I70.0). Electronically Signed   By: Marijo Conception M.D.   On: 05/27/2021 08:59   CT [facility] PLEURAL DRAIN W/INDWELL CATH W/IMG GUIDE  Result Date: 05/26/2021 INDICATION: 80 year old female with posttraumatic right hydropneumothorax. She has multiple severely displaced rib fractures and is not a candidate for percutaneous thoracentesis. She presents for chest tube placement. EXAM: Placement of chest tube with CT guidance MEDICATIONS: None ANESTHESIA/SEDATION: Fentanyl 37.5 mcg IV; Versed 0.5 mg IV Moderate Sedation Time:  11 minutes The patient was continuously monitored during the procedure by the interventional radiology nurse under my direct supervision. COMPLICATIONS: None immediate. PROCEDURE: Informed written consent was obtained from the patient after a thorough discussion of the procedural risks, benefits and alternatives. All questions were addressed. Maximal Sterile Barrier Technique was utilized including caps, mask, sterile gowns, sterile gloves, sterile drape, hand hygiene and skin antiseptic. A timeout was performed prior to the initiation of the procedure. An axial CT scan was performed. The large right-sided hydropneumothorax was successfully identified. A suitable entry site over a rib along the right lateral chest wall inferior to the breast was identified. The skin entry site was marked and then the skin was sterilely prepped and draped in the standard fashion using chlorhexidine skin prep. Local anesthesia was attained by infiltration with 1% lidocaine. A small dermatotomy was made. Using trocar technique, a Cook 12 [place] all-purpose drainage catheter was advanced over the rib and positioned in the pleural fluid. The catheter was connected to low wall suction via a Pleur Evac. The catheter was secured to  the skin with 0 Prolene suture. Sterile bandages were applied. Post placement CT scanning demonstrates a well-positioned chest tube in the dependent aspect of the pleural effusion. IMPRESSION: Successful placement of 12 French chest tube with CT guidance. Electronically Signed   By: Jacqulynn Cadet M.D.   On: 05/26/2021 11:11    Labs:  CBC: Recent Labs    05/27/21 0154 05/28/21 0347 05/29/21 0241 05/30/21 0305  WBC 8.2 8.0 8.1 6.7  HGB 9.7* 10.2* 10.1* 9.7*  HCT 31.1* 32.1* 32.5* 31.6*  PLT 376 339 332 272    COAGS: Recent Labs    05/11/21 0035  INR 1.0    BMP: Recent Labs    09/09/20 1222 03/30/21 1301 05/18/21 0553 05/25/21 1356 05/26/21 0330 05/30/21 0305  NA 136   < > 135 134* 134* 143  K 4.0   < > 4.5 4.6 4.7 4.2  CL 99   < > 93* 94* 90* 99  CO2 32   < > 35* 33* 34* 36*  GLUCOSE 88   < > 106* 111* 122* 113*  BUN 18   < > 19 19 19  24*  CALCIUM 9.9   < > 9.0 9.5 9.5 9.6  CREATININE 0.87   < > 0.77 0.72 0.66 0.60  GFRNONAA 62   < > >60 >60 >60 >60  GFRAA 72  --   --   --   --   --    < > = values in this interval not displayed.    LIVER FUNCTION TESTS: Recent Labs    03/30/21 1301 05/11/21 0035 05/25/21 1358 05/26/21 0330  BILITOT 0.5 0.5 0.6 0.4  AST 18 25 26 20   ALT 10 13 14 19   ALKPHOS 52 54 53 52  PROT 7.3 6.7 6.5 6.4*  ALBUMIN 4.4 3.6 3.2* 3.1*    Assessment and Plan:   Post-traumatic right hydropneumothorax s/p right chest tube placement in IR 05/26/2021. Right chest tube stable with approximately 1250 cc serosanguinous fluid in pleure-vac, tube to water seal with (-) air leak. CXR this AM without PTX, however still with high output from tube. Discussed case with Dr. Kathlene Cote who recommends tube to remain until output <100 cc/day. Continue chest tube management- tube to water seal, continue with Qshift monitor of output, F/U CXR ordered for tomorrow AM. Further plans per [name]- appreciate and agree with management. IR to  follow.   Electronically Signed: Earley Abide, PA-C 05/30/2021, 10:02 AM   I spent a total of 15 Minutes at the the patient's bedside AND on the patient's [name] floor or unit, greater than 50% of which was counseling/coordinating care for right hydropneumothorax s/p right chest tube placement.

## 2021-05-30 NOTE — Progress Notes (Signed)
Patient's daughter Mauri Brooklyn, called to see how her mother was doing.  Patient's daughter states that she is very concerned about her mother and feels as if she needs rehab before she comes home.  Patient's daughter states that she or her siblings have medical experience and does not feel comfortable with the patient going home at the time of discharge.  She states " I don't want to see my mother suffer this way, and my mother doesn't want to suffer this way". Daughter would like to speak with the doctor and case manager about her concerns in person.  Daughter's phone number is [phone].

## 2021-05-30 NOTE — Progress Notes (Signed)
HOSPITAL MEDICINE OVERNIGHT EVENT NOTE    Notified by nursing that patient was exhibiting brisk serous drainage from around the chest tube site.  Nursing reports patient's respiratory status has not changed.  Oxygen requirements have not increased.  Chest x-ray reviewed from earlier today, no evidence of pneumothorax.    I asked nursing to additionally notify trauma as they are primarily managing the tube.  Continue to montior closely,   Melanie Emerald  MD [facility]

## 2021-05-30 NOTE — Progress Notes (Signed)
  Speech Language Pathology Treatment: Dysphagia  Patient Details Name: Melanie [name] MRN: [id number] DOB: 1941/05/18 Today's Date: 05/30/2021 Time: 1100-1115 SLP Time Calculation (min) (ACUTE ONLY): 15 min  Assessment / Plan / Recommendation Clinical Impression  Pt continues to demonstrate prolonged mastication with regular solids. Although it does appear to be adequate and without oral residue, also note that she has selected only pureed foods on her meal tray. When asked, pt says that some of the foods are hard for her. Coughing was only noted when trying to offer a liquid wash to try to facilitate oral transit. Recommend softening foods for ease of oral preparation which may also facilitate intake. Since she is gravitating all the way to pureed foods but is capable of mastication, will start with Dys 2 (chopped) foods, which may offer her at least a little more texture still.    HPI HPI: Melanie [name] is a 80 y.o. female with medical history significant of COPD, multinodular goiter, essential hypertension, anxiety disorder, overactive bladder, A. fib with RVR, recent traumatic hemopneumothorax presented in the ED with bilateral leg swelling.  Patient was recently admitted to the [facility] trauma service from 05/11/21- 05/20/21 for right-sided rib fractures with hemopneumothorax following fall from a bed. Per RN pt with coughing during PO consumption and difficulty with PO meds.      SLP Plan  Continue with current plan of care       Recommendations  Diet recommendations: Dysphagia 2 (fine chop);Thin liquid Liquids provided via: Cup;Straw Medication Administration: Whole meds with puree Supervision: Staff to assist with self feeding Compensations: Minimize environmental distractions;Slow rate;Small sips/bites Postural Changes and/or Swallow Maneuvers: Seated upright 90 degrees                Oral Care Recommendations: Oral care BID Follow up Recommendations: 24 hour  supervision/assistance SLP Visit Diagnosis: Dysphagia, unspecified (R13.10) Plan: Continue with current plan of care       GO                Osie Bond., M.A. [facility] Pager [phone] Office [phone]  05/30/2021, 11:44 AM

## 2021-05-30 NOTE — Progress Notes (Signed)
PROGRESS NOTE    Melanie [name]  HMC:[id number] DOB: 1941-03-07 DOA: 05/25/2021 PCP: Martinique, Betty G, MD    Chief Complaint  Patient presents with  . Leg Swelling    Brief Narrative:   Melanie [name] is a 80 y.o. female with medical history significant of COPD, multinodular goiter, essential hypertension, anxiety disorder, overactive bladder, A. fib with RVR, recent traumatic hemopneumothorax presented in the ED with bilateral leg swelling.   Patient was recently admitted to the [facility] trauma service from 05/11/21- 05/20/21 for right-sided rib fractures with hemopneumothorax following fall from a bed.  During hospitalization patient was intubated and then extubated, She had a right pigtail drain placed and removed and underwent IR thoracocentesis for right pleural effusion.  During that admission Patient has also developed A. fib with RVR which was evaluated by cardiology Dr. Einar Gip and placed on metoprolol and Cardizem with recommendation for outpatient follow-up.  She also required supplemental oxygen during that admission and was discharged home on 3 L of oxygen. Daughter in law reports intermittent shortness of breath with exertion, and progressive increase in leg swelling after she is discharged from hospital.  Her oxygen requirement has increased to 4 L today. CT chest: No definite evidence of pulmonary embolus. Moderate right hydropneumothorax is noted with loculated effusionseen in the right lower lobe and associated atelectasis of the right lower lobe. Stable appearance of right segmental rib fractures and flail chest is noted as described on prior exam. She underwent chest tube placement by IR.  Her breathing has improved she is more alert and participating in physical therapy. In view of her deconditioning palliative care consulted , recommended outpatient palliative follow up on discharge.   Earlier this afternoon, pt had a coughing spell and required NRB for a few minutes an  later on transitioned to [place] oxygen.      Assessment & Plan:   Principal Problem:   Atrial fibrillation with RVR (HCC) Active Problems:   Primary hypertension   Hypercholesteremia   Multinodular goiter   Overactive bladder   GERD (gastroesophageal reflux disease)   Peripheral neuropathy due to chemotherapy (HCC)   Anxiety disorder   Closed fracture of multiple ribs with flail chest   Hydropneumothorax   Malnutrition of moderate degree   Atrial fibrillation with RVR Rate not well controlled.  she initially required IV Cardizem, transitioned to oral Cardizem 30 mg QID, Increase the dose to 60 mg QID. And continue with  metoprolol. Heart rate still between 100-1 20. Transitioned to eliquis.  Echocardiogram revealed preserved left ventricular ejection fraction. Transition to oral cardizem 120 mg daily and metoprolol 50 mg BID.    Newly diagnosed right leg DVT Start the patient on IV heparin, transitioned to eliquis.    Right-sided hydropneumothorax with loculated effusions IR consulted.  Patient underwent chest tube placement for further evacuation of the effusion. Trauma surgery on board and appreciate recommendations.  Interval placement of pigtail drainage catheter in right lung base.   CXR showed small apical pneumothorax. The chest tube was put under water seal. Repeat CXR is stable.  She had a coughing spell and required NRB for a few min and  Later on transitioned to [place] oxygen.  Continue with nasal cannula oxygen  At 4 lit /min to keep sats greater than 90% and monitor output. Removal of the chest tube when the drinage is less than 100 cc/24 hours.  Pain control   Essential hypertension Well controlled.     GERD Continue with  Protonix   Anxiety Continue with Ativan.    Normocytic anemia Baseline hemoglobin is around 12, dropped to 10.  transfuse to keep hemoglobin greater than 7.  Continue to monitor    DVT prophylaxis: Heparin Code Status:  Full  code Family Communication: none at bedside.  Disposition:   Status is: Inpatient  Remains inpatient appropriate because:Ongoing diagnostic testing needed not appropriate for outpatient work up and Unsafe d/c plan   Dispo: The patient is from: Home              Anticipated d/c is to: Home              Patient currently is not medically stable to d/c.   Difficult to place patient No       Consultants:   IR  Surgery    Procedures: chest tube placement.  Antimicrobials:none.   Subjective: She looks very tired, no chest pain or sob.   Objective: Vitals:   05/29/21 2019 05/30/21 0358 05/30/21 0926 05/30/21 1154  BP: (!) 143/66 (!) 177/71 (!) 151/74 121/65  Pulse: 95 (!) 105 100   Resp: 18 18    Temp: 98 F (36.7 C) 98.3 F (36.8 C)    TempSrc: Oral Oral    SpO2: 100% 96%    Weight:      Height:        Intake/Output Summary (Last 24 hours) at 05/30/2021 1442 Last data filed at 05/30/2021 0540 Gross per 24 hour  Intake 363.4 ml  Output 170 ml  Net 193.4 ml   Filed Weights   05/25/21 1745 05/26/21 1200  Weight: 61.2 kg 59.9 kg    Examination:  General exam :Cachetic looking lady, appears very tired.  Respiratory system: diminished air entry at bases, on 4 lit of [place] oxygen. No wheezing heard.  Cardiovascular system: S1S2, irregularly irregular, tachycardic, no jvd,  Gastrointestinal system: Abdomen is soft, NT ND BS+ Central nervous system: alert and answering questions appropriately.  Extremities: No pedal edema Skin: No rashes seen psychiatry: Appropriate    Data Reviewed: I have personally reviewed following labs and imaging studies  CBC: Recent Labs  Lab 05/25/21 1356 05/26/21 0330 05/27/21 0154 05/28/21 0347 05/29/21 0241 05/30/21 0305  WBC 8.8 11.3* 8.2 8.0 8.1 6.7  NEUTROABS 7.5  --   --   --   --   --   HGB 11.1* 10.9* 9.7* 10.2* 10.1* 9.7*  HCT 34.5* 33.9* 31.1* 32.1* 32.5* 31.6*  MCV 95.0 94.4 96.0 96.4 97.6 96.9  PLT 403* 453* 376  339 332 761    Basic Metabolic Panel: Recent Labs  Lab 05/25/21 1356 05/26/21 0330 05/30/21 0305  NA 134* 134* 143  K 4.6 4.7 4.2  CL 94* 90* 99  CO2 33* 34* 36*  GLUCOSE 111* 122* 113*  BUN 19 19 24*  CREATININE 0.72 0.66 0.60  CALCIUM 9.5 9.5 9.6  MG  --  2.0  --   PHOS  --  3.9  --     GFR: Estimated Creatinine Clearance: 52.2 mL/min (by C-G formula based on SCr of 0.6 mg/dL).  Liver Function Tests: Recent Labs  Lab 05/25/21 1358 05/26/21 0330  AST 26 20  ALT 14 19  ALKPHOS 53 52  BILITOT 0.6 0.4  PROT 6.5 6.4*  ALBUMIN 3.2* 3.1*    CBG: No results for input(s): GLUCAP in the last 168 hours.   Recent Results (from the past 240 hour(s))  Resp Panel by RT-PCR (Flu A&B, Covid) Nasopharyngeal  Swab     Status: None   Collection Time: 05/25/21  1:21 PM   Specimen: Nasopharyngeal Swab; Nasopharyngeal(NP) swabs in vial transport medium  Result Value Ref Range Status   SARS Coronavirus 2 by RT PCR NEGATIVE NEGATIVE Final    Comment: (NOTE) SARS-CoV-2 target nucleic acids are NOT DETECTED.  The SARS-CoV-2 RNA is generally detectable in upper respiratory specimens during the acute phase of infection. The lowest concentration of SARS-CoV-2 viral copies this assay can detect is 138 copies/mL. A negative result does not preclude SARS-Cov-2 infection and should not be used as the sole basis for treatment or other patient management decisions. A negative result may occur with  improper specimen collection/handling, submission of specimen other than nasopharyngeal swab, presence of viral mutation(s) within the areas targeted by this assay, and inadequate number of viral copies(<138 copies/mL). A negative result must be combined with clinical observations, patient history, and epidemiological information. The expected result is Negative.  Fact Sheet for Patients:  EntrepreneurPulse.com.au  Fact Sheet for Healthcare Providers:   IncredibleEmployment.be  This test is no t yet approved or cleared by the [place] FDA and  has been authorized for detection and/or diagnosis of SARS-CoV-2 by FDA under an Emergency Use Authorization (EUA). This EUA will remain  in effect (meaning this test can be used) for the duration of the COVID-19 declaration under Section 564(b)(1) of the Act, 21 U.S.C.section 360bbb-3(b)(1), unless the authorization is terminated  or revoked sooner.       Influenza A by PCR NEGATIVE NEGATIVE Final   Influenza B by PCR NEGATIVE NEGATIVE Final    Comment: (NOTE) The Xpert Xpress SARS-CoV-2/FLU/RSV plus assay is intended as an aid in the diagnosis of influenza from Nasopharyngeal swab specimens and should not be used as a sole basis for treatment. Nasal washings and aspirates are unacceptable for Xpert Xpress SARS-CoV-2/FLU/RSV testing.  Fact Sheet for Patients: EntrepreneurPulse.com.au  Fact Sheet for Healthcare Providers: IncredibleEmployment.be  This test is not yet approved or cleared by the [place] FDA and has been authorized for detection and/or diagnosis of SARS-CoV-2 by FDA under an Emergency Use Authorization (EUA). This EUA will remain in effect (meaning this test can be used) for the duration of the COVID-19 declaration under Section 564(b)(1) of the Act, 21 U.S.C. section 360bbb-3(b)(1), unless the authorization is terminated or revoked.  Performed at [facility], [place] [address] [place], [place] [address]          Radiology Studies: DG Chest Port 1 View  Result Date: 05/30/2021 CLINICAL DATA:  Chest tube EXAM: PORTABLE CHEST 1 VIEW COMPARISON:  05/29/2021 FINDINGS: Small bore RIGHT chest tube in place. RIGHT apical pneumothorax not appreciated on today's exam. Mild atelectasis and effusion at the RIGHT lung base. Multiple posterior RIGHT rib fractures noted. LEFT lung clear.  IMPRESSION: 1. Chest tube in place on the RIGHT with no pneumothorax appreciated. 2. Multiple posterior RIGHT rib fractures and RIGHT basilar atelectasis. Electronically Signed   By: Suzy Bouchard M.D.   On: 05/30/2021 07:38   DG Chest Port 1 View  Result Date: 05/29/2021 CLINICAL DATA:  Shortness of breath EXAM: PORTABLE CHEST 1 VIEW COMPARISON:  May 29, 2021 study obtained earlier in the day. FINDINGS: Chest tube again noted on the right with small right apical pneumothorax, stable. There are multiple displaced rib fractures on the right. There is ill-defined airspace opacity in the right lower lung region which likely represents an area of contusion with potential associated loculated pleural  fluid. No new opacity evident on either side. Left lung clear. Heart upper normal in size with pulmonary vascularity normal. No adenopathy. There is aortic atherosclerosis. IMPRESSION: Stable chest tube placement on the right with small right apical pneumothorax, stable. No tension component. Opacity right base laterally likely represents combination of loculated fluid and contusion. Note multiple displaced rib fractures on the right. No new opacity evident. Stable cardiac silhouette. Aortic Atherosclerosis (ICD10-I70.0). Electronically Signed   By: Lowella Grip III M.D.   On: 05/29/2021 11:50   DG Chest Port 1 View  Result Date: 05/29/2021 CLINICAL DATA:  Short of breath. RIGHT chest tube placement. Multiple RIGHT rib fractures. EXAM: PORTABLE CHEST 1 VIEW COMPARISON:  CT 05/25/2021, chest radiograph 63 22 FINDINGS: Stable cardiac silhouette. Small bore RIGHT chest tube in place. Decrease in RIGHT pleural fluid volume. Small RIGHT apical pneumothorax remains unchanged in volume measuring approximately 6 mm from the apical chest wall. Multiple posterior RIGHT rib fractures again noted. IMPRESSION: 1. RIGHT chest tube in place with small apical pneumothorax. No change in pneumothorax volume. 2. Decrease in RIGHT  pleural fluid. 3. Multiple posterior RIGHT rib fractures. Electronically Signed   By: Suzy Bouchard M.D.   On: 05/29/2021 09:17        Scheduled Meds: . acetaminophen  1,000 mg Oral Q8H  . apixaban  10 mg Oral BID   Followed by  . [START ON 06/06/2021] apixaban  5 mg Oral BID  . diltiazem  60 mg Oral Q6H  . feeding supplement  237 mL Oral TID BM  . loratadine  10 mg Oral Daily  . losartan  100 mg Oral Daily  . mouth rinse  15 mL Mouth Rinse BID  . metoprolol tartrate  50 mg Oral BID  . oxymetazoline  1 spray Each Nare BID  . pantoprazole  40 mg Oral Daily   Continuous Infusions:    LOS: 5 days      Hosie Poisson, MD [facility]   To contact the attending provider between 7A-7P or the covering provider during after hours 7P-7A, please log into the web site [email or web address] and access using universal [place] password for that web site. If you do not have the password, please call the hospital operator.  05/30/2021, 2:42 PM

## 2021-05-31 ENCOUNTER — Inpatient Hospital Stay (HOSPITAL_COMMUNITY): Payer: Medicare Other

## 2021-05-31 ENCOUNTER — Encounter (HOSPITAL_COMMUNITY): Payer: Self-pay | Admitting: Family Medicine

## 2021-05-31 MED ORDER — LORAZEPAM 0.5 MG PO TABS
0.5000 mg | ORAL_TABLET | Freq: Four times a day (QID) | ORAL | Status: DC | PRN
  Administered 2021-05-31 – 2021-06-06 (×14): 0.5 mg via ORAL
  Filled 2021-05-31 (×14): qty 1

## 2021-05-31 NOTE — Care Management Important Message (Signed)
Important Message  Patient Details  Name: Melanie [name] MRN: [id number] Date of Birth: [birth year]   Medicare Important Message Given:  Yes     [name] 05/31/2021, 11:05 AM

## 2021-05-31 NOTE — Progress Notes (Addendum)
Referring Physician(s): Shawna Clamp [facility])  Supervising Physician: Corrie Mckusick  Patient Status:  [facility] - In-pt  Chief Complaint:  Post-traumatic right hydropneumothorax s/p right chest tube placement in IR 05/26/2021.  Brief History:  Melanie [name] is a 80 y.o. female with history of fall resulting in right rib fractures anda  hemopneumothorax.   A Chest tube was placed by the trauma team on 5/17 with subsequent removal.  She developed a recurrent right pleural effusion, had a thoracentesis on 5/23.  She was admitted on 5/31 with worsening SOB.  CT chest showed right hydropneumthorax.  She underwent placement of a pigtail chest catheter by Dr. Laurence Ferrari on 05/26/21.  Chest Xray done yesterday showed= Chest tube in place on the RIGHT with no pneumothorax appreciated  Yesterday her chest tube was placed to water seal.  Today, chest Xray showed = Mild right apical pneumothorax is now noted.  Nurse also reported leakage around the tube insertion site.   Subjective:  Sitting up in chair. No new complaints.  Allergies: Latex, Macrodantin, Guaifenesin er, Morphine and related, Ditropan [oxybutynin], Lipitor [atorvastatin], Lisinopril, Magnesium-containing compounds, Pravastatin, and Zoloft [sertraline hcl]  Medications: Prior to Admission medications   Medication Sig Start Date End Date Taking? Authorizing Provider  acetaminophen (TYLENOL) 650 MG CR tablet Take 650 mg by mouth every 8 (eight) hours as needed for fever or pain.   Yes [provider]  CALCIUM-VITAMIN D PO Take 1 tablet by mouth daily.   Yes [provider]  cetirizine (ZYRTEC) 10 MG tablet Take 0.5 tablets (5 mg total) by mouth daily. Patient taking differently: Take 10 mg by mouth daily. 09/09/20  Yes Martin[place], Betty G, MD  diltiazem (CARDIZEM) 30 MG tablet Take 1 tablet (30 mg total) by mouth every 6 (six) hours. 05/20/21  Yes Maczis, Barth Kirks, PA-C  docusate sodium (COLACE) 100 MG  capsule Take 1 capsule (100 mg total) by mouth 2 (two) times daily as needed for mild constipation. 05/20/21  Yes Maczis, Barth Kirks, PA-C  fluticasone (FLONASE) 50 MCG/ACT nasal spray Place 1 spray into both nostrils 2 (two) times daily. Patient taking differently: Place 1 spray into both nostrils 2 (two) times daily as needed for allergies. 11/05/19  Yes Martin[place], Betty G, MD  hydrocortisone cream 1 % Apply 1 application topically 4 (four) times daily as needed for itching.   Yes [provider]  LORazepam (ATIVAN) 0.5 MG tablet Take 0.5 mg by mouth 2 (two) times daily as needed for anxiety. 04/30/21  Yes [provider]  metoprolol tartrate (LOPRESSOR) 50 MG tablet Take 1 tablet (50 mg total) by mouth 2 (two) times daily. 05/20/21  Yes Maczis, Barth Kirks, PA-C  oxyCODONE (OXY IR/ROXICODONE) 5 MG immediate release tablet Take 1 tablet (5 mg total) by mouth every 6 (six) hours as needed for breakthrough pain. 05/20/21  Yes Ileana Roup, MD  pantoprazole (PROTONIX) 20 MG tablet Take 20 mg by mouth daily. 05/10/21  Yes [provider]  polyvinyl alcohol (LIQUIFILM TEARS) 1.4 % ophthalmic solution Place 1 drop into both eyes as needed for dry eyes.   Yes [provider]  VITAMIN D PO Take 1 capsule by mouth daily.   Yes [provider]  acetaminophen (TYLENOL) 500 MG tablet Take 2 tablets (1,000 mg total) by mouth every 8 (eight) hours. Patient not taking: Reported on 05/25/2021 05/20/21   Jillyn Ledger, PA-C  LORazepam (ATIVAN) 0.5 MG tablet Take 0.5-1 tablets (0.25-0.5 mg total) by mouth  2 (two) times daily as needed. for anxiety Patient not taking: Reported on 05/25/2021 03/30/21   Martinique, Betty G, MD  losartan (COZAAR) 100 MG tablet TAKE 1 TABLET BY MOUTH EVERY DAY Patient not taking: Reported on 05/25/2021 03/22/21   Martin[place], Betty G, MD  pantoprazole (PROTONIX) 20 MG tablet TAKE 1 TABLET BY MOUTH 30 MINUTES BEFORE BREAKFAST. STOP OMEPRAZOLE Patient not  taking: Reported on 05/25/2021 05/10/21   Martin[place], Betty G, MD  polyethylene glycol (MIRALAX / GLYCOLAX) 17 g packet Take 17 g by mouth daily as needed. Patient not taking: Reported on 05/25/2021 05/20/21   Jillyn Ledger, PA-C     Vital Signs: BP (!) 113/54 (BP Location: Right Arm)   Pulse 93   Temp (!) 97.5 F (36.4 C) (Oral)   Resp 18   Ht 5\' 8"  (1.727 m)   Wt 59.9 kg   SpO2 98%   BMI 20.08 kg/m   Physical Exam Vitals reviewed.  Constitutional:      Appearance: Normal appearance.  Cardiovascular:     Rate and Rhythm: Normal rate.  Pulmonary:     Effort: Pulmonary effort is normal. No respiratory distress.     Comments: Right 12 fr. Pigtail in place. Exit site examined. Suture remains in place and pigtail appears to be secure and in proper position. No SQ emphysema. There is evidence of leakage with soaked ABD pads overlying the tube. No air leak. ~160 mL output recorded (not counting soaked ABD pads) Putt back to LWS at 40. Abdominal:     Palpations: Abdomen is soft.  Neurological:     General: No focal deficit present.     Mental Status: She is alert and oriented to person, place, and time.  Psychiatric:        Mood and Affect: Mood normal.        Behavior: Behavior normal.        Thought Content: Thought content normal.        Judgment: Judgment normal.     Imaging: DG Chest Port 1 View  Result Date: 05/31/2021 CLINICAL DATA:  Chest tube in place. EXAM: PORTABLE CHEST 1 VIEW COMPARISON:  May 30, 2021. FINDINGS: The heart size and mediastinal contours are within normal limits. Right-sided chest tube is again noted. Mild right apical pneumothorax is noted. Mild bibasilar subsegmental atelectasis is noted. Multiple displaced right rib fractures are again noted. IMPRESSION: Right-sided chest tube is again noted. Mild right apical pneumothorax is now noted. Multiple displaced right rib fractures are again noted. Aortic Atherosclerosis (ICD10-I70.0). Electronically Signed    By: Marijo Conception M.D.   On: 05/31/2021 08:21   DG Chest Port 1 View  Result Date: 05/30/2021 CLINICAL DATA:  Chest tube EXAM: PORTABLE CHEST 1 VIEW COMPARISON:  05/29/2021 FINDINGS: Small bore RIGHT chest tube in place. RIGHT apical pneumothorax not appreciated on today's exam. Mild atelectasis and effusion at the RIGHT lung base. Multiple posterior RIGHT rib fractures noted. LEFT lung clear. IMPRESSION: 1. Chest tube in place on the RIGHT with no pneumothorax appreciated. 2. Multiple posterior RIGHT rib fractures and RIGHT basilar atelectasis. Electronically Signed   By: Suzy Bouchard M.D.   On: 05/30/2021 07:38   DG Chest Port 1 View  Result Date: 05/29/2021 CLINICAL DATA:  Shortness of breath EXAM: PORTABLE CHEST 1 VIEW COMPARISON:  May 29, 2021 study obtained earlier in the day. FINDINGS: Chest tube again noted on the right with small right apical pneumothorax, stable. There are multiple displaced rib fractures  on the right. There is ill-defined airspace opacity in the right lower lung region which likely represents an area of contusion with potential associated loculated pleural fluid. No new opacity evident on either side. Left lung clear. Heart upper normal in size with pulmonary vascularity normal. No adenopathy. There is aortic atherosclerosis. IMPRESSION: Stable chest tube placement on the right with small right apical pneumothorax, stable. No tension component. Opacity right base laterally likely represents combination of loculated fluid and contusion. Note multiple displaced rib fractures on the right. No new opacity evident. Stable cardiac silhouette. Aortic Atherosclerosis (ICD10-I70.0). Electronically Signed   By: Lowella Grip III M.D.   On: 05/29/2021 11:50   DG Chest Port 1 View  Result Date: 05/29/2021 CLINICAL DATA:  Short of breath. RIGHT chest tube placement. Multiple RIGHT rib fractures. EXAM: PORTABLE CHEST 1 VIEW COMPARISON:  CT 05/25/2021, chest radiograph 63 22 FINDINGS:  Stable cardiac silhouette. Small bore RIGHT chest tube in place. Decrease in RIGHT pleural fluid volume. Small RIGHT apical pneumothorax remains unchanged in volume measuring approximately 6 mm from the apical chest wall. Multiple posterior RIGHT rib fractures again noted. IMPRESSION: 1. RIGHT chest tube in place with small apical pneumothorax. No change in pneumothorax volume. 2. Decrease in RIGHT pleural fluid. 3. Multiple posterior RIGHT rib fractures. Electronically Signed   By: Suzy Bouchard M.D.   On: 05/29/2021 09:17   DG Chest Port 1 View  Result Date: 05/28/2021 CLINICAL DATA:  Chest tube EXAM: PORTABLE CHEST 1 VIEW COMPARISON:  05/27/2021 FINDINGS: Right basilar chest tube is present. Similar residual right apical pneumothorax. Similar small right pleural effusion and right basilar atelectasis/consolidation. Stable cardiomediastinal contours. IMPRESSION: Similar residual right apical pneumothorax with basilar chest tube present. Similar small right pleural effusion and right basilar atelectasis/consolidation. Electronically Signed   By: Macy Mis M.D.   On: 05/28/2021 08:09    Labs:  CBC: Recent Labs    05/27/21 0154 05/28/21 0347 05/29/21 0241 05/30/21 0305  WBC 8.2 8.0 8.1 6.7  HGB 9.7* 10.2* 10.1* 9.7*  HCT 31.1* 32.1* 32.5* 31.6*  PLT 376 339 332 272    COAGS: Recent Labs    05/11/21 0035  INR 1.0    BMP: Recent Labs    09/09/20 1222 03/30/21 1301 05/18/21 0553 05/25/21 1356 05/26/21 0330 05/30/21 0305  NA 136   < > 135 134* 134* 143  K 4.0   < > 4.5 4.6 4.7 4.2  CL 99   < > 93* 94* 90* 99  CO2 32   < > 35* 33* 34* 36*  GLUCOSE 88   < > 106* 111* 122* 113*  BUN 18   < > 19 19 19  24*  CALCIUM 9.9   < > 9.0 9.5 9.5 9.6  CREATININE 0.87   < > 0.77 0.72 0.66 0.60  GFRNONAA 62   < > >60 >60 >60 >60  GFRAA 72  --   --   --   --   --    < > = values in this interval not displayed.    LIVER FUNCTION TESTS: Recent Labs    03/30/21 1301 05/11/21 0035  05/25/21 1358 05/26/21 0330  BILITOT 0.5 0.5 0.6 0.4  AST 18 25 26 20   ALT 10 13 14 19   ALKPHOS 52 54 53 52  PROT 7.3 6.7 6.5 6.4*  ALBUMIN 4.4 3.6 3.2* 3.1*    Assessment and Plan:  Recurrent hydropneumothorax with chest tube to water seal.  Chest tube put back  to suction.  Discussed with Dr. Earleen Newport.  Given recurrent pneumothorax, will ask CT surgery to evaluate patient.  Discussed with Jadene Pierini via secure chat and TCTS team will evaluate patient.  *Addendum* Discussed with Dr. Kipp Brood. He recommends additional apical chest tube placement. Will plan for tomorrow. Will hold Eliquis.  Electronically Signed: Murrell Redden, PA-C 05/31/2021, 2:17 PM    I spent a total of 15 Minutes at the the patient's bedside AND on the patient's hospital floor or unit, greater than 50% of which was counseling/coordinating care for follow up chest tube.

## 2021-05-31 NOTE — Progress Notes (Signed)
PROGRESS NOTE    [name]  MOQ:[id number] DOB: Aug 07, 1941 DOA: 05/25/2021 PCP: [place], Betty G, MD    Chief Complaint  Patient presents with  . Leg Swelling    Brief Narrative:   Melanie [name] is a 80 y.o. female with medical history significant of COPD, multinodular goiter, essential hypertension, anxiety disorder, overactive bladder, A. fib with RVR, recent traumatic hemopneumothorax presented in the ED with bilateral leg swelling.   Patient was recently admitted to the [facility] trauma service from 05/11/21- 05/20/21 for right-sided rib fractures with hemopneumothorax following fall from a bed.  During hospitalization patient was intubated and then extubated, She had a right pigtail drain placed and removed and underwent IR thoracocentesis for right pleural effusion.  During that admission Patient has also developed A. fib with RVR which was evaluated by cardiology Dr. Einar Gip and placed on metoprolol and Cardizem with recommendation for outpatient follow-up.  She also required supplemental oxygen during that admission and was discharged home on 3 L of oxygen.  Her oxygen requirement has increased to 4 L/min. CT chest shows No definite evidence of pulmonary embolus. Moderate right hydropneumothorax is noted with loculated effusionseen in the right lower lobe and associated atelectasis of the right lower lobe. Stable appearance of right segmental rib fractures and flail chest is noted  She underwent chest tube placement by IR on 05/26/21. Repeat CXR showed new mild right pneumothorax. She has recurrent hydropneumothorax with chest to water seal, hence its put back to suction. IR consulted CT surgery given recurrent pneumothorax, recommended additional apical chest tube placement in am.  In view of her deconditioning palliative care consulted , recommended outpatient palliative follow up on discharge.        Assessment & Plan:   Principal Problem:   Atrial fibrillation with RVR  (HCC) Active Problems:   Primary hypertension   Hypercholesteremia   Multinodular goiter   Overactive bladder   GERD (gastroesophageal reflux disease)   Peripheral neuropathy due to chemotherapy (HCC)   Anxiety disorder   Closed fracture of multiple ribs with flail chest   Hydropneumothorax   Malnutrition of moderate degree   Atrial fibrillation with RVR Rate between  she initially required IV Cardizem, transitioned to oral Cardizem 30 mg QID, Increase the dose to 60 mg QID for better rate control.  Heart rate still between 100-120. Transitioned to eliquis for anticoagulation held for pig tail catheter placement in am.  Echocardiogram revealed preserved left ventricular ejection fraction.   Newly diagnosed right leg DVT Start the patient on IV heparin, transitioned to eliquis.    Right-sided hydropneumothorax with loculated effusions IR consulted.  Patient underwent Interval placement of pigtail drainage catheter in right lung base.  for further evacuation of the effusion. Trauma surgery on board and signed off. She was improving and the chest tube was put on water seal,   But unfortunately she developed recurrent pneumothorax.  Her Chest tube was put back on suction and CT surgery consulted by IR for recurrent pneumothorax.  Dr Sunday Corn foot recommended putting another apical pig tail catheter, scheduled for tomorrow.    Essential hypertension Well controlled.     GERD Continue with Protonix   Anxiety Continue with Ativan.    Normocytic anemia Baseline hemoglobin is around 12, dropped to 10.  transfuse to keep hemoglobin greater than 7.  Continue to monitor    DVT prophylaxis: Heparin Code Status:  Full code Family Communication: none at bedside.  Disposition:   Status is: Inpatient  Remains inpatient appropriate because:Ongoing diagnostic testing needed not appropriate for outpatient work up and Unsafe d/c plan   Dispo: The patient is from: Home               Anticipated d/c is to: Home              Patient currently is not medically stable to d/c.   Difficult to place patient No       Consultants:   IR  Surgery    Procedures: chest tube placement.   Antimicrobials:none.   Subjective: No new complaints. She appears very tired.   Objective: Vitals:   05/30/21 1833 05/30/21 2026 05/31/21 0412 05/31/21 0952  BP: (!) 147/77 131/64 (!) 124/55 (!) 153/59  Pulse:  91 70 98  Resp:  19 17   Temp:  98.9 F (37.2 C) 98.5 F (36.9 C)   TempSrc:  Oral Oral   SpO2:  98% 98%   Weight:      Height:        Intake/Output Summary (Last 24 hours) at 05/31/2021 1133 Last data filed at 05/31/2021 0600 Gross per 24 hour  Intake --  Output 260 ml  Net -260 ml   Filed Weights   05/25/21 1745 05/26/21 1200  Weight: 61.2 kg 59.9 kg    Examination:  General exam :Cachetic looking lady, on 4lit/min of oxygen, not in distress.  Respiratory system: diminished air entry at bases, no wheezing heard.  Cardiovascular system: S1S2,Irregularly irregular, no JVD, no pedal edema.  Gastrointestinal system: Abdomen is soft, non tender non distended bowel sounds wnl.  Central nervous system: alert and oriented, non focal Extremities: no pedal edema.  Skin: No rashes seen  psychiatry:Mood is appropriate.     Data Reviewed: I have personally reviewed following labs and imaging studies  CBC: Recent Labs  Lab 05/25/21 1356 05/26/21 0330 05/27/21 0154 05/28/21 0347 05/29/21 0241 05/30/21 0305  WBC 8.8 11.3* 8.2 8.0 8.1 6.7  NEUTROABS 7.5  --   --   --   --   --   HGB 11.1* 10.9* 9.7* 10.2* 10.1* 9.7*  HCT 34.5* 33.9* 31.1* 32.1* 32.5* 31.6*  MCV 95.0 94.4 96.0 96.4 97.6 96.9  PLT 403* 453* 376 339 332 314    Basic Metabolic Panel: Recent Labs  Lab 05/25/21 1356 05/26/21 0330 05/30/21 0305  NA 134* 134* 143  K 4.6 4.7 4.2  CL 94* 90* 99  CO2 33* 34* 36*  GLUCOSE 111* 122* 113*  BUN 19 19 24*  CREATININE 0.72 0.66 0.60   CALCIUM 9.5 9.5 9.6  MG  --  2.0  --   PHOS  --  3.9  --     GFR: Estimated Creatinine Clearance: 52.2 mL/min (by C-G formula based on SCr of 0.6 mg/dL).  Liver Function Tests: Recent Labs  Lab 05/25/21 1358 05/26/21 0330  AST 26 20  ALT 14 19  ALKPHOS 53 52  BILITOT 0.6 0.4  PROT 6.5 6.4*  ALBUMIN 3.2* 3.1*    CBG: No results for input(s): GLUCAP in the last 168 hours.   Recent Results (from the past 240 hour(s))  Resp Panel by RT-PCR (Flu A&B, Covid) Nasopharyngeal Swab     Status: None   Collection Time: 05/25/21  1:21 PM   Specimen: Nasopharyngeal Swab; Nasopharyngeal(NP) swabs in vial transport medium  Result Value Ref Range Status   SARS Coronavirus 2 by RT PCR NEGATIVE NEGATIVE Final    Comment: (NOTE) SARS-CoV-2 target nucleic acids  are NOT DETECTED.  The SARS-CoV-2 RNA is generally detectable in upper respiratory specimens during the acute phase of infection. The lowest concentration of SARS-CoV-2 viral copies this assay can detect is 138 copies/mL. A negative result does not preclude SARS-Cov-2 infection and should not be used as the sole basis for treatment or other patient management decisions. A negative result may occur with  improper specimen collection/handling, submission of specimen other than nasopharyngeal swab, presence of viral mutation(s) within the areas targeted by this assay, and inadequate number of viral copies(<138 copies/mL). A negative result must be combined with clinical observations, patient history, and epidemiological information. The expected result is Negative.  Fact Sheet for Patients:  EntrepreneurPulse.com.au  Fact Sheet for Healthcare Providers:  IncredibleEmployment.be  This test is no t yet approved or cleared by the [place] FDA and  has been authorized for detection and/or diagnosis of SARS-CoV-2 by FDA under an Emergency Use Authorization (EUA). This EUA will remain  in  effect (meaning this test can be used) for the duration of the COVID-19 declaration under Section 564(b)(1) of the Act, 21 U.S.C.section 360bbb-3(b)(1), unless the authorization is terminated  or revoked sooner.       Influenza A by PCR NEGATIVE NEGATIVE Final   Influenza B by PCR NEGATIVE NEGATIVE Final    Comment: (NOTE) The Xpert Xpress SARS-CoV-2/FLU/RSV plus assay is intended as an aid in the diagnosis of influenza from Nasopharyngeal swab specimens and should not be used as a sole basis for treatment. Nasal washings and aspirates are unacceptable for Xpert Xpress SARS-CoV-2/FLU/RSV testing.  Fact Sheet for Patients: EntrepreneurPulse.com.au  Fact Sheet for Healthcare Providers: IncredibleEmployment.be  This test is not yet approved or cleared by the [place] FDA and has been authorized for detection and/or diagnosis of SARS-CoV-2 by FDA under an Emergency Use Authorization (EUA). This EUA will remain in effect (meaning this test can be used) for the duration of the COVID-19 declaration under Section 564(b)(1) of the Act, 21 U.S.C. section 360bbb-3(b)(1), unless the authorization is terminated or revoked.  Performed at [facility], [place] [address] [place], [place] [address]          Radiology Studies: [facility] Chest Port 1 View  Result Date: 05/31/2021 CLINICAL DATA:  Chest tube in place. EXAM: PORTABLE CHEST 1 VIEW COMPARISON:  May 30, 2021. FINDINGS: The heart size and mediastinal contours are within normal limits. Right-sided chest tube is again noted. Mild right apical pneumothorax is noted. Mild bibasilar subsegmental atelectasis is noted. Multiple displaced right rib fractures are again noted. IMPRESSION: Right-sided chest tube is again noted. Mild right apical pneumothorax is now noted. Multiple displaced right rib fractures are again noted. Aortic Atherosclerosis (ICD10-I70.0). Electronically Signed   By:  Marijo Conception M.D.   On: 05/31/2021 08:21   DG Chest Port 1 View  Result Date: 05/30/2021 CLINICAL DATA:  Chest tube EXAM: PORTABLE CHEST 1 VIEW COMPARISON:  05/29/2021 FINDINGS: Small bore RIGHT chest tube in place. RIGHT apical pneumothorax not appreciated on today's exam. Mild atelectasis and effusion at the RIGHT lung base. Multiple posterior RIGHT rib fractures noted. LEFT lung clear. IMPRESSION: 1. Chest tube in place on the RIGHT with no pneumothorax appreciated. 2. Multiple posterior RIGHT rib fractures and RIGHT basilar atelectasis. Electronically Signed   By: Suzy Bouchard M.D.   On: 05/30/2021 07:38   DG Chest Port 1 View  Result Date: 05/29/2021 CLINICAL DATA:  Shortness of breath EXAM: PORTABLE CHEST 1 VIEW COMPARISON:  May 29, 2021 study obtained earlier in the day. FINDINGS: Chest tube again noted on the right with small right apical pneumothorax, stable. There are multiple displaced rib fractures on the right. There is ill-defined airspace opacity in the right lower lung region which likely represents an area of contusion with potential associated loculated pleural fluid. No new opacity evident on either side. Left lung clear. Heart upper normal in size with pulmonary vascularity normal. No adenopathy. There is aortic atherosclerosis. IMPRESSION: Stable chest tube placement on the right with small right apical pneumothorax, stable. No tension component. Opacity right base laterally likely represents combination of loculated fluid and contusion. Note multiple displaced rib fractures on the right. No new opacity evident. Stable cardiac silhouette. Aortic Atherosclerosis (ICD10-I70.0). Electronically Signed   By: Lowella Grip III M.D.   On: 05/29/2021 11:50        Scheduled Meds: . acetaminophen  1,000 mg Oral Q8H  . apixaban  10 mg Oral BID   Followed by  . [START ON 06/06/2021] apixaban  5 mg Oral BID  . diltiazem  60 mg Oral Q6H  . feeding supplement  237 mL Oral TID BM   . loratadine  10 mg Oral Daily  . losartan  100 mg Oral Daily  . mouth rinse  15 mL Mouth Rinse BID  . metoprolol tartrate  50 mg Oral BID  . oxymetazoline  1 spray Each Nare BID  . pantoprazole  40 mg Oral Daily   Continuous Infusions:    LOS: 6 days      Hosie Poisson, MD [facility]   To contact the attending provider between 7A-7P or the covering provider during after hours 7P-7A, please log into the web site [email or web address] and access using universal [place] password for that web site. If you do not have the password, please call the hospital operator.  05/31/2021, 11:33 AM

## 2021-05-31 NOTE — Progress Notes (Addendum)
This chaplain responded to PMT consult for creating or updating the Pt. Advance Directive.  The chaplain introduced herself and the purpose of an Advance Directive to the Pt.   The Pt. shared her choice for healthcare decision maker is her Milinda Antis.  The chaplain understands after reading the chart notes the Pt. previously designated her son-"Randy to make decisions in her honor."   This chaplain will reach out to PMT-MYF before proceeding with documenting HCPOA.  [place] [phone]  [phone] This chaplain phoned the Pt. Milinda Antis. The chaplain understands [name] accepts the role as healthcare agent for the Pt.  The chaplain returned to the Pt. bedside and reaffirmed with the Pt. her choice of her grandson-Joe Mason as [place].   The chaplain completed the documentation with the Pt.  A notary and witnesses are not available today.  The chaplain will F/U on Tuesday.  The Pt. document was placed in the Pt. chart.  The chaplain understands from the Pt., this HCPOA will supercede any previous documents.

## 2021-05-31 NOTE — Progress Notes (Addendum)
Physical Therapy Treatment Patient Details Name: ADELYNE MARCHESE MRN: [id number] DOB: 12/12/1941 Today's Date: 05/31/2021    History of Present Illness Pt is an 80 y.o. female who recently was admitted to [facility] from 05/11/2021-05/20/2021 with bilateral leg swelling following R-sided rib fractures with traumatic hemopneumothorax. During that admission pt developed a fib with RVR. Daughter reports that pt experiences intermitten SOB with exertion and progressive increase in leg swelling following discharge from hospital, re-admitted on 05/25/2021. Moderate right hydropneumothorax on CT with loculated effusion in the right lower lobe and associated atelectasis of the right lower lobe. Stable appearance of right segmental rib fractures and flail chest. PMH significant for COPD, multinodular goiter, essential hypertension, anxiety disorder, overactive bladder, A. fib with RVR.    PT Comments    Patient received in bed, frail looking. Has chest tube on wall suction. Patient is agreeable to PT session. She performed bed exercises with cues. Fatigued with these, requiring rests and only able to perform 5 reps of each. Patient is mod independent for bed mobility but also required seated rest periods. Patient assisted to [facility] with min assist. Ambulated 5 feet from [facility] to recliner with min assist. Patient limited by weakness. She will continue to benefit from skilled PT while here to improve strength and functional independence.       Follow Up Recommendations  SNF- if family able to provide 24 hour assist she may be able to return home with HHPT      Equipment Recommendations  Other (comment) (TBD)    Recommendations for Other Services       Precautions / Restrictions Precautions Precautions: Fall Precaution Comments: chest tube to wall suction Restrictions Weight Bearing Restrictions: No    Mobility  Bed Mobility Overal bed mobility: Modified Independent Bed Mobility: Supine to Sit                 Transfers Overall transfer level: Needs assistance Equipment used: None Transfers: Sit to/from [facility] Sit to Stand: Min assist Stand pivot transfers: Min assist       General transfer comment: Patient pivoted to [facility] from bed. Requires cues to reach back and assist for balance.  Ambulation/Gait Ambulation/Gait assistance: Min assist Gait Distance (Feet): 5 Feet Assistive device: 1 person hand held assist Gait Pattern/deviations: Step-through pattern;Decreased stride length;Trunk flexed;Decreased step length - right;Decreased step length - left Gait velocity: decreased   General Gait Details: Patient very weak and fatigued. Able to take a few steps from bsc to recliner. Requires cues for lines, safety. Min assist for balance.   Stairs             Wheelchair Mobility    Modified Rankin (Stroke Patients Only)       Balance Overall balance assessment: Needs assistance Sitting-balance support: Feet supported Sitting balance-Leahy Scale: Good Sitting balance - Comments: Min guard for safety.   Standing balance support: During functional activity;Single extremity supported Standing balance-Leahy Scale: Fair Standing balance comment: Pt tolerates standing after using bedside commode for peri care.                            Cognition Arousal/Alertness: Lethargic Behavior During Therapy: WFL for tasks assessed/performed Overall Cognitive Status: No family/caregiver present to determine baseline cognitive functioning Area of Impairment: Safety/judgement;Awareness;Problem solving                     Memory: Decreased short-term memory Following Commands: Follows one  step commands consistently;Follows one step commands with increased time Safety/Judgement: Decreased awareness of safety;Decreased awareness of deficits Awareness: Intellectual Problem Solving: Requires verbal cues;Slow processing        Exercises Other  Exercises Other Exercises: Supine LE exercises: AP, heel slides, SLR, hip abd/add x 5 reps each. Fatigued with these.    General Comments        Pertinent Vitals/Pain Pain Assessment: No/denies pain    Home Living                      Prior Function            PT Goals (current goals can now be found in the care plan section) Acute Rehab PT Goals Patient Stated Goal: Get better and go home PT Goal Formulation: With patient Time For Goal Achievement: 06/11/21 Potential to Achieve Goals: Fair Progress towards PT goals: Progressing toward goals    Frequency           PT Plan Discharge plan needs to be updated    Co-evaluation              AM-PAC PT "6 Clicks" Mobility   Outcome Measure  Help needed turning from your back to your side while in a flat bed without using bedrails?: A Little Help needed moving from lying on your back to sitting on the side of a flat bed without using bedrails?: A Little Help needed moving to and from a bed to a chair (including a wheelchair)?: A Little Help needed standing up from a chair using your arms (e.g., wheelchair or bedside chair)?: A Little Help needed to walk in hospital room?: A Lot Help needed climbing 3-5 steps with a railing? : A Lot 6 Click Score: 16    End of Session Equipment Utilized During Treatment: Gait belt;Oxygen Activity Tolerance: Patient limited by fatigue Patient left: in chair;with call bell/phone within reach;with chair alarm set Nurse Communication: Mobility status PT Visit Diagnosis: Other abnormalities of gait and mobility (R26.89);Muscle weakness (generalized) (M62.81);History of falling (Z91.81);Difficulty in walking, not elsewhere classified (R26.2)     Time: 0920-0950 PT Time Calculation (min) (ACUTE ONLY): 30 min  Charges:  $Gait Training: 8-22 mins $Therapeutic Exercise: 8-22 mins                     Yusef Lamp, PT, GCS 05/31/21,10:31 AM

## 2021-05-31 NOTE — TOC Initial Note (Signed)
Transition of Care [facility]) - Initial/Assessment Note    Patient Details  Name: Melanie [name] MRN: [id number] Date of Birth: Jul 07, 1941  Transition of Care [facility]) CM/SW Contact:    Trula Ore, [place] Phone Number: 05/31/2021, 4:08 PM  Clinical Narrative:         CSW received consult for possible SNF placement at time of discharge. CSW spoke with patient at beside regarding PT recommendation of SNF placement at time of discharge. Patient comes from home alone. Patient expressed understanding of PT recommendation and is agreeable to SNF placement at time of discharge. Patient gave CSW permission to fax out initial referral near [place] and [place] area. Patient has received the COVID vaccines as well as booster. Patient gave CSW permission to discuss her care with her son Louie Casa. CSW updated patients son on patients dc plan. No further questions reported at this time. CSW to continue to follow and assist with discharge planning needs.            Expected Discharge Plan: Skilled Nursing Facility Barriers to Discharge: Continued Medical Work up   Patient Goals and CMS Choice Patient states their goals for this hospitalization and ongoing recovery are:: to go to SNF CMS Medicare.gov Compare Post Acute Care list provided to:: Patient Choice offered to / list presented to : Patient  Expected Discharge Plan and Services Expected Discharge Plan: [place] In-house Referral: Clinical Social Work     Living arrangements for the past 2 months: Single Family Home                                      Prior Living Arrangements/Services Living arrangements for the past 2 months: Single Family Home Lives with:: Self Patient language and need for interpreter reviewed:: Yes Do you feel safe going back to the place where you live?: No   SNF  Need for Family Participation in Patient Care: Yes (Comment) Care giver support system in place?: Yes (comment)   Criminal  Activity/Legal Involvement Pertinent to Current Situation/Hospitalization: No - Comment as needed  Activities of Daily Living Home Assistive Devices/Equipment: Eyeglasses,Hospital bed,Walker (specify type),Oxygen ADL Screening (condition at time of admission) Patient's cognitive ability adequate to safely complete daily activities?: Yes Is the patient deaf or have difficulty hearing?: Yes Does the patient have difficulty seeing, even when wearing glasses/contacts?: No Does the patient have difficulty concentrating, remembering, or making decisions?: No Patient able to express need for assistance with ADLs?: Yes Does the patient have difficulty dressing or bathing?: Yes Independently performs ADLs?: No Communication: Independent Dressing (OT): Needs assistance Is this a change from baseline?: Pre-admission baseline Grooming: Needs assistance Is this a change from baseline?: Pre-admission baseline Feeding: Independent Bathing: Needs assistance Is this a change from baseline?: Pre-admission baseline Toileting: Needs assistance Is this a change from baseline?: Pre-admission baseline In/Out Bed: Needs assistance Is this a change from baseline?: Pre-admission baseline Walks in Home: Needs assistance Is this a change from baseline?: Pre-admission baseline Does the patient have difficulty walking or climbing stairs?: Yes Weakness of Legs: Both Weakness of Arms/Hands: Both  Permission Sought/Granted Permission sought to share information with : Case Manager,Facility Contact Representative,Family Supports Permission granted to share information with : Yes, Verbal Permission Granted  Share Information with NAME: Louie Casa  Permission granted to share info w AGENCY: SNF  Permission granted to share info w Relationship: son  Permission granted to share info  w Contact Information: Louie Casa [phone]  Emotional Assessment Appearance:: Appears stated age Attitude/Demeanor/Rapport:  Gracious Affect (typically observed): Calm Orientation: : Oriented to Self,Oriented to Place,Oriented to  Time,Oriented to Situation Alcohol / Substance Use: Not Applicable Psych Involvement: No (comment)  Admission diagnosis:  Hydropneumothorax [J94.8] Atrial fibrillation with RVR ([place]) [I48.91] Patient Active Problem List   Diagnosis Date Noted  . Malnutrition of moderate degree 05/27/2021  . Hydropneumothorax   . Atrial fibrillation with RVR ([place]) 05/25/2021  . Closed fracture of multiple ribs with flail chest 05/11/2021  . Atherosclerosis of aorta ([place]) 03/30/2021  . Heart murmur 03/30/2021  . SOB (shortness of breath) 03/30/2021  . COPD (chronic obstructive pulmonary disease) ([place]) 09/09/2020  . Adjustment disorder, unspecified 01/11/2019  . Allergic rhinitis 06/18/2018  . Chronic fatigue 01/16/2018  . Peripheral neuropathy due to chemotherapy ([place]) 08/03/2016  . Anxiety disorder 08/03/2016  . Lumbar back pain with radiculopathy affecting right lower extremity 08/03/2016  . Primary hypertension 04/14/2016  . Hypercholesteremia 04/14/2016  . Multinodular goiter 04/14/2016  . Overactive bladder 04/14/2016  . GERD (gastroesophageal reflux disease) 04/14/2016   PCP:  [place], Betty G, MD Pharmacy:   [id number] [address], [place] [facility] AT [place] [address] [place] [address] Phone: [phone] Fax: [phone]  [facility] [place] [place] [address] [place] [address] [place] [address] Phone: [phone] Fax: [phone]     Social Determinants of Health (SDOH) Interventions    Readmission Risk Interventions Readmission Risk Prevention Plan 05/20/2021 05/20/2021  Post Dischage Appt Complete Complete  Medication Screening Complete Complete  Transportation Screening Complete Complete  Some recent data might be hidden

## 2021-06-01 ENCOUNTER — Inpatient Hospital Stay: Payer: Medicare Other | Admitting: Family Medicine

## 2021-06-01 ENCOUNTER — Inpatient Hospital Stay (HOSPITAL_COMMUNITY): Payer: Medicare Other

## 2021-06-01 DIAGNOSIS — Z515 Encounter for palliative care: Secondary | ICD-10-CM

## 2021-06-01 DIAGNOSIS — Z7189 Other specified counseling: Secondary | ICD-10-CM

## 2021-06-01 MED ORDER — OXYCODONE HCL 5 MG PO TABS: 2.5000 mg | ORAL_TABLET | Freq: Four times a day (QID) | ORAL | Status: DC | PRN

## 2021-06-01 MED ORDER — FENTANYL CITRATE (PF) 100 MCG/2ML IJ SOLN
INTRAMUSCULAR | Status: AC
  Filled 2021-06-01: qty 2

## 2021-06-01 MED ORDER — MIDAZOLAM HCL 2 MG/2ML IJ SOLN
INTRAMUSCULAR | Status: AC
  Filled 2021-06-01: qty 2

## 2021-06-01 MED ORDER — LIDOCAINE HCL 1 % IJ SOLN
INTRAMUSCULAR | Status: AC
  Filled 2021-06-01: qty 20

## 2021-06-01 MED ORDER — FENTANYL CITRATE (PF) 100 MCG/2ML IJ SOLN
INTRAMUSCULAR | Status: AC | PRN
  Administered 2021-06-01: 25 ug via INTRAVENOUS

## 2021-06-01 NOTE — NC FL2 (Addendum)
[place] LEVEL OF CARE SCREENING TOOL     IDENTIFICATION  Patient Name: Melanie [name] Birthdate: 12-31-1940 Sex: female Admission Date (Current Location): 05/25/2021  [facility] and [place] Number:  Herbalist and Address:  [facility], [place] [address], [place], [place] [address]      Provider Number: [id number]  Attending Physician Name and Address:  [place] Eric J, DO  Relative Name and Phone Number:  Louie Casa [phone]    Current Level of Care: Hospital Recommended Level of Care: [place] Prior Approval Number:    Date Approved/Denied:   PASRR Number: [id number]  Discharge Plan: SNF    Current Diagnoses: Patient Active Problem List   Diagnosis Date Noted  . Malnutrition of moderate degree 05/27/2021  . Hydropneumothorax   . Atrial fibrillation with RVR ([place]) 05/25/2021  . Closed fracture of multiple ribs with flail chest 05/11/2021  . Atherosclerosis of aorta ([place]) 03/30/2021  . Heart murmur 03/30/2021  . SOB (shortness of breath) 03/30/2021  . COPD (chronic obstructive pulmonary disease) ([place]) 09/09/2020  . Adjustment disorder, unspecified 01/11/2019  . Allergic rhinitis 06/18/2018  . Chronic fatigue 01/16/2018  . Peripheral neuropathy due to chemotherapy ([place]) 08/03/2016  . Anxiety disorder 08/03/2016  . Lumbar back pain with radiculopathy affecting right lower extremity 08/03/2016  . Primary hypertension 04/14/2016  . Hypercholesteremia 04/14/2016  . Multinodular goiter 04/14/2016  . Overactive bladder 04/14/2016  . GERD (gastroesophageal reflux disease) 04/14/2016    Orientation RESPIRATION BLADDER Height & Weight     Self,Time,Situation,Place  O2 (4 liters nasal cannula) Continent,External catheter (External Urinary Catheter) Weight: 132 lb 0.9 oz (59.9 kg) Height:  5\' 8"  (172.7 cm)  BEHAVIORAL SYMPTOMS/MOOD NEUROLOGICAL BOWEL NUTRITION STATUS      Continent Diet (Please see discharge  summary)  AMBULATORY STATUS COMMUNICATION OF NEEDS Skin   Limited Assist Verbally Other (Comment) (Ecchymosis,back,arm,chest,R,L,catheter chest,R,gauze,skin turgor non-tenting,wound incision open or dehiced puncture chest,R,anterior,mid,lateral,chest tube insertion site,R,gauze,clean,dry,intact,PRN,dressing in place)                       Personal Care Assistance Level of Assistance  Bathing,Feeding,Dressing Bathing Assistance: Limited assistance Feeding assistance: Limited assistance (Needs assist. Need assist sitting up) Dressing Assistance: Limited assistance     Functional Limitations Info  Sight,Hearing,Speech   Hearing Info: Impaired Speech Info: Adequate    SPECIAL CARE FACTORS FREQUENCY  PT (By licensed PT),OT (By licensed OT)     PT Frequency: 5x min weekly OT Frequency: 5x min weekly            Contractures Contractures Info: Not present    Additional Factors Info  Code Status,Allergies Code Status Info: DNR Allergies Info: Latex,Macrodantin,Guaifenesin Er,Morphine And Related,Ditropan (oxybutynin),Lipitor (atorvastatin),Lisinopril,Magnesium-containing Compounds,Pravastatin,Zoloft (sertraline Hcl)           Current Medications (06/01/2021):  This is the current hospital active medication list Current Facility-Administered Medications  Medication Dose Route Frequency Provider Last Rate Last Admin  . acetaminophen (TYLENOL) tablet 1,000 mg  1,000 mg Oral Q8H Shawna Clamp, MD   1,000 mg at 06/01/21 1603  . apixaban (ELIQUIS) tablet 10 mg  10 mg Oral BID Kris Mouton, RPH   10 mg at 05/31/21 8469   Followed by  . [START ON 06/06/2021] apixaban (ELIQUIS) tablet 5 mg  5 mg Oral BID Kris Mouton, RPH      . diltiazem (CARDIZEM) tablet 60 mg  60 mg Oral Q6H Hosie Poisson, MD   60  mg at 06/01/21 1142  . docusate sodium (COLACE) capsule 100 mg  100 mg Oral BID PRN Shawna Clamp, MD      . feeding supplement (ENSURE ENLIVE / ENSURE PLUS) liquid 237 mL  237  mL Oral TID BM Hosie Poisson, MD   237 mL at 06/01/21 1603  . fentaNYL (SUBLIMAZE) 100 MCG/2ML injection           . lidocaine (XYLOCAINE) 1 % (with pres) injection           . loratadine (CLARITIN) tablet 10 mg  10 mg Oral Daily Hosie Poisson, MD   10 mg at 06/01/21 1139  . LORazepam (ATIVAN) tablet 0.5 mg  0.5 mg Oral Q6H PRN Hosie Poisson, MD   0.5 mg at 06/01/21 0810  . losartan (COZAAR) tablet 100 mg  100 mg Oral Daily Shawna Clamp, MD   100 mg at 06/01/21 1139  . MEDLINE mouth rinse  15 mL Mouth Rinse BID Hosie Poisson, MD   15 mL at 06/01/21 1151  . metoprolol tartrate (LOPRESSOR) tablet 50 mg  50 mg Oral BID Shawna Clamp, MD   50 mg at 06/01/21 1138  . ondansetron (ZOFRAN) tablet 4 mg  4 mg Oral Q6H PRN Shawna Clamp, MD       Or  . ondansetron [facility]) injection 4 mg  4 mg Intravenous Q6H PRN Shawna Clamp, MD      . oxyCODONE (Oxy IR/ROXICODONE) immediate release tablet 2.5 mg  2.5 mg Oral Q6H PRN Rosezella Rumpf, NP      . pantoprazole (PROTONIX) EC tablet 40 mg  40 mg Oral Daily Shawna Clamp, MD   40 mg at 06/01/21 1139  . sodium chloride (OCEAN) 0.65 % nasal spray 1 spray  1 spray Each Nare PRN Hosie Poisson, MD         Discharge Medications: Please see discharge summary for a list of discharge medications.  Relevant Imaging Results:  Relevant Lab Results:   Additional Information [id number]  Trula Ore, LCSWA

## 2021-06-01 NOTE — Plan of Care (Signed)

## 2021-06-01 NOTE — Progress Notes (Signed)
SLP Cancellation Note  Patient Details Name:  COHICK MRN: [id number] DOB: 1941-06-30   Cancelled treatment:       Reason Eval/Treat Not Completed: Patient at procedure or test/unavailable. Attempted to see pt this morning, but she was off the floor for procedure. This afternoon, she is in a family meeting.  Note that per palliative care, family did decide to liberalize diet to regular solids and thin liquids (already update by Dr. [place] in an effort to increase oral intake. This was recommended by SLP during initial evaluation, and modifications at follow up were also made to try to increase intake as at the time, pt was gravitating toward the pureed foods on her tray. Will continue to f/u briefly for tolerance.    Osie Bond., M.A. [place] [facility] Pager [phone] Office [phone]  06/01/2021, 3:54 PM

## 2021-06-01 NOTE — Progress Notes (Signed)
Palliative Medicine Inpatient Follow Up Note  Reason for consult:  Goals of Care  HPI:  Per intake H&P --> Melanie [name] a 80 y.o.femalewith medical history significant ofCOPD,multinodular goiter, essential hypertension, anxiety disorder, overactive bladder, A. fib with RVR,recent traumatic hemopneumothorax presented in the ED with bilateral leg swelling.   Patient was recently admitted to the [facility] trauma service from 05/11/21-5/26/22for right-sided rib fractures with hemopneumothorax following fall from a bed. During hospitalization patient was intubated and then extubated, Shehad a right pigtail drain placed and removed and underwent IR thoracocentesis for right pleural effusion. During that admission Patienthasalso developed A. fib with RVR which was evaluated by cardiology Dr. Einar [name] and placed on metoprolol and Cardizem with recommendation for outpatient follow-up. She also required supplemental oxygen during that admission and was discharged home on 3 L of oxygen.  Daughter in law reports intermittent shortness of breath with exertion,and progressive increase in leg swelling after she is discharged from hospital. Her oxygen requirement has increased to 4 L today. CT chest:No definite evidence of pulmonary embolus.Moderate right hydropneumothorax is noted with loculated effusionseen in the right lower lobe and associated atelectasis of the rightlower lobe.Stable appearance of right segmental rib fractures and flail chestis noted as described on prior exam.  Of care has been asked to get involved in the setting of multiple chronic comorbid conditions to further define goals of care.  Today's Discussion (06/01/2021):  *Please note that this is a verbal dictation therefore any spelling or grammatical errors are due to the "[place] [facility]" system interpretation.  Chart reviewed.  I met with Melanie [name] at bedside this morning with her day nurse, Melanie [name].  Per  conversation Melanie [name] is planned to have a chest tube exchange this morning.  She has per nursing been more lethargic had a lack of p.o. appetite.   On assessment Melanie [name] appears to be tachypneic and overall appears uncomfortable.  She denies any active pain or dyspnea.  I called patient's son Melanie [name] this morning for further conversations given Melanie [name]'s declining health state.  We reviewed her chronic comorbidities and how her recent fall has led to an acute decline in function.  Reviewed that Melanie [name]'s husband died at [facility] after being intubated for a week's time.  Melanie [name] and his sisters are all in agreement that DO NOT RESUSCITATE is appropriate for her.  Melanie [name] shares that he is concerned about Melanie [name]'s overall prognosis in the setting of her generalized weakness and inability to make improvements.  After prior hospitalization Melanie [name] had taken Melanie [name] to live with him though the care needs that she had for great and he was unable to accommodate them, this and her ongoing leg swelling leading to rehospitalization.  Over the past week Melanie [name] feels that Melanie [name] has further declined and he is concerned about her long-term outlook.  I broached the topic of hospice.  I described hospice as a service for patients for have a life expectancy of < 6 months. It preserves dignity and quality at the end phases of life. The focus changes from curative to symptom relief.  Melanie [name] shares that he and his sisters have been concerned about what the next steps would be as a do not think she will fare well in rehab.  We discussed that having a family meeting at this juncture would be of benefit to further refine what the goals are at this point in time.  Questions and concerns addressed   Objective Assessment: Vital Signs Vitals:   06/01/21 0011 06/01/21  0528  BP: (!) 117/55 (!) 148/65  Pulse: 83 95  Resp: 20 20  Temp: 97.6 F (36.4 C) (!) 97.4 F (36.3 C)  SpO2: 95% 99%    Intake/Output Summary (Last 24 hours) at  06/01/2021 0941 Last data filed at 06/01/2021 0600 Gross per 24 hour  Intake 300 ml  Output --  Net 300 ml   Last Weight  Most recent update: 05/26/2021 12:01 PM   Weight  59.9 kg (132 lb 0.9 oz)           Gen:  Elderly F in NAD HEENT: moist mucous membranes CV: Regular rate and rhythm, no murmurs rubs or gallops PULM: On 5LPM [place], R chest tube in place with serosanguinous fluid, draining onto thorax ABD: soft/nontender  EXT: No edema  Neuro: Alert and oriented x3  SUMMARY OF RECOMMENDATIONS DNAR/DNI  MOST Completed, paper copy placed onto the chart electric copy can be found in [facility]  DNR Form Completed, paper copy placed onto the chart electric copy can be found in [place] for IR to exchange chest tube today  Plan for family meeting tomorrow to further discuss patient's goals of care and introduce further with the topic of hospice care  I will not be present tomorrow though I will request one of my palliative care colleagues follow-up with Melanie [name]'s family  Time Spent: 45 Greater than 50% of the time was spent in counseling and coordination of care ______________________________________________________________________________________ [place] Team Team Cell Phone: [phone] Please utilize secure chat with additional questions, if there is no response within 30 minutes please call the above phone number  Palliative Medicine Team providers are available by phone from 7am to 7pm daily and can be reached through the team cell phone.  Should this patient require assistance outside of these hours, please call the patient's attending physician.

## 2021-06-01 NOTE — Progress Notes (Signed)
Palliative Medicine Inpatient Follow Up Note  Family Meeting (06/01/2021)  Participants: Dr. [place]), Walden Field, NP, Tacey Ruiz, NP  Family Present: Nelta Numbers (grandson), Abbott Pao (son), Samanthan Dugo (daughter), Nona Dell (daughter), Maizey Menendez (daughter)  Discussion: Meeting started at 3 PM.  Dr. [place] initiated conversations regarding patient's clinical state.  Dr. [place] provided a comprehensive medical update to to [facility] and her family at bedside reviewed her hemopneumothorax and the requirement of now to chest tubes being placed.  Discussed patient's atrial fibrillation.  Discussed patient's weakend physical state and poor nutritional state and what this means in the greater picture of things.   Melanie [name] herself shares that she hopeful for physical improvements and strengthening at rehabilitation.   After Dr. [place] completed his formal medical update I was able to speak with the patient and her family regarding best case worst-case scenarios.  I shared that the best case scenario is that Melanie [name] gets to rehabilitation and continues to gain strength ideally being able to mobilize and complete more tasks autonomously moving forward.  I shared that the worst case scenario is that she continues to deteriorate despite medical efforts continues to have failure to thrive.    We reviewed that if Jasmene does not improve it is propria to further consider hospice care.  Reviewed that hospice care would be initiated to preserve dignity and quality at the end of life and ideally provide optimal management of symptoms.  Reviewed that it is important that we delineate what her wishes would be in terms of where she would want care to take place.  Thorough review of hospice in the home versus hospice in a facility was held patient herself shared that if this does occur she would wish for her family to spend time with her in a hospice facility.  We reviewed the idea of a "miracle"  I shared with Melanie [name] that yes miracles are things that we always hope for when we are in an ill position though sometimes a miracle is to have a peaceful passing from this earth.  Patient and her family understood this.  Summarized at this point in time we will continue to work towards improving Melanie [name] clinical conditions.  Encouraged Melanie [name] to get out of bed.  Melanie [name]'s nutritional state has been liberalized so that she may be more willing to eat and drink.  She vocalized understanding of this. ___________________________________________________________  After leaving the room Melanie [name] and I met with two of Melanie [name]'s daughters, Melanie [name] and Melanie [name] who shared with [place] that Melanie [name] has been declining daily since her first hospitalization.  They shared that they worry she is "suffering" at this point in time.  We reviewed that at this juncture Kasey is in her right state of mind to continue to make decisions for herself and if her goals are for improvement we have to honor that.  We reviewed that sometimes patients have the opportunity to think more about what they would or would not want and opt for more comfort oriented path.  I shared with him that we will continue to be involved and offer support to both [place] and her family in the oncoming days.  SUMMARY OF RECOMMENDATIONS DNAR/DNI  MOST/DNR in [facility]  Patients goals are to get stronger at [facility]  Plan for OP Palliative Support on discharge  I will not be present tomorrow though I will request one of my palliative care colleagues follow-up with Melanie [name]'s family  Symptoms: Continue tylenol 1g Q8H, continue oxycodone at decreased dose  of 2.34m Q6H PRN   Time In: 1500 Time Out: 1610 Time Spent: 70 additional minutes Greater than 50% of the time was spent in counseling and coordination of care ______________________________________________________________________________________ [place] Team Cell  Phone: [phone] utilize secure chat with additional questions, if there is no response within 30 minutes please call the above phone number  Palliative Medicine Team providers are available by phone from 7am to 7pm daily and can be reached through the team cell phone.  Should this patient require assistance outside of these hours, please call the patient's attending physician.

## 2021-06-01 NOTE — Progress Notes (Signed)
RE: Melanie [name]. [place]  Date of Birth: Feb 26, 1941  Date: 06/01/2021  To Whom It May Concern:  Please be advised that the above-named patient will require a short-term nursing home stay - anticipated 30 days or less for rehabilitation and strengthening. The plan is for return home.

## 2021-06-01 NOTE — Progress Notes (Addendum)
PROGRESS NOTE    [name]  LGX:[id number] DOB: 02-23-1941 DOA: 05/25/2021 PCP: [place], Betty G, MD    Brief Narrative:  Melanie [name] is a 80 year old female with past medical history significant for COPD, multinodular goiter, essential hypertension, anxiety disorder, overactive bladder, paroxysmal atrial fibrillation, recent traumatic hemopneumothorax who presented to the ED with bilateral lower extremity edema.  Also her oxygen requirement has increased to 4 L/min with CT scan in the ED showing no evidence of pulmonary embolism but with moderate right hydropneumothorax with loculated effusion right lower lobe with associated atelectasis.  Patient was recently admitted to [facility] trauma service from 05/11/2021 through 05/20/2021 following fall out of bed with subsequent right-sided rib fractures with hemopneumothorax.  During that hospitalization, patient was intubated, underwent IR thoracentesis, right pigtail drain placement which was removed prior to her discharge.  Patient also developed A. fib with RVR and started on metoprolol and Cardizem per cardiology, Dr. Einar Gip with outpatient follow-up recommended.  Patient was discharged on 3 L nasal cannula.  Assessment & Plan:   Principal Problem:   Atrial fibrillation with RVR (HCC) Active Problems:   Primary hypertension   Hypercholesteremia   Multinodular goiter   Overactive bladder   GERD (gastroesophageal reflux disease)   Peripheral neuropathy due to chemotherapy (HCC)   Anxiety disorder   Closed fracture of multiple ribs with flail chest   Hydropneumothorax   Malnutrition of moderate degree   Atrial fibrillation with RVR TTE 05/18/2021 limited by pain due to flail chest and hydropneumothorax but with grossly preserved LVEF. --Cardizem 60 mg p.o. 4 times daily --Eliquis for anticoagulation --Continue monitor on telemetry  Right leg DVT, recently diagnosed --Eliquis   Right-sided hydropneumothorax with loculated  effusion, recurrent Multiple right-sided rib fractures During previous hospitalization, patient had pigtail catheter placed which was removed prior to discharge.  Patient's O2 requirements have increased since discharge and CT chest notable for moderate right hemopneumothorax with associated loculated effusion/atelectasis. --s/p chest tube 6/1 by IR --s/p right apical chest tube by IR 06/01/21 --Tylenol 1000 mg p.o. every 8 hours scheduled --Oxycodone 2.5 mg p.o. every 6 hours as needed breakthrough pain --Monitor chest tube output closely --Repeat chest x-ray in a.m.  Essential hypertension BP 148/65 this morning --Losartan 100 mg p.o. daily --Metoprolol tartrate 50 mg p.o. twice daily --Cardizem 60 mg p.o. every 6 hours --Continue monitor BP closely  GERD --Protonix 40 mg p.o. daily  Anxiety --Lorazepam 0.5 mg p.o. every 6 hours as needed anxiety  Moderate protein calorie malnutrition Body mass index is 20.08 kg/m. Nutrition Status: Nutrition Problem: Moderate Malnutrition Etiology: chronic illness (COPD) Signs/Symptoms: moderate fat depletion,mild muscle depletion,severe muscle depletion Interventions: Ensure Enlive (each supplement provides 350kcal and 20 grams of protein),MVI --Dietitian following --Liberalize diet to regular --Continue to encourage increased oral intake, supplement use  Adult failure to thrive Patient is very thin and cachectic in appearance.  Family report poor oral intake for some time.  Patient with moderate protein calorie malnutrition.  Patient with recent fall from bed sustaining multiple right-sided rib fractures with associated hemopneumothorax as above. --Palliative care following for goals of care and medical decision making.  Family meeting on 06/01/2021.     DVT prophylaxis: Place and maintain sequential compression device Start: 05/25/21 1725 apixaban (ELIQUIS) tablet 10 mg  apixaban (ELIQUIS) tablet 5 mg     Code Status: DNR Family  Communication: Updated multiple family members during family meeting today at bedside with palliative care.  Disposition Plan:  Level of  care: Progressive Status is: Inpatient  Remains inpatient appropriate because:Ongoing active pain requiring inpatient pain management, Ongoing diagnostic testing needed not appropriate for outpatient work up, Unsafe d/c plan, IV treatments appropriate due to intensity of illness or inability to take PO and Inpatient level of care appropriate due to severity of illness   Dispo: The patient is from: Home              Anticipated d/c is to: To be determined              Patient currently is not medically stable to d/c.   Difficult to place patient No   Consultants:   Interventional radiology  Palliative care  Procedures:   10 F right-sided chest tube, IR, Dr. Laurence Ferrari; 6/1  62F right apical chest tube, IR, Dr. Earleen Newport; 6/7  Antimicrobials:   none     Subjective: Patient seen examined bedside, resting comfortably.  Just returned back from IR with right apical chest tube placement.  Multiple family members.  Family meeting at bedside with palliative care.  Patient continues to be weak with poor oral intake.  Family requesting a different diet.  Family also reports that her oral intake for many months has been poor.  Patient with pain currently controlled.  Continues with significant shortness of breath and shallow respirations.  No other questions or concerns at this time.  Patient denies headache, no dizziness, no chest pain, no fever/chills/night sweats, no nausea/vomiting/diarrhea, no abdominal pain.  No acute concerns overnight per nursing staff.  Objective: Vitals:   06/01/21 1224 06/01/21 1253 06/01/21 1323 06/01/21 1353  BP: (!) 129/50 (!) 107/55 113/62 108/66  Pulse: 86 79 77 90  Resp:      Temp:      TempSrc:      SpO2: 98% 100% 96%   Weight:      Height:        Intake/Output Summary (Last 24 hours) at 06/01/2021 1619 Last data  filed at 06/01/2021 0600 Gross per 24 hour  Intake 300 ml  Output --  Net 300 ml   Filed Weights   05/25/21 1745 05/26/21 1200  Weight: 61.2 kg 59.9 kg    Examination:  General exam: Appears calm and comfortable, thin/cachectic/chronically ill in appearance Respiratory system: Breath sounds decreased right base, no wheezing/crackles, normal respiratory effort, on 5 L nasal cannula with SPO2 99% at rest.  Chest tubes noted to suction. Cardiovascular system: S1 & S2 heard, RRR. No JVD, murmurs, rubs, gallops or clicks. No pedal edema. Gastrointestinal system: Abdomen is nondistended, soft and nontender. No organomegaly or masses felt. Normal bowel sounds heard. Central nervous system: Alert and oriented. No focal neurological deficits. Extremities: Symmetric 5 x 5 power. Skin: No rashes, lesions or ulcers Psychiatry: Judgement and insight appear normal. Mood & affect appropriate.     Data Reviewed: I have personally reviewed following labs and imaging studies  CBC: Recent Labs  Lab 05/26/21 0330 05/27/21 0154 05/28/21 0347 05/29/21 0241 05/30/21 0305  WBC 11.3* 8.2 8.0 8.1 6.7  HGB 10.9* 9.7* 10.2* 10.1* 9.7*  HCT 33.9* 31.1* 32.1* 32.5* 31.6*  MCV 94.4 96.0 96.4 97.6 96.9  PLT 453* 376 339 332 419   Basic Metabolic Panel: Recent Labs  Lab 05/26/21 0330 05/30/21 0305  NA 134* 143  K 4.7 4.2  CL 90* 99  CO2 34* 36*  GLUCOSE 122* 113*  BUN 19 24*  CREATININE 0.66 0.60  CALCIUM 9.5 9.6  MG 2.0  --  PHOS 3.9  --    GFR: Estimated Creatinine Clearance: 52.2 mL/min (by C-[name] formula based on SCr of 0.6 mg/dL). Liver Function Tests: Recent Labs  Lab 05/26/21 0330  AST 20  ALT 19  ALKPHOS 52  BILITOT 0.4  PROT 6.4*  ALBUMIN 3.1*   No results for input(s): LIPASE, AMYLASE in the last 168 hours. No results for input(s): AMMONIA in the last 168 hours. Coagulation Profile: No results for input(s): INR, PROTIME in the last 168 hours. Cardiac Enzymes: No results  for input(s): CKTOTAL, CKMB, CKMBINDEX, TROPONINI in the last 168 hours. BNP (last 3 results) Recent Labs    03/30/21 1301  PROBNP 97.0   HbA1C: No results for input(s): HGBA1C in the last 72 hours. CBG: No results for input(s): GLUCAP in the last 168 hours. Lipid Profile: No results for input(s): CHOL, HDL, LDLCALC, TRIG, CHOLHDL, LDLDIRECT in the last 72 hours. Thyroid Function Tests: No results for input(s): TSH, T4TOTAL, FREET4, T3FREE, THYROIDAB in the last 72 hours. Anemia Panel: No results for input(s): VITAMINB12, FOLATE, FERRITIN, TIBC, IRON, RETICCTPCT in the last 72 hours. Sepsis Labs: No results for input(s): PROCALCITON, LATICACIDVEN in the last 168 hours.  Recent Results (from the past 240 hour(s))  Resp Panel by RT-PCR (Flu A&B, Covid) Nasopharyngeal Swab     Status: None   Collection Time: 05/25/21  1:21 PM   Specimen: Nasopharyngeal Swab; Nasopharyngeal(NP) swabs in vial transport medium  Result Value Ref Range Status   SARS Coronavirus 2 by RT PCR NEGATIVE NEGATIVE Final    Comment: (NOTE) SARS-CoV-2 target nucleic acids are NOT DETECTED.  The SARS-CoV-2 RNA is generally detectable in upper respiratory specimens during the acute phase of infection. The lowest concentration of SARS-CoV-2 viral copies this assay can detect is 138 copies/mL. A negative result does not preclude SARS-Cov-2 infection and should not be used as the sole basis for treatment or other patient management decisions. A negative result may occur with  improper specimen collection/handling, submission of specimen other than nasopharyngeal swab, presence of viral mutation(s) within the areas targeted by this assay, and inadequate number of viral copies(<138 copies/mL). A negative result must be combined with clinical observations, patient history, and epidemiological information. The expected result is Negative.  Fact Sheet for Patients:  EntrepreneurPulse.com.au  Fact  Sheet for Healthcare Providers:  IncredibleEmployment.be  This test is no t yet approved or cleared by the [place] FDA and  has been authorized for detection and/or diagnosis of SARS-CoV-2 by FDA under an Emergency Use Authorization (EUA). This EUA will remain  in effect (meaning this test can be used) for the duration of the COVID-19 declaration under Section 564(b)(1) of the Act, 21 U.S.C.section 360bbb-3(b)(1), unless the authorization is terminated  or revoked sooner.       Influenza A by PCR NEGATIVE NEGATIVE Final   Influenza B by PCR NEGATIVE NEGATIVE Final    Comment: (NOTE) The Xpert Xpress SARS-CoV-2/FLU/RSV plus assay is intended as an aid in the diagnosis of influenza from Nasopharyngeal swab specimens and should not be used as a sole basis for treatment. Nasal washings and aspirates are unacceptable for Xpert Xpress SARS-CoV-2/FLU/RSV testing.  Fact Sheet for Patients: EntrepreneurPulse.com.au  Fact Sheet for Healthcare Providers: IncredibleEmployment.be  This test is not yet approved or cleared by the [place] FDA and has been authorized for detection and/or diagnosis of SARS-CoV-2 by FDA under an Emergency Use Authorization (EUA). This EUA will remain in effect (meaning this test can be used) for the  duration of the COVID-19 declaration under Section 564(b)(1) of the Act, 21 U.S.C. section 360bbb-3(b)(1), unless the authorization is terminated or revoked.  Performed at [facility], [place] [address] [place], [place] [address]          Radiology Studies: [facility] Chest Port 1 View  Result Date: 05/31/2021 CLINICAL DATA:  Chest tube in place. EXAM: PORTABLE CHEST 1 VIEW COMPARISON:  May 30, 2021. FINDINGS: The heart size and mediastinal contours are within normal limits. Right-sided chest tube is again noted. Mild right apical pneumothorax is noted. Mild bibasilar subsegmental  atelectasis is noted. Multiple displaced right rib fractures are again noted. IMPRESSION: Right-sided chest tube is again noted. Mild right apical pneumothorax is now noted. Multiple displaced right rib fractures are again noted. Aortic Atherosclerosis (ICD10-I70.0). Electronically Signed   By: Marijo Conception M.D.   On: 05/31/2021 08:21   CT [facility] PLEURAL DRAIN W/INDWELL CATH W/IMG GUIDE  Result Date: 06/01/2021 INDICATION: 80 year old female with persisting/recurrent right apical pneumothorax after prior chest tube EXAM: CT-GUIDED THORACOSTOMY TUBE MEDICATIONS: None ANESTHESIA/SEDATION: Fentanyl 25 mcg IV; Versed 0 mg IV Moderate Sedation Time:  0 The patient was continuously monitored during the procedure by the interventional radiology nurse under my direct supervision. COMPLICATIONS: None PROCEDURE: Informed written consent was obtained from the patient after a thorough discussion of the procedural risks, benefits and alternatives. All questions were addressed. Maximal Sterile Barrier Technique was utilized including caps, mask, sterile gowns, sterile gloves, sterile drape, hand hygiene and skin antiseptic. A timeout was performed prior to the initiation of the procedure. Patient was positioned supine position on CT gantry table. Scout CT was acquired for planning purposes. The patient was then prepped and draped in the usual sterile fashion. 1% lidocaine was used for local anesthesia. Small stab incision was made with 11 blade scalpel along the anterior upper right chest wall. Yueh needle was then used to enter the pleural space with aspiration of air. The plastic catheter was advanced and the needle was removed. A wire was placed into the pleural space. Modified Seldinger technique was then used to place a 8.5 French pigtail drainage catheter. Catheter was attached to the evac chamber and we confirmed aspiration of air. Final CT was acquired. Catheter was sutured in position. Patient tolerated the procedure  well and remained hemodynamically stable throughout. No complications were encountered and no significant blood loss. IMPRESSION: Status post CT-guided placement of right apical thoracostomy tube. Signed, Dulcy Fanny. Dellia Nims, RPVI [facility] Radiology Electronically Signed   By: Corrie Mckusick D.O.   On: 06/01/2021 13:16        Scheduled Meds: . acetaminophen  1,000 mg Oral Q8H  . apixaban  10 mg Oral BID   Followed by  . [START ON 06/06/2021] apixaban  5 mg Oral BID  . diltiazem  60 mg Oral Q6H  . feeding supplement  237 mL Oral TID BM  . fentaNYL      . lidocaine      . loratadine  10 mg Oral Daily  . losartan  100 mg Oral Daily  . mouth rinse  15 mL Mouth Rinse BID  . metoprolol tartrate  50 mg Oral BID  . pantoprazole  40 mg Oral Daily   Continuous Infusions:   LOS: 7 days    Time spent: 62 minutes spent on chart review, discussion with nursing staff, consultants, updating family and interview/physical exam; more than 50% of that time was spent in  counseling and/or coordination of care.    Amara Manalang J [place] DO [facility] Available via Epic secure chat 7am-7pm After these hours, please refer to coverage provider listed on [email or web address] 06/01/2021, 4:19 PM

## 2021-06-01 NOTE — Procedures (Signed)
Interventional Radiology Procedure Note  Procedure: Image guided right apical chest tube placement, 54F.  Complications: None  EBL: None   Recommendations: - Now #2 chest tube at the apex. Maintain to suction - The #1 lower chest tube should remain to suction - routine wound care  Signed,  Dulcy Fanny. Earleen Newport, DO

## 2021-06-02 ENCOUNTER — Inpatient Hospital Stay (HOSPITAL_COMMUNITY): Payer: Medicare Other

## 2021-06-02 LAB — BASIC METABOLIC PANEL
Anion gap: 5 (ref 5–15)
BUN: 32 mg/dL — ABNORMAL HIGH (ref 8–23)
CO2: 44 mmol/L — ABNORMAL HIGH (ref 22–32)
Calcium: 10 mg/dL (ref 8.9–10.3)
Chloride: 95 mmol/L — ABNORMAL LOW (ref 98–111)
Creatinine, Ser: 0.57 mg/dL (ref 0.44–1.00)
GFR, Estimated: >60 mL/min (ref >60)
Glucose, Bld: 142 mg/dL — ABNORMAL HIGH (ref 70–99)
Potassium: 4.4 mmol/L (ref 3.5–5.1)
Sodium: 144 mmol/L (ref 135–145)

## 2021-06-02 LAB — CBC
HCT: 36 % (ref 36.0–46.0)
Hemoglobin: 10.4 g/dL — ABNORMAL LOW (ref 12.0–15.0)
MCH: 29.6 pg (ref 26.0–34.0)
MCHC: 28.9 g/dL — ABNORMAL LOW (ref 30.0–36.0)
MCV: 102.6 fL — ABNORMAL HIGH (ref 80.0–100.0)
Platelets: 256 10*3/uL (ref 150–400)
RBC: 3.51 MIL/uL — ABNORMAL LOW (ref 3.87–5.11)
RDW: 13.3 % (ref 11.5–15.5)
WBC: 7.9 10*3/uL (ref 4.0–10.5)
nRBC: 0 % (ref 0.0–0.2)

## 2021-06-02 LAB — MAGNESIUM: Magnesium: 2.1 mg/dL (ref 1.7–2.4)

## 2021-06-02 MED ORDER — PANTOPRAZOLE SODIUM 40 MG PO PACK
40.0000 mg | PACK | Freq: Every day | ORAL | Status: DC
  Filled 2021-06-02: qty 20

## 2021-06-02 MED ORDER — PANTOPRAZOLE SODIUM 40 MG PO TBEC
40.0000 mg | DELAYED_RELEASE_TABLET | Freq: Every day | ORAL | Status: DC
  Administered 2021-06-02 – 2021-06-08 (×7): 40 mg via ORAL
  Filled 2021-06-02 (×7): qty 1

## 2021-06-02 MED ORDER — FENTANYL CITRATE (PF) 100 MCG/2ML IJ SOLN
25.0000 ug | INTRAMUSCULAR | Status: DC | PRN
  Filled 2021-06-02: qty 2

## 2021-06-02 MED ORDER — FENTANYL CITRATE (PF) 100 MCG/2ML IJ SOLN
25.0000 ug | INTRAMUSCULAR | Status: DC | PRN
Start: 2021-06-02 — End: 2021-06-07
  Administered 2021-06-05 – 2021-06-06 (×2): 25 ug via INTRAVENOUS
  Filled 2021-06-02 (×2): qty 2

## 2021-06-02 NOTE — Progress Notes (Signed)
Referring Physician(s):  Dr Johnette Abraham [place]  Supervising Physician: Ruthann Cancer  Patient Status:  [facility] - In-pt  Chief Complaint:  Rt hydroptx Chest tube #1- placed 6/1 in IR Chest tube #2- placed 6/7 in IR  Subjective:  Chest tubes in place #1 continues to leak per RN #2 working well OP for both serous color No air leak either pleurvac  IMPRESSION: 1. Two right chest tubes are noted over the right chest. Right lower chest tube side holes may be within the right chest wall. Persistent right apical pneumothorax noted. 2. Atelectatic changes in the right lung again noted. Mild atelectatic changes left lung base. 3.  Stable cardiomegaly. 4.  Multiple right rib fractures again noted.  Allergies: Latex, Macrodantin, Guaifenesin er, Morphine and related, Ditropan [oxybutynin], Lipitor [atorvastatin], Lisinopril, Magnesium-containing compounds, Pravastatin, and Zoloft [sertraline hcl]  Medications: Prior to Admission medications   Medication Sig Start Date End Date Taking? Authorizing Provider  acetaminophen (TYLENOL) 650 MG CR tablet Take 650 mg by mouth every 8 (eight) hours as needed for fever or pain.   Yes [provider]  CALCIUM-VITAMIN D PO Take 1 tablet by mouth daily.   Yes [provider]  cetirizine (ZYRTEC) 10 MG tablet Take 0.5 tablets (5 mg total) by mouth daily. Patient taking differently: Take 10 mg by mouth daily. 09/09/20  Yes [place], Betty G, MD  diltiazem (CARDIZEM) 30 MG tablet Take 1 tablet (30 mg total) by mouth every 6 (six) hours. 05/20/21  Yes Maczis, Barth Kirks, PA-C  docusate sodium (COLACE) 100 MG capsule Take 1 capsule (100 mg total) by mouth 2 (two) times daily as needed for mild constipation. 05/20/21  Yes Maczis, Barth Kirks, PA-C  fluticasone (FLONASE) 50 MCG/ACT nasal spray Place 1 spray into both nostrils 2 (two) times daily. Patient taking differently: Place 1 spray into both nostrils 2 (two) times daily as needed for allergies.  11/05/19  Yes [place], Betty G, MD  hydrocortisone cream 1 % Apply 1 application topically 4 (four) times daily as needed for itching.   Yes [provider]  LORazepam (ATIVAN) 0.5 MG tablet Take 0.5 mg by mouth 2 (two) times daily as needed for anxiety. 04/30/21  Yes [provider]  metoprolol tartrate (LOPRESSOR) 50 MG tablet Take 1 tablet (50 mg total) by mouth 2 (two) times daily. 05/20/21  Yes Maczis, Barth Kirks, PA-C  oxyCODONE (OXY IR/ROXICODONE) 5 MG immediate release tablet Take 1 tablet (5 mg total) by mouth every 6 (six) hours as needed for breakthrough pain. 05/20/21  Yes Ileana Roup, MD  pantoprazole (PROTONIX) 20 MG tablet Take 20 mg by mouth daily. 05/10/21  Yes [provider]  polyvinyl alcohol (LIQUIFILM TEARS) 1.4 % ophthalmic solution Place 1 drop into both eyes as needed for dry eyes.   Yes [provider]  VITAMIN D PO Take 1 capsule by mouth daily.   Yes [provider]  acetaminophen (TYLENOL) 500 MG tablet Take 2 tablets (1,000 mg total) by mouth every 8 (eight) hours. Patient not taking: Reported on 05/25/2021 05/20/21   Jillyn Ledger, PA-C  LORazepam (ATIVAN) 0.5 MG tablet Take 0.5-1 tablets (0.25-0.5 mg total) by mouth 2 (two) times daily as needed. for anxiety Patient not taking: Reported on 05/25/2021 03/30/21   [place], Betty G, MD  losartan (COZAAR) 100 MG tablet TAKE 1 TABLET BY MOUTH EVERY DAY Patient not taking: Reported on 05/25/2021 03/22/21   [place], Betty G, MD  pantoprazole (PROTONIX) 20 MG tablet  TAKE 1 TABLET BY MOUTH 30 MINUTES BEFORE BREAKFAST. STOP OMEPRAZOLE Patient not taking: Reported on 05/25/2021 05/10/21   [place], Betty G, MD  polyethylene glycol (MIRALAX / GLYCOLAX) 17 g packet Take 17 g by mouth daily as needed. Patient not taking: Reported on 05/25/2021 05/20/21   Jillyn Ledger, PA-C     Vital Signs: BP (!) 103/58 (BP Location: Left Arm)   Pulse 82   Temp 98.1 F (36.7 C) (Oral)   Resp 15   Ht  5\' 8"  (1.727 m)   Wt 132 lb 0.9 oz (59.9 kg)   SpO2 98%   BMI 20.08 kg/m   Physical Exam Skin:    General: Skin is warm.     Comments: sites of chest tubes are clean and dry NT no bleeding CT #1--- I am able to visualize drain hole- outside of skin CT #2--- intact;Clean and dry  No air leak when coughs for either drain  Pulled CT #1-- per Dr Serafina Royals order vaseline dressing placed         Imaging: DG CHEST PORT 1 VIEW  Result Date: 06/02/2021 CLINICAL DATA:  Shortness of breath.  Chest tubes. EXAM: PORTABLE CHEST 1 VIEW COMPARISON:  06/01/2021.  05/31/2021.  CT 05/25/2021. FINDINGS: Two right chest tubes are noted over the right chest. Right lower chest tube side holes may be within the right chest wall. Persistent right apical pneumothorax noted. Atelectatic changes in the right lung again noted. Mild atelectatic changes left lung base. Stable cardiomegaly. Multiple right rib fractures again noted. Degenerative changes thoracic spine. IMPRESSION: 1. Two right chest tubes are noted over the right chest. Right lower chest tube side holes may be within the right chest wall. Persistent right apical pneumothorax noted. 2. Atelectatic changes in the right lung again noted. Mild atelectatic changes left lung base. 3.  Stable cardiomegaly. 4.  Multiple right rib fractures again noted. Electronically Signed   By: Marcello Moores  Register   On: 06/02/2021 06:46   DG Chest Port 1 View  Result Date: 05/31/2021 CLINICAL DATA:  Chest tube in place. EXAM: PORTABLE CHEST 1 VIEW COMPARISON:  May 30, 2021. FINDINGS: The heart size and mediastinal contours are within normal limits. Right-sided chest tube is again noted. Mild right apical pneumothorax is noted. Mild bibasilar subsegmental atelectasis is noted. Multiple displaced right rib fractures are again noted. IMPRESSION: Right-sided chest tube is again noted. Mild right apical pneumothorax is now noted. Multiple displaced right rib fractures are again noted.  Aortic Atherosclerosis (ICD10-I70.0). Electronically Signed   By: Marijo Conception M.D.   On: 05/31/2021 08:21   DG Chest Port 1 View  Result Date: 05/30/2021 CLINICAL DATA:  Chest tube EXAM: PORTABLE CHEST 1 VIEW COMPARISON:  05/29/2021 FINDINGS: Small bore RIGHT chest tube in place. RIGHT apical pneumothorax not appreciated on today's exam. Mild atelectasis and effusion at the RIGHT lung base. Multiple posterior RIGHT rib fractures noted. LEFT lung clear. IMPRESSION: 1. Chest tube in place on the RIGHT with no pneumothorax appreciated. 2. Multiple posterior RIGHT rib fractures and RIGHT basilar atelectasis. Electronically Signed   By: Suzy Bouchard M.D.   On: 05/30/2021 07:38   CT [facility] PLEURAL DRAIN W/INDWELL CATH W/IMG GUIDE  Result Date: 06/01/2021 INDICATION: 80 year old female with persisting/recurrent right apical pneumothorax after prior chest tube EXAM: CT-GUIDED THORACOSTOMY TUBE MEDICATIONS: None ANESTHESIA/SEDATION: Fentanyl 25 mcg IV; Versed 0 mg IV Moderate Sedation Time:  0 The patient was continuously monitored during the procedure by the interventional radiology nurse under  my direct supervision. COMPLICATIONS: None PROCEDURE: Informed written consent was obtained from the patient after a thorough discussion of the procedural risks, benefits and alternatives. All questions were addressed. Maximal Sterile Barrier Technique was utilized including caps, mask, sterile gowns, sterile gloves, sterile drape, hand hygiene and skin antiseptic. A timeout was performed prior to the initiation of the procedure. Patient was positioned supine position on CT gantry table. Scout CT was acquired for planning purposes. The patient was then prepped and draped in the usual sterile fashion. 1% lidocaine was used for local anesthesia. Small stab incision was made with 11 blade scalpel along the anterior upper right chest wall. Yueh needle was then used to enter the pleural space with aspiration of air. The  plastic catheter was advanced and the needle was removed. A wire was placed into the pleural space. Modified Seldinger technique was then used to place a 8.5 French pigtail drainage catheter. Catheter was attached to the evac chamber and we confirmed aspiration of air. Final CT was acquired. Catheter was sutured in position. Patient tolerated the procedure well and remained hemodynamically stable throughout. No complications were encountered and no significant blood loss. IMPRESSION: Status post CT-guided placement of right apical thoracostomy tube. Signed, Dulcy Fanny. Dellia Nims, RPVI [facility] Electronically Signed   By: Corrie Mckusick D.O.   On: 06/01/2021 13:16    Labs:  CBC: Recent Labs    05/28/21 0347 05/29/21 0241 05/30/21 0305 06/02/21 0314  WBC 8.0 8.1 6.7 7.9  HGB 10.2* 10.1* 9.7* 10.4*  HCT 32.1* 32.5* 31.6* 36.0  PLT 339 332 272 256    COAGS: Recent Labs    05/11/21 0035  INR 1.0    BMP: Recent Labs    09/09/20 1222 03/30/21 1301 05/25/21 1356 05/26/21 0330 05/30/21 0305 06/02/21 0314  NA 136   < > 134* 134* 143 144  K 4.0   < > 4.6 4.7 4.2 4.4  CL 99   < > 94* 90* 99 95*  CO2 32   < > 33* 34* 36* 44*  GLUCOSE 88   < > 111* 122* 113* 142*  BUN 18   < > 19 19 24* 32*  CALCIUM 9.9   < > 9.5 9.5 9.6 10.0  CREATININE 0.87   < > 0.72 0.66 0.60 0.57  GFRNONAA 62   < > >60 >60 >60 >60  GFRAA 72  --   --   --   --   --    < > = values in this interval not displayed.    LIVER FUNCTION TESTS: Recent Labs    03/30/21 1301 05/11/21 0035 05/25/21 1358 05/26/21 0330  BILITOT 0.5 0.5 0.6 0.4  AST 18 25 26 20   ALT 10 13 14 19   ALKPHOS 52 54 53 52  PROT 7.3 6.7 6.5 6.4*  ALBUMIN 4.4 3.6 3.2* 3.1*    Assessment and Plan:  Left hydroptx Chest tube #1 - removed today-- no complications Drain side hole- exposed-- removal per Dr Serafina Royals order Chest tube #2- intact- draining well; no air leak Check CXR 6  pm tonight   Electronically Signed: Lavonia Drafts, PA-C 06/02/2021, 11:53 AM   I spent a total of 15 Minutes at the the patient's bedside AND on the patient's hospital floor or unit, greater than 50% of which was counseling/coordinating care for chest tube drain-- hydroptx

## 2021-06-02 NOTE — Progress Notes (Signed)
PROGRESS NOTE    Melanie [name]  BPZ:[id number] DOB: 09/04/1941 DOA: 05/25/2021 PCP: [place], Betty G, MD    Brief Narrative:  Melanie [name] is a 80 year old female with past medical history significant for COPD, multinodular goiter, essential hypertension, anxiety disorder, overactive bladder, paroxysmal atrial fibrillation, recent traumatic hemopneumothorax who presented to the ED with bilateral lower extremity edema.  Also her oxygen requirement has increased to 4 L/min with CT scan in the ED showing no evidence of pulmonary embolism but with moderate right hydropneumothorax with loculated effusion right lower lobe with associated atelectasis.  Patient was recently admitted to [facility] trauma service from 05/11/2021 through 05/20/2021 following fall out of bed with subsequent right-sided rib fractures with hemopneumothorax.  During that hospitalization, patient was intubated, underwent IR thoracentesis, right pigtail drain placement which was removed prior to her discharge.  Patient also developed A. fib with RVR and started on metoprolol and Cardizem per cardiology, Dr. Einar Gip with outpatient follow-up recommended.  Patient was discharged on 3 L nasal cannula.  Assessment & Plan:   Principal Problem:   Atrial fibrillation with RVR (HCC) Active Problems:   Primary hypertension   Hypercholesteremia   Multinodular goiter   Overactive bladder   GERD (gastroesophageal reflux disease)   Peripheral neuropathy due to chemotherapy (HCC)   Anxiety disorder   Closed fracture of multiple ribs with flail chest   Hydropneumothorax   Malnutrition of moderate degree   Atrial fibrillation with RVR TTE 05/18/2021 limited by pain due to flail chest and hydropneumothorax but with grossly preserved LVEF. --Cardizem 60 mg p.o. 4 times daily --Eliquis for anticoagulation --Continue monitor on telemetry  Right leg DVT, recently diagnosed --Eliquis   Right-sided hydropneumothorax with loculated  effusion, recurrent Multiple right-sided rib fractures During previous hospitalization, patient had pigtail catheter placed which was removed prior to discharge.  Patient's O2 requirements have increased since discharge and CT chest notable for moderate right hemopneumothorax with associated loculated effusion/atelectasis. --s/p chest tube 6/1 by IR, removed 6/8 --s/p right apical chest tube by IR 06/01/21 --Tylenol 1000 mg p.o. every 8 hours scheduled --Oxycodone 2.5 mg p.o. every 6 hours as needed breakthrough pain --Monitor apical chest tube output closely --Repeat chest x-ray this evening following chest tube removal per IR  Essential hypertension BP 119/55 this morning --Losartan 100 mg p.o. daily --Metoprolol tartrate 50 mg p.o. twice daily --Cardizem 60 mg p.o. every 6 hours --Continue monitor BP closely  GERD --Protonix 40 mg p.o. daily  Anxiety --Lorazepam 0.5 mg p.o. every 6 hours as needed anxiety  Moderate protein calorie malnutrition Body mass index is 20.08 kg/m. Nutrition Status: Nutrition Problem: Moderate Malnutrition Etiology: chronic illness (COPD) Signs/Symptoms: moderate fat depletion,mild muscle depletion,severe muscle depletion Interventions: Ensure Enlive (each supplement provides 350kcal and 20 grams of protein),MVI --Dietitian following --Liberalize diet to regular --Continue to encourage increased oral intake, supplement use  Adult failure to thrive Patient is very thin and cachectic in appearance.  Family report poor oral intake for some time.  Patient with moderate protein calorie malnutrition.  Patient with recent fall from bed sustaining multiple right-sided rib fractures with associated hemopneumothorax as above.  Family meeting with palliative care was performed on 06/01/2021. --Palliative care following for goals of care and medical decision making.      DVT prophylaxis: Place and maintain sequential compression device Start: 05/25/21  1725 apixaban (ELIQUIS) tablet 10 mg  apixaban (ELIQUIS) tablet 5 mg     Code Status: DNR Family Communication: No family members present  at bedside this morning, updated extensively to multiple family merged yesterday during palliative care meeting.  Disposition Plan:  Level of care: Progressive Status is: Inpatient  Remains inpatient appropriate because:Ongoing active pain requiring inpatient pain management, Ongoing diagnostic testing needed not appropriate for outpatient work up, Unsafe d/c plan, IV treatments appropriate due to intensity of illness or inability to take PO and Inpatient level of care appropriate due to severity of illness   Dispo: The patient is from: Home              Anticipated d/c is to: SNF              Patient currently is not medically stable to d/c.   Difficult to place patient No   Consultants:   Interventional radiology  Palliative care  Procedures:   10 F right-sided chest tube, IR, Dr. Laurence Ferrari; 6/1; removed 6/8  79F right apical chest tube, IR, Dr. Earleen Newport; 6/7  Antimicrobials:   none     Subjective: Patient seen examined bedside, resting comfortably.  Patient reports shortness of breath as well as pain improved.  IR removed one of the chest tubes today.  Continues with apical chest tube in place.  No other questions or concerns at this time.  Patient denies headache, no dizziness, no chest pain, no fever/chills/night sweats, no nausea/vomiting/diarrhea, no abdominal pain.  No acute concerns overnight per nursing staff.  Objective: Vitals:   06/01/21 2319 06/02/21 0546 06/02/21 0700 06/02/21 1100  BP: (!) 135/57 (!) 119/55 (!) 154/92 (!) 103/58  Pulse: 71 90  82  Resp: 18 19 17 15   Temp:  98.5 F (36.9 C) 98.1 F (36.7 C) 98.1 F (36.7 C)  TempSrc:  Oral Oral Oral  SpO2: 96% 98%    Weight:      Height:        Intake/Output Summary (Last 24 hours) at 06/02/2021 1504 Last data filed at 06/02/2021 0900 Gross per 24 hour  Intake  --  Output 200 ml  Net -200 ml   Filed Weights   05/25/21 1745 05/26/21 1200  Weight: 61.2 kg 59.9 kg    Examination:  General exam: Appears calm and comfortable, thin/cachectic/chronically ill in appearance Respiratory system: Breath sounds decreased right base, no wheezing/crackles, normal respiratory effort, on 3 L nasal cannula with SPO2 98% at rest.  Chest tubes noted to suction. Cardiovascular system: S1 & S2 heard, RRR. No JVD, murmurs, rubs, gallops or clicks. No pedal edema. Gastrointestinal system: Abdomen is nondistended, soft and nontender. No organomegaly or masses felt. Normal bowel sounds heard. Central nervous system: Alert and oriented. No focal neurological deficits. Extremities: Symmetric 5 x 5 power. Skin: No rashes, lesions or ulcers Psychiatry: Judgement and insight appear normal. Mood & affect appropriate.     Data Reviewed: I have personally reviewed following labs and imaging studies  CBC: Recent Labs  Lab 05/27/21 0154 05/28/21 0347 05/29/21 0241 05/30/21 0305 06/02/21 0314  WBC 8.2 8.0 8.1 6.7 7.9  HGB 9.7* 10.2* 10.1* 9.7* 10.4*  HCT 31.1* 32.1* 32.5* 31.6* 36.0  MCV 96.0 96.4 97.6 96.9 102.6*  PLT 376 339 332 272 998   Basic Metabolic Panel: Recent Labs  Lab 05/30/21 0305 06/02/21 0314  NA 143 144  K 4.2 4.4  CL 99 95*  CO2 36* 44*  GLUCOSE 113* 142*  BUN 24* 32*  CREATININE 0.60 0.57  CALCIUM 9.6 10.0  MG  --  2.1   GFR: Estimated Creatinine Clearance: 52.2 mL/min (by C-G  formula based on SCr of 0.57 mg/dL). Liver Function Tests: No results for input(s): AST, ALT, ALKPHOS, BILITOT, PROT, ALBUMIN in the last 168 hours. No results for input(s): LIPASE, AMYLASE in the last 168 hours. No results for input(s): AMMONIA in the last 168 hours. Coagulation Profile: No results for input(s): INR, PROTIME in the last 168 hours. Cardiac Enzymes: No results for input(s): CKTOTAL, CKMB, CKMBINDEX, TROPONINI in the last 168 hours. BNP (last  3 results) Recent Labs    03/30/21 1301  PROBNP 97.0   HbA1C: No results for input(s): HGBA1C in the last 72 hours. CBG: No results for input(s): GLUCAP in the last 168 hours. Lipid Profile: No results for input(s): CHOL, HDL, LDLCALC, TRIG, CHOLHDL, LDLDIRECT in the last 72 hours. Thyroid Function Tests: No results for input(s): TSH, T4TOTAL, FREET4, T3FREE, THYROIDAB in the last 72 hours. Anemia Panel: No results for input(s): VITAMINB12, FOLATE, FERRITIN, TIBC, IRON, RETICCTPCT in the last 72 hours. Sepsis Labs: No results for input(s): PROCALCITON, LATICACIDVEN in the last 168 hours.  Recent Results (from the past 240 hour(s))  Resp Panel by RT-PCR (Flu A&B, Covid) Nasopharyngeal Swab     Status: None   Collection Time: 05/25/21  1:21 PM   Specimen: Nasopharyngeal Swab; Nasopharyngeal(NP) swabs in vial transport medium  Result Value Ref Range Status   SARS Coronavirus 2 by RT PCR NEGATIVE NEGATIVE Final    Comment: (NOTE) SARS-CoV-2 target nucleic acids are NOT DETECTED.  The SARS-CoV-2 RNA is generally detectable in upper respiratory specimens during the acute phase of infection. The lowest concentration of SARS-CoV-2 viral copies this assay can detect is 138 copies/mL. A negative result does not preclude SARS-Cov-2 infection and should not be used as the sole basis for treatment or other patient management decisions. A negative result may occur with  improper specimen collection/handling, submission of specimen other than nasopharyngeal swab, presence of viral mutation(s) within the areas targeted by this assay, and inadequate number of viral copies(<138 copies/mL). A negative result must be combined with clinical observations, patient history, and epidemiological information. The expected result is Negative.  Fact Sheet for Patients:  EntrepreneurPulse.com.au  Fact Sheet for Healthcare Providers:  IncredibleEmployment.be  This  test is no t yet approved or cleared by the [place] FDA and  has been authorized for detection and/or diagnosis of SARS-CoV-2 by FDA under an Emergency Use Authorization (EUA). This EUA will remain  in effect (meaning this test can be used) for the duration of the COVID-19 declaration under Section 564(b)(1) of the Act, 21 U.S.C.section 360bbb-3(b)(1), unless the authorization is terminated  or revoked sooner.       Influenza A by PCR NEGATIVE NEGATIVE Final   Influenza B by PCR NEGATIVE NEGATIVE Final    Comment: (NOTE) The Xpert Xpress SARS-CoV-2/FLU/RSV plus assay is intended as an aid in the diagnosis of influenza from Nasopharyngeal swab specimens and should not be used as a sole basis for treatment. Nasal washings and aspirates are unacceptable for Xpert Xpress SARS-CoV-2/FLU/RSV testing.  Fact Sheet for Patients: EntrepreneurPulse.com.au  Fact Sheet for Healthcare Providers: IncredibleEmployment.be  This test is not yet approved or cleared by the [place] FDA and has been authorized for detection and/or diagnosis of SARS-CoV-2 by FDA under an Emergency Use Authorization (EUA). This EUA will remain in effect (meaning this test can be used) for the duration of the COVID-19 declaration under Section 564(b)(1) of the Act, 21 U.S.C. section 360bbb-3(b)(1), unless the authorization is terminated or revoked.  Performed at [facility]  [place] [address], [place], [place] [address]          Radiology Studies: DG CHEST PORT 1 VIEW  Result Date: 06/02/2021 CLINICAL DATA:  Shortness of breath.  Chest tubes. EXAM: PORTABLE CHEST 1 VIEW COMPARISON:  06/01/2021.  05/31/2021.  CT 05/25/2021. FINDINGS: Two right chest tubes are noted over the right chest. Right lower chest tube side holes may be within the right chest wall. Persistent right apical pneumothorax noted. Atelectatic changes in the right lung again noted.  Mild atelectatic changes left lung base. Stable cardiomegaly. Multiple right rib fractures again noted. Degenerative changes thoracic spine. IMPRESSION: 1. Two right chest tubes are noted over the right chest. Right lower chest tube side holes may be within the right chest wall. Persistent right apical pneumothorax noted. 2. Atelectatic changes in the right lung again noted. Mild atelectatic changes left lung base. 3.  Stable cardiomegaly. 4.  Multiple right rib fractures again noted. Electronically Signed   By: Marcello Moores  Register   On: 06/02/2021 06:46   CT PERC PLEURAL DRAIN W/INDWELL CATH W/IMG GUIDE  Result Date: 06/01/2021 INDICATION: 80 year old female with persisting/recurrent right apical pneumothorax after prior chest tube EXAM: CT-GUIDED THORACOSTOMY TUBE MEDICATIONS: None ANESTHESIA/SEDATION: Fentanyl 25 mcg IV; Versed 0 mg IV Moderate Sedation Time:  0 The patient was continuously monitored during the procedure by the interventional radiology nurse under my direct supervision. COMPLICATIONS: None PROCEDURE: Informed written consent was obtained from the patient after a thorough discussion of the procedural risks, benefits and alternatives. All questions were addressed. Maximal Sterile Barrier Technique was utilized including caps, mask, sterile gowns, sterile gloves, sterile drape, hand hygiene and skin antiseptic. A timeout was performed prior to the initiation of the procedure. Patient was positioned supine position on CT gantry table. Scout CT was acquired for planning purposes. The patient was then prepped and draped in the usual sterile fashion. 1% lidocaine was used for local anesthesia. Small stab incision was made with 11 blade scalpel along the anterior upper right chest wall. Yueh needle was then used to enter the pleural space with aspiration of air. The plastic catheter was advanced and the needle was removed. A wire was placed into the pleural space. Modified Seldinger technique was then  used to place a 8.5 French pigtail drainage catheter. Catheter was attached to the evac chamber and we confirmed aspiration of air. Final CT was acquired. Catheter was sutured in position. Patient tolerated the procedure well and remained hemodynamically stable throughout. No complications were encountered and no significant blood loss. IMPRESSION: Status post CT-guided placement of right apical thoracostomy tube. Signed, Dulcy Fanny. Dellia Nims, RPVI [facility] Electronically Signed   By: Corrie Mckusick D.O.   On: 06/01/2021 13:16        Scheduled Meds: . acetaminophen  1,000 mg Oral Q8H  . apixaban  10 mg Oral BID   Followed by  . [START ON 06/06/2021] apixaban  5 mg Oral BID  . diltiazem  60 mg Oral Q6H  . feeding supplement  237 mL Oral TID BM  . loratadine  10 mg Oral Daily  . losartan  100 mg Oral Daily  . mouth rinse  15 mL Mouth Rinse BID  . metoprolol tartrate  50 mg Oral BID  . pantoprazole  40 mg Oral Daily   Continuous Infusions:   LOS: 8 days    Time spent: 42 minutes spent on chart review, discussion with nursing staff, consultants, updating  family and interview/physical exam; more than 50% of that time was spent in counseling and/or coordination of care.    Aija Scarfo J [place] DO [facility] Available via Epic secure chat 7am-7pm After these hours, please refer to coverage provider listed on [email or web address] 06/02/2021, 3:04 PM

## 2021-06-02 NOTE — Progress Notes (Signed)
Nutrition Follow-up  DOCUMENTATION CODES:   Non-severe (moderate) malnutrition in context of chronic illness  INTERVENTION:    Continue Ensure Enlive po TID, each supplement provides 350 kcal and 20 grams of protein.  Continue MVI with minerals daily.  Consider scheduled bowel regimen, LBM recorded 5 days ago.  NUTRITION DIAGNOSIS:   Moderate Malnutrition related to chronic illness (COPD) as evidenced by moderate fat depletion,mild muscle depletion,severe muscle depletion.  GOAL:   Patient will meet greater than or equal to 90% of their needs  MONITOR:   PO intake,Supplement acceptance  REASON FOR ASSESSMENT:   Malnutrition Screening Tool    ASSESSMENT:   80 yo female admitted with bilateral leg swelling, R leg DVT, R hydropneumothorax. PMH includes rectal cancer, multinodular goiter, HTN, A fib with RVR, COPD, anxiety.   Patient is now on a regular diet with thin liquids. She continues to have a poor appetite. Meal intakes not recorded. She is drinking Ensure supplements 2-3 times per day.   S/P placement of R side chest tube 6/1, 80 ml output x 24 hours - removed today. Chest tube #2 placed 6/7, remains in place with 40 ml output x 24 hours.  Labs reviewed.  Medications reviewed and include Protonix.  Palliative care team following. Patient wants to continue supportive care with hopes that she can improve.  Diet Order:   Diet Order            Diet regular Room service appropriate? Yes; Fluid consistency: Thin  Diet effective now                 EDUCATION NEEDS:   Not appropriate for education at this time  Skin:  Skin Assessment: Reviewed RN Assessment (R chest tube)  Last BM:  6/3 type 5  Height:   Ht Readings from Last 1 Encounters:  05/25/21 5\' 8"  (1.727 m)    Weight:   Wt Readings from Last 1 Encounters:  05/26/21 59.9 kg    Ideal Body Weight:  63.6 kg  BMI:  Body mass index is 20.08 kg/m.  Estimated Nutritional Needs:   Kcal:   1500-1750  Protein:  80-90 gm  Fluid:  >/= 1.6 L    Lucas Mallow, RD, LDN, CNSC Please refer to Amion for contact information.

## 2021-06-02 NOTE — Progress Notes (Signed)
Physical Therapy Treatment Patient Details Name: Melanie [name] MRN: [id number] DOB: May 15, 1941 Today's Date: 06/02/2021    History of Present Illness Pt is an 80 y.o. female who recently was admitted to [facility] from 05/11/2021-05/20/2021 with bilateral leg swelling following R-sided rib fractures with traumatic hemopneumothorax. During that admission pt developed a fib with RVR. Daughter reports that pt experiences intermitten SOB with exertion and progressive increase in leg swelling following discharge from hospital, re-admitted on 05/25/2021. Moderate right hydropneumothorax on CT with loculated effusion in the right lower lobe and associated atelectasis of the right lower lobe. Stable appearance of right segmental rib fractures and flail chest. PMH significant for COPD, multinodular goiter, essential hypertension, anxiety disorder, overactive bladder, A. fib with RVR.    PT Comments    Pt reports being fatigued, but agrees to participate in PT today. Pt demonstrates ability to perform bed mobility without the need for physical assistance to complete. Pt performs transfers, requiring some physical assistance and cueing for hand placement. Pt tolerates taking a few steps from the [facility] to recliner, without an AD and physical assistance. Pt's activity tolerance during session was limited secondary to fatigue.  Pt demonstrates deficits in strength, endurance, power, activity tolerance, and overall mobility and will benefit from acute PT to improve safety and independence with mobility. SPT recommends SNF placement due to ongoing requirements for physical assistance and safety with mobility.   Follow Up Recommendations  SNF     Equipment Recommendations  3in1 (PT)    Recommendations for Other Services       Precautions / Restrictions Precautions Precautions: Fall Precaution Comments: chest tube to wall suction Restrictions Weight Bearing Restrictions: No    Mobility  Bed Mobility Overal bed  mobility: Needs Assistance Bed Mobility: Supine to Sit     Supine to sit: HOB elevated;Modified independent (Device/Increase time)     General bed mobility comments: pt requires increased time to perform bed mobility with HOB elevated    Transfers Overall transfer level: Needs assistance Equipment used: None Transfers: Squat Pivot Transfers Sit to Stand: Min assist Stand pivot transfers: Min assist       General transfer comment: pt performs stand>pivot transfer to [facility], requiring cues for handplacement and HHA. Pt performs sit>strand transfer with min A for pericare and takes steps towards recliner to sit with min G assist for safety.  Ambulation/Gait Ambulation/Gait assistance: Min guard Gait Distance (Feet): 2 Feet Assistive device: None Gait Pattern/deviations: Step-through pattern;Decreased stride length;Trunk flexed Gait velocity: decreased Gait velocity interpretation: <1.31 ft/sec, indicative of household ambulator General Gait Details: Pt takes a few steps from [facility] to recliner with min G assist for safety.   Stairs             Wheelchair Mobility    Modified Rankin (Stroke Patients Only)       Balance Overall balance assessment: Needs assistance Sitting-balance support: Feet supported Sitting balance-Leahy Scale: Good     Standing balance support: During functional activity;Single extremity supported;No upper extremity supported Standing balance-Leahy Scale: Fair Standing balance comment: min G for safety during peri care.                            Cognition Arousal/Alertness: Awake/alert Behavior During Therapy: WFL for tasks assessed/performed Overall Cognitive Status: No family/caregiver present to determine baseline cognitive functioning Area of Impairment: Awareness;Problem solving;Following commands  Following Commands: Follows one step commands consistently;Follows one step commands with increased  time   Awareness: Emergent Problem Solving: Requires verbal cues;Slow processing General Comments: Pt follows one step commands consistently and is demonstrating increased awareness of lines/tubes.      Exercises      General Comments General comments (skin integrity, edema, etc.): Pt reports feeling worn out at start of session, but agrees to perform bed mobility and transfers. Pt denies any pain.      Pertinent Vitals/Pain Pain Assessment: No/denies pain    Home Living                      Prior Function            PT Goals (current goals can now be found in the care plan section) Acute Rehab PT Goals Patient Stated Goal: Get better and go home Progress towards PT goals: Progressing toward goals    Frequency    Min 2X/week      PT Plan      Co-evaluation              AM-PAC PT "6 Clicks" Mobility   Outcome Measure  Help needed turning from your back to your side while in a flat bed without using bedrails?: A Little Help needed moving from lying on your back to sitting on the side of a flat bed without using bedrails?: A Little Help needed moving to and from a bed to a chair (including a wheelchair)?: A Little Help needed standing up from a chair using your arms (e.g., wheelchair or bedside chair)?: A Little Help needed to walk in hospital room?: A Little Help needed climbing 3-5 steps with a railing? : A Lot 6 Click Score: 17    End of Session Equipment Utilized During Treatment: Gait belt Activity Tolerance: Patient limited by fatigue Patient left: in chair;with call bell/phone within reach;with chair alarm set Nurse Communication: Mobility status PT Visit Diagnosis: Other abnormalities of gait and mobility (R26.89);Muscle weakness (generalized) (M62.81);History of falling (Z91.81);Difficulty in walking, not elsewhere classified (R26.2)     Time: 7341-9379 PT Time Calculation (min) (ACUTE ONLY): 23 min  Charges:  $Therapeutic Activity:  23-37 mins                     Acute Rehab  Pager: [phone]    Garwin [employer], SPT  06/02/2021, 5:52 PM

## 2021-06-02 NOTE — Progress Notes (Signed)
Occupational Therapy Treatment Patient Details Name: Melanie [name] MRN: [id number] DOB: May 11, 1941 Today's Date: 06/02/2021    History of present illness Pt is an 80 y.o. female who recently was admitted to [facility] from 05/11/2021-05/20/2021 with bilateral leg swelling following R-sided rib fractures with traumatic hemopneumothorax. During that admission pt developed a fib with RVR. Daughter reports that pt experiences intermitten SOB with exertion and progressive increase in leg swelling following discharge from hospital, re-admitted on 05/25/2021. Moderate right hydropneumothorax on CT with loculated effusion in the right lower lobe and associated atelectasis of the right lower lobe. Stable appearance of right segmental rib fractures and flail chest. PMH significant for COPD, multinodular goiter, essential hypertension, anxiety disorder, overactive bladder, A. fib with RVR.   OT comments  Patient supine in bed and agreeable to OT session.  Transferred to [facility] with min assist, toileting with min assist.  She requires increased time for all activities due to fatiguing easily, requires cueing for PLB and safety.  She is thankful to sit in recliner this morning.  SpO2 on 4L during session, VSS (when pleuth reading >92%).  Will follow acutely.    Follow Up Recommendations  Home health OT;Supervision/Assistance - 24 hour (may need SNF if unable to have 24/7)    Equipment Recommendations  None recommended by OT    Recommendations for Other Services      Precautions / Restrictions Precautions Precautions: Fall Precaution Comments: chest tube to wall suction Restrictions Weight Bearing Restrictions: No       Mobility Bed Mobility Overal bed mobility: Needs Assistance Bed Mobility: Supine to Sit     Supine to sit: Min guard     General bed mobility comments: pt reaching for UE support to ascend trunk to EOB    Transfers Overall transfer level: Needs assistance Equipment used:  None Transfers: Sit to/from [facility] Sit to Stand: Min assist Stand pivot transfers: Min assist       General transfer comment: sit to stand with min assist, pivoting to [facility] and then to recliner with min assist for safety, balance and line mgmt    Balance Overall balance assessment: Needs assistance Sitting-balance support: Feet supported Sitting balance-Leahy Scale: Good Sitting balance - Comments: supervision for safety   Standing balance support: During functional activity;Single extremity supported Standing balance-Leahy Scale: Fair Standing balance comment: min guard with 1 hand support during ADLs                           ADL either performed or assessed with clinical judgement   ADL Overall ADL's : Needs assistance/impaired                         Toilet Transfer: Minimal assistance;Stand-pivot;BSC Toilet Transfer Details (indicate cue type and reason): to [place]- Clothing Manipulation and Hygiene: Minimal assistance;Sit to/from stand Toileting - Clothing Manipulation Details (indicate cue type and reason): min assist for clothing mgmt, min guard in standing during hygiene     Functional mobility during ADLs: Minimal assistance General ADL Comments: pt fatigues quickly, requires incraesed time and rest breaks; requires cueing for PLB techniques     Vision       Perception     Praxis      Cognition Arousal/Alertness: Lethargic Behavior During Therapy: WFL for tasks assessed/performed Overall Cognitive Status: No family/caregiver present to determine baseline cognitive functioning Area of Impairment: Safety/judgement;Awareness;Problem solving  Safety/Judgement: Decreased awareness of safety;Decreased awareness of deficits Awareness: Emergent Problem Solving: Requires verbal cues General Comments: pt lethargic, able to follow commands and engage appropraitely.  able to demonstrate  good awareness of safety with lines        Exercises     Shoulder Instructions       General Comments pt on 4L during session with SPo2, flucutating but when good pleuth pt with SpO2 >92%    Pertinent Vitals/ Pain       Pain Assessment: No/denies pain  Home Living                                          Prior Functioning/Environment              Frequency  Min 2X/week        Progress Toward Goals  OT Goals(current goals can now be found in the care plan section)  Progress towards OT goals: Progressing toward goals  Acute Rehab OT Goals Patient Stated Goal: Get better and go home OT Goal Formulation: With patient  Plan Discharge plan remains appropriate;Frequency remains appropriate    Co-evaluation                 AM-PAC OT "6 Clicks" Daily Activity     Outcome Measure   Help from another person eating meals?: A Little Help from another person taking care of personal grooming?: A Little Help from another person toileting, which includes using toliet, bedpan, or urinal?: A Little Help from another person bathing (including washing, rinsing, drying)?: A Lot Help from another person to put on and taking off regular upper body clothing?: A Little Help from another person to put on and taking off regular lower body clothing?: A Lot 6 Click Score: 16    End of Session Equipment Utilized During Treatment: Oxygen  OT Visit Diagnosis: Unsteadiness on feet (R26.81);Pain;Repeated falls (R29.6)   Activity Tolerance Patient limited by fatigue   Patient Left in chair;with call bell/phone within reach;with chair alarm set   Nurse Communication Mobility status;Other (comment) (toileting)        Time: 0762-2633 OT Time Calculation (min): 23 min  Charges: OT General Charges $OT Visit: 1 Visit OT Treatments $Self Care/Home Management : 23-37 mins  [place] Pager [phone] Office  [phone]    Delight Stare 06/02/2021, 9:56 AM

## 2021-06-02 NOTE — Progress Notes (Signed)
This chaplain is present at the bedside with the Pt., notary, and witnesses for the notarizing of the Pt. HCPOA.  The Pt. named Melanie [name] as her healthcare agent.  The chaplain gave the Pt. the original AD:  HCPOA and one copy. The documents were placed in the Pt. bedside chest per the Pt. request. A copy was scanned to the Pt. EMR.  Per Joe's request a copy of the HCPOA was secure-emailed to [email or web address].  This chaplain is available for F/U spiritual care as needed.

## 2021-06-02 NOTE — Progress Notes (Signed)
  Speech Language Pathology Treatment: Dysphagia  Patient Details Name: Melanie [name] MRN: [id number] DOB: [birth year] Today's Date: 06/02/2021 Time: 1152-1205 SLP Time Calculation (min) (ACUTE ONLY): 13 min  Assessment / Plan / Recommendation Clinical Impression  Followed up for diet tolerance. Per pt and nurse pt has been able to order from menu preference foods however intake remains overall reduced. Assessed with preference snack of puree and thin liquids. Pt fully upright and implementing small bites and sips. No overt difficulty exhibited. No further ST needs identified.    HPI HPI: Melanie [name] is a 80 y.o. female with medical history significant of COPD, multinodular goiter, essential hypertension, anxiety disorder, overactive bladder, A. fib with RVR, recent traumatic hemopneumothorax presented in the ED with bilateral leg swelling.  Patient was recently admitted to the [facility] trauma service from 05/11/21- 05/20/21 for right-sided rib fractures with hemopneumothorax following fall from a bed. Per RN pt with coughing during PO consumption and difficulty with PO meds.      SLP Plan  Discharge SLP treatment due to (comment);All goals met       Recommendations  Diet recommendations: Regular;Thin liquid Liquids provided via: Cup;Straw Medication Administration: Whole meds with puree Supervision: Staff to assist with self feeding;Full supervision/cueing for compensatory strategies Compensations: Minimize environmental distractions;Slow rate;Small sips/bites Postural Changes and/or Swallow Maneuvers: Seated upright 90 degrees                Oral Care Recommendations: Oral care BID Follow up Recommendations: 24 hour supervision/assistance SLP Visit Diagnosis: Dysphagia, unspecified (R13.10) Plan: Discharge SLP treatment due to (comment);All goals met       GO                Hayden Rasmussen MA, CCC-SLP Acute Rehabilitation Services   06/02/2021, 12:16 PM

## 2021-06-02 NOTE — Progress Notes (Signed)
Daily Progress Note   Patient Name: Melanie [name]       Date: 06/02/2021 DOB: [birth year]  Age: 80 y.o. MRN#: [id number] Attending Physician: [place] Eric J, DO Primary Care Physician: [place], Betty G, MD Admit Date: 05/25/2021  Reason for Consultation/Follow-up: Establishing goals of care, Psychosocial/spiritual support and HCPOA confirmation  Subjective: As I enter the room I reintroduced myself and she confirms she remembers me from the Palliative visit/family meeting yesterday. She's OOB and in the chair. She states she's not feeling good, just very tired. No pain, N/V, shortness of breath. Discussed yesterday meeting with overall goal of strengthing to be able to go home, which she confirms. We discussed taking it day by day and continuing to discuss goals and wishes as her clinical picture progresses, which she agrees. States she had pudding this morning for her breakfast. Has been drinking ensure this morning, which she enjoys. Not a fan of the chocolate flavor but enjoys the other flavors, especially vanilla.   We discuss and confirm that she wants her grandson, Melanie [name], to be her 71. We discuss a visit from the chaplain and notary to legalize this wish for her designated HCPOA and she agrees to this. She also states she would like the chaplain to be with her for a time for prayer and other spiritual needs.   Length of Stay: 8  Current Medications: Scheduled Meds:  . acetaminophen  1,000 mg Oral Q8H  . apixaban  10 mg Oral BID   Followed by  . [START ON 06/06/2021] apixaban  5 mg Oral BID  . diltiazem  60 mg Oral Q6H  . feeding supplement  237 mL Oral TID BM  . loratadine  10 mg Oral Daily  . losartan  100 mg Oral Daily  . mouth rinse  15 mL Mouth Rinse BID  . metoprolol tartrate   50 mg Oral BID  . pantoprazole  40 mg Oral Daily    Continuous Infusions:   PRN Meds: docusate sodium, fentaNYL (SUBLIMAZE) injection, LORazepam, ondansetron **OR** ondansetron (ZOFRAN) IV, sodium chloride  Physical Exam Vitals and nursing note reviewed.  Constitutional:      General: She is awake. She is not in acute distress.    Appearance: She is ill-appearing.     Comments: Appears fatigued  Cardiovascular:  Heart sounds: Normal heart sounds.  Pulmonary:     Effort: No accessory muscle usage, respiratory distress or retractions.     Breath sounds: Decreased breath sounds present.     Comments: Chest tubes remain in place with blood-tinged drainage Neurological:     Mental Status: She is alert and oriented to person, place, and time.             Vital Signs: BP (!) 103/58 (BP Location: Left Arm)   Pulse 82   Temp 98.1 F (36.7 C) (Oral)   Resp 15   Ht 5\' 8"  (1.727 m)   Wt 59.9 kg   SpO2 98%   BMI 20.08 kg/m  SpO2: SpO2: 98 % O2 Device: O2 Device: Nasal Cannula O2 Flow Rate: O2 Flow Rate (L/min): 3 L/min  Intake/output summary:   Intake/Output Summary (Last 24 hours) at 06/02/2021 1325 Last data filed at 06/02/2021 0900 Gross per 24 hour  Intake --  Output 200 ml  Net -200 ml   LBM: Last BM Date: 05/28/21 Baseline Weight: Weight: 61.2 kg Most recent weight: Weight: 59.9 kg       Palliative Assessment/Data: 50%      Patient Active Problem List   Diagnosis Date Noted  . Malnutrition of moderate degree 05/27/2021  . Hydropneumothorax   . Atrial fibrillation with RVR ([place]) 05/25/2021  . Closed fracture of multiple ribs with flail chest 05/11/2021  . Atherosclerosis of aorta ([place]) 03/30/2021  . Heart murmur 03/30/2021  . SOB (shortness of breath) 03/30/2021  . COPD (chronic obstructive pulmonary disease) ([place]) 09/09/2020  . Adjustment disorder, unspecified 01/11/2019  . Allergic rhinitis 06/18/2018  . Chronic fatigue 01/16/2018  . Peripheral  neuropathy due to chemotherapy ([place]) 08/03/2016  . Anxiety disorder 08/03/2016  . Lumbar back pain with radiculopathy affecting right lower extremity 08/03/2016  . Primary hypertension 04/14/2016  . Hypercholesteremia 04/14/2016  . Multinodular goiter 04/14/2016  . Overactive bladder 04/14/2016  . GERD (gastroesophageal reflux disease) 04/14/2016    Palliative Care Assessment & Plan   Patient Profile: Melanie Calk Ratliffeis a 80 y.o.femalewith medical history significant ofCOPD,multinodular goiter, essential hypertension, anxiety disorder, overactive bladder, A. fib with RVR,recent traumatic hemopneumothorax presented in the ED with bilateral leg swelling.  Patient was recently admitted to the [facility] trauma service from 05/11/21-5/26/22for right-sided rib fractures with hemopneumothorax following fall from a bed.  Now with moderate right hydropneumothorax with loculated effusions in the RLL and associated atelectasis of the RLL.  Assessment: Continues to be stable. Goals of care discussed yesterday and clear that she would like to try and get stronger. Family is worried about her decline.   Today she confirms that she would like her grandson Melanie [name]) to be her 59. Hardcopy paperwork on the chart but not yet signed.  Recommendations/Plan:  Complete HCPOA with Chaplain and notary  Continue to treat the treatable  Encourage adequate nutrition  Encourage out of bed  Goals of Care and Additional Recommendations:  Limitations on Scope of Treatment: DNR  Code Status:    Code Status Orders  (From admission, onward)         Start     Ordered   05/28/21 1355  Do not attempt resuscitation (DNR)  Continuous       Question Answer Comment  In the event of cardiac or respiratory ARREST Do not call a "code blue"   In the event of cardiac or respiratory ARREST Do not perform Intubation, CPR, defibrillation or ACLS   In the  event of cardiac or respiratory ARREST Use  medication by any route, position, wound care, and other measures to relive pain and suffering. May use oxygen, suction and manual treatment of airway obstruction as needed for comfort.      05/28/21 1354        Code Status History    Date Active Date Inactive Code Status Order ID Comments User Context   05/25/2021 1723 05/28/2021 1354 Full Code [id number]  Shawna Clamp, MD ED   05/11/2021 0251 05/21/2021 0009 Full Code [id number]  Stark Klein, MD ED   Advance Care Planning Activity       Prognosis:   Unable to determine  Discharge Planning:  To Be Determined  Care plan was discussed with Patient  Thank you for allowing the Palliative Medicine Team to assist in the care of this patient.   Time In: 9:15 am Time Out: 9:45 am Total Time 30 Prolonged Time Billed  no       Greater than 50%  of this time was spent counseling and coordinating care related to the above assessment and plan.  Walden Field, NP Loistine Chance, MD  Please contact Palliative Medicine Team phone at [phone] for questions and concerns.

## 2021-06-02 NOTE — TOC Progression Note (Addendum)
Transition of Care [facility]) - Progression Note    Patient Details  Name: Melanie [name] MRN: [id number] Date of Birth: Apr 17, 1941  Transition of Care [facility]) CM/SW [place], [place] Phone Number: 06/02/2021, 2:30 PM  Clinical Narrative:     CSW spoke with patient at bedside and provided SNF bed offers. CSW provided patient with medicare compare list. Patient wants to look over bed offers with family. CSW will follow up with patient tomorrow with SNF choice. PASSAR number has been approved. CSW will need to start insurance authorization close to patient being medically ready.   Expected Discharge Plan: [place] Barriers to Discharge: Continued Medical Work up  Expected Discharge Plan and Services Expected Discharge Plan: [place] In-house Referral: Clinical Social Work     Living arrangements for the past 2 months: Single Family Home                                       Social Determinants of Health (SDOH) Interventions    Readmission Risk Interventions Readmission Risk Prevention Plan 05/20/2021 05/20/2021  Post Dischage Appt Complete Complete  Medication Screening Complete Complete  Transportation Screening Complete Complete  Some recent data might be hidden

## 2021-06-02 NOTE — Progress Notes (Signed)
This chaplain replied to the Pt. HCPOA-Joe Mason questions through [facility].   The chaplain shared the notary's authority covers the state of [place].  Also, the hospital does not provide supportive services for creating a last will and testament.

## 2021-06-03 LAB — BASIC METABOLIC PANEL
Anion gap: 4 — ABNORMAL LOW (ref 5–15)
BUN: 30 mg/dL — ABNORMAL HIGH (ref 8–23)
CO2: 47 mmol/L — ABNORMAL HIGH (ref 22–32)
Calcium: 9.8 mg/dL (ref 8.9–10.3)
Chloride: 95 mmol/L — ABNORMAL LOW (ref 98–111)
Creatinine, Ser: 0.65 mg/dL (ref 0.44–1.00)
GFR, Estimated: >60 mL/min (ref >60)
Glucose, Bld: 123 mg/dL — ABNORMAL HIGH (ref 70–99)
Potassium: 4.3 mmol/L (ref 3.5–5.1)
Sodium: 146 mmol/L — ABNORMAL HIGH (ref 135–145)

## 2021-06-03 LAB — CBC
HCT: 34.2 % — ABNORMAL LOW (ref 36.0–46.0)
Hemoglobin: 10.1 g/dL — ABNORMAL LOW (ref 12.0–15.0)
MCH: 30.1 pg (ref 26.0–34.0)
MCHC: 29.5 g/dL — ABNORMAL LOW (ref 30.0–36.0)
MCV: 102.1 fL — ABNORMAL HIGH (ref 80.0–100.0)
Platelets: 210 10*3/uL (ref 150–400)
RBC: 3.35 MIL/uL — ABNORMAL LOW (ref 3.87–5.11)
RDW: 13.2 % (ref 11.5–15.5)
WBC: 6.6 10*3/uL (ref 4.0–10.5)
nRBC: 0 % (ref 0.0–0.2)

## 2021-06-03 NOTE — Progress Notes (Signed)
PROGRESS NOTE    Melanie [name]  GUY:[id number] DOB: 04-26-1941 DOA: 05/25/2021 PCP: [place], Betty G, MD    Brief Narrative:  Melanie [name] is a 80 year old female with past medical history significant for COPD, multinodular goiter, essential hypertension, anxiety disorder, overactive bladder, paroxysmal atrial fibrillation, recent traumatic hemopneumothorax who presented to the ED with bilateral lower extremity edema.  Also her oxygen requirement has increased to 4 L/min with CT scan in the ED showing no evidence of pulmonary embolism but with moderate right hydropneumothorax with loculated effusion right lower lobe with associated atelectasis.  Patient was recently admitted to [facility] trauma service from 05/11/2021 through 05/20/2021 following fall out of bed with subsequent right-sided rib fractures with hemopneumothorax.  During that hospitalization, patient was intubated, underwent IR thoracentesis, right pigtail drain placement which was removed prior to her discharge.  Patient also developed A. fib with RVR and started on metoprolol and Cardizem per cardiology, Dr. Einar Gip with outpatient follow-up recommended.  Patient was discharged on 3 L nasal cannula.  Assessment & Plan:   Principal Problem:   Atrial fibrillation with RVR (HCC) Active Problems:   Primary hypertension   Hypercholesteremia   Multinodular goiter   Overactive bladder   GERD (gastroesophageal reflux disease)   Peripheral neuropathy due to chemotherapy (HCC)   Anxiety disorder   Closed fracture of multiple ribs with flail chest   Hydropneumothorax   Malnutrition of moderate degree   Atrial fibrillation with RVR TTE 05/18/2021 limited by pain due to flail chest and hydropneumothorax but with grossly preserved LVEF. --Cardizem 60 mg p.o. 4 times daily --Eliquis for anticoagulation --Continue monitor on telemetry  Right leg DVT, recently diagnosed --Eliquis   Right-sided hydropneumothorax with loculated  effusion, recurrent Multiple right-sided rib fractures During previous hospitalization, patient had pigtail catheter placed which was removed prior to discharge.  Patient's O2 requirements have increased since discharge and CT chest notable for moderate right hemopneumothorax with associated loculated effusion/atelectasis. --s/p chest tube 6/1 by IR, removed 6/8 --s/p right apical chest tube by IR 06/01/21 --Tylenol 1000 mg p.o. every 8 hours scheduled --Oxycodone 2.5 mg p.o. every 6 hours as needed breakthrough pain --Monitor apical chest tube output closely --Repeat chest x-ray yesterday evening shows right pneumothorax is diminished in size. --IR for continued chest tube management  Essential hypertension BP 110/46 this morning --Losartan 100 mg p.o. daily --Metoprolol tartrate 50 mg p.o. twice daily --Cardizem 60 mg p.o. every 6 hours --Continue monitor BP closely  GERD --Protonix 40 mg p.o. daily  Anxiety --Lorazepam 0.5 mg p.o. every 6 hours as needed anxiety  Moderate protein calorie malnutrition Body mass index is 20.08 kg/m. Nutrition Status: Nutrition Problem: Moderate Malnutrition Etiology: chronic illness (COPD) Signs/Symptoms: moderate fat depletion, mild muscle depletion, severe muscle depletion Interventions: Ensure Enlive (each supplement provides 350kcal and 20 grams of protein), MVI --Dietitian following --Liberalize diet to regular --Continue to encourage increased oral intake, supplement use  Adult failure to thrive Patient is very thin and cachectic in appearance.  Family report poor oral intake for some time.  Patient with moderate protein calorie malnutrition.  Patient with recent fall from bed sustaining multiple right-sided rib fractures with associated hemopneumothorax as above.  Family meeting with palliative care was performed on 06/01/2021. --Palliative care following for goals of care and medical decision making; anticipate outpatient palliative care to  follow     DVT prophylaxis: Place and maintain sequential compression device Start: 05/25/21 1725 apixaban (ELIQUIS) tablet 10 mg  apixaban (ELIQUIS) tablet  5 mg     Code Status: DNR Family Communication: No family members present at bedside this morning, updated extensively to multiple family merged yesterday during palliative care meeting.  Disposition Plan:  Level of care: Progressive Status is: Inpatient  Remains inpatient appropriate because:Ongoing active pain requiring inpatient pain management, Ongoing diagnostic testing needed not appropriate for outpatient work up, Unsafe d/c plan, IV treatments appropriate due to intensity of illness or inability to take PO and Inpatient level of care appropriate due to severity of illness   Dispo: The patient is from: Home              Anticipated d/c is to: SNF              Patient currently is not medically stable to d/c.   Difficult to place patient No   Consultants:  Interventional radiology Palliative care  Procedures:  10 F right-sided chest tube, IR, Dr. Laurence Ferrari; 6/1; removed 6/8 90F right apical chest tube, IR, Dr. Earleen Newport; 6/7  Antimicrobials:  none     Subjective: Patient seen examined bedside, resting comfortably.  Dyspnea stable, pain controlled.  IR removed one of the chest tubes yesterday, apical chest tube remains.  Repeat x-ray yesterday afternoon shows pneumothorax diminished in size.  No family present at bedside this morning.  Continues with apical chest tube in place.  No other questions or concerns at this time.  Patient denies headache, no dizziness, no chest pain, no fever/chills/night sweats, no nausea/vomiting/diarrhea, no abdominal pain.  No acute concerns overnight per nursing staff.  Objective: Vitals:   06/02/21 2054 06/02/21 2137 06/03/21 0430 06/03/21 0739  BP: (!) 146/54  113/68 (!) 110/46  Pulse: (!) 109 (!) 119 (!) 105 88  Resp: 19  18 18   Temp: 98.3 F (36.8 C)  97.9 F (36.6 C) 98.7 F  (37.1 C)  TempSrc: Oral  Oral Axillary  SpO2: 100%  98% 100%  Weight:      Height:        Intake/Output Summary (Last 24 hours) at 06/03/2021 1114 Last data filed at 06/03/2021 0951 Gross per 24 hour  Intake 240 ml  Output 450 ml  Net -210 ml   Filed Weights   05/25/21 1745 05/26/21 1200  Weight: 61.2 kg 59.9 kg    Examination:  General exam: Appears calm and comfortable, thin/cachectic/chronically ill in appearance Respiratory system: Breath sounds decreased right base, no wheezing/crackles, normal respiratory effort, on 3 L nasal cannula with SPO2 100% at rest.  Apical chest tube noted on suction. Cardiovascular system: S1 & S2 heard, RRR. No JVD, murmurs, rubs, gallops or clicks. No pedal edema. Gastrointestinal system: Abdomen is nondistended, soft and nontender. No organomegaly or masses felt. Normal bowel sounds heard. Central nervous system: Alert and oriented. No focal neurological deficits. Extremities: Symmetric 5 x 5 power. Skin: No rashes, lesions or ulcers Psychiatry: Judgement and insight appear normal. Mood & affect appropriate.     Data Reviewed: I have personally reviewed following labs and imaging studies  CBC: Recent Labs  Lab 05/28/21 0347 05/29/21 0241 05/30/21 0305 06/02/21 0314 06/03/21 0349  WBC 8.0 8.1 6.7 7.9 6.6  HGB 10.2* 10.1* 9.7* 10.4* 10.1*  HCT 32.1* 32.5* 31.6* 36.0 34.2*  MCV 96.4 97.6 96.9 102.6* 102.1*  PLT 339 332 272 256 093   Basic Metabolic Panel: Recent Labs  Lab 05/30/21 0305 06/02/21 0314 06/03/21 0349  NA 143 144 146*  K 4.2 4.4 4.3  CL 99 95* 95*  CO2  36* 44* 47*  GLUCOSE 113* 142* 123*  BUN 24* 32* 30*  CREATININE 0.60 0.57 0.65  CALCIUM 9.6 10.0 9.8  MG  --  2.1  --    GFR: Estimated Creatinine Clearance: 52.2 mL/min (by C-G formula based on SCr of 0.65 mg/dL). Liver Function Tests: No results for input(s): AST, ALT, ALKPHOS, BILITOT, PROT, ALBUMIN in the last 168 hours. No results for input(s): LIPASE,  AMYLASE in the last 168 hours. No results for input(s): AMMONIA in the last 168 hours. Coagulation Profile: No results for input(s): INR, PROTIME in the last 168 hours. Cardiac Enzymes: No results for input(s): CKTOTAL, CKMB, CKMBINDEX, TROPONINI in the last 168 hours. BNP (last 3 results) Recent Labs    03/30/21 1301  PROBNP 97.0   HbA1C: No results for input(s): HGBA1C in the last 72 hours. CBG: No results for input(s): GLUCAP in the last 168 hours. Lipid Profile: No results for input(s): CHOL, HDL, LDLCALC, TRIG, CHOLHDL, LDLDIRECT in the last 72 hours. Thyroid Function Tests: No results for input(s): TSH, T4TOTAL, FREET4, T3FREE, THYROIDAB in the last 72 hours. Anemia Panel: No results for input(s): VITAMINB12, FOLATE, FERRITIN, TIBC, IRON, RETICCTPCT in the last 72 hours. Sepsis Labs: No results for input(s): PROCALCITON, LATICACIDVEN in the last 168 hours.  Recent Results (from the past 240 hour(s))  Resp Panel by RT-PCR (Flu A&B, Covid) Nasopharyngeal Swab     Status: None   Collection Time: 05/25/21  1:21 PM   Specimen: Nasopharyngeal Swab; Nasopharyngeal(NP) swabs in vial transport medium  Result Value Ref Range Status   SARS Coronavirus 2 by RT PCR NEGATIVE NEGATIVE Final    Comment: (NOTE) SARS-CoV-2 target nucleic acids are NOT DETECTED.  The SARS-CoV-2 RNA is generally detectable in upper respiratory specimens during the acute phase of infection. The lowest concentration of SARS-CoV-2 viral copies this assay can detect is 138 copies/mL. A negative result does not preclude SARS-Cov-2 infection and should not be used as the sole basis for treatment or other patient management decisions. A negative result may occur with  improper specimen collection/handling, submission of specimen other than nasopharyngeal swab, presence of viral mutation(s) within the areas targeted by this assay, and inadequate number of viral copies(<138 copies/mL). A negative result must be  combined with clinical observations, patient history, and epidemiological information. The expected result is Negative.  Fact Sheet for Patients:  EntrepreneurPulse.com.au  Fact Sheet for Healthcare Providers:  IncredibleEmployment.be  This test is no t yet approved or cleared by the [place] FDA and  has been authorized for detection and/or diagnosis of SARS-CoV-2 by FDA under an Emergency Use Authorization (EUA). This EUA will remain  in effect (meaning this test can be used) for the duration of the COVID-19 declaration under Section 564(b)(1) of the Act, 21 U.S.C.section 360bbb-3(b)(1), unless the authorization is terminated  or revoked sooner.       Influenza A by PCR NEGATIVE NEGATIVE Final   Influenza B by PCR NEGATIVE NEGATIVE Final    Comment: (NOTE) The Xpert Xpress SARS-CoV-2/FLU/RSV plus assay is intended as an aid in the diagnosis of influenza from Nasopharyngeal swab specimens and should not be used as a sole basis for treatment. Nasal washings and aspirates are unacceptable for Xpert Xpress SARS-CoV-2/FLU/RSV testing.  Fact Sheet for Patients: EntrepreneurPulse.com.au  Fact Sheet for Healthcare Providers: IncredibleEmployment.be  This test is not yet approved or cleared by the [place] FDA and has been authorized for detection and/or diagnosis of SARS-CoV-2 by FDA under an Emergency Use  Authorization (EUA). This EUA will remain in effect (meaning this test can be used) for the duration of the COVID-19 declaration under Section 564(b)(1) of the Act, 21 U.S.C. section 360bbb-3(b)(1), unless the authorization is terminated or revoked.  Performed at [facility], [place] [address] [place], [place] [address]          Radiology Studies: [facility] Chest Port 1 View  Result Date: 06/02/2021 CLINICAL DATA:  Follow-up pneumothorax. EXAM: PORTABLE CHEST 1 VIEW COMPARISON:   Radiograph earlier today.  CT 05/25/2021 FINDINGS: Right pigtail catheter coursing near the apex. Previous pigtail catheter at the right lung base is not definitively visualized. Previous pneumothorax has diminished without residual visualized. Suspected small pleural effusion at the right lung base. Stable heart size and mediastinal contours with aortic atherosclerosis. Similar left basilar atelectasis. Right rib fractures again seen. IMPRESSION: 1. Upper right pigtail catheter remain in place. Previous pigtail catheter at the right lung base is no longer visualized. Right pneumothorax has diminished in size from prior exam, without residual. 2. Suspected small right pleural effusion. Similar left basilar atelectasis. Electronically Signed   By: Keith Rake M.D.   On: 06/02/2021 19:29   DG CHEST PORT 1 VIEW  Result Date: 06/02/2021 CLINICAL DATA:  Shortness of breath.  Chest tubes. EXAM: PORTABLE CHEST 1 VIEW COMPARISON:  06/01/2021.  05/31/2021.  CT 05/25/2021. FINDINGS: Two right chest tubes are noted over the right chest. Right lower chest tube side holes may be within the right chest wall. Persistent right apical pneumothorax noted. Atelectatic changes in the right lung again noted. Mild atelectatic changes left lung base. Stable cardiomegaly. Multiple right rib fractures again noted. Degenerative changes thoracic spine. IMPRESSION: 1. Two right chest tubes are noted over the right chest. Right lower chest tube side holes may be within the right chest wall. Persistent right apical pneumothorax noted. 2. Atelectatic changes in the right lung again noted. Mild atelectatic changes left lung base. 3.  Stable cardiomegaly. 4.  Multiple right rib fractures again noted. Electronically Signed   By: Marcello Moores  Register   On: 06/02/2021 06:46         Scheduled Meds:  acetaminophen  1,000 mg Oral Q8H   apixaban  10 mg Oral BID   Followed by   Derrill Memo ON 06/06/2021] apixaban  5 mg Oral BID   diltiazem  60  mg Oral Q6H   feeding supplement  237 mL Oral TID BM   loratadine  10 mg Oral Daily   losartan  100 mg Oral Daily   mouth rinse  15 mL Mouth Rinse BID   metoprolol tartrate  50 mg Oral BID   pantoprazole  40 mg Oral Daily   Continuous Infusions:   LOS: 9 days    Time spent: 38 minutes spent on chart review, discussion with nursing staff, consultants, updating family and interview/physical exam; more than 50% of that time was spent in counseling and/or coordination of care.    Melanie Sarrazin J [place] DO [facility] Available via Epic secure chat 7am-7pm After these hours, please refer to coverage provider listed on [email or web address] 06/03/2021, 11:14 AM

## 2021-06-03 NOTE — TOC Progression Note (Addendum)
Transition of Care [facility]) - Progression Note    Patient Details  Name: Melanie [name] MRN: [id number] Date of Birth: 1941/09/16  Transition of Care [facility] CM/SW [place], [place] Phone Number: 06/03/2021, 10:59 AM  Clinical Narrative:     Update- CSW spoke with patients Melanie [name] and provided SNF bed offers. Patients Grandson would like to review bed offers and will call CSW back tomorrow with SNF choice. Patients Grandson requested for MD to call with medical update. CSW informed MD. Patients Grandson had some billing questions for patient. CSW provided patients Grandson number for billing to help assist with billing questions.CSW will start insurance authorization close to patient being medically ready for dc.  CSW spoke with patient at bedside. Patient confirmed she would like CSW to call Grandson to get SNF choice. CSW called patients Melanie [name]. Joe request for CSW to call him back around noon to discuss SNF bed offers. CSW will call patients Grandson back at noon. CSW will continue to follow and assist with discharge planning needs.  Expected Discharge Plan: [place] Barriers to Discharge: Continued Medical Work up  Expected Discharge Plan and Services Expected Discharge Plan: [place] In-house Referral: Clinical Social Work     Living arrangements for the past 2 months: Single Family Home                                       Social Determinants of Health (SDOH) Interventions    Readmission Risk Interventions Readmission Risk Prevention Plan 05/20/2021 05/20/2021  Post Dischage Appt Complete Complete  Medication Screening Complete Complete  Transportation Screening Complete Complete  Some recent data might be hidden

## 2021-06-03 NOTE — Progress Notes (Signed)
Mobility Specialist - Progress Note   06/03/21 1317  Mobility  Activity Transferred to/from [facility];Transferred:  Bed to chair  Level of Assistance Minimal assist, patient does 75% or more  Assistive Device [facility]  Mobility Out of bed for toileting;Out of bed to chair with meals  Mobility Response Tolerated well  Mobility performed by Mobility specialist  $Mobility charge 1 Mobility   Pt transferred from bed to [facility] w/ handheld assist. No assist for pericare after passing a loose stool. Pt then handheld assist to pivot from [facility] to recliner. Pt left in recliner w/ daughter in room and call bell at side. Daughter assisting pt to eat lunch. VSS.  Pricilla Handler Mobility Specialist Mobility Specialist Phone: [phone]

## 2021-06-03 NOTE — Progress Notes (Signed)
Referring Physician(s): Dr. E [place]  Supervising Physician: Aletta Edouard  Patient Status:  [facility] - In-pt  Chief Complaint:  S/p chest tube placement on 6/1, removed on 6/8 S/p 2nd chest tube placement on 6/7   Subjective:  Patient sitting in bed, not in acute distress.  States tired, denies chest pain or shortness of breath.   Allergies: Latex, Macrodantin, Guaifenesin er, Morphine and related, Ditropan [oxybutynin], Lipitor [atorvastatin], Lisinopril, Magnesium-containing compounds, Pravastatin, and Zoloft [sertraline hcl]  Medications: Prior to Admission medications   Medication Sig Start Date End Date Taking? Authorizing Provider  acetaminophen (TYLENOL) 650 MG CR tablet Take 650 mg by mouth every 8 (eight) hours as needed for fever or pain.   Yes [provider]  CALCIUM-VITAMIN D PO Take 1 tablet by mouth daily.   Yes [provider]  cetirizine (ZYRTEC) 10 MG tablet Take 0.5 tablets (5 mg total) by mouth daily. Patient taking differently: Take 10 mg by mouth daily. 09/09/20  Yes [place], Betty G, MD  diltiazem (CARDIZEM) 30 MG tablet Take 1 tablet (30 mg total) by mouth every 6 (six) hours. 05/20/21  Yes Maczis, Barth Kirks, PA-C  docusate sodium (COLACE) 100 MG capsule Take 1 capsule (100 mg total) by mouth 2 (two) times daily as needed for mild constipation. 05/20/21  Yes Maczis, Barth Kirks, PA-C  fluticasone (FLONASE) 50 MCG/ACT nasal spray Place 1 spray into both nostrils 2 (two) times daily. Patient taking differently: Place 1 spray into both nostrils 2 (two) times daily as needed for allergies. 11/05/19  Yes [place], Betty G, MD  hydrocortisone cream 1 % Apply 1 application topically 4 (four) times daily as needed for itching.   Yes [provider]  LORazepam (ATIVAN) 0.5 MG tablet Take 0.5 mg by mouth 2 (two) times daily as needed for anxiety. 04/30/21  Yes [provider]  metoprolol tartrate (LOPRESSOR) 50 MG tablet Take 1 tablet (50  mg total) by mouth 2 (two) times daily. 05/20/21  Yes Maczis, Barth Kirks, PA-C  oxyCODONE (OXY IR/ROXICODONE) 5 MG immediate release tablet Take 1 tablet (5 mg total) by mouth every 6 (six) hours as needed for breakthrough pain. 05/20/21  Yes Ileana Roup, MD  pantoprazole (PROTONIX) 20 MG tablet Take 20 mg by mouth daily. 05/10/21  Yes [provider]  polyvinyl alcohol (LIQUIFILM TEARS) 1.4 % ophthalmic solution Place 1 drop into both eyes as needed for dry eyes.   Yes [provider]  VITAMIN D PO Take 1 capsule by mouth daily.   Yes [provider]  acetaminophen (TYLENOL) 500 MG tablet Take 2 tablets (1,000 mg total) by mouth every 8 (eight) hours. Patient not taking: Reported on 05/25/2021 05/20/21   Jillyn Ledger, PA-C  LORazepam (ATIVAN) 0.5 MG tablet Take 0.5-1 tablets (0.25-0.5 mg total) by mouth 2 (two) times daily as needed. for anxiety Patient not taking: Reported on 05/25/2021 03/30/21   [place], Betty G, MD  losartan (COZAAR) 100 MG tablet TAKE 1 TABLET BY MOUTH EVERY DAY Patient not taking: Reported on 05/25/2021 03/22/21   [place], Betty G, MD  pantoprazole (PROTONIX) 20 MG tablet TAKE 1 TABLET BY MOUTH 30 MINUTES BEFORE BREAKFAST. STOP OMEPRAZOLE Patient not taking: Reported on 05/25/2021 05/10/21   [place], Betty G, MD  polyethylene glycol (MIRALAX / GLYCOLAX) 17 g packet Take 17 g by mouth daily as needed. Patient not taking: Reported on 05/25/2021 05/20/21   Jillyn Ledger, PA-C     Vital Signs: BP Marland Kitchen)  103/57 (BP Location: Left Arm)   Pulse 74   Temp 97.9 F (36.6 C) (Oral)   Resp 17   Ht 5\' 8"  (1.727 m)   Wt 132 lb 0.9 oz (59.9 kg)   SpO2 100%   BMI 20.08 kg/m   Physical Exam Vitals reviewed.  Constitutional:      General: She is not in acute distress.    Appearance: She is ill-appearing.  HENT:     Head: Normocephalic and atraumatic.  Cardiovascular:     Rate and Rhythm: Normal rate.  Pulmonary:     Effort: Pulmonary effort is  normal.     Breath sounds: Normal breath sounds.     Comments: Positive right upper chest tube to Pleurovac.  Site is unremarkable with no erythema, edema, tenderness, bleeding or drainage. Suture and stat lock in place. Dressing is clean, dry, and intact. 0 ml of fluid noted in the Pleurovac. Negative for air leak.  Skin:    General: Skin is warm and dry.     Coloration: Skin is not pale.  Neurological:     Mental Status: She is oriented to person, place, and time.  Psychiatric:        Mood and Affect: Mood normal.        Behavior: Behavior normal.        Judgment: Judgment normal.    Imaging: DG Chest Port 1 View  Result Date: 06/02/2021 CLINICAL DATA:  Follow-up pneumothorax. EXAM: PORTABLE CHEST 1 VIEW COMPARISON:  Radiograph earlier today.  CT 05/25/2021 FINDINGS: Right pigtail catheter coursing near the apex. Previous pigtail catheter at the right lung base is not definitively visualized. Previous pneumothorax has diminished without residual visualized. Suspected small pleural effusion at the right lung base. Stable heart size and mediastinal contours with aortic atherosclerosis. Similar left basilar atelectasis. Right rib fractures again seen. IMPRESSION: 1. Upper right pigtail catheter remain in place. Previous pigtail catheter at the right lung base is no longer visualized. Right pneumothorax has diminished in size from prior exam, without residual. 2. Suspected small right pleural effusion. Similar left basilar atelectasis. Electronically Signed   By: Keith Rake M.D.   On: 06/02/2021 19:29   DG CHEST PORT 1 VIEW  Result Date: 06/02/2021 CLINICAL DATA:  Shortness of breath.  Chest tubes. EXAM: PORTABLE CHEST 1 VIEW COMPARISON:  06/01/2021.  05/31/2021.  CT 05/25/2021. FINDINGS: Two right chest tubes are noted over the right chest. Right lower chest tube side holes may be within the right chest wall. Persistent right apical pneumothorax noted. Atelectatic changes in the right lung  again noted. Mild atelectatic changes left lung base. Stable cardiomegaly. Multiple right rib fractures again noted. Degenerative changes thoracic spine. IMPRESSION: 1. Two right chest tubes are noted over the right chest. Right lower chest tube side holes may be within the right chest wall. Persistent right apical pneumothorax noted. 2. Atelectatic changes in the right lung again noted. Mild atelectatic changes left lung base. 3.  Stable cardiomegaly. 4.  Multiple right rib fractures again noted. Electronically Signed   By: Marcello Moores  Register   On: 06/02/2021 06:46   DG Chest Port 1 View  Result Date: 05/31/2021 CLINICAL DATA:  Chest tube in place. EXAM: PORTABLE CHEST 1 VIEW COMPARISON:  May 30, 2021. FINDINGS: The heart size and mediastinal contours are within normal limits. Right-sided chest tube is again noted. Mild right apical pneumothorax is noted. Mild bibasilar subsegmental atelectasis is noted. Multiple displaced right rib fractures are again noted. IMPRESSION: Right-sided  chest tube is again noted. Mild right apical pneumothorax is now noted. Multiple displaced right rib fractures are again noted. Aortic Atherosclerosis (ICD10-I70.0). Electronically Signed   By: Marijo Conception M.D.   On: 05/31/2021 08:21   CT [facility] PLEURAL DRAIN W/INDWELL CATH W/IMG GUIDE  Result Date: 06/01/2021 INDICATION: 80 year old female with persisting/recurrent right apical pneumothorax after prior chest tube EXAM: CT-GUIDED THORACOSTOMY TUBE MEDICATIONS: None ANESTHESIA/SEDATION: Fentanyl 25 mcg IV; Versed 0 mg IV Moderate Sedation Time:  0 The patient was continuously monitored during the procedure by the interventional radiology nurse under my direct supervision. COMPLICATIONS: None PROCEDURE: Informed written consent was obtained from the patient after a thorough discussion of the procedural risks, benefits and alternatives. All questions were addressed. Maximal Sterile Barrier Technique was utilized including caps, mask,  sterile gowns, sterile gloves, sterile drape, hand hygiene and skin antiseptic. A timeout was performed prior to the initiation of the procedure. Patient was positioned supine position on CT gantry table. Scout CT was acquired for planning purposes. The patient was then prepped and draped in the usual sterile fashion. 1% lidocaine was used for local anesthesia. Small stab incision was made with 11 blade scalpel along the anterior upper right chest wall. Yueh needle was then used to enter the pleural space with aspiration of air. The plastic catheter was advanced and the needle was removed. A wire was placed into the pleural space. Modified Seldinger technique was then used to place a 8.5 French pigtail drainage catheter. Catheter was attached to the evac chamber and we confirmed aspiration of air. Final CT was acquired. Catheter was sutured in position. Patient tolerated the procedure well and remained hemodynamically stable throughout. No complications were encountered and no significant blood loss. IMPRESSION: Status post CT-guided placement of right apical thoracostomy tube. Signed, Dulcy Fanny. Dellia Nims, RPVI [facility] Electronically Signed   By: Corrie Mckusick D.O.   On: 06/01/2021 13:16    Labs:  CBC: Recent Labs    05/29/21 0241 05/30/21 0305 06/02/21 0314 06/03/21 0349  WBC 8.1 6.7 7.9 6.6  HGB 10.1* 9.7* 10.4* 10.1*  HCT 32.5* 31.6* 36.0 34.2*  PLT 332 272 256 210    COAGS: Recent Labs    05/11/21 0035  INR 1.0    BMP: Recent Labs    09/09/20 1222 03/30/21 1301 05/26/21 0330 05/30/21 0305 06/02/21 0314 06/03/21 0349  NA 136   < > 134* 143 144 146*  K 4.0   < > 4.7 4.2 4.4 4.3  CL 99   < > 90* 99 95* 95*  CO2 32   < > 34* 36* 44* 47*  GLUCOSE 88   < > 122* 113* 142* 123*  BUN 18   < > 19 24* 32* 30*  CALCIUM 9.9   < > 9.5 9.6 10.0 9.8  CREATININE 0.87   < > 0.66 0.60 0.57 0.65  GFRNONAA 62   < > >60 >60 >60 >60   GFRAA 72  --   --   --   --   --    < > = values in this interval not displayed.    LIVER FUNCTION TESTS: Recent Labs    03/30/21 1301 05/11/21 0035 05/25/21 1358 05/26/21 0330  BILITOT 0.5 0.5 0.6 0.4  AST 18 25 26 20   ALT 10 13 14 19   ALKPHOS 52 54 53 52  PROT 7.3 6.7 6.5 6.4*  ALBUMIN 4.4 3.6 3.2* 3.1*    Assessment  and Plan:  Post-traumatic right hydropneumothorax s/p right chest tube placement in IR 05/26/2021. Follow up imaging on 6/6/ showed a mild right apical PTX, and patient underwent another cheat tube placement on 06/01/21.  The right inferior/lateral chest tube was intact and stable till 6/8, when it appeared defected. Discussed with Dr. Serafina Royals, the chest tube was removed at bedside on 6/8.   Right upper chest tube intact, attached to suction.  Site unremarkable.  0 output x 2 days.   Discussed with Dr. Kathlene Cote, the chest tube to be water sealed.  Informed RN that patient is going thorough water seal trial, the CT not to be put back on suction.   Continue to document OP q shift.   Further treatment plan per Trauma/ TRH/  Appreciate and agree with the plan.  IR to follow.    Electronically Signed: Tera Mater, PA-C 06/03/2021, 1:32 PM   I spent a total of 25 Minutes at the the patient's bedside AND on the patient's hospital floor or unit, greater than 50% of which was counseling/coordinating care for right chest tube

## 2021-06-03 NOTE — Plan of Care (Signed)

## 2021-06-03 NOTE — Care Management Important Message (Signed)
Important Message  Patient Details  Name: Melanie [name] MRN: [id number] Date of Birth: 1941/02/08   Medicare Important Message Given:  Yes     Melanie [name] 06/03/2021, 12:27 PM

## 2021-06-04 ENCOUNTER — Inpatient Hospital Stay (HOSPITAL_COMMUNITY): Payer: Medicare Other

## 2021-06-04 MED ORDER — DILTIAZEM HCL ER COATED BEADS 240 MG PO CP24
240.0000 mg | ORAL_CAPSULE | Freq: Every day | ORAL | Status: DC
  Filled 2021-06-04: qty 1

## 2021-06-04 NOTE — Progress Notes (Signed)
Home [facility]) Hospital Liaison Note  Notified by Britt Boozer of patient/family request for [facility] Palliative services at home after discharge.   [facility] liaison will follow patient for discharge disposition.   Please call with any Hospice/Palliative related questions or concerns.   Thank you for the opportunity to participate in this patient's care  Jhonnie Garner RN, BSN, [facility] [phone]

## 2021-06-04 NOTE — TOC Progression Note (Addendum)
Transition of Care [facility]) - Progression Note    Patient Details  Name: Melanie [name] MRN: [id number] Date of Birth: 03/14/1941  Transition of Care [facility]) CM/SW [place], [place] Phone Number: 06/04/2021, 11:17 AM  Clinical Narrative:     Update-CSW received call from Mardene Celeste with [facility] who confirmed they cannot offer a bed for patient at this time. CSW informed patients Grandson. Patients Grandson chose SNF placement for patient at [facility] [address] [facility] CSW called Bryson Ha with [facility] to confirm bed offer. Bryson Ha with [facility] confirmed she can accept patient for SNF placement. CSW informed patients grandson [name].  Update- CSW received callback from [place] with [facility] who confirmed she will look at Referral to see if she can offer SNF bed for patient. CSW awaiting callback.  Update- CSW received callback from patients Milana Obey who requested for CSW to fax out initial referral for patient to [facility] SNF. CSW faxed over referral for patient. CSW called [facility] to check status on referral. CSW awaiting callback.CSW received consult for palliative to follow patient at SNF. Patents Grandson requested for CSW to make palliative referral to [place] for patient. CSW made referral to misty with [facility] for palliative services to follow patient at SNF.  CSW called patients Milana Obey regarding SNF choice. CSW awaiting callback.  Expected Discharge Plan: [place] Barriers to Discharge: Continued Medical Work up  Expected Discharge Plan and Services Expected Discharge Plan: [place] In-house Referral: Clinical Social Work     Living arrangements for the past 2 months: Single Family Home                                       Social Determinants of Health (SDOH) Interventions    Readmission Risk Interventions Readmission Risk Prevention Plan 05/20/2021 05/20/2021  Post  Dischage Appt Complete Complete  Medication Screening Complete Complete  Transportation Screening Complete Complete  Some recent data might be hidden

## 2021-06-04 NOTE — Progress Notes (Signed)
Mobility Specialist - Progress Note   06/04/21 1342  Mobility  Activity Transferred:  Bed to chair  Level of Assistance Minimal assist, patient does 75% or more  Assistive Device Other (Comment) (Handheld assist)  Distance Ambulated (ft) 4 ft  Mobility Ambulated with assistance in room  Mobility Response Tolerated well  Mobility performed by Mobility specialist  $Mobility charge 1 Mobility   Pt initially declining OOB mobility, but agreed to pivot to recliner once sitting up on edge of bed. She was min assist to stand from bedside and pivot to recliner. Pt c/o fatigue after mobility, but otherwise asx. Pt left in recliner, call bell at side and chair alarm on. VSS throughout.   Pricilla Handler Mobility Specialist Mobility Specialist Phone: [phone]

## 2021-06-04 NOTE — Progress Notes (Signed)
Referring Physician(s): Logan Bores  Supervising Physician: Jacqulynn Cadet  Patient Status:  [facility] - In-pt  Chief Complaint:  Right pneumothorax  Subjective: Pt drowsy but arousable; appears weak/frail; currently denies pain   Allergies: Latex, Macrodantin, Guaifenesin er, Morphine and related, Ditropan [oxybutynin], Lipitor [atorvastatin], Lisinopril, Magnesium-containing compounds, Pravastatin, and Zoloft [sertraline hcl]  Medications: Prior to Admission medications   Medication Sig Start Date End Date Taking? Authorizing Provider  acetaminophen (TYLENOL) 650 MG CR tablet Take 650 mg by mouth every 8 (eight) hours as needed for fever or pain.   Yes [provider]  CALCIUM-VITAMIN D PO Take 1 tablet by mouth daily.   Yes [provider]  cetirizine (ZYRTEC) 10 MG tablet Take 0.5 tablets (5 mg total) by mouth daily. Patient taking differently: Take 10 mg by mouth daily. 09/09/20  Yes Martinique, Betty G, MD  diltiazem (CARDIZEM) 30 MG tablet Take 1 tablet (30 mg total) by mouth every 6 (six) hours. 05/20/21  Yes Maczis, Barth Kirks, PA-C  docusate sodium (COLACE) 100 MG capsule Take 1 capsule (100 mg total) by mouth 2 (two) times daily as needed for mild constipation. 05/20/21  Yes Maczis, Barth Kirks, PA-C  fluticasone (FLONASE) 50 MCG/ACT nasal spray Place 1 spray into both nostrils 2 (two) times daily. Patient taking differently: Place 1 spray into both nostrils 2 (two) times daily as needed for allergies. 11/05/19  Yes [place], Betty G, MD  hydrocortisone cream 1 % Apply 1 application topically 4 (four) times daily as needed for itching.   Yes [provider]  LORazepam (ATIVAN) 0.5 MG tablet Take 0.5 mg by mouth 2 (two) times daily as needed for anxiety. 04/30/21  Yes [provider]  metoprolol tartrate (LOPRESSOR) 50 MG tablet Take 1 tablet (50 mg total) by mouth 2 (two) times daily. 05/20/21  Yes Maczis, Barth Kirks, PA-C  oxyCODONE (OXY IR/ROXICODONE)  5 MG immediate release tablet Take 1 tablet (5 mg total) by mouth every 6 (six) hours as needed for breakthrough pain. 05/20/21  Yes Ileana Roup, MD  pantoprazole (PROTONIX) 20 MG tablet Take 20 mg by mouth daily. 05/10/21  Yes [provider]  polyvinyl alcohol (LIQUIFILM TEARS) 1.4 % ophthalmic solution Place 1 drop into both eyes as needed for dry eyes.   Yes [provider]  VITAMIN D PO Take 1 capsule by mouth daily.   Yes [provider]  acetaminophen (TYLENOL) 500 MG tablet Take 2 tablets (1,000 mg total) by mouth every 8 (eight) hours. Patient not taking: Reported on 05/25/2021 05/20/21   Jillyn Ledger, PA-C  LORazepam (ATIVAN) 0.5 MG tablet Take 0.5-1 tablets (0.25-0.5 mg total) by mouth 2 (two) times daily as needed. for anxiety Patient not taking: Reported on 05/25/2021 03/30/21   Martinique, Betty G, MD  losartan (COZAAR) 100 MG tablet TAKE 1 TABLET BY MOUTH EVERY DAY Patient not taking: Reported on 05/25/2021 03/22/21   [place], Betty G, MD  pantoprazole (PROTONIX) 20 MG tablet TAKE 1 TABLET BY MOUTH 30 MINUTES BEFORE BREAKFAST. STOP OMEPRAZOLE Patient not taking: Reported on 05/25/2021 05/10/21   [place], Betty G, MD  polyethylene glycol (MIRALAX / GLYCOLAX) 17 g packet Take 17 g by mouth daily as needed. Patient not taking: Reported on 05/25/2021 05/20/21   Jillyn Ledger, PA-C     Vital Signs: BP (!) 101/49 (BP Location: Right Arm)   Pulse 70   Temp 97.8 F (36.6 C) (Axillary)   Resp 18   Ht 5\' 8"  (1.727  m)   Wt 132 lb 0.9 oz (59.9 kg)   SpO2 100%   BMI 20.08 kg/m   Physical Exam drowsy but arousable; rt upper any chest drain in place; on waterseal, no obvious air leak, minimal pleural fluid in pleuravac  Imaging: DG Chest Port 1 View  Result Date: 06/04/2021 CLINICAL DATA:  Chest tube EXAM: PORTABLE CHEST 1 VIEW COMPARISON:  Portable exam 1016 hours compared to 06/02/2021 FINDINGS: Pigtail RIGHT thoracostomy tube again identified. Normal  heart size, mediastinal contours, and pulmonary vascularity. Atherosclerotic calcification aorta. RIGHT pleural effusion without residual pneumothorax. Underlying emphysematous changes. Bones demineralized. IMPRESSION: RIGHT thoracostomy tube without pneumothorax. Emphysematous changes with small RIGHT pleural effusion. Aortic Atherosclerosis (ICD10-I70.0) and Emphysema (ICD10-J43.9). Electronically Signed   By: Lavonia Dana M.D.   On: 06/04/2021 12:37   DG Chest Port 1 View  Result Date: 06/02/2021 CLINICAL DATA:  Follow-up pneumothorax. EXAM: PORTABLE CHEST 1 VIEW COMPARISON:  Radiograph earlier today.  CT 05/25/2021 FINDINGS: Right pigtail catheter coursing near the apex. Previous pigtail catheter at the right lung base is not definitively visualized. Previous pneumothorax has diminished without residual visualized. Suspected small pleural effusion at the right lung base. Stable heart size and mediastinal contours with aortic atherosclerosis. Similar left basilar atelectasis. Right rib fractures again seen. IMPRESSION: 1. Upper right pigtail catheter remain in place. Previous pigtail catheter at the right lung base is no longer visualized. Right pneumothorax has diminished in size from prior exam, without residual. 2. Suspected small right pleural effusion. Similar left basilar atelectasis. Electronically Signed   By: Keith Rake M.D.   On: 06/02/2021 19:29   DG CHEST PORT 1 VIEW  Result Date: 06/02/2021 CLINICAL DATA:  Shortness of breath.  Chest tubes. EXAM: PORTABLE CHEST 1 VIEW COMPARISON:  06/01/2021.  05/31/2021.  CT 05/25/2021. FINDINGS: Two right chest tubes are noted over the right chest. Right lower chest tube side holes may be within the right chest wall. Persistent right apical pneumothorax noted. Atelectatic changes in the right lung again noted. Mild atelectatic changes left lung base. Stable cardiomegaly. Multiple right rib fractures again noted. Degenerative changes thoracic spine.  IMPRESSION: 1. Two right chest tubes are noted over the right chest. Right lower chest tube side holes may be within the right chest wall. Persistent right apical pneumothorax noted. 2. Atelectatic changes in the right lung again noted. Mild atelectatic changes left lung base. 3.  Stable cardiomegaly. 4.  Multiple right rib fractures again noted. Electronically Signed   By: Marcello Moores  Register   On: 06/02/2021 06:46   CT PERC PLEURAL DRAIN W/INDWELL CATH W/IMG GUIDE  Result Date: 06/01/2021 INDICATION: 80 year old female with persisting/recurrent right apical pneumothorax after prior chest tube EXAM: CT-GUIDED THORACOSTOMY TUBE MEDICATIONS: None ANESTHESIA/SEDATION: Fentanyl 25 mcg IV; Versed 0 mg IV Moderate Sedation Time:  0 The patient was continuously monitored during the procedure by the interventional radiology nurse under my direct supervision. COMPLICATIONS: None PROCEDURE: Informed written consent was obtained from the patient after a thorough discussion of the procedural risks, benefits and alternatives. All questions were addressed. Maximal Sterile Barrier Technique was utilized including caps, mask, sterile gowns, sterile gloves, sterile drape, hand hygiene and skin antiseptic. A timeout was performed prior to the initiation of the procedure. Patient was positioned supine position on CT gantry table. Scout CT was acquired for planning purposes. The patient was then prepped and draped in the usual sterile fashion. 1% lidocaine was used for local anesthesia. Small stab incision was made with 11 blade scalpel along  the anterior upper right chest wall. Yueh needle was then used to enter the pleural space with aspiration of air. The plastic catheter was advanced and the needle was removed. A wire was placed into the pleural space. Modified Seldinger technique was then used to place a 8.5 French pigtail drainage catheter. Catheter was attached to the evac chamber and we confirmed aspiration of air. Final CT  was acquired. Catheter was sutured in position. Patient tolerated the procedure well and remained hemodynamically stable throughout. No complications were encountered and no significant blood loss. IMPRESSION: Status post CT-guided placement of right apical thoracostomy tube. Signed, Dulcy Fanny. Dellia Nims, RPVI [facility] Electronically Signed   By: Corrie Mckusick D.O.   On: 06/01/2021 13:16    Labs:  CBC: Recent Labs    05/29/21 0241 05/30/21 0305 06/02/21 0314 06/03/21 0349  WBC 8.1 6.7 7.9 6.6  HGB 10.1* 9.7* 10.4* 10.1*  HCT 32.5* 31.6* 36.0 34.2*  PLT 332 272 256 210    COAGS: Recent Labs    05/11/21 0035  INR 1.0    BMP: Recent Labs    09/09/20 1222 03/30/21 1301 05/26/21 0330 05/30/21 0305 06/02/21 0314 06/03/21 0349  NA 136   < > 134* 143 144 146*  K 4.0   < > 4.7 4.2 4.4 4.3  CL 99   < > 90* 99 95* 95*  CO2 32   < > 34* 36* 44* 47*  GLUCOSE 88   < > 122* 113* 142* 123*  BUN 18   < > 19 24* 32* 30*  CALCIUM 9.9   < > 9.5 9.6 10.0 9.8  CREATININE 0.87   < > 0.66 0.60 0.57 0.65  GFRNONAA 62   < > >60 >60 >60 >60  GFRAA 72  --   --   --   --   --    < > = values in this interval not displayed.    LIVER FUNCTION TESTS: Recent Labs    03/30/21 1301 05/11/21 0035 05/25/21 1358 05/26/21 0330  BILITOT 0.5 0.5 0.6 0.4  AST 18 25 26 20   ALT 10 13 14 19   ALKPHOS 52 54 53 52  PROT 7.3 6.7 6.5 6.4*  ALBUMIN 4.4 3.6 3.2* 3.1*    Assessment and Plan: Pt with hx FTT,PCM, HTN, rt hydroptx, rib fx's, afib, RLE DVT; s/p rt lateral chest tube 6/1- removed 6/8; rt ant chest drain placement 6/7; CXR today(off waterseal)- no ptx; small rt effusion; case d/w Drs. McCullough/[place]; decision made to remove remaining chest drain; drain removed today in its entirety without immediate complications. Vaseline gauze applied to site; CXR ordered for am; nurse aware that if pt develops worsening chest pain/dyspnea to  obtain CXR sooner   Electronically Signed: D. Rowe Robert, PA-C 06/04/2021, 3:22 PM   I spent a total of 20 minutes at the the patient's bedside AND on the patient's hospital floor or unit, greater than 50% of which was counseling/coordinating care for right chest tube    Patient ID: Melanie [name], female   DOB: Jan 04, 1941, 80 y.o.   MRN: [id number]

## 2021-06-04 NOTE — Progress Notes (Signed)
PT Cancellation Note  Patient Details Name: Melanie [name] MRN: [id number] DOB: 06-15-1941   Cancelled Treatment:    Reason Eval/Treat Not Completed: Patient's level of consciousness. Pt lethargic, recently receiving ativan per RN. Pt responds minimally to verbal and tactile cueing at this time. PT will follow up when the patient is more alert and better able to mobilize.   Zenaida Niece 06/04/2021, 4:51 PM

## 2021-06-04 NOTE — Progress Notes (Signed)
PROGRESS NOTE    Melanie [name]  LTJ:[id number] DOB: 04/14/1941 DOA: 05/25/2021 PCP: Martinique, Betty G, MD    Brief Narrative:  Melanie [name] is a 80 year old female with past medical history significant for COPD, multinodular goiter, essential hypertension, anxiety disorder, overactive bladder, paroxysmal atrial fibrillation, recent traumatic hemopneumothorax who presented to the ED with bilateral lower extremity edema.  Also her oxygen requirement has increased to 4 L/min with CT scan in the ED showing no evidence of pulmonary embolism but with moderate right hydropneumothorax with loculated effusion right lower lobe with associated atelectasis.  Patient was recently admitted to [facility] trauma service from 05/11/2021 through 05/20/2021 following fall out of bed with subsequent right-sided rib fractures with hemopneumothorax.  During that hospitalization, patient was intubated, underwent IR thoracentesis, right pigtail drain placement which was removed prior to her discharge.  Patient also developed A. fib with RVR and started on metoprolol and Cardizem per cardiology, Dr. Einar Gip with outpatient follow-up recommended.  Patient was discharged on 3 L nasal cannula.  Assessment & Plan:   Principal Problem:   Atrial fibrillation with RVR (HCC) Active Problems:   Primary hypertension   Hypercholesteremia   Multinodular goiter   Overactive bladder   GERD (gastroesophageal reflux disease)   Peripheral neuropathy due to chemotherapy (HCC)   Anxiety disorder   Closed fracture of multiple ribs with flail chest   Hydropneumothorax   Malnutrition of moderate degree   Atrial fibrillation with RVR TTE 05/18/2021 limited by pain due to flail chest and hydropneumothorax but with grossly preserved LVEF. --Cardizem 60 mg p.o. 4 times daily; transition to Cardizem CD 240 mg p.o. daily tomorrow --Eliquis for anticoagulation --Continue monitor on telemetry  Right leg DVT, recently  diagnosed --Eliquis   Right-sided hydropneumothorax with loculated effusion, recurrent Multiple right-sided rib fractures During previous hospitalization, patient had pigtail catheter placed which was removed prior to discharge.  Patient's O2 requirements have increased since discharge and CT chest notable for moderate right hemopneumothorax with associated loculated effusion/atelectasis.  Repeat chest x-ray 06/03/2021 shows right pneumothorax diminished in size. --s/p chest tube 6/1 by IR, removed 6/8 --s/p right apical chest tube by IR 06/01/21; now on waterseal --Tylenol 1000 mg p.o. every 8 hours scheduled --Fentanyl as needed IV for severe breakthrough pain --IR for continued chest tube management  Essential hypertension BP 110/46 this morning --Losartan 100 mg p.o. daily --Metoprolol tartrate 50 mg p.o. twice daily --Cardizem 60 mg p.o. every 6 hours --Continue monitor BP closely  GERD --Protonix 40 mg p.o. daily  Anxiety --Lorazepam 0.5 mg p.o. every 6 hours as needed anxiety  Moderate protein calorie malnutrition Body mass index is 20.08 kg/m. Nutrition Status: Nutrition Problem: Moderate Malnutrition Etiology: chronic illness (COPD) Signs/Symptoms: moderate fat depletion, mild muscle depletion, severe muscle depletion Interventions: Ensure Enlive (each supplement provides 350kcal and 20 grams of protein), MVI --Dietitian following --Liberalize diet to regular --Continue to encourage increased oral intake, supplement use  Adult failure to thrive Patient is very thin and cachectic in appearance.  Family report poor oral intake for some time.  Patient with moderate protein calorie malnutrition.  Patient with recent fall from bed sustaining multiple right-sided rib fractures with associated hemopneumothorax as above.  Family meeting with palliative care was performed on 06/01/2021. --Palliative care following for goals of care and medical decision making; anticipate outpatient  palliative care to follow     DVT prophylaxis: Place and maintain sequential compression device Start: 05/25/21 1725 apixaban (ELIQUIS) tablet 10 mg  apixaban (  ELIQUIS) tablet 5 mg     Code Status: DNR Family Communication: No family present at bedside this morning, updated patient's grandson Joe extensively via telephone yesterday afternoon  Disposition Plan:  Level of care: Progressive Status is: Inpatient  Remains inpatient appropriate because:Ongoing active pain requiring inpatient pain management, Ongoing diagnostic testing needed not appropriate for outpatient work up, Unsafe d/c plan, IV treatments appropriate due to intensity of illness or inability to take PO and Inpatient level of care appropriate due to severity of illness   Dispo: The patient is from: Home              Anticipated d/c is to: SNF              Patient currently is not medically stable to d/c.   Difficult to place patient No   Consultants:  Interventional radiology Palliative care  Procedures:  10 F right-sided chest tube, IR, Dr. Laurence Ferrari; 6/1; removed 6/8 10F right apical chest tube, IR, Dr. Earleen Newport; 6/7  Antimicrobials:  none     Subjective: Patient seen examined bedside, resting comfortably.  Dyspnea stable, pain controlled.  No family present at bedside this morning.  Apical chest tube remains on waterseal.  No other questions or concerns at this time.  Patient denies headache, no dizziness, no chest pain, no fever/chills/night sweats, no nausea/vomiting/diarrhea, no abdominal pain.  No acute concerns overnight per nursing staff.  Objective: Vitals:   06/04/21 0003 06/04/21 0332 06/04/21 0747 06/04/21 1140  BP: (!) 109/58 (!) 104/54 (!) 127/55 (!) 101/49  Pulse:  88 95 70  Resp: 18 16 18 18   Temp: 97.9 F (36.6 C) 98.1 F (36.7 C) 97.8 F (36.6 C) 97.8 F (36.6 C)  TempSrc: Oral Axillary Axillary Axillary  SpO2: 98% 100% 100%   Weight:      Height:        Intake/Output Summary  (Last 24 hours) at 06/04/2021 1357 Last data filed at 06/04/2021 1000 Gross per 24 hour  Intake 120 ml  Output --  Net 120 ml   Filed Weights   05/25/21 1745 05/26/21 1200  Weight: 61.2 kg 59.9 kg    Examination:  General exam: Appears calm and comfortable, thin/cachectic/chronically ill in appearance Respiratory system: Breath sounds decreased right base, no wheezing/crackles, normal respiratory effort, on 3 L nasal cannula with SPO2 100% at rest.  Apical chest tube noted on suction. Cardiovascular system: S1 & S2 heard, RRR. No JVD, murmurs, rubs, gallops or clicks. No pedal edema. Gastrointestinal system: Abdomen is nondistended, soft and nontender. No organomegaly or masses felt. Normal bowel sounds heard. Central nervous system: Alert and oriented. No focal neurological deficits. Extremities: Symmetric 5 x 5 power. Skin: No rashes, lesions or ulcers Psychiatry: Judgement and insight appear normal. Mood & affect appropriate.     Data Reviewed: I have personally reviewed following labs and imaging studies  CBC: Recent Labs  Lab 05/29/21 0241 05/30/21 0305 06/02/21 0314 06/03/21 0349  WBC 8.1 6.7 7.9 6.6  HGB 10.1* 9.7* 10.4* 10.1*  HCT 32.5* 31.6* 36.0 34.2*  MCV 97.6 96.9 102.6* 102.1*  PLT 332 272 256 892   Basic Metabolic Panel: Recent Labs  Lab 05/30/21 0305 06/02/21 0314 06/03/21 0349  NA 143 144 146*  K 4.2 4.4 4.3  CL 99 95* 95*  CO2 36* 44* 47*  GLUCOSE 113* 142* 123*  BUN 24* 32* 30*  CREATININE 0.60 0.57 0.65  CALCIUM 9.6 10.0 9.8  MG  --  2.1  --  GFR: Estimated Creatinine Clearance: 52.2 mL/min (by C-G formula based on SCr of 0.65 mg/dL). Liver Function Tests: No results for input(s): AST, ALT, ALKPHOS, BILITOT, PROT, ALBUMIN in the last 168 hours. No results for input(s): LIPASE, AMYLASE in the last 168 hours. No results for input(s): AMMONIA in the last 168 hours. Coagulation Profile: No results for input(s): INR, PROTIME in the last 168  hours. Cardiac Enzymes: No results for input(s): CKTOTAL, CKMB, CKMBINDEX, TROPONINI in the last 168 hours. BNP (last 3 results) Recent Labs    03/30/21 1301  PROBNP 97.0   HbA1C: No results for input(s): HGBA1C in the last 72 hours. CBG: No results for input(s): GLUCAP in the last 168 hours. Lipid Profile: No results for input(s): CHOL, HDL, LDLCALC, TRIG, CHOLHDL, LDLDIRECT in the last 72 hours. Thyroid Function Tests: No results for input(s): TSH, T4TOTAL, FREET4, T3FREE, THYROIDAB in the last 72 hours. Anemia Panel: No results for input(s): VITAMINB12, FOLATE, FERRITIN, TIBC, IRON, RETICCTPCT in the last 72 hours. Sepsis Labs: No results for input(s): PROCALCITON, LATICACIDVEN in the last 168 hours.  No results found for this or any previous visit (from the past 240 hour(s)).        Radiology Studies: DG Chest Port 1 View  Result Date: 06/04/2021 CLINICAL DATA:  Chest tube EXAM: PORTABLE CHEST 1 VIEW COMPARISON:  Portable exam 1016 hours compared to 06/02/2021 FINDINGS: Pigtail RIGHT thoracostomy tube again identified. Normal heart size, mediastinal contours, and pulmonary vascularity. Atherosclerotic calcification aorta. RIGHT pleural effusion without residual pneumothorax. Underlying emphysematous changes. Bones demineralized. IMPRESSION: RIGHT thoracostomy tube without pneumothorax. Emphysematous changes with small RIGHT pleural effusion. Aortic Atherosclerosis (ICD10-I70.0) and Emphysema (ICD10-J43.9). Electronically Signed   By: Lavonia Dana M.D.   On: 06/04/2021 12:37   DG Chest Port 1 View  Result Date: 06/02/2021 CLINICAL DATA:  Follow-up pneumothorax. EXAM: PORTABLE CHEST 1 VIEW COMPARISON:  Radiograph earlier today.  CT 05/25/2021 FINDINGS: Right pigtail catheter coursing near the apex. Previous pigtail catheter at the right lung base is not definitively visualized. Previous pneumothorax has diminished without residual visualized. Suspected small pleural effusion at  the right lung base. Stable heart size and mediastinal contours with aortic atherosclerosis. Similar left basilar atelectasis. Right rib fractures again seen. IMPRESSION: 1. Upper right pigtail catheter remain in place. Previous pigtail catheter at the right lung base is no longer visualized. Right pneumothorax has diminished in size from prior exam, without residual. 2. Suspected small right pleural effusion. Similar left basilar atelectasis. Electronically Signed   By: Keith Rake M.D.   On: 06/02/2021 19:29         Scheduled Meds:  acetaminophen  1,000 mg Oral Q8H   apixaban  10 mg Oral BID   Followed by   Derrill Memo ON 06/06/2021] apixaban  5 mg Oral BID   [START ON 06/05/2021] diltiazem  240 mg Oral Daily   diltiazem  60 mg Oral Q6H   feeding supplement  237 mL Oral TID BM   loratadine  10 mg Oral Daily   losartan  100 mg Oral Daily   mouth rinse  15 mL Mouth Rinse BID   metoprolol tartrate  50 mg Oral BID   pantoprazole  40 mg Oral Daily   Continuous Infusions:   LOS: 10 days    Time spent: 38 minutes spent on chart review, discussion with nursing staff, consultants, updating family and interview/physical exam; more than 50% of that time was spent in counseling and/or coordination of care.    Donnamarie Poag  [place] DO [facility] Available via Epic secure chat 7am-7pm After these hours, please refer to coverage provider listed on [email or web address] 06/04/2021, 1:57 PM

## 2021-06-05 ENCOUNTER — Inpatient Hospital Stay (HOSPITAL_COMMUNITY): Payer: Medicare Other

## 2021-06-05 LAB — CBC
HCT: 34.8 % — ABNORMAL LOW (ref 36.0–46.0)
Hemoglobin: 10.1 g/dL — ABNORMAL LOW (ref 12.0–15.0)
MCH: 29.5 pg (ref 26.0–34.0)
MCHC: 29 g/dL — ABNORMAL LOW (ref 30.0–36.0)
MCV: 101.8 fL — ABNORMAL HIGH (ref 80.0–100.0)
Platelets: 195 10*3/uL (ref 150–400)
RBC: 3.42 MIL/uL — ABNORMAL LOW (ref 3.87–5.11)
RDW: 13.4 % (ref 11.5–15.5)
WBC: 5.5 10*3/uL (ref 4.0–10.5)
nRBC: 0 % (ref 0.0–0.2)

## 2021-06-05 MED ORDER — LIDOCAINE 5 % EX PTCH
1.0000 | MEDICATED_PATCH | CUTANEOUS | Status: DC
  Administered 2021-06-05 – 2021-06-07 (×3): 1 via TRANSDERMAL
  Filled 2021-06-05 (×3): qty 1

## 2021-06-05 MED ORDER — METOPROLOL TARTRATE 5 MG/5ML IV SOLN
5.0000 mg | Freq: Once | INTRAVENOUS | Status: AC
  Administered 2021-06-05: 5 mg via INTRAVENOUS
  Filled 2021-06-05: qty 5

## 2021-06-05 MED ORDER — DILTIAZEM HCL 60 MG PO TABS
60.0000 mg | ORAL_TABLET | Freq: Four times a day (QID) | ORAL | Status: DC
  Administered 2021-06-05 (×2): 60 mg via ORAL
  Filled 2021-06-05 (×2): qty 1

## 2021-06-05 NOTE — Progress Notes (Addendum)
Palliative Medicine Inpatient Follow Up Note  Reason for consult:  Goals of Care   HPI:  Per intake H&P --> Melanie [name] is a 80 y.o. female with medical history significant of COPD, multinodular goiter, essential hypertension, anxiety disorder, overactive bladder, A. fib with RVR, recent traumatic hemopneumothorax presented in the ED with bilateral leg swelling.     Patient was recently admitted to the [facility] trauma service from 05/11/21- 05/20/21 for right-sided rib fractures with hemopneumothorax following fall from a bed.  During hospitalization patient was intubated and then extubated, She had a right pigtail drain placed and removed and underwent IR thoracocentesis for right pleural effusion.  During that admission Patient has also developed A. fib with RVR which was evaluated by cardiology Dr. Einar Gip and placed on metoprolol and Cardizem with recommendation for outpatient follow-up.  She also required supplemental oxygen during that admission and was discharged home on 3 L of oxygen.   Daughter in law reports intermittent shortness of breath with exertion, and progressive increase in leg swelling after she is discharged from hospital.  Her oxygen requirement has increased to 4 L today. CT chest: No definite evidence of pulmonary embolus. Moderate right hydropneumothorax is noted with loculated effusionseen in the right lower lobe and associated atelectasis of the right lower lobe. Stable appearance of right segmental rib fractures and flail chest is noted as described on prior exam.   Of care has been asked to get involved in the setting of multiple chronic comorbid conditions to further define goals of care.  Today's Discussion (06/05/2021):  *Please note that this is a verbal dictation therefore any spelling or grammatical errors are due to the "[employer]" system interpretation.  Chart reviewed. I met with Melanie [name] at bedside this morning. She remains weakened with very poor  PO intake. Both chest tubes have been removed. Plan for SNF placement to further see if improvements can be made as this is what the patient herself wishes for. I have shared my concerns with Melanie [name]'s family regarding likely continued decline. Patient will have OP Palliative support to further aid in transition to hospice when the time comes.   Objective Assessment: Vital Signs Vitals:   06/05/21 0758 06/05/21 1127  BP: (!) 129/56 (!) 129/56  Pulse: 96 (!) 123  Resp: 18   Temp: 97.6 F (36.4 C)   SpO2: 96%     Intake/Output Summary (Last 24 hours) at 06/05/2021 1155 Last data filed at 06/04/2021 1800 Gross per 24 hour  Intake 290 ml  Output 220 ml  Net 70 ml    Last Weight  Most recent update: 05/26/2021 12:01 PM    Weight  59.9 kg (132 lb 0.9 oz)             Gen:  Elderly F in NAD HEENT: moist mucous membranes CV: Irregular rate and rhythm  PULM: On 4LPM Melanie [name]  ABD: soft/nontender  EXT: (+) muscle wasting Neuro: Oriented - incrementally somnolent  SUMMARY OF RECOMMENDATIONS   DNAR/DNI   MOST / DNR on Chart  TOC - OP Palliative Support  Plan for SNF placement  Ongoing incremental PMT support   Time Spent: 25 Greater than 50% of the time was spent in counseling and coordination of care ______________________________________________________________________________________ [place] Team Team Cell Phone: [phone] Please utilize secure chat with additional questions, if there is no response within 30 minutes please call the above phone number  Palliative Medicine Team providers are available by  phone from 7am to 7pm daily and can be reached through the team cell phone.  Should this patient require assistance outside of these hours, please call the patient's attending physician.     

## 2021-06-05 NOTE — Progress Notes (Signed)
PROGRESS NOTE    Melanie [name]  ZOX:[id number] DOB: March 08, 1941 DOA: 05/25/2021 PCP: [place], Betty G, MD    Brief Narrative:  Melanie [name] is a 80 year old female with past medical history significant for COPD, multinodular goiter, essential hypertension, anxiety disorder, overactive bladder, paroxysmal atrial fibrillation, recent traumatic hemopneumothorax who presented to the ED with bilateral lower extremity edema.  Also her oxygen requirement has increased to 4 L/min with CT scan in the ED showing no evidence of pulmonary embolism but with moderate right hydropneumothorax with loculated effusion right lower lobe with associated atelectasis.  Patient was recently admitted to [facility] trauma service from 05/11/2021 through 05/20/2021 following fall out of bed with subsequent right-sided rib fractures with hemopneumothorax.  During that hospitalization, patient was intubated, underwent IR thoracentesis, right pigtail drain placement which was removed prior to her discharge.  Patient also developed A. fib with RVR and started on metoprolol and Cardizem per cardiology, Dr. Einar Gip with outpatient follow-up recommended.  Patient was discharged on 3 L nasal cannula.  Assessment & Plan:   Principal Problem:   Atrial fibrillation with RVR (HCC) Active Problems:   Primary hypertension   Hypercholesteremia   Multinodular goiter   Overactive bladder   GERD (gastroesophageal reflux disease)   Peripheral neuropathy due to chemotherapy (HCC)   Anxiety disorder   Closed fracture of multiple ribs with flail chest   Hydropneumothorax   Malnutrition of moderate degree   Atrial fibrillation with RVR TTE 05/18/2021 limited by pain due to flail chest and hydropneumothorax but with grossly preserved LVEF. --Cardizem 60 mg p.o. 4 times daily --Eliquis for anticoagulation --Continue monitor on telemetry  Right leg DVT, recently diagnosed --Eliquis   Right-sided hydropneumothorax with loculated  effusion, recurrent Multiple right-sided rib fractures During previous hospitalization, patient had pigtail catheter placed which was removed prior to discharge.  Patient's O2 requirements have increased since discharge and CT chest notable for moderate right hemopneumothorax with associated loculated effusion/atelectasis.  Repeat chest x-ray 06/03/2021 shows right pneumothorax diminished in size. --s/p chest tube 6/1 by IR, removed 6/8 --s/p right apical chest tube by IR 06/01/21, removed 6/10 --Tylenol 1000 mg p.o. every 8 hours scheduled --Fentanyl as needed IV for severe breakthrough pain --Lidocaine patch --repeat CXR today shows no pneumothorax --Continue supplemental oxygen  Essential hypertension BP 110/46 this morning --Losartan 100 mg p.o. daily --Metoprolol tartrate 50 mg p.o. twice daily --Cardizem 60 mg p.o. every 6 hours --Continue monitor BP closely  GERD --Protonix 40 mg p.o. daily  Anxiety --Lorazepam 0.5 mg p.o. every 6 hours as needed anxiety  Moderate protein calorie malnutrition Body mass index is 20.08 kg/m. Nutrition Status: Nutrition Problem: Moderate Malnutrition Etiology: chronic illness (COPD) Signs/Symptoms: moderate fat depletion, mild muscle depletion, severe muscle depletion Interventions: Ensure Enlive (each supplement provides 350kcal and 20 grams of protein), MVI --Dietitian following --Liberalize diet to regular --Continue to encourage increased oral intake, supplement use  Adult failure to thrive Patient is very thin and cachectic in appearance.  Family report poor oral intake for some time.  Patient with moderate protein calorie malnutrition.  Patient with recent fall from bed sustaining multiple right-sided rib fractures with associated hemopneumothorax as above.  Family meeting with palliative care was performed on 06/01/2021.  Patient continues with gradual decline with poor oral intake, ultimately very poor/grim prognosis. --Palliative care  following for goals of care and medical decision making; anticipate outpatient palliative care to follow at SNF     DVT prophylaxis: Place and maintain sequential compression  device Start: 05/25/21 1725 apixaban (ELIQUIS) tablet 10 mg  apixaban (ELIQUIS) tablet 5 mg     Code Status: DNR Family Communication: Updated family member present at bedside this morning Disposition Plan:  Level of care: Progressive Status is: Inpatient  Remains inpatient appropriate because:Ongoing active pain requiring inpatient pain management, Ongoing diagnostic testing needed not appropriate for outpatient work up, Unsafe d/c plan, IV treatments appropriate due to intensity of illness or inability to take PO and Inpatient level of care appropriate due to severity of illness   Dispo: The patient is from: Home              Anticipated d/c is to: SNF              Patient currently is not medically stable to d/c.   Difficult to place patient No   Consultants:  Interventional radiology Palliative care  Procedures:  10 F right-sided chest tube, IR, Dr. Laurence Ferrari; 6/1; removed 6/8 34F right apical chest tube, IR, Dr. Earleen Newport; 6/7  Antimicrobials:  none     Subjective: Patient seen examined bedside, sleeping but arousable.  Continues with poor oral intake.  Exacerbation of pain this morning with hypoxia when nasal cannula was accidentally removed.  Repeat chest x-ray shows no residual pneumothorax after chest tube was removed yesterday.  Family present at bedside and questions answered. Patient denies headache, no dizziness, no chest pain, no fever/chills/night sweats, no nausea/vomiting/diarrhea, no abdominal pain.  No acute concerns overnight per nursing staff.  Objective: Vitals:   06/05/21 1200 06/05/21 1215 06/05/21 1221 06/05/21 1236  BP: (!) 115/52 96/61 131/62 (!) 126/59  Pulse:    99  Resp:   (!) 24 20  Temp:      TempSrc:      SpO2:    100%  Weight:      Height:        Intake/Output  Summary (Last 24 hours) at 06/05/2021 1525 Last data filed at 06/04/2021 1800 Gross per 24 hour  Intake 120 ml  Output --  Net 120 ml   Filed Weights   05/25/21 1745 05/26/21 1200  Weight: 61.2 kg 59.9 kg    Examination:  General exam: Appears calm, thin/cachectic/chronically ill in appearance Respiratory system: Breath sounds decreased right base, no wheezing/crackles, normal respiratory effort, on 3 L nasal cannula with SPO2 100% at rest.   Cardiovascular system: S1 & S2 heard, RRR. No JVD, murmurs, rubs, gallops or clicks. No pedal edema. Gastrointestinal system: Abdomen is nondistended, soft and nontender. No organomegaly or masses felt. Normal bowel sounds heard. Central nervous system: Alert and oriented. No focal neurological deficits. Extremities: Symmetric 5 x 5 power. Skin: No rashes, lesions or ulcers Psychiatry: Judgement and insight appear normal. Mood & affect appropriate.     Data Reviewed: I have personally reviewed following labs and imaging studies  CBC: Recent Labs  Lab 05/30/21 0305 06/02/21 0314 06/03/21 0349 06/05/21 1001  WBC 6.7 7.9 6.6 5.5  HGB 9.7* 10.4* 10.1* 10.1*  HCT 31.6* 36.0 34.2* 34.8*  MCV 96.9 102.6* 102.1* 101.8*  PLT 272 256 210 572   Basic Metabolic Panel: Recent Labs  Lab 05/30/21 0305 06/02/21 0314 06/03/21 0349  NA 143 144 146*  K 4.2 4.4 4.3  CL 99 95* 95*  CO2 36* 44* 47*  GLUCOSE 113* 142* 123*  BUN 24* 32* 30*  CREATININE 0.60 0.57 0.65  CALCIUM 9.6 10.0 9.8  MG  --  2.1  --    GFR: Estimated  Creatinine Clearance: 52.2 mL/min (by C-G formula based on SCr of 0.65 mg/dL). Liver Function Tests: No results for input(s): AST, ALT, ALKPHOS, BILITOT, PROT, ALBUMIN in the last 168 hours. No results for input(s): LIPASE, AMYLASE in the last 168 hours. No results for input(s): AMMONIA in the last 168 hours. Coagulation Profile: No results for input(s): INR, PROTIME in the last 168 hours. Cardiac Enzymes: No results for  input(s): CKTOTAL, CKMB, CKMBINDEX, TROPONINI in the last 168 hours. BNP (last 3 results) Recent Labs    03/30/21 1301  PROBNP 97.0   HbA1C: No results for input(s): HGBA1C in the last 72 hours. CBG: No results for input(s): GLUCAP in the last 168 hours. Lipid Profile: No results for input(s): CHOL, HDL, LDLCALC, TRIG, CHOLHDL, LDLDIRECT in the last 72 hours. Thyroid Function Tests: No results for input(s): TSH, T4TOTAL, FREET4, T3FREE, THYROIDAB in the last 72 hours. Anemia Panel: No results for input(s): VITAMINB12, FOLATE, FERRITIN, TIBC, IRON, RETICCTPCT in the last 72 hours. Sepsis Labs: No results for input(s): PROCALCITON, LATICACIDVEN in the last 168 hours.  No results found for this or any previous visit (from the past 240 hour(s)).        Radiology Studies: DG CHEST PORT 1 VIEW  Result Date: 06/05/2021 CLINICAL DATA:  Short of breath.  Evaluate right-sided chest tube. EXAM: PORTABLE CHEST 1 VIEW COMPARISON:  Earlier today at 617 a.m. FINDINGS: 12:25 p.m. Midline trachea. Mild cardiomegaly. Atherosclerosis in the transverse aorta. Small right pleural effusion is similar no pneumothorax. Right base airspace disease remains. Right upper lobe scarring laterally. Displaced right rib fractures. IMPRESSION: No pneumothorax. Similar right pleural effusion and adjacent airspace disease. Aortic Atherosclerosis (ICD10-I70.0). Electronically Signed   By: Abigail Miyamoto M.D.   On: 06/05/2021 12:53   DG Chest Port 1 View  Result Date: 06/05/2021 CLINICAL DATA:  Follow-up pleural effusion. EXAM: PORTABLE CHEST 1 VIEW COMPARISON:  06/04/2021 FINDINGS: The right chest tube is stable. No recurrent pneumothorax is identified. Persistent loculated right basilar pleural effusion with overlying atelectasis. No definite left-sided effusion. IMPRESSION: Stable right chest tube. No recurrent pneumothorax. Persistent right effusion and overlying atelectasis. Electronically Signed   By: Marijo Sanes  M.D.   On: 06/05/2021 09:51   DG Chest Port 1 View  Result Date: 06/04/2021 CLINICAL DATA:  Chest tube EXAM: PORTABLE CHEST 1 VIEW COMPARISON:  Portable exam 1016 hours compared to 06/02/2021 FINDINGS: Pigtail RIGHT thoracostomy tube again identified. Normal heart size, mediastinal contours, and pulmonary vascularity. Atherosclerotic calcification aorta. RIGHT pleural effusion without residual pneumothorax. Underlying emphysematous changes. Bones demineralized. IMPRESSION: RIGHT thoracostomy tube without pneumothorax. Emphysematous changes with small RIGHT pleural effusion. Aortic Atherosclerosis (ICD10-I70.0) and Emphysema (ICD10-J43.9). Electronically Signed   By: Lavonia Dana M.D.   On: 06/04/2021 12:37         Scheduled Meds:  acetaminophen  1,000 mg Oral Q8H   apixaban  10 mg Oral BID   Followed by   Derrill Memo ON 06/06/2021] apixaban  5 mg Oral BID   diltiazem  60 mg Oral Q6H   feeding supplement  237 mL Oral TID BM   loratadine  10 mg Oral Daily   losartan  100 mg Oral Daily   mouth rinse  15 mL Mouth Rinse BID   metoprolol tartrate  50 mg Oral BID   pantoprazole  40 mg Oral Daily   Continuous Infusions:   LOS: 11 days    Time spent: 36 minutes spent on chart review, discussion with nursing staff, consultants, updating family  and interview/physical exam; more than 50% of that time was spent in counseling and/or coordination of care.    Juandavid Dallman J [place] DO [facility] Available via Epic secure chat 7am-7pm After these hours, please refer to coverage provider listed on [email or web address] 06/05/2021, 3:25 PM

## 2021-06-06 LAB — URINALYSIS, ROUTINE W REFLEX MICROSCOPIC
Bilirubin Urine: NEGATIVE
Glucose, UA: NEGATIVE mg/dL
Hgb urine dipstick: NEGATIVE
Ketones, ur: NEGATIVE mg/dL
Nitrite: NEGATIVE
Protein, ur: 100 mg/dL — AB
Specific Gravity, Urine: 1.016 (ref 1.005–1.030)
WBC, UA: >50 WBC/hpf — ABNORMAL HIGH (ref 0–5)
pH: 9 — ABNORMAL HIGH (ref 5.0–8.0)

## 2021-06-06 MED ORDER — GLYCOPYRROLATE 0.2 MG/ML IJ SOLN: 0.2000 mg | INTRAMUSCULAR | Status: DC | PRN

## 2021-06-06 MED ORDER — BIOTENE DRY MOUTH MT LIQD: 15.0000 mL | OROMUCOSAL | Status: DC | PRN

## 2021-06-06 MED ORDER — DILTIAZEM HCL 60 MG PO TABS
60.0000 mg | ORAL_TABLET | Freq: Four times a day (QID) | ORAL | Status: DC
  Administered 2021-06-06 – 2021-06-08 (×8): 60 mg via ORAL
  Filled 2021-06-06 (×9): qty 1

## 2021-06-06 MED ORDER — LOSARTAN POTASSIUM 50 MG PO TABS: 50.0000 mg | ORAL_TABLET | Freq: Every day | ORAL | Status: DC

## 2021-06-06 MED ORDER — LORAZEPAM 2 MG/ML IJ SOLN
0.5000 mg | INTRAMUSCULAR | Status: DC | PRN
Start: 2021-06-06 — End: 2021-06-09
  Administered 2021-06-06 – 2021-06-07 (×3): 0.5 mg via INTRAVENOUS
  Filled 2021-06-06 (×3): qty 1

## 2021-06-06 MED ORDER — GLYCOPYRROLATE 1 MG PO TABS
1.0000 mg | ORAL_TABLET | ORAL | Status: DC | PRN
  Filled 2021-06-06: qty 1

## 2021-06-06 MED ORDER — SODIUM CHLORIDE 0.9 % IV SOLN
1.0000 g | INTRAVENOUS | Status: DC
  Administered 2021-06-06: 1 g via INTRAVENOUS
  Filled 2021-06-06: qty 10

## 2021-06-06 MED ORDER — POLYVINYL ALCOHOL 1.4 % OP SOLN
1.0000 [drp] | Freq: Four times a day (QID) | OPHTHALMIC | Status: DC | PRN
  Filled 2021-06-06: qty 15

## 2021-06-06 NOTE — Progress Notes (Signed)
PROGRESS NOTE    [name]  RWE:[id number] DOB: December 25, 1941 DOA: 05/25/2021 PCP: [place], Betty G, MD    Brief Narrative:  Melanie [name] is a 80 year old female with past medical history significant for COPD, multinodular goiter, essential hypertension, anxiety disorder, overactive bladder, paroxysmal atrial fibrillation, recent traumatic hemopneumothorax who presented to the ED with bilateral lower extremity edema.  Also her oxygen requirement has increased to 4 L/min with CT scan in the ED showing no evidence of pulmonary embolism but with moderate right hydropneumothorax with loculated effusion right lower lobe with associated atelectasis.  Patient was recently admitted to [facility] trauma service from 05/11/2021 through 05/20/2021 following fall out of bed with subsequent right-sided rib fractures with hemopneumothorax.  During that hospitalization, patient was intubated, underwent IR thoracentesis, right pigtail drain placement which was removed prior to her discharge.  Patient also developed A. fib with RVR and started on metoprolol and Cardizem per cardiology, Dr. Einar Gip with outpatient follow-up recommended.  Patient was discharged on 3 L nasal cannula.  Assessment & Plan:   Principal Problem:   Atrial fibrillation with RVR (HCC) Active Problems:   Primary hypertension   Hypercholesteremia   Multinodular goiter   Overactive bladder   GERD (gastroesophageal reflux disease)   Peripheral neuropathy due to chemotherapy [facility])   Anxiety disorder   Closed fracture of multiple ribs with flail chest   Hydropneumothorax   Malnutrition of moderate degree   Atrial fibrillation with RVR TTE 05/18/2021 limited by pain due to flail chest and hydropneumothorax but with grossly preserved LVEF. --Cardizem 60 mg p.o. 4 times daily --Metoprolol tartrate 50 mg p.o. twice daily --Eliquis for anticoagulation --Continue monitor on telemetry  Right leg DVT, recently diagnosed --Eliquis    Right-sided hydropneumothorax with loculated effusion, recurrent Multiple right-sided rib fractures During previous hospitalization, patient had pigtail catheter placed which was removed prior to discharge.  Patient's O2 requirements have increased since discharge and CT chest notable for moderate right hemopneumothorax with associated loculated effusion/atelectasis.  Repeat chest x-ray 06/03/2021 shows right pneumothorax diminished in size. --s/p chest tube 6/1 by IR, removed 6/8 --s/p right apical chest tube by IR 06/01/21, removed 6/10 --Tylenol 1000 mg p.o. every 8 hours scheduled --Fentanyl as needed IV for severe breakthrough pain --Lidocaine patch --Continue supplemental oxygen --Repeat chest x-ray in a.m.  [facility] Nursing reports foul-smelling urine, patient complaining of increased urinary frequency.  Urinalysis with large leukocytes, negative nitrate, WBC greater than 50. --Urine culture --Ceftriaxone 1 g IV every 24 hours x3 days  Essential hypertension BP 100/59 this morning --Reduce losartan to 50 mg p.o. daily --Metoprolol tartrate 50 mg p.o. twice daily --Cardizem 60 mg p.o. every 6 hours --Continue monitor BP closely  GERD --Protonix 40 mg p.o. daily  Anxiety --Lorazepam 0.5 mg p.o. every 6 hours as needed anxiety  Moderate protein calorie malnutrition Body mass index is 20.08 kg/m. Nutrition Status: Nutrition Problem: Moderate Malnutrition Etiology: chronic illness (COPD) Signs/Symptoms: moderate fat depletion, mild muscle depletion, severe muscle depletion Interventions: Ensure Enlive (each supplement provides 350kcal and 20 grams of protein), MVI --Dietitian following --Liberalize diet to regular --Continue to encourage increased oral intake, supplement use  Adult failure to thrive Patient is very thin and cachectic in appearance.  Family report poor oral intake for some time.  Patient with moderate protein calorie malnutrition.  Patient with recent fall from  bed sustaining multiple right-sided rib fractures with associated hemopneumothorax as above.  Family meeting with palliative care was performed on 06/01/2021.  Patient  continues with gradual decline with poor oral intake, ultimately very poor/grim prognosis. --Palliative care following for goals of care and medical decision making; anticipate outpatient palliative care to follow at SNF     DVT prophylaxis: Place and maintain sequential compression device Start: 05/25/21 1725 apixaban (ELIQUIS) tablet 5 mg     Code Status: DNR Family Communication: No family present at bedside this morning. Disposition Plan:  Level of care: Progressive Status is: Inpatient  Remains inpatient appropriate because:Ongoing active pain requiring inpatient pain management, Ongoing diagnostic testing needed not appropriate for outpatient work up, Unsafe d/c plan, IV treatments appropriate due to intensity of illness or inability to take PO and Inpatient level of care appropriate due to severity of illness   Dispo: The patient is from: Home              Anticipated d/c is to: SNF              Patient currently is not medically stable to d/c.   Difficult to place patient No   Consultants:  Interventional radiology Palliative care  Procedures:  10 F right-sided chest tube, IR, Dr. Laurence Ferrari; 6/1; removed 6/8 63F right apical chest tube, IR, Dr. Earleen Newport; 6/7; removed 6/10  Antimicrobials:  Ceftriaxone 6/12>>     Subjective: Patient seen examined bedside, resting comfortably.  States pain is controlled.  Drinking her Ensure.  No family present this morning.  Patient denies headache, no dizziness, no chest pain, no fever/chills/night sweats, no nausea/vomiting/diarrhea, no abdominal pain.  No acute concerns overnight per nursing staff.  Objective: Vitals:   06/06/21 0300 06/06/21 0454 06/06/21 0853 06/06/21 0856  BP: 131/66 (!) 100/59  135/65  Pulse:  82 (!) 115   Resp:    18  Temp:  98 F (36.7 C)     TempSrc:  Axillary    SpO2:  100%    Weight:      Height:        Intake/Output Summary (Last 24 hours) at 06/06/2021 1237 Last data filed at 06/06/2021 0900 Gross per 24 hour  Intake 230 ml  Output --  Net 230 ml   Filed Weights   05/25/21 1745 05/26/21 1200  Weight: 61.2 kg 59.9 kg    Examination:  General exam: Appears calm, thin/cachectic/chronically ill in appearance Respiratory system: Breath sounds decreased right base, no wheezing/crackles, normal respiratory effort, on 4 L nasal cannula with SPO2 100% at rest.   Cardiovascular system: S1 & S2 heard, RRR. No JVD, murmurs, rubs, gallops or clicks. No pedal edema. Gastrointestinal system: Abdomen is nondistended, soft and nontender. No organomegaly or masses felt. Normal bowel sounds heard. Central nervous system: Alert and oriented. No focal neurological deficits. Extremities: Symmetric 5 x 5 power. Skin: No rashes, lesions or ulcers Psychiatry: Judgement and insight appear normal. Mood & affect appropriate.     Data Reviewed: I have personally reviewed following labs and imaging studies  CBC: Recent Labs  Lab 06/02/21 0314 06/03/21 0349 06/05/21 1001  WBC 7.9 6.6 5.5  HGB 10.4* 10.1* 10.1*  HCT 36.0 34.2* 34.8*  MCV 102.6* 102.1* 101.8*  PLT 256 210 993   Basic Metabolic Panel: Recent Labs  Lab 06/02/21 0314 06/03/21 0349  NA 144 146*  K 4.4 4.3  CL 95* 95*  CO2 44* 47*  GLUCOSE 142* 123*  BUN 32* 30*  CREATININE 0.57 0.65  CALCIUM 10.0 9.8  MG 2.1  --    GFR: Estimated Creatinine Clearance: 52.2 mL/min (by C-G formula  based on SCr of 0.65 mg/dL). Liver Function Tests: No results for input(s): AST, ALT, ALKPHOS, BILITOT, PROT, ALBUMIN in the last 168 hours. No results for input(s): LIPASE, AMYLASE in the last 168 hours. No results for input(s): AMMONIA in the last 168 hours. Coagulation Profile: No results for input(s): INR, PROTIME in the last 168 hours. Cardiac Enzymes: No results for  input(s): CKTOTAL, CKMB, CKMBINDEX, TROPONINI in the last 168 hours. BNP (last 3 results) Recent Labs    03/30/21 1301  PROBNP 97.0   HbA1C: No results for input(s): HGBA1C in the last 72 hours. CBG: No results for input(s): GLUCAP in the last 168 hours. Lipid Profile: No results for input(s): CHOL, HDL, LDLCALC, TRIG, CHOLHDL, LDLDIRECT in the last 72 hours. Thyroid Function Tests: No results for input(s): TSH, T4TOTAL, FREET4, T3FREE, THYROIDAB in the last 72 hours. Anemia Panel: No results for input(s): VITAMINB12, FOLATE, FERRITIN, TIBC, IRON, RETICCTPCT in the last 72 hours. Sepsis Labs: No results for input(s): PROCALCITON, LATICACIDVEN in the last 168 hours.  No results found for this or any previous visit (from the past 240 hour(s)).        Radiology Studies: DG CHEST PORT 1 VIEW  Result Date: 06/05/2021 CLINICAL DATA:  Short of breath.  Evaluate right-sided chest tube. EXAM: PORTABLE CHEST 1 VIEW COMPARISON:  Earlier today at 617 a.m. FINDINGS: 12:25 p.m. Midline trachea. Mild cardiomegaly. Atherosclerosis in the transverse aorta. Small right pleural effusion is similar no pneumothorax. Right base airspace disease remains. Right upper lobe scarring laterally. Displaced right rib fractures. IMPRESSION: No pneumothorax. Similar right pleural effusion and adjacent airspace disease. Aortic Atherosclerosis (ICD10-I70.0). Electronically Signed   By: Abigail Miyamoto M.D.   On: 06/05/2021 12:53   DG Chest Port 1 View  Result Date: 06/05/2021 CLINICAL DATA:  Follow-up pleural effusion. EXAM: PORTABLE CHEST 1 VIEW COMPARISON:  06/04/2021 FINDINGS: The right chest tube is stable. No recurrent pneumothorax is identified. Persistent loculated right basilar pleural effusion with overlying atelectasis. No definite left-sided effusion. IMPRESSION: Stable right chest tube. No recurrent pneumothorax. Persistent right effusion and overlying atelectasis. Electronically Signed   By: Marijo Sanes  M.D.   On: 06/05/2021 09:51         Scheduled Meds:  acetaminophen  1,000 mg Oral Q8H   apixaban  5 mg Oral BID   diltiazem  60 mg Oral Q6H   feeding supplement  237 mL Oral TID BM   lidocaine  1 patch Transdermal Q24H   loratadine  10 mg Oral Daily   losartan  100 mg Oral Daily   mouth rinse  15 mL Mouth Rinse BID   metoprolol tartrate  50 mg Oral BID   pantoprazole  40 mg Oral Daily   Continuous Infusions:  cefTRIAXone (ROCEPHIN)  IV 1 g (06/06/21 1216)     LOS: 12 days    Time spent: 35 minutes spent on chart review, discussion with nursing staff, consultants, updating family and interview/physical exam; more than 50% of that time was spent in counseling and/or coordination of care.    Rhydian Baldi J [place] DO [facility] Available via Epic secure chat 7am-7pm After these hours, please refer to coverage provider listed on [email or web address] 06/06/2021, 12:37 PM

## 2021-06-06 NOTE — Progress Notes (Signed)
Received request from [facility] for family interest in [facility]. Chart reviewed and eligibility is pending at this time. Spoke with grandson [name] to acknowledge referral and explain services. Unfortunately [facility] does not have a bed to offer today. TOC is aware hospital liaison will follow up tomorrow or sooner if room becomes available.   Please do not hesitate to call with any hospice related questions or concerns.   Thank you for the opportunity to participate in this patient's care.  Jhonnie Garner, Therapist, sports, [facility] Liaison  [phone]

## 2021-06-06 NOTE — TOC Progression Note (Signed)
Transition of Care [facility]) - Progression Note    Patient Details  Name: Melanie [name] MRN: [id number] Date of Birth: July 25, 1941  Transition of Care [facility]) CM/SW [place], LCSW Phone Number:[phone] 06/06/2021, 3:04 PM  Clinical Narrative:    CSW received a consult for inpt hospice. CSW spoke with pt's Milana Obey and he confirmed that he wishes for pt to stay in [place]. CSW provided the name of the facility [facility]. CSW spoke with Misty at [facility] and made the referral.  [facility] team will continue to assist with discharge planning needs.   Expected Discharge Plan: [place] Barriers to Discharge: Continued Medical Work up  Expected Discharge Plan and Services Expected Discharge Plan: [place] In-house Referral: Clinical Social Work     Living arrangements for the past 2 months: Single Family Home                                       Social Determinants of Health (SDOH) Interventions    Readmission Risk Interventions Readmission Risk Prevention Plan 05/20/2021 05/20/2021  Post Dischage Appt Complete Complete  Medication Screening Complete Complete  Transportation Screening Complete Complete  Some recent data might be hidden

## 2021-06-06 NOTE — Progress Notes (Signed)
Palliative Medicine Inpatient Follow Up Note  Reason for consult:  Goals of Care   HPI:  Per intake H&P --> Melanie [name] is a 80 y.o. female with medical history significant of COPD, multinodular goiter, essential hypertension, anxiety disorder, overactive bladder, A. fib with RVR, recent traumatic hemopneumothorax presented in the ED with bilateral leg swelling.     Patient was recently admitted to the [facility] trauma service from 05/11/21- 05/20/21 for right-sided rib fractures with hemopneumothorax following fall from a bed.  During hospitalization patient was intubated and then extubated, She had a right pigtail drain placed and removed and underwent IR thoracocentesis for right pleural effusion.  During that admission Patient has also developed A. fib with RVR which was evaluated by cardiology Dr. Einar Gip and placed on metoprolol and Cardizem with recommendation for outpatient follow-up.  She also required supplemental oxygen during that admission and was discharged home on 3 L of oxygen.   Daughter in law reports intermittent shortness of breath with exertion, and progressive increase in leg swelling after she is discharged from hospital.  Her oxygen requirement has increased to 4 L today. CT chest: No definite evidence of pulmonary embolus. Moderate right hydropneumothorax is noted with loculated effusionseen in the right lower lobe and associated atelectasis of the right lower lobe. Stable appearance of right segmental rib fractures and flail chest is noted as described on prior exam.   Of care has been asked to get involved in the setting of multiple chronic comorbid conditions to further define goals of care.  Today's Discussion (06/06/2021):  *Please note that this is a verbal dictation therefore any spelling or grammatical errors are due to the "[employer]" system interpretation.  Chart reviewed. I met with Melanie [name] at bedside this morning. She remains very fatigued and weak.  I shared with her my concerns regarding her clinical state which she was responsive to.  I later checked in on Melanie [name], her daughter Melanie [name] was at bedside. Melanie [name]'s RN, Melanie [name] was providing some medications.  We stepped outside the room, I asked Melanie [name] if we could call Melanie [name]'s grandson Melanie [name] to further discuss [name]'s declined state. Melanie [name] expresses that she believes her mother is suffering and she shares that she promised her mother she would never send her to a nursing home which is now exactly what we are doing. I offered support for her during this time as she expressed her fears.   We called Melanie [name] on speaker phone. Melanie [name] shared with me that he spend six hours with Melanie [name] yesterday and he does not have great hope for her in terms of her ability to rehabilitate. He states that he has seen how weakened she is with even slight movements and realizes if she were to improve it would be a very long and difficult road fraught with challenges.  We reviewed Melanie [name]'s clinical condition. Discussed that she has endures a tremendous trauma and recurrent hospital stays because of it. I shared that it at this point it most important to further identify what Melanie [name] wants.  I went back to bedside with Melanie [name] thereafter. I enabled Melanie [name] time to eat some of her lunch and then began conversation. I shared again, the concerns myself and the medical team share in regards to rehabilitation. I shared that with Melanie [name]'s desaturations that occur very quickly I am concerned her rehab journey would be short and not fruitful. Melanie [name] also gave her insights on her mother being clear about never wishing to go to a nursing.  I offered that another alternative taking all of the factors into account is providing comfort at the end of life. Melanie [name] expressed understanding. We talked about transition to comfort measures in house and what that would entail inclusive of medications to control pain, dyspnea, agitation, nausea, itching,  and hiccups.  We discussed stopping all uneccessary measures such as blood draws, needle sticks, and frequent vital signs. Melanie [name] said "that is what I want" We clarified this in conversation again which she responded again stating, "that is what I want". Reviewed that this means her time will be short but at the very least the most can be made of it by spending time with her family and receiving medication to alleviate discomfort.   I called patients grandson, Melanie [name] afterwards. He was in agreement. We further discussed inpatient hospice for which I shared a social worker will further discuss with him.   Objective Assessment: Vital Signs Vitals:   06/06/21 0853 06/06/21 0856  BP:  135/65  Pulse: (!) 115   Resp:  18  Temp:    SpO2:      Intake/Output Summary (Last 24 hours) at 06/06/2021 1503 Last data filed at 06/06/2021 0900 Gross per 24 hour  Intake 230 ml  Output --  Net 230 ml    Last Weight  Most recent update: 05/26/2021 12:01 PM    Weight  59.9 kg (132 lb 0.9 oz)             Gen:  Elderly F in NAD HEENT: moist mucous membranes CV: Irregular rate and rhythm  PULM: On 4LPM Melanie [name]  ABD: soft/nontender  EXT: (+) muscle wasting Neuro: Oriented - incrementally somnolent  SUMMARY OF RECOMMENDATIONS   DNAR/DNI   Comfort care  Medications per [facility]  Unrestricted visitation  TOC - Inpatient Hospice  Ongoing incremental PMT support   Time Spent: 80 Greater than 50% of the time was spent in counseling and coordination of care ______________________________________________________________________________________ [place] Team Team Cell Phone: [phone] Please utilize secure chat with additional questions, if there is no response within 30 minutes please call the above phone number  Palliative Medicine Team providers are available by phone from 7am to 7pm daily and can be reached through the team cell phone.  Should this patient  require assistance outside of these hours, please call the patient's attending physician.

## 2021-06-07 MED ORDER — LORAZEPAM 2 MG/ML IJ SOLN
1.0000 mg | Freq: Four times a day (QID) | INTRAMUSCULAR | Status: DC
  Administered 2021-06-07 – 2021-06-09 (×6): 1 mg via INTRAVENOUS
  Filled 2021-06-07 (×6): qty 1

## 2021-06-07 MED ORDER — FENTANYL CITRATE (PF) 100 MCG/2ML IJ SOLN
25.0000 ug | INTRAMUSCULAR | Status: DC | PRN
  Administered 2021-06-07: 25 ug via INTRAVENOUS
  Filled 2021-06-07: qty 2

## 2021-06-07 NOTE — Progress Notes (Signed)
Manufacturing engineer [facility]) Hospital Liaison Note:  Update on this referral:  Unfortunately, [place] is unable to offer a room this afternoon. [facility] [place] will continue to follow Ms. Cyphers and update Family and TOC tomorrow or sooner if a bed becomes available. Grandson and TOC made aware.  Please reach out with any hospice related issues or questions as needed,  Gar Ponto, RN [facility] HLT [phone]

## 2021-06-07 NOTE — Progress Notes (Signed)
PROGRESS NOTE    Melanie [name]  ZOX:[id number] DOB: Jul 24, 1941 DOA: 05/25/2021 PCP: [place], Betty G, MD    Brief Narrative:  Melanie [name] is a 80 year old female with past medical history significant for COPD, multinodular goiter, essential hypertension, anxiety disorder, overactive bladder, paroxysmal atrial fibrillation, recent traumatic hemopneumothorax who presented to the ED with bilateral lower extremity edema.  Also her oxygen requirement has increased to 4 L/min with CT scan in the ED showing no evidence of pulmonary embolism but with moderate right hydropneumothorax with loculated effusion right lower lobe with associated atelectasis.  Patient was recently admitted to [facility] trauma service from 05/11/2021 through 05/20/2021 following fall out of bed with subsequent right-sided rib fractures with hemopneumothorax.  During that hospitalization, patient was intubated, underwent IR thoracentesis, right pigtail drain placement which was removed prior to her discharge.  Patient also developed A. fib with RVR and started on metoprolol and Cardizem per cardiology, Dr. Einar Gip with outpatient follow-up recommended.  Patient was discharged on 3 L nasal cannula.  Assessment & Plan:   Principal Problem:   Atrial fibrillation with RVR (HCC) Active Problems:   Primary hypertension   Hypercholesteremia   Multinodular goiter   Overactive bladder   GERD (gastroesophageal reflux disease)   Peripheral neuropathy due to chemotherapy [facility])   Anxiety disorder   Closed fracture of multiple ribs with flail chest   Hydropneumothorax   Malnutrition of moderate degree   Atrial fibrillation with RVR TTE 05/18/2021 limited by pain due to flail chest and hydropneumothorax but with grossly preserved LVEF. --Cardizem 60 mg p.o. 4 times daily --Metoprolol tartrate 50 mg p.o. twice daily --Eliquis for anticoagulation --Continue monitor on telemetry  Right leg DVT, recently diagnosed --Eliquis    Right-sided hydropneumothorax with loculated effusion, recurrent Multiple right-sided rib fractures During previous hospitalization, patient had pigtail catheter placed which was removed prior to discharge.  Patient's O2 requirements have increased since discharge and CT chest notable for moderate right hemopneumothorax with associated loculated effusion/atelectasis.  Repeat chest x-ray 06/03/2021 shows right pneumothorax diminished in size. --s/p chest tube 6/1 by IR, removed 6/8 --s/p right apical chest tube by IR 06/01/21, removed 6/10 --Tylenol 1000 mg p.o. every 8 hours scheduled --Fentanyl as needed IV for severe breakthrough pain --Lidocaine patch --Continue supplemental oxygen  Essential hypertension BP 135/65 this morning --Discontinue losartan --Metoprolol tartrate 50 mg p.o. twice daily --Cardizem 60 mg p.o. every 6 hours --Continue monitor BP closely  GERD --Protonix 40 mg p.o. daily  Anxiety --Lorazepam 0.5 mg p.o. every 6 hours as needed anxiety  Moderate protein calorie malnutrition Body mass index is 20.08 kg/m. Nutrition Status: Nutrition Problem: Moderate Malnutrition Etiology: chronic illness (COPD) Signs/Symptoms: moderate fat depletion, mild muscle depletion, severe muscle depletion Interventions: Ensure Enlive (each supplement provides 350kcal and 20 grams of protein), MVI --Dietitian following --Liberalize diet to regular --Continue to encourage increased oral intake, supplement use  Adult failure to thrive Patient is very thin and cachectic in appearance.  Family report poor oral intake for some time.  Patient with moderate protein calorie malnutrition.  Patient with recent fall from bed sustaining multiple right-sided rib fractures with associated hemopneumothorax as above.  Family meeting with palliative care was performed on 06/01/2021.  Patient continues with gradual decline with poor oral intake, ultimately very poor/grim prognosis. -- Pending discharge  to [facility] residential hospice     DVT prophylaxis:  apixaban (ELIQUIS) tablet 5 mg     Code Status: DNR Family Communication: No family present  at bedside this morning. Disposition Plan:  Level of care: Med-Surg Status is: Inpatient  Remains inpatient appropriate because:Ongoing active pain requiring inpatient pain management, Ongoing diagnostic testing needed not appropriate for outpatient work up, Unsafe d/c plan, IV treatments appropriate due to intensity of illness or inability to take PO and Inpatient level of care appropriate due to severity of illness   Dispo: The patient is from: Home              Anticipated d/c is to: SNF              Patient currently is not medically stable to d/c.   Difficult to place patient No   Consultants:  Interventional radiology Palliative care  Procedures:  10 F right-sided chest tube, IR, Dr. Laurence Ferrari; 6/1; removed 6/8 50F right apical chest tube, IR, Dr. Earleen Newport; 6/7; removed 6/10  Antimicrobials:  Ceftriaxone 6/12 - 6/12     Subjective: Patient seen examined bedside, resting comfortably.  States that she "feels trapped", wants to get out of the bed this morning.  No beds available at [facility] unfortunately today.  No family present.  No other complaints at this time.  Denies shortness of breath, no chest pain, no abdominal pain.  No acute concerns overnight per nursing staff. No acute concerns overnight per nursing staff.  Objective: Vitals:   06/06/21 0300 06/06/21 0454 06/06/21 0853 06/06/21 0856  BP: 131/66 (!) 100/59  135/65  Pulse:  82 (!) 115   Resp:    18  Temp:  98 F (36.7 C)    TempSrc:  Axillary    SpO2:  100%    Weight:      Height:       No intake or output data in the 24 hours ending 06/07/21 1131  Filed Weights   05/25/21 1745 05/26/21 1200  Weight: 61.2 kg 59.9 kg    Examination:  General exam: Appears calm, thin/cachectic/chronically ill in appearance Respiratory system: Breath sounds  decreased right base, no wheezing/crackles, normal respiratory effort, on 4 L nasal cannula with SPO2 100% at rest.   Cardiovascular system: S1 & S2 heard, RRR. No JVD, murmurs, rubs, gallops or clicks. No pedal edema. Gastrointestinal system: Abdomen is nondistended, soft and nontender. No organomegaly or masses felt. Normal bowel sounds heard. Central nervous system: Alert and oriented. No focal neurological deficits. Extremities: Symmetric 5 x 5 power. Skin: No rashes, lesions or ulcers Psychiatry: Judgement and insight appear poor. Depressed mood & flat affect.     Data Reviewed: I have personally reviewed following labs and imaging studies  CBC: Recent Labs  Lab 06/02/21 0314 06/03/21 0349 06/05/21 1001  WBC 7.9 6.6 5.5  HGB 10.4* 10.1* 10.1*  HCT 36.0 34.2* 34.8*  MCV 102.6* 102.1* 101.8*  PLT 256 210 010   Basic Metabolic Panel: Recent Labs  Lab 06/02/21 0314 06/03/21 0349  NA 144 146*  K 4.4 4.3  CL 95* 95*  CO2 44* 47*  GLUCOSE 142* 123*  BUN 32* 30*  CREATININE 0.57 0.65  CALCIUM 10.0 9.8  MG 2.1  --    GFR: Estimated Creatinine Clearance: 52.2 mL/min (by C-G formula based on SCr of 0.65 mg/dL). Liver Function Tests: No results for input(s): AST, ALT, ALKPHOS, BILITOT, PROT, ALBUMIN in the last 168 hours. No results for input(s): LIPASE, AMYLASE in the last 168 hours. No results for input(s): AMMONIA in the last 168 hours. Coagulation Profile: No results for input(s): INR, PROTIME in the last 168 hours.  Cardiac Enzymes: No results for input(s): CKTOTAL, CKMB, CKMBINDEX, TROPONINI in the last 168 hours. BNP (last 3 results) Recent Labs    03/30/21 1301  PROBNP 97.0   HbA1C: No results for input(s): HGBA1C in the last 72 hours. CBG: No results for input(s): GLUCAP in the last 168 hours. Lipid Profile: No results for input(s): CHOL, HDL, LDLCALC, TRIG, CHOLHDL, LDLDIRECT in the last 72 hours. Thyroid Function Tests: No results for input(s): TSH,  T4TOTAL, FREET4, T3FREE, THYROIDAB in the last 72 hours. Anemia Panel: No results for input(s): VITAMINB12, FOLATE, FERRITIN, TIBC, IRON, RETICCTPCT in the last 72 hours. Sepsis Labs: No results for input(s): PROCALCITON, LATICACIDVEN in the last 168 hours.  No results found for this or any previous visit (from the past 240 hour(s)).        Radiology Studies: DG CHEST PORT 1 VIEW  Result Date: 06/05/2021 CLINICAL DATA:  Short of breath.  Evaluate right-sided chest tube. EXAM: PORTABLE CHEST 1 VIEW COMPARISON:  Earlier today at 617 a.m. FINDINGS: 12:25 p.m. Midline trachea. Mild cardiomegaly. Atherosclerosis in the transverse aorta. Small right pleural effusion is similar no pneumothorax. Right base airspace disease remains. Right upper lobe scarring laterally. Displaced right rib fractures. IMPRESSION: No pneumothorax. Similar right pleural effusion and adjacent airspace disease. Aortic Atherosclerosis (ICD10-I70.0). Electronically Signed   By: Abigail Miyamoto M.D.   On: 06/05/2021 12:53         Scheduled Meds:  acetaminophen  1,000 mg Oral Q8H   apixaban  5 mg Oral BID   diltiazem  60 mg Oral Q6H   feeding supplement  237 mL Oral TID BM   lidocaine  1 patch Transdermal Q24H   loratadine  10 mg Oral Daily   mouth rinse  15 mL Mouth Rinse BID   metoprolol tartrate  50 mg Oral BID   pantoprazole  40 mg Oral Daily   Continuous Infusions:     LOS: 13 days    Time spent: 31 minutes spent on chart review, discussion with nursing staff, consultants, updating family and interview/physical exam; more than 50% of that time was spent in counseling and/or coordination of care.    Vonnie Spagnolo J [place] DO [facility] Available via Epic secure chat 7am-7pm After these hours, please refer to coverage provider listed on [email or web address] 06/07/2021, 11:31 AM

## 2021-06-07 NOTE — TOC Progression Note (Addendum)
Transition of Care [facility]) - Progression Note    Patient Details  Name: Melanie [name] MRN: [id number] Date of Birth: 17-Sep-1941  Transition of Care [facility]) CM/SW [place], [place] Phone Number: 06/07/2021, 10:40 AM  Clinical Narrative:     CSW spoke with [facility] with [facility] who confirmed no bed availability today for [facility]. CSW notified Bryson Ha with [facility] that patient will not longer need SNF bed.CSW will continue to follow and assist with discharge planning needs.  Expected Discharge Plan: [place] Barriers to Discharge: Continued Medical Work up  Expected Discharge Plan and Services Expected Discharge Plan: [place] In-house Referral: Clinical Social Work     Living arrangements for the past 2 months: Single Family Home                                       Social Determinants of Health (SDOH) Interventions    Readmission Risk Interventions Readmission Risk Prevention Plan 05/20/2021 05/20/2021  Post Dischage Appt Complete Complete  Medication Screening Complete Complete  Transportation Screening Complete Complete  Some recent data might be hidden

## 2021-06-07 NOTE — Progress Notes (Addendum)
Palliative Medicine Inpatient Follow Up Note  Reason for consult:  Goals of Care   HPI:  Per intake H&P --> Melanie [name] is a 80 y.o. female with medical history significant of COPD, multinodular goiter, essential hypertension, anxiety disorder, overactive bladder, A. fib with RVR, recent traumatic hemopneumothorax presented in the ED with bilateral leg swelling.     Patient was recently admitted to the [facility] trauma service from 05/11/21- 05/20/21 for right-sided rib fractures with hemopneumothorax following fall from a bed.  During hospitalization patient was intubated and then extubated, She had a right pigtail drain placed and removed and underwent IR thoracocentesis for right pleural effusion.  During that admission Patient has also developed A. fib with RVR which was evaluated by cardiology Dr. Einar Gip and placed on metoprolol and Cardizem with recommendation for outpatient follow-up.  She also required supplemental oxygen during that admission and was discharged home on 3 L of oxygen.   Daughter in law reports intermittent shortness of breath with exertion, and progressive increase in leg swelling after she is discharged from hospital.  Her oxygen requirement has increased to 4 L today. CT chest: No definite evidence of pulmonary embolus. Moderate right hydropneumothorax is noted with loculated effusionseen in the right lower lobe and associated atelectasis of the right lower lobe. Stable appearance of right segmental rib fractures and flail chest is noted as described on prior exam.   Of care has been asked to get involved in the setting of multiple chronic comorbid conditions to further define goals of care.  Today's Discussion (06/07/2021):  *Please note that this is a verbal dictation therefore any spelling or grammatical errors are due to the "Silvis One" system interpretation.  Chart reviewed.  Has required ativan x3 in the last 24 hours and fentanyl x2 in the last 24  hours.  Upon assessment this morning Melanie [name] was resting comfortably. She was in no distress.   Plan for transition to inpatient hospice once a bed becomes available. Melanie [name] has significant anxiety therefore I will add ativan ATC.   Ongoing family support continues to be provided during this difficult time.   Objective Assessment: Vital Signs Vitals:   06/06/21 0856 06/07/21 1207  BP: 135/65 (!) 167/80  Pulse:  70  Resp: 18   Temp:    SpO2:     No intake or output data in the 24 hours ending 06/07/21 1218  Last Weight  Most recent update: 05/26/2021 12:01 PM    Weight  59.9 kg (132 lb 0.9 oz)             Gen:  Elderly F in NAD HEENT: moist mucous membranes CV: Irregular rate and rhythm  PULM: On 4LPM [place]  ABD: soft/nontender  EXT: (+) muscle wasting Neuro: Oriented - incrementally somnolent  SUMMARY OF RECOMMENDATIONS   DNAR/DNI   Comfort care   Medications per MAR - Will add ativan atc for ongoing anxiety   Unrestricted visitation   TOC - Inpatient Hospice   Ongoing incremental PMT support   Time Spent: 25 Greater than 50% of the time was spent in counseling and coordination of care ______________________________________________________________________________________ [place] Team Team Cell Phone: [phone] Please utilize secure chat with additional questions, if there is no response within 30 minutes please call the above phone number  Palliative Medicine Team providers are available by phone from 7am to 7pm daily and can be reached through the team cell phone.  Should this patient require **Note De-identified [place] Obfuscation** assistance outside of these hours, please call the patient's attending physician.     

## 2021-06-08 MED ORDER — LIDOCAINE 5 % EX PTCH: 1.0000 | MEDICATED_PATCH | CUTANEOUS | 0 refills | Status: AC

## 2021-06-08 MED ORDER — DILTIAZEM HCL 60 MG PO TABS: 60.0000 mg | ORAL_TABLET | Freq: Four times a day (QID) | ORAL | Status: AC

## 2021-06-08 MED ORDER — ACETAMINOPHEN 500 MG PO TABS: 1000.0000 mg | ORAL_TABLET | Freq: Three times a day (TID) | ORAL | 0 refills | Status: AC

## 2021-06-08 MED ORDER — GLYCOPYRROLATE 1 MG PO TABS: 1.0000 mg | ORAL_TABLET | ORAL | Status: AC | PRN

## 2021-06-08 MED ORDER — LORAZEPAM 0.5 MG PO TABS: 0.5000 mg | ORAL_TABLET | Freq: Four times a day (QID) | ORAL | 0 refills | Status: AC | PRN

## 2021-06-08 NOTE — Care Management Important Message (Signed)
Important Message  Patient Details  Name: Melanie [name] MRN: [id number] Date of Birth: 02-27-1941   Medicare Important Message Given:  Yes     [name] 06/08/2021, 1:11 PM

## 2021-06-08 NOTE — TOC Transition Note (Addendum)
Transition of Care [facility]) - CM/SW Discharge Note   Patient Details  Name: Melanie [name] MRN: [id number] Date of Birth: 1941-01-03  Transition of Care [facility]) CM/SW Contact:  Trula Ore, [place] Phone Number: 06/08/2021, 12:58 PM   Clinical Narrative:     Patient will DC to: [place] date: 06/08/2021  Family notified: Joe   Transport by: Corey Harold  ?  Per MD patient ready for DC to [facility].Anderson [place] with [facility], RN, patient, patient's family, and facility notified of DC. RN given number for report tele#[phone]. DC packet on chart. DNR signed by MD attached to patients DC packet. Ambulance transport requested for patient.  CSW signing off.   Final next level of care: [place] ([place]) Barriers to Discharge: No Barriers Identified   Patient Goals and CMS Choice Patient states their goals for this hospitalization and ongoing recovery are:: to go to SNF CMS Medicare.gov Compare Post Acute Care list provided to:: Patient Represenative (must comment) Yolanda Bonine Joe) Choice offered to / list presented to : NA Milana Obey)  Discharge Placement              Patient chooses bed at:  [facility]) Patient to be transferred to facility by: [place] Name of family member notified: Joe Patient and family notified of of transfer: 06/08/21  Discharge Plan and Services In-house Referral: Clinical Social Work                                   Social Determinants of Health ([place]) Interventions     Readmission Risk Interventions Readmission Risk Prevention Plan 05/20/2021 05/20/2021  Post Dischage Appt Complete Complete  Medication Screening Complete Complete  Transportation Screening Complete Complete  Some recent data might be hidden

## 2021-06-08 NOTE — Progress Notes (Addendum)
Manufacturing engineer [facility])  [facility] has a bed to offer Ms. Nolde today.  Spoke with Joe to confirm they family is still interested in moving her to [facility]. He requests an hour to discuss with other family members and then will update this Probation officer if they want to proceed with moving her to [facility] for EOL care.  ACC will update attending and [facility] manager once family has decided.  Venia Carbon RN, BSN, [facility] Liaison   **spoke with Leia Alf, he confirms they would like the bed at [facility].   We can accept her at [facility] tonight after 9 pm.  RN staff, please call report to [phone] at any time, bed is assigned when report is called.

## 2021-06-08 NOTE — Discharge Summary (Signed)
Physician Discharge Summary  Melanie [name] JIR:[id number] DOB: 1941-11-24 DOA: 05/25/2021  PCP: Martinique, Betty G, MD  Admit date: 05/25/2021 Discharge date: 06/08/2021  Admitted From: Home Disposition: [facility]  Recommendations for Outpatient Follow-up:  Follow up hospice provider on arrival  Discharge Condition: Guarded/grim prognosis CODE STATUS: DNR Diet recommendation: Comfort feeds as tolerates  History of present illness:  Melanie [name] is a 80 year old female with past medical history significant for COPD, multinodular goiter, essential hypertension, anxiety disorder, overactive bladder, paroxysmal atrial fibrillation, recent traumatic hemopneumothorax who presented to the ED with bilateral lower extremity edema.  Also her oxygen requirement has increased to 4 L/min with CT scan in the ED showing no evidence of pulmonary embolism but with moderate right hydropneumothorax with loculated effusion right lower lobe with associated atelectasis.   Patient was recently admitted to [facility] trauma service from 05/11/2021 through 05/20/2021 following fall out of bed with subsequent right-sided rib fractures with hemopneumothorax.  During that hospitalization, patient was intubated, underwent IR thoracentesis, right pigtail drain placement which was removed prior to her discharge.  Patient also developed A. fib with RVR and started on metoprolol and Cardizem per cardiology, Dr. Einar Gip with outpatient follow-up recommended.  Patient was discharged on 3 L nasal cannula.  Hospital course:  Atrial fibrillation with RVR TTE 05/18/2021 limited by pain due to flail chest and hydropneumothorax but with grossly preserved LVEF. --Cardizem 60 mg p.o. 4 times daily --Metoprolol tartrate 50 mg p.o. twice daily --Eliquis for anticoagulation --Continue monitor on telemetry   Right leg DVT, recently diagnosed --Eliquis   Right-sided hydropneumothorax with loculated effusion,  recurrent Multiple right-sided rib fractures During previous hospitalization, patient had pigtail catheter placed which was removed prior to discharge.  Patient's O2 requirements have increased since discharge and CT chest notable for moderate right hemopneumothorax with associated loculated effusion/atelectasis.  Repeat chest x-ray 06/03/2021 shows right pneumothorax diminished in size.  Initial right-sided chest tube placed by IR on 05/26/2021, removed on 06/02/2021.  Apical chest tube placed by IR on 06/01/2021, removed on 06/04/2021.  Follow-up x-rays showing resolution of pneumothorax.  Given patient's significant decline, family has decided to transition her care to a more comfort approach.  We will be discharging to [facility] for further care.   Essential hypertension Discontinued home losartan.  Continue metoprolol tartrate and Cardizem for A. fib rate control.    Anxiety Lorazepam 0.5 mg p.o. every 6 hours as needed anxiety   Moderate protein calorie malnutrition Body mass index is 20.08 kg/m. Nutrition Status: Nutrition Problem: Moderate Malnutrition Etiology: chronic illness (COPD) Signs/Symptoms: moderate fat depletion, mild muscle depletion, severe muscle depletion Interventions: Ensure Enlive (each supplement provides 350kcal and 20 grams of protein), MVI Dietitian was consulted and followed during hospital course.  Patient diet was liberalized.  Continue comfort feeds as tolerates.   Adult failure to thrive Patient is very thin and cachectic in appearance.  Family report poor oral intake for some time.  Patient with moderate protein calorie malnutrition.  Patient with recent fall from bed sustaining multiple right-sided rib fractures with associated hemopneumothorax as above.  Family meeting with palliative care was performed on 06/01/2021.  Patient continues with gradual decline with poor oral intake, ultimately very poor/grim prognosis.  Discharging to [facility]  residential hospice today.  Discharge Diagnoses:  Principal Problem:   Atrial fibrillation with RVR ([place]) Active Problems:   Primary hypertension   Hypercholesteremia   Multinodular goiter   Overactive bladder  GERD (gastroesophageal reflux disease)   Peripheral neuropathy due to chemotherapy [facility])   Anxiety disorder   Closed fracture of multiple ribs with flail chest   Malnutrition of moderate degree    Discharge Instructions  Discharge Instructions     Diet - low sodium heart healthy   Complete by: As directed    Increase activity slowly   Complete by: As directed    No wound care   Complete by: As directed       Allergies as of 06/08/2021       Reactions   Latex Rash   Macrodantin Shortness Of Breath   Guaifenesin Er Itching   Morphine And Related Nausea Only   Ditropan [oxybutynin]    Causes diarrhea - contains Magnesium   Lipitor [atorvastatin]    rash   Lisinopril    Diarrhea/cough   Magnesium-containing Compounds    Causes diarrhea   Pravastatin    Leg cramps   Zoloft [sertraline Hcl]    Diarrhea        Medication List     STOP taking these medications    CALCIUM-VITAMIN D PO   cetirizine 10 MG tablet Commonly known as: ZYRTEC   docusate sodium 100 MG capsule Commonly known as: COLACE   fluticasone 50 MCG/ACT nasal spray Commonly known as: Flonase   hydrocortisone cream 1 %   losartan 100 MG tablet Commonly known as: COZAAR   oxyCODONE 5 MG immediate release tablet Commonly known as: Oxy IR/ROXICODONE   pantoprazole 20 MG tablet Commonly known as: PROTONIX   polyethylene glycol 17 g packet Commonly known as: MIRALAX / GLYCOLAX   VITAMIN D PO       TAKE these medications    acetaminophen 500 MG tablet Commonly known as: TYLENOL Take 2 tablets (1,000 mg total) by mouth every 8 (eight) hours. What changed: Another medication with the same name was removed. Continue taking this medication, and follow the directions you  see here.   diltiazem 60 MG tablet Commonly known as: CARDIZEM Take 1 tablet (60 mg total) by mouth every 6 (six) hours. What changed:  medication strength how much to take   glycopyrrolate 1 MG tablet Commonly known as: ROBINUL Take 1 tablet (1 mg total) by mouth every 4 (four) hours as needed (excessive secretions).   lidocaine 5 % Commonly known as: LIDODERM Place 1 patch onto the skin daily. Remove & Discard patch within 12 hours or as directed by MD   LORazepam 0.5 MG tablet Commonly known as: ATIVAN Take 1 tablet (0.5 mg total) by mouth every 6 (six) hours as needed for anxiety. What changed:  when to take this Another medication with the same name was removed. Continue taking this medication, and follow the directions you see here.   metoprolol tartrate 50 MG tablet Commonly known as: LOPRESSOR Take 1 tablet (50 mg total) by mouth 2 (two) times daily.   polyvinyl alcohol 1.4 % ophthalmic solution Commonly known as: LIQUIFILM TEARS Place 1 drop into both eyes as needed for dry eyes.        Follow-up Information     Cantwell, Celeste C, PA-C. Go on 06/24/2021.   Specialty: Cardiology Why: Appointment at 2:00pm please arrive 15 minutes early and bring all medicaitons with you.  Contact information: [place] [phone]                Allergies  Allergen Reactions   Latex Rash   Macrodantin  Shortness Of Breath   Guaifenesin Er Itching   Morphine And Related Nausea Only   Ditropan [Oxybutynin]     Causes diarrhea - contains Magnesium   Lipitor [Atorvastatin]     rash   Lisinopril     Diarrhea/cough   Magnesium-Containing Compounds     Causes diarrhea   Pravastatin     Leg cramps   Zoloft [Sertraline Hcl]     Diarrhea    Consultations: Palliative care Interventional radiology   Procedures/Studies: DG Chest 1 View  Result Date: 05/17/2021 CLINICAL DATA:  Status post right-sided thoracentesis EXAM:  CHEST  1 VIEW COMPARISON:  Film from earlier in the same day. FINDINGS: Significant reduction in right-sided pleural effusion is noted. Small residual effusion is seen. No pneumothorax is noted. Cardiac shadow is stable. Left lung remains clear. Multiple right-sided rib fractures are again noted. IMPRESSION: No pneumothorax following right-sided thoracentesis. Small residual effusion is seen. Electronically Signed   By: Inez Catalina M.D.   On: 05/17/2021 16:22   DG Chest 2 View  Addendum Date: 05/25/2021   ADDENDUM REPORT: 05/25/2021 13:45 ADDENDUM: Critical Value/emergent results were called by telephone at the time of interpretation on 05/25/2021 at 1:45 pm to provider Silverio Decamp, PA, who verbally acknowledged these results. Electronically Signed   By: Lowella Grip III M.D.   On: 05/25/2021 13:45   Result Date: 05/25/2021 CLINICAL DATA:  Shortness of breath EXAM: CHEST - 2 VIEW COMPARISON:  May 20, 2021. FINDINGS: There is a fairly small right apical pneumothorax without tension component. There are multiple displaced rib fractures on the right. There is a moderate pleural effusion on the right with atelectasis and potential consolidation in portions of the right middle and lower lobes. Left lung clear. Heart size and pulmonary vascularity are normal. No adenopathy. There is degenerative change in the thoracic spine. IMPRESSION: Multiple displaced rib fractures on the right with right pleural effusion and fairly small right apical pneumothorax. No tension component. Atelectasis with potential consolidation in portions of the right middle and lower lobes. Left lung clear. Heart size normal. Aortic Atherosclerosis (ICD10-I70.0). Electronically Signed: By: Lowella Grip III M.D. On: 05/25/2021 13:31   CT Head Wo Contrast  Result Date: 05/11/2021 CLINICAL DATA:  Fall, head injury, neck pain, right chest pain, flail chest, hypoxia abdominal injury EXAM: CT HEAD WITHOUT CONTRAST CT CERVICAL  SPINE WITHOUT CONTRAST CT CHEST, ABDOMEN AND PELVIS WITH CONTRAST TECHNIQUE: Contiguous axial images were obtained from the base of the skull through the vertex without intravenous contrast. Multidetector CT imaging of the cervical spine was performed without intravenous contrast. Multiplanar CT image reconstructions were also generated. Multidetector CT imaging of the chest, abdomen and pelvis was performed following the standard protocol during bolus administration of intravenous contrast. CONTRAST:  55mL OMNIPAQUE IOHEXOL 300 MG/ML  SOLN COMPARISON:  None. FINDINGS: CT HEAD FINDINGS Brain: Normal anatomic configuration. Parenchymal volume loss is commensurate with the patient's age. Moderate periventricular white matter changes are present likely reflecting the sequela of small vessel ischemia. No abnormal intra or extra-axial mass lesion or fluid collection. No abnormal mass effect or midline shift. No evidence of acute intracranial hemorrhage or infarct. Ventricular size is normal. Cerebellum unremarkable. Vascular: No asymmetric hyperdense vasculature at the skull base. Skull: Intact Sinuses/Orbits: Paranasal sinuses are clear. Orbits are unremarkable. Other: Mastoid air cells and middle ear cavities are clear. CT CERVICAL FINDINGS Alignment: Normal cervical lordosis.  No listhesis. Skull base and vertebrae: The craniocervical junction is unremarkable. The atlantodental interval is  not widened. 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 No acute fracture of the cervical spine. Soft tissues and spinal canal: There is extensive subcutaneous gas within the a soft tissues of the right neck. No prevertebral soft tissue swelling. No paraspinal fluid collections. No canal hematoma. The spinal canal is widely patent. Disc levels: There is intervertebral disc space narrowing and endplate remodeling at E1-7 and C6-7 in keeping with changes of moderate to severe degenerative disc disease. As  noted above, there is ankylosis of C3 and C4 anteriorly. Remaining intervertebral disc spaces are preserved. Vertebral body heights are preserved. The prevertebral soft tissues are not thickened. The spinal canal is widely patent on sagittal reformats. Axial images demonstrates multilevel uncovertebral and facet arthrosis resulting in moderate neuroforaminal narrowing on the left at C3-4 and C4-5. Other:  Thyroid goiter noted. CT CHEST FINDINGS Cardiovascular: No significant coronary artery calcification. Global cardiac size within normal limits. No pericardial effusion. Central pulmonary arteries are mildly enlarged in keeping with changes of pulmonary arterial hypertension. Moderate atherosclerotic calcification is seen within the thoracic aorta. No aortic aneurysm. Mediastinum/Nodes: Endotracheal tube seen 4.6 cm above the carina. Nasogastric tube extends into the distal body of the stomach. The esophagus is otherwise unremarkable. No pneumomediastinum. No mediastinal hematoma. No pathologic thoracic adenopathy. Multinodular thyroid goiter noted with nodules measuring up to 9 mm. Lungs/Pleura: Moderate right pneumothorax is present. No mediastinal shift to suggest tension physiology. Right small bore chest tube is in place which initially tracks within the minor fissure, but centrally appears to penetrate the right upper lobe pulmonary parenchyma. There is focal consolidation within the a dependent right lower lobe. Multiple small posttraumatic pneumatocele is a are seen within the lateral segment of the right lower lobe, best seen on image # 110. Small right mixed attenuation pleural effusion is present in keeping with a hemothorax. Mild centrilobular emphysema. Left lung is clear. No pneumothorax or pleural effusion on the left. Musculoskeletal: There are displaced fractures of the right sixth, seventh, eighth, ninth, and tenth ribs in 2 places resulting in a free-floating segment of the posterior thoracic  cage. Significantly displaced right eighth rib fracture fragment appears adjacent to the multiple traumatic pneumatocele seen within the right lower lobe. There is extensive subcutaneous gas within the right chest wall. CT ABDOMEN PELVIS FINDINGS Hepatobiliary: No focal liver abnormality is seen. No gallstones, gallbladder wall thickening, or biliary dilatation. Pancreas: Unremarkable Spleen: Unremarkable Adrenals/Urinary Tract: The adrenal glands are unremarkable. The kidneys are normal in size and position. Multiple simple cortical cysts are seen within the right kidney. The kidneys are otherwise unremarkable. The bladder is unremarkable save for mild pelvic descent with a resultant cystocele. Stomach/Bowel: Surgical changes of sigmoid colectomy, partial distal small-bowel resection, and appendectomy are identified. The residual stomach, small bowel, and large bowel are otherwise unremarkable Vascular/Lymphatic: Moderate aortoiliac atherosclerotic calcification. No aortic aneurysm. Retroaortic left renal vein. No pathologic adenopathy within the abdomen and pelvis. Reproductive: Status post hysterectomy. No adnexal masses. Other: Pelvic descent noted.  No abdominal wall hernia. Musculoskeletal: Advanced degenerative changes are seen at the lumbosacral junction with grade 1 anterolisthesis of a L4 upon L5 and mild retrolisthesis of L5 upon S1. There are chronic appearing erosive changes involving the L5 vertebral body with approximately 50% loss of height. No acute bone abnormality within the abdomen and pelvis. IMPRESSION: No acute intracranial injury.  No calvarial fracture. No acute fracture or listhesis of the cervical spine. Right flail chest with fractures of  the right 6-10 ribs in multiple locations resulting in a free-floating posterior thoracic cage segment. Displaced eighth rib fragment appears in close proximity to multiple traumatic pneumatocele is a within the right lower lobe. Moderate right hemo  pneumothorax. Right chest tube is in place but may penetrate the right upper lobe pulmonary parenchyma centrally. Right lower lobe posterior basal consolidation, likely posttraumatic in nature. Mild centrilobular emphysema. Morphologic changes in keeping with pulmonary arterial hypertension. No acute intra-abdominal injury. These results were called by telephone at the time of interpretation on 05/11/2021 at 2:06 am to provider [facility] , who verbally acknowledged these results. Aortic Atherosclerosis (ICD10-I70.0) and Emphysema (ICD10-J43.9). Electronically Signed   By: Fidela Salisbury MD   On: 05/11/2021 02:11   CT Angio Chest PE W/Cm &/Or Wo Cm  Result Date: 05/25/2021 CLINICAL DATA:  Shortness of breath. EXAM: CT ANGIOGRAPHY CHEST WITH CONTRAST TECHNIQUE: Multidetector CT imaging of the chest was performed using the standard protocol during bolus administration of intravenous contrast. Multiplanar CT image reconstructions and MIPs were obtained to evaluate the vascular anatomy. CONTRAST:  53mL OMNIPAQUE IOHEXOL 350 MG/ML SOLN COMPARISON:  May 11, 2021. FINDINGS: Cardiovascular: Satisfactory opacification of the pulmonary arteries to the segmental level. No evidence of pulmonary embolism. Normal heart size. No pericardial effusion. Atherosclerosis of thoracic aorta is noted without aneurysm or dissection. Mediastinum/Nodes: Stable findings consistent with multinodular goiter. No adenopathy is noted. Esophagus is unremarkable. Lungs/Pleura: Left lung is clear. Moderate right hydropneumothorax is noted with loculated effusion seen in the right lower lobe and associated atelectasis of the right lower lobe. Upper Abdomen: No acute abnormality. Musculoskeletal: Stable appearance of right segmental rib fractures and flail chest as noted on prior exam. Review of the MIP images confirms the above findings. IMPRESSION: No definite evidence of pulmonary embolus. Moderate right hydropneumothorax is noted with  loculated effusion seen in the right lower lobe and associated atelectasis of the right lower lobe. Stable appearance of right segmental rib fractures and flail chest is noted as described on prior exam. Aortic Atherosclerosis (ICD10-I70.0). Electronically Signed   By: Marijo Conception M.D.   On: 05/25/2021 16:38   CT Cervical Spine Wo Contrast  Result Date: 05/11/2021 CLINICAL DATA:  Fall, head injury, neck pain, right chest pain, flail chest, hypoxia abdominal injury EXAM: CT HEAD WITHOUT CONTRAST CT CERVICAL SPINE WITHOUT CONTRAST CT CHEST, ABDOMEN AND PELVIS WITH CONTRAST TECHNIQUE: Contiguous axial images were obtained from the base of the skull through the vertex without intravenous contrast. Multidetector CT imaging of the cervical spine was performed without intravenous contrast. Multiplanar CT image reconstructions were also generated. Multidetector CT imaging of the chest, abdomen and pelvis was performed following the standard protocol during bolus administration of intravenous contrast. CONTRAST:  68mL OMNIPAQUE IOHEXOL 300 MG/ML  SOLN COMPARISON:  None. FINDINGS: CT HEAD FINDINGS Brain: Normal anatomic configuration. Parenchymal volume loss is commensurate with the patient's age. Moderate periventricular white matter changes are present likely reflecting the sequela of small vessel ischemia. No abnormal intra or extra-axial mass lesion or fluid collection. No abnormal mass effect or midline shift. No evidence of acute intracranial hemorrhage or infarct. Ventricular size is normal. Cerebellum unremarkable. Vascular: No asymmetric hyperdense vasculature at the skull base. Skull: Intact Sinuses/Orbits: Paranasal sinuses are clear. Orbits are unremarkable. Other: Mastoid air cells and middle ear cavities are clear. CT CERVICAL FINDINGS Alignment: Normal cervical lordosis.  No listhesis. Skull base and vertebrae: The craniocervical junction is unremarkable. The atlantodental interval is not widened. There  is  ankylosis of the vertebral bodies and facets of C3 and C4. No acute fracture of the cervical spine. Soft tissues and spinal canal: There is extensive subcutaneous gas within the a soft tissues of the right neck. No prevertebral soft tissue swelling. No paraspinal fluid collections. No canal hematoma. The spinal canal is widely patent. Disc levels: There is intervertebral disc space narrowing and endplate remodeling at Q6-5 and C6-7 in keeping with changes of moderate to severe degenerative disc disease. 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 Remaining intervertebral disc spaces are preserved. Vertebral body heights are preserved. The prevertebral soft tissues are not thickened. The spinal canal is widely patent on sagittal reformats. Axial images demonstrates multilevel uncovertebral and facet arthrosis resulting in moderate neuroforaminal narrowing on the left at C3-4 and C4-5. Other:  Thyroid goiter noted. CT CHEST FINDINGS Cardiovascular: No significant coronary artery calcification. Global cardiac size within normal limits. No pericardial effusion. Central pulmonary arteries are mildly enlarged in keeping with changes of pulmonary arterial hypertension. Moderate atherosclerotic calcification is seen within the thoracic aorta. No aortic aneurysm. Mediastinum/Nodes: Endotracheal tube seen 4.6 cm above the carina. Nasogastric tube extends into the distal body of the stomach. The esophagus is otherwise unremarkable. No pneumomediastinum. No mediastinal hematoma. No pathologic thoracic adenopathy. Multinodular thyroid goiter noted with nodules measuring up to 9 mm. Lungs/Pleura: Moderate right pneumothorax is present. No mediastinal shift to suggest tension physiology. Right small bore chest tube is in place which initially tracks within the minor fissure, but centrally appears to penetrate the right upper lobe pulmonary parenchyma. There is focal consolidation within the a dependent right  lower lobe. Multiple small posttraumatic pneumatocele is a are seen within the lateral segment of the right lower lobe, best seen on image # 110. Small right mixed attenuation pleural effusion is present in keeping with a hemothorax. Mild centrilobular emphysema. Left lung is clear. No pneumothorax or pleural effusion on the left. Musculoskeletal: There are displaced fractures of the right sixth, seventh, eighth, ninth, and tenth ribs in 2 places resulting in a free-floating segment of the posterior thoracic cage. Significantly displaced right eighth rib fracture fragment appears adjacent to the multiple traumatic pneumatocele seen within the right lower lobe. There is extensive subcutaneous gas within the right chest wall. CT ABDOMEN PELVIS FINDINGS Hepatobiliary: No focal liver abnormality is seen. No gallstones, gallbladder wall thickening, or biliary dilatation. Pancreas: Unremarkable Spleen: Unremarkable Adrenals/Urinary Tract: The adrenal glands are unremarkable. The kidneys are normal in size and position. Multiple simple cortical cysts are seen within the right kidney. The kidneys are otherwise unremarkable. The bladder is unremarkable save for mild pelvic descent with a resultant cystocele. Stomach/Bowel: Surgical changes of sigmoid colectomy, partial distal small-bowel resection, and appendectomy are identified. The residual stomach, small bowel, and large bowel are otherwise unremarkable Vascular/Lymphatic: Moderate aortoiliac atherosclerotic calcification. No aortic aneurysm. Retroaortic left renal vein. No pathologic adenopathy within the abdomen and pelvis. Reproductive: Status post hysterectomy. No adnexal masses. Other: Pelvic descent noted.  No abdominal wall hernia. Musculoskeletal: Advanced degenerative changes are seen at the lumbosacral junction with grade 1 anterolisthesis of a L4 upon L5 and mild retrolisthesis of L5 upon S1. There are chronic appearing erosive changes involving the L5  vertebral body with approximately 50% loss of height. No acute bone abnormality within the abdomen and pelvis. IMPRESSION: No acute intracranial injury.  No calvarial fracture. No acute fracture or listhesis of the cervical spine. Right flail chest with fractures of the right 6-10 ribs  in multiple locations resulting in a free-floating posterior thoracic cage segment. Displaced eighth rib fragment appears in close proximity to multiple traumatic pneumatocele is a within the right lower lobe. Moderate right hemo pneumothorax. Right chest tube is in place but may penetrate the right upper lobe pulmonary parenchyma centrally. Right lower lobe posterior basal consolidation, likely posttraumatic in nature. Mild centrilobular emphysema. Morphologic changes in keeping with pulmonary arterial hypertension. No acute intra-abdominal injury. These results were called by telephone at the time of interpretation on 05/11/2021 at 2:06 am to provider [facility] , who verbally acknowledged these results. Aortic Atherosclerosis (ICD10-I70.0) and Emphysema (ICD10-J43.9). Electronically Signed   By: Fidela Salisbury MD   On: 05/11/2021 02:11   DG Pelvis Portable  Result Date: 05/11/2021 CLINICAL DATA:  Fall, right hip pain EXAM: PORTABLE PELVIS 1-2 VIEWS COMPARISON:  None. FINDINGS: There is no evidence of pelvic fracture or diastasis. No pelvic bone lesions are seen. IMPRESSION: Negative. Electronically Signed   By: Fidela Salisbury MD   On: 05/11/2021 02:11   CT CHEST ABDOMEN PELVIS W CONTRAST  Result Date: 05/11/2021 CLINICAL DATA:  Fall, head injury, neck pain, right chest pain, flail chest, hypoxia abdominal injury EXAM: CT HEAD WITHOUT CONTRAST CT CERVICAL SPINE WITHOUT CONTRAST CT CHEST, ABDOMEN AND PELVIS WITH CONTRAST TECHNIQUE: Contiguous axial images were obtained from the base of the skull through the vertex without intravenous contrast. Multidetector CT imaging of the cervical spine was performed without  intravenous contrast. Multiplanar CT image reconstructions were also generated. Multidetector CT imaging of the chest, abdomen and pelvis was performed following the standard protocol during bolus administration of intravenous contrast. CONTRAST:  62mL OMNIPAQUE IOHEXOL 300 MG/ML  SOLN COMPARISON:  None. FINDINGS: CT HEAD FINDINGS Brain: Normal anatomic configuration. Parenchymal volume loss is commensurate with the patient's age. Moderate periventricular white matter changes are present likely reflecting the sequela of small vessel ischemia. No abnormal intra or extra-axial mass lesion or fluid collection. No abnormal mass effect or midline shift. No evidence of acute intracranial hemorrhage or infarct. Ventricular size is normal. Cerebellum unremarkable. Vascular: No asymmetric hyperdense vasculature at the skull base. Skull: Intact Sinuses/Orbits: Paranasal sinuses are clear. Orbits are unremarkable. Other: Mastoid air cells and middle ear cavities are clear. CT CERVICAL FINDINGS Alignment: Normal cervical lordosis.  No listhesis. Skull base and vertebrae: The craniocervical junction is unremarkable. The atlantodental interval is not widened. 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 No acute fracture of the cervical spine. Soft tissues and spinal canal: There is extensive subcutaneous gas within the a soft tissues of the right neck. No prevertebral soft tissue swelling. No paraspinal fluid collections. No canal hematoma. The spinal canal is widely patent. Disc levels: There is intervertebral disc space narrowing and endplate remodeling at D1-4 and C6-7 in keeping with changes of moderate to severe degenerative disc disease. 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 Remaining intervertebral disc spaces are preserved. Vertebral body heights are preserved. The prevertebral soft tissues are not thickened. The spinal canal is widely patent on sagittal reformats. Axial  images demonstrates multilevel uncovertebral and facet arthrosis resulting in moderate neuroforaminal narrowing on the left at C3-4 and C4-5. Other:  Thyroid goiter noted. CT CHEST FINDINGS Cardiovascular: No significant coronary artery calcification. Global cardiac size within normal limits. No pericardial effusion. Central pulmonary arteries are mildly enlarged in keeping with changes of pulmonary arterial hypertension. Moderate atherosclerotic calcification is seen within the thoracic aorta. No aortic  aneurysm. Mediastinum/Nodes: Endotracheal tube seen 4.6 cm above the carina. Nasogastric tube extends into the distal body of the stomach. The esophagus is otherwise unremarkable. No pneumomediastinum. No mediastinal hematoma. No pathologic thoracic adenopathy. Multinodular thyroid goiter noted with nodules measuring up to 9 mm. Lungs/Pleura: Moderate right pneumothorax is present. No mediastinal shift to suggest tension physiology. Right small bore chest tube is in place which initially tracks within the minor fissure, but centrally appears to penetrate the right upper lobe pulmonary parenchyma. There is focal consolidation within the a dependent right lower lobe. Multiple small posttraumatic pneumatocele is a are seen within the lateral segment of the right lower lobe, best seen on image # 110. Small right mixed attenuation pleural effusion is present in keeping with a hemothorax. Mild centrilobular emphysema. Left lung is clear. No pneumothorax or pleural effusion on the left. Musculoskeletal: There are displaced fractures of the right sixth, seventh, eighth, ninth, and tenth ribs in 2 places resulting in a free-floating segment of the posterior thoracic cage. Significantly displaced right eighth rib fracture fragment appears adjacent to the multiple traumatic pneumatocele seen within the right lower lobe. There is extensive subcutaneous gas within the right chest wall. CT ABDOMEN PELVIS FINDINGS Hepatobiliary:  No focal liver abnormality is seen. No gallstones, gallbladder wall thickening, or biliary dilatation. Pancreas: Unremarkable Spleen: Unremarkable Adrenals/Urinary Tract: The adrenal glands are unremarkable. The kidneys are normal in size and position. Multiple simple cortical cysts are seen within the right kidney. The kidneys are otherwise unremarkable. The bladder is unremarkable save for mild pelvic descent with a resultant cystocele. Stomach/Bowel: Surgical changes of sigmoid colectomy, partial distal small-bowel resection, and appendectomy are identified. The residual stomach, small bowel, and large bowel are otherwise unremarkable Vascular/Lymphatic: Moderate aortoiliac atherosclerotic calcification. No aortic aneurysm. Retroaortic left renal vein. No pathologic adenopathy within the abdomen and pelvis. Reproductive: Status post hysterectomy. No adnexal masses. Other: Pelvic descent noted.  No abdominal wall hernia. Musculoskeletal: Advanced degenerative changes are seen at the lumbosacral junction with grade 1 anterolisthesis of a L4 upon L5 and mild retrolisthesis of L5 upon S1. There are chronic appearing erosive changes involving the L5 vertebral body with approximately 50% loss of height. No acute bone abnormality within the abdomen and pelvis. IMPRESSION: No acute intracranial injury.  No calvarial fracture. No acute fracture or listhesis of the cervical spine. Right flail chest with fractures of the right 6-10 ribs in multiple locations resulting in a free-floating posterior thoracic cage segment. Displaced eighth rib fragment appears in close proximity to multiple traumatic pneumatocele is a within the right lower lobe. Moderate right hemo pneumothorax. Right chest tube is in place but may penetrate the right upper lobe pulmonary parenchyma centrally. Right lower lobe posterior basal consolidation, likely posttraumatic in nature. Mild centrilobular emphysema. Morphologic changes in keeping with  pulmonary arterial hypertension. No acute intra-abdominal injury. These results were called by telephone at the time of interpretation on 05/11/2021 at 2:06 am to provider [facility] , who verbally acknowledged these results. Aortic Atherosclerosis (ICD10-I70.0) and Emphysema (ICD10-J43.9). Electronically Signed   By: Fidela Salisbury MD   On: 05/11/2021 02:11   DG CHEST PORT 1 VIEW  Result Date: 06/05/2021 CLINICAL DATA:  Short of breath.  Evaluate right-sided chest tube. EXAM: PORTABLE CHEST 1 VIEW COMPARISON:  Earlier today at 617 a.m. FINDINGS: 12:25 p.m. Midline trachea. Mild cardiomegaly. Atherosclerosis in the transverse aorta. Small right pleural effusion is similar no pneumothorax. Right base airspace disease remains. Right upper lobe scarring laterally. Displaced right  rib fractures. IMPRESSION: No pneumothorax. Similar right pleural effusion and adjacent airspace disease. Aortic Atherosclerosis (ICD10-I70.0). Electronically Signed   By: Abigail Miyamoto M.D.   On: 06/05/2021 12:53   DG Chest Port 1 View  Result Date: 06/05/2021 CLINICAL DATA:  Follow-up pleural effusion. EXAM: PORTABLE CHEST 1 VIEW COMPARISON:  06/04/2021 FINDINGS: The right chest tube is stable. No recurrent pneumothorax is identified. Persistent loculated right basilar pleural effusion with overlying atelectasis. No definite left-sided effusion. IMPRESSION: Stable right chest tube. No recurrent pneumothorax. Persistent right effusion and overlying atelectasis. Electronically Signed   By: Marijo Sanes M.D.   On: 06/05/2021 09:51   DG Chest Port 1 View  Result Date: 06/04/2021 CLINICAL DATA:  Chest tube EXAM: PORTABLE CHEST 1 VIEW COMPARISON:  Portable exam 1016 hours compared to 06/02/2021 FINDINGS: Pigtail RIGHT thoracostomy tube again identified. Normal heart size, mediastinal contours, and pulmonary vascularity. Atherosclerotic calcification aorta. RIGHT pleural effusion without residual pneumothorax. Underlying  emphysematous changes. Bones demineralized. IMPRESSION: RIGHT thoracostomy tube without pneumothorax. Emphysematous changes with small RIGHT pleural effusion. Aortic Atherosclerosis (ICD10-I70.0) and Emphysema (ICD10-J43.9). Electronically Signed   By: Lavonia Dana M.D.   On: 06/04/2021 12:37   DG Chest Port 1 View  Result Date: 06/02/2021 CLINICAL DATA:  Follow-up pneumothorax. EXAM: PORTABLE CHEST 1 VIEW COMPARISON:  Radiograph earlier today.  CT 05/25/2021 FINDINGS: Right pigtail catheter coursing near the apex. Previous pigtail catheter at the right lung base is not definitively visualized. Previous pneumothorax has diminished without residual visualized. Suspected small pleural effusion at the right lung base. Stable heart size and mediastinal contours with aortic atherosclerosis. Similar left basilar atelectasis. Right rib fractures again seen. IMPRESSION: 1. Upper right pigtail catheter remain in place. Previous pigtail catheter at the right lung base is no longer visualized. Right pneumothorax has diminished in size from prior exam, without residual. 2. Suspected small right pleural effusion. Similar left basilar atelectasis. Electronically Signed   By: Keith Rake M.D.   On: 06/02/2021 19:29   DG CHEST PORT 1 VIEW  Result Date: 06/02/2021 CLINICAL DATA:  Shortness of breath.  Chest tubes. EXAM: PORTABLE CHEST 1 VIEW COMPARISON:  06/01/2021.  05/31/2021.  CT 05/25/2021. FINDINGS: Two right chest tubes are noted over the right chest. Right lower chest tube side holes may be within the right chest wall. Persistent right apical pneumothorax noted. Atelectatic changes in the right lung again noted. Mild atelectatic changes left lung base. Stable cardiomegaly. Multiple right rib fractures again noted. Degenerative changes thoracic spine. IMPRESSION: 1. Two right chest tubes are noted over the right chest. Right lower chest tube side holes may be within the right chest wall. Persistent right apical  pneumothorax noted. 2. Atelectatic changes in the right lung again noted. Mild atelectatic changes left lung base. 3.  Stable cardiomegaly. 4.  Multiple right rib fractures again noted. Electronically Signed   By: Marcello Moores  Register   On: 06/02/2021 06:46   DG Chest Port 1 View  Result Date: 05/31/2021 CLINICAL DATA:  Chest tube in place. EXAM: PORTABLE CHEST 1 VIEW COMPARISON:  May 30, 2021. FINDINGS: The heart size and mediastinal contours are within normal limits. Right-sided chest tube is again noted. Mild right apical pneumothorax is noted. Mild bibasilar subsegmental atelectasis is noted. Multiple displaced right rib fractures are again noted. IMPRESSION: Right-sided chest tube is again noted. Mild right apical pneumothorax is now noted. Multiple displaced right rib fractures are again noted. Aortic Atherosclerosis (ICD10-I70.0). Electronically Signed   By: Bobbe Medico.D.  On: 05/31/2021 08:21   DG Chest Port 1 View  Result Date: 05/30/2021 CLINICAL DATA:  Chest tube EXAM: PORTABLE CHEST 1 VIEW COMPARISON:  05/29/2021 FINDINGS: Small bore RIGHT chest tube in place. RIGHT apical pneumothorax not appreciated on today's exam. Mild atelectasis and effusion at the RIGHT lung base. Multiple posterior RIGHT rib fractures noted. LEFT lung clear. IMPRESSION: 1. Chest tube in place on the RIGHT with no pneumothorax appreciated. 2. Multiple posterior RIGHT rib fractures and RIGHT basilar atelectasis. Electronically Signed   By: Suzy Bouchard M.D.   On: 05/30/2021 07:38   DG Chest Port 1 View  Result Date: 05/29/2021 CLINICAL DATA:  Shortness of breath EXAM: PORTABLE CHEST 1 VIEW COMPARISON:  May 29, 2021 study obtained earlier in the day. FINDINGS: Chest tube again noted on the right with small right apical pneumothorax, stable. There are multiple displaced rib fractures on the right. There is ill-defined airspace opacity in the right lower lung region which likely represents an area of contusion with  potential associated loculated pleural fluid. No new opacity evident on either side. Left lung clear. Heart upper normal in size with pulmonary vascularity normal. No adenopathy. There is aortic atherosclerosis. IMPRESSION: Stable chest tube placement on the right with small right apical pneumothorax, stable. No tension component. Opacity right base laterally likely represents combination of loculated fluid and contusion. Note multiple displaced rib fractures on the right. No new opacity evident. Stable cardiac silhouette. Aortic Atherosclerosis (ICD10-I70.0). Electronically Signed   By: Lowella Grip III M.D.   On: 05/29/2021 11:50   DG Chest Port 1 View  Result Date: 05/29/2021 CLINICAL DATA:  Short of breath. RIGHT chest tube placement. Multiple RIGHT rib fractures. EXAM: PORTABLE CHEST 1 VIEW COMPARISON:  CT 05/25/2021, chest radiograph [id number] FINDINGS: Stable cardiac silhouette. Small bore RIGHT chest tube in place. Decrease in RIGHT pleural fluid volume. Small RIGHT apical pneumothorax remains unchanged in volume measuring approximately 6 mm from the apical chest wall. Multiple posterior RIGHT rib fractures again noted. IMPRESSION: 1. RIGHT chest tube in place with small apical pneumothorax. No change in pneumothorax volume. 2. Decrease in RIGHT pleural fluid. 3. Multiple posterior RIGHT rib fractures. Electronically Signed   By: Suzy Bouchard M.D.   On: 05/29/2021 09:17   DG Chest Port 1 View  Result Date: 05/28/2021 CLINICAL DATA:  Chest tube EXAM: PORTABLE CHEST 1 VIEW COMPARISON:  05/27/2021 FINDINGS: Right basilar chest tube is present. Similar residual right apical pneumothorax. Similar small right pleural effusion and right basilar atelectasis/consolidation. Stable cardiomediastinal contours. IMPRESSION: Similar residual right apical pneumothorax with basilar chest tube present. Similar small right pleural effusion and right basilar atelectasis/consolidation. Electronically Signed   By:  Macy Mis M.D.   On: 05/28/2021 08:09   DG CHEST PORT 1 VIEW  Result Date: 05/27/2021 CLINICAL DATA:  Pneumothorax. EXAM: PORTABLE CHEST 1 VIEW COMPARISON:  May 25, 2021. FINDINGS: Stable cardiomediastinal silhouette. Interval placement of pigtail drainage catheter in the right lung base. Right pleural effusion is significantly smaller compared to prior exam. Grossly stable small right apical pneumothorax is noted. Bony thorax is unremarkable. IMPRESSION: Interval placement of pigtail drainage catheter in right lung base. Right pleural effusion is significantly smaller. Grossly stable small right apical pneumothorax. Aortic Atherosclerosis (ICD10-I70.0). Electronically Signed   By: Marijo Conception M.D.   On: 05/27/2021 08:59   DG CHEST PORT 1 VIEW  Result Date: 05/20/2021 CLINICAL DATA:  Pneumothorax.  Rib fractures. EXAM: PORTABLE CHEST 1 VIEW COMPARISON:  05/19/2021.  CT 05/11/2021. FINDINGS: Mediastinum and hilar structures normal. Heart size normal. Persistent right base atelectasis/infiltrate. Small right pleural effusion again noted. No pneumothorax noted on today's exam. Thoracic fractures best identified by prior CT. IMPRESSION: Persistent right base atelectasis/infiltrate. Small right pleural effusion again noted. No pneumothorax noted on today's exam. Electronically Signed   By: Marcello Moores  Register   On: 05/20/2021 07:07   DG CHEST PORT 1 VIEW  Result Date: 05/19/2021 CLINICAL DATA:  Shortness of breath.  Rib fracture. EXAM: PORTABLE CHEST 1 VIEW COMPARISON:  Chest x-ray 05/18/2021, CT chest 05/11/2021 FINDINGS: The heart size and mediastinal contours are within normal limits. Biapical pleural/pulmonary scarring. No focal consolidation. No pulmonary edema. Interval increase in trace to small volume right pleural effusion. Redemonstration of right mid to lower lung zone airspace opacity. Persistent trace right pneumothorax. No left pneumothorax. No acute osseous abnormality. IMPRESSION: 1.  Interval increase in trace to small volume right pleural fluid. 2. Persistent trace right pneumothorax. 3. Right mid to lower lung zone airspace opacity again noted. Electronically Signed   By: Iven Finn M.D.   On: 05/19/2021 05:47   DG CHEST PORT 1 VIEW  Result Date: 05/18/2021 CLINICAL DATA:  Shortness of breath. EXAM: PORTABLE CHEST 1 VIEW COMPARISON:  05/17/2021. FINDINGS: Mediastinum and hilar structures normal. Right base atelectasis/infiltrate. Small right pleural effusion, improved from prior exam. Miniscule right apical pneumothorax cannot be excluded. Heart size stable. Multiple right rib fractures. Degenerative change thoracic spine. IMPRESSION: 1. Right base atelectasis/infiltrate. Small right pleural effusion, improved from prior exam. 2. Miniscule right apical pneumothorax cannot be excluded, follow-up on subsequent exams suggested. 3.  Multiple right rib fractures again noted. These results will be called to the ordering clinician or representative by the Radiologist Assistant, and communication documented in the PACS or [employer]. Electronically Signed   By: Marcello Moores  Register   On: 05/18/2021 05:24   DG CHEST PORT 1 VIEW  Result Date: 05/17/2021 CLINICAL DATA:  Shortness of breath EXAM: PORTABLE CHEST 1 VIEW COMPARISON:  05/16/2021 FINDINGS: Cardiac shadow is stable. Aortic calcifications are again seen. Left lung is clear. Right sided pleural effusion is again noted with underlying atelectasis. The overall appearance is stable from the prior study. IMPRESSION: No change in right effusion and focal infiltrate/atelectasis. Electronically Signed   By: Inez Catalina M.D.   On: 05/17/2021 11:57   DG CHEST PORT 1 VIEW  Result Date: 05/16/2021 CLINICAL DATA:  Shortness of breath EXAM: PORTABLE CHEST 1 VIEW COMPARISON:  05/15/2021 FINDINGS: Moderate right pleural effusion with right lower lobe consolidation. Left lung clear. Heart is normal size. Aorta calcifications. No acute bony  abnormality. No pneumothorax. IMPRESSION: Moderate right pleural effusion with right lower lobe infiltrate. No change. Electronically Signed   By: Rolm Baptise M.D.   On: 05/16/2021 14:14   DG CHEST PORT 1 VIEW  Result Date: 05/15/2021 CLINICAL DATA:  Chest tube removed, pneumothorax EXAM: PORTABLE CHEST 1 VIEW COMPARISON:  05/15/2021 FINDINGS: Interval removal of right chest tube. No visible pneumothorax. Moderate right pleural effusion with right lower lobe airspace opacities, unchanged. Left lung clear. Heart is normal size. IMPRESSION: Interval removal of right chest tube without visible pneumothorax. Continued moderate right effusion with right lower lobe atelectasis or infiltrate. Electronically Signed   By: Rolm Baptise M.D.   On: 05/15/2021 15:52   DG CHEST PORT 1 VIEW  Result Date: 05/15/2021 CLINICAL DATA:  Pneumothorax. EXAM: PORTABLE CHEST 1 VIEW COMPARISON:  May 14, 2021 FINDINGS: The right-sided chest tube is  stable. A small right apical pneumothorax is stable. Effusion with underlying opacity on the right is stable. The cardiomediastinal silhouette is unchanged. The left lung remains well aerated. No other changes. IMPRESSION: 1. Stable right chest tube and small right apical pneumothorax. Stable right pleural effusion with underlying opacity, likely atelectasis. Electronically Signed   By: Dorise Bullion III M.D   On: 05/15/2021 08:11   DG CHEST PORT 1 VIEW  Result Date: 05/14/2021 CLINICAL DATA:  Pneumothorax EXAM: PORTABLE CHEST 1 VIEW COMPARISON:  05/13/2021 FINDINGS: Pulmonary insufflation is stable. Right-sided pigtail chest tube is unchanged in position. Tiny right apical pneumothorax has decreased in size, now nearly completely resolved. Moderate right pleural effusion with associated right basilar compressive atelectasis and superimposed right basilar pulmonary infiltrate are unchanged. Left lung is clear. No pneumothorax or pleural effusion on the left. Multiple acute right rib  fractures again noted. Cardiac size within normal limits. Pulmonary vascularity is normal. IMPRESSION: Decreasing, miniscule right apical pneumothorax, now nearly completely resolved. Right chest tube unchanged. Stable moderate right pleural effusion and associated right basilar atelectasis and infiltrate. Electronically Signed   By: Fidela Salisbury MD   On: 05/14/2021 05:15   DG CHEST PORT 1 VIEW  Result Date: 05/13/2021 CLINICAL DATA:  Pneumothorax. EXAM: PORTABLE CHEST 1 VIEW COMPARISON:  05/12/2021. FINDINGS: Mediastinum and hilar structures are normal. Heart size stable. Right chest tube in stable position. Improved right apical pneumothorax with mild residual. Persistent right base atelectasis/infiltrate and right-sided pleural effusion. No interim change. Multiple right rib fractures again noted. Right chest wall subcutaneous emphysema again noted. IMPRESSION: 1. Right chest tube in stable position. Improved right apical pneumothorax with mild residual. 2. Persistent right base atelectasis/infiltrate right-sided pleural effusion. No interim change. 3. Multiple right rib fractures again noted. Right chest wall subcutaneous emphysema again noted. Electronically Signed   By: Marcello Moores  Register   On: 05/13/2021 05:21   DG CHEST PORT 1 VIEW  Result Date: 05/12/2021 CLINICAL DATA:  Right pneumothorax EXAM: PORTABLE CHEST 1 VIEW COMPARISON:  05/11/2021 FINDINGS: Interval extubation and removal of the nasogastric tube. Pulmonary insufflation is preserved. Right mid lung zone pigtail chest tube is unchanged. Small right apical pneumothorax is unchanged. Enlarging, small to moderate right pleural effusion is present with increasing right basilar compressive atelectasis. Left lung is clear. No pneumothorax or pleural effusion on the left. Cardiac size within normal limits. Pulmonary vascularity is normal. Multiple acute right rib fractures are again identified. Subcutaneous gas within the right chest wall appears  stable. IMPRESSION: Interval extubation with preservation of pulmonary insufflation. Right chest tube in place. Stable small right apical pneumothorax. Enlarging right basilar small to moderate pleural effusion. Multiple acute right rib fractures again identified. Electronically Signed   By: Fidela Salisbury MD   On: 05/12/2021 05:10   DG Chest Port 1 View  Result Date: 05/11/2021 CLINICAL DATA:  Chest tube placement EXAM: PORTABLE CHEST 1 VIEW COMPARISON:  05/11/2021 FINDINGS: Endotracheal tube is 4.4 cm above the carina. Nasogastric tube extends into the upper abdomen beyond the margin of the examination. Pigtail chest tube overlies the right mid lung zone. Small right apical pneumothorax persists, likely stable when accounting for changes in patient positioning. Moderate subcutaneous gas noted within the right chest wall. Multiple acute right rib fractures again noted. Mild right basilar atelectasis or infiltrate persists. Left lung is clear. No pneumothorax or pleural effusion on the left. Cardiac size within normal limits. IMPRESSION: Stable support lines and tubes. Stable small right apical pneumothorax.  Right  chest tube in place. Unchanged mild right basilar atelectasis or infiltrate. Multiple acute right rib fractures. Electronically Signed   By: Fidela Salisbury MD   On: 05/11/2021 06:37   DG Chest Portable 1 View  Result Date: 05/11/2021 CLINICAL DATA:  Trauma EXAM: PORTABLE CHEST 1 VIEW COMPARISON:  05/11/2021 FINDINGS: There is a pigtail catheter in the right chest. Unchanged size of right pneumothorax. Multiple right-sided rib fractures again seen. IMPRESSION: Unchanged size of right pneumothorax status post chest tube placement. Electronically Signed   By: Ulyses Jarred M.D.   On: 05/11/2021 02:37   DG Chest Port 1 View  Result Date: 05/11/2021 CLINICAL DATA:  Level 1 trauma EXAM: PORTABLE CHEST 1 VIEW COMPARISON:  May 21, 2020 FINDINGS: The heart size and mediastinal contours are within  normal limits. Multiple displaced right-sided rib fractures including the right sixth, seventh, eighth and ninth ribs. Small small right hydropneumothorax with left basilar airspace opacities. No evidence of tension. Left lung is clear. Right chest wall subcutaneous emphysema. IMPRESSION: 1. Small right hydropneumothorax without evidence of tension. Right basilar airspace opacities. 2. Multiple displaced right-sided rib fractures with associated right basilar airspace opacities. Electronically Signed   By: Dahlia Bailiff MD   On: 05/11/2021 01:07   CT PERC PLEURAL DRAIN W/INDWELL CATH W/IMG GUIDE  Result Date: 06/01/2021 INDICATION: 80 year old female with persisting/recurrent right apical pneumothorax after prior chest tube EXAM: CT-GUIDED THORACOSTOMY TUBE MEDICATIONS: None ANESTHESIA/SEDATION: Fentanyl 25 mcg IV; Versed 0 mg IV Moderate Sedation Time:  0 The patient was continuously monitored during the procedure by the interventional radiology nurse under my direct supervision. COMPLICATIONS: None PROCEDURE: Informed written consent was obtained from the patient after a thorough discussion of the procedural risks, benefits and alternatives. All questions were addressed. Maximal Sterile Barrier Technique was utilized including caps, mask, sterile gowns, sterile gloves, sterile drape, hand hygiene and skin antiseptic. A timeout was performed prior to the initiation of the procedure. Patient was positioned supine position on CT gantry table. Scout CT was acquired for planning purposes. The patient was then prepped and draped in the usual sterile fashion. 1% lidocaine was used for local anesthesia. Small stab incision was made with 11 blade scalpel along the anterior upper right chest wall. Yueh needle was then used to enter the pleural space with aspiration of air. The plastic catheter was advanced and the needle was removed. A wire was placed into the pleural space. Modified Seldinger technique was then used to  place a 8.5 French pigtail drainage catheter. Catheter was attached to the evac chamber and we confirmed aspiration of air. Final CT was acquired. Catheter was sutured in position. Patient tolerated the procedure well and remained hemodynamically stable throughout. No complications were encountered and no significant blood loss. IMPRESSION: Status post CT-guided placement of right apical thoracostomy tube. Signed, Dulcy Fanny. Dellia Nims, RPVI [facility] Radiology Electronically Signed   By: Corrie Mckusick D.O.   On: 06/01/2021 13:16   CT [facility] PLEURAL DRAIN W/INDWELL CATH W/IMG GUIDE  Result Date: 05/26/2021 INDICATION: 80 year old female with posttraumatic right hydropneumothorax. She has multiple severely displaced rib fractures and is not a candidate for percutaneous thoracentesis. She presents for chest tube placement. EXAM: Placement of chest tube with CT guidance MEDICATIONS: None ANESTHESIA/SEDATION: Fentanyl 37.5 mcg IV; Versed 0.5 mg IV Moderate Sedation Time:  11 minutes The patient was continuously monitored during the procedure by the interventional radiology nurse under my direct supervision. COMPLICATIONS: None immediate. PROCEDURE: Informed written consent  was obtained from the patient after a thorough discussion of the procedural risks, benefits and alternatives. All questions were addressed. Maximal Sterile Barrier Technique was utilized including caps, mask, sterile gowns, sterile gloves, sterile drape, hand hygiene and skin antiseptic. A timeout was performed prior to the initiation of the procedure. An axial CT scan was performed. The large right-sided hydropneumothorax was successfully identified. A suitable entry site over a rib along the right lateral chest wall inferior to the breast was identified. The skin entry site was marked and then the skin was sterilely prepped and draped in the standard fashion using chlorhexidine skin prep. Local  anesthesia was attained by infiltration with 1% lidocaine. A small dermatotomy was made. Using trocar technique, a Cook 12 [place] all-purpose drainage catheter was advanced over the rib and positioned in the pleural fluid. The catheter was connected to low wall suction via a Pleur Evac. The catheter was secured to the skin with 0 Prolene suture. Sterile bandages were applied. Post placement CT scanning demonstrates a well-positioned chest tube in the dependent aspect of the pleural effusion. IMPRESSION: Successful placement of 12 French chest tube with CT guidance. Electronically Signed   By: Jacqulynn Cadet M.D.   On: 05/26/2021 11:11   VAS [place] LOWER EXTREMITY VENOUS (DVT) (ONLY [facility] & [facility] 7a-7p)  Result Date: 05/25/2021  Lower Venous DVT Study Patient Name:  KAILLY RICHOUX Wyman  Date of Exam:   05/25/2021 Medical Rec #: [id number]          Accession #:    [id number] Date of Birth: [birth year]           Patient Gender: F Patient Age:   79Y Exam Location:  [facility] Procedure:      VAS [place] LOWER EXTREMITY VENOUS (DVT) Referring Phys: [id number] [place] --------------------------------------------------------------------------------  Indications: Swelling.  Risk Factors: None identified. Comparison Study: No prior studies. Performing Technologist: Oliver Hum RVT  Examination Guidelines: A complete evaluation includes B-mode imaging, spectral Doppler, color Doppler, and power Doppler as needed of all accessible portions of each vessel. Bilateral testing is considered an integral part of a complete examination. Limited examinations for reoccurring indications may be performed as noted. The reflux portion of the exam is performed with the patient in reverse Trendelenburg.  +---------+---------------+---------+-----------+----------+--------------+ RIGHT    CompressibilityPhasicitySpontaneityPropertiesThrombus Aging +---------+---------------+---------+-----------+----------+--------------+  CFV      Full           Yes      Yes                                 +---------+---------------+---------+-----------+----------+--------------+ SFJ      Full                                                        +---------+---------------+---------+-----------+----------+--------------+ FV Prox  Full                                                        +---------+---------------+---------+-----------+----------+--------------+ FV Mid   Full                                                        +---------+---------------+---------+-----------+----------+--------------+  FV DistalFull                                                        +---------+---------------+---------+-----------+----------+--------------+ PFV      Full                                                        +---------+---------------+---------+-----------+----------+--------------+ POP      Full           Yes      Yes                                 +---------+---------------+---------+-----------+----------+--------------+ PTV      Full                                                        +---------+---------------+---------+-----------+----------+--------------+ PERO     Full                                                        +---------+---------------+---------+-----------+----------+--------------+ Gastroc  Partial                                      Acute          +---------+---------------+---------+-----------+----------+--------------+   +---------+---------------+---------+-----------+----------+--------------+ LEFT     CompressibilityPhasicitySpontaneityPropertiesThrombus Aging +---------+---------------+---------+-----------+----------+--------------+ CFV      Full           Yes      Yes                                 +---------+---------------+---------+-----------+----------+--------------+ SFJ      Full                                                         +---------+---------------+---------+-----------+----------+--------------+ FV Prox  Full                                                        +---------+---------------+---------+-----------+----------+--------------+ FV Mid   Full                                                        +---------+---------------+---------+-----------+----------+--------------+  FV DistalFull                                                        +---------+---------------+---------+-----------+----------+--------------+ PFV      Full                                                        +---------+---------------+---------+-----------+----------+--------------+ POP      Full           Yes      Yes                                 +---------+---------------+---------+-----------+----------+--------------+ PTV      Full                                                        +---------+---------------+---------+-----------+----------+--------------+ PERO     Full                                                        +---------+---------------+---------+-----------+----------+--------------+     Summary: RIGHT: - Findings consistent with acute deep vein thrombosis involving the right gastrocnemius veins. - No cystic structure found in the popliteal fossa.  LEFT: - There is no evidence of deep vein thrombosis in the lower extremity.  - No cystic structure found in the popliteal fossa.  *See table(s) above for measurements and observations. Electronically signed by Deitra Mayo MD on 05/25/2021 at 6:43:30 PM.    Final    ECHOCARDIOGRAM LIMITED  Result Date: 05/18/2021    ECHOCARDIOGRAM LIMITED REPORT   Patient Name:   NDIDI NESBY Reading Date of Exam: 05/18/2021 Medical Rec #:  [id number]         Height:       68.0 in Accession #:    [id number]        Weight:       135.0 lb Date of Birth:  1941-11-23          BSA:          1.729 m Patient Age:    85 years           BP:           105/47 mmHg Patient Gender: F                 HR:           83 bpm. Exam Location:  Inpatient Procedure: Limited Echo Indications:     Atrial Fibrillation I48.91  History:         Patient has no prior history of Echocardiogram examinations.                  Risk Factors:Hypertension.  Sonographer:     Darlina Sicilian RDCS  Referring Phys:  [place] Diagnosing Phys: Adrian Prows MD  Sonographer Comments: Exam ended per patients request, the exam was causing too much pain. IMPRESSIONS  1. Grossly preserved LVEF by image 16. . Left ventricular endocardial border not optimally defined to evaluate regional wall motion. Left ventricular diastolic function could not be evaluated.  2. Right ventricular systolic function was not well visualized.  3. Grossly normal sized.  4. Grossly no pericardial efffusion.  5. The mitral valve was not well visualized. No evidence of mitral valve regurgitation.  6. The aortic valve was not well visualized. Conclusion(s)/Recommendation(s): Poor echo window, patient with flail chest and hydropneumothorax involving the right and difficulty image. Grossly left ventricular systolic function appears preserved and the left atrium appears grossly normal sized. FINDINGS  Left Ventricle: Grossly preserved LVEF by image 16. Left ventricular endocardial border not optimally defined to evaluate regional wall motion. Left ventricular diastolic function could not be evaluated. Right Ventricle: Right ventricular systolic function was not well visualized. Left Atrium: Grossly normal sized. Left atrial size was not well visualized. Right Atrium: Right atrial size was not well visualized. Pericardium: Grossly no pericardial efffusion. The pericardium was not well visualized. Mitral Valve: The mitral valve was not well visualized. Tricuspid Valve: The tricuspid valve is not well visualized. Aortic Valve: The aortic valve was not well visualized. Pulmonic Valve: The pulmonic valve was  not assessed. Aorta: The aortic root was not well visualized. Venous: The inferior vena cava was not well visualized. LEFT VENTRICLE PLAX 2D LVOT diam:     1.90 cm LVOT Area:     2.84 cm   AORTA Ao Root diam: 3.40 cm  SHUNTS Systemic Diam: 1.90 cm Adrian Prows MD Electronically signed by Adrian Prows MD Signature Date/Time: 05/18/2021/9:20:31 PM    Final    IR THORACENTESIS ASP PLEURAL SPACE W/IMG GUIDE  Result Date: 05/17/2021 INDICATION: Patient with right pleural effusion status post fall. Request is made for right therapeutic thoracentesis. EXAM: ULTRASOUND GUIDED THERAPEUTIC RIGHT THORACENTESIS MEDICATIONS: 10 ML 1% LIDOCAINE COMPLICATIONS: None immediate. PROCEDURE: An ultrasound guided thoracentesis was thoroughly discussed with the patient and questions answered. The benefits, risks, alternatives and complications were also discussed. The patient understands and wishes to proceed with the procedure. Written consent was obtained. Ultrasound was performed to localize and mark an adequate pocket of fluid in the right chest. The area was then prepped and draped in the normal sterile fashion. 1% Lidocaine was used for local anesthesia. Under ultrasound guidance a 6 Fr Safe-T-Centesis catheter was introduced. Thoracentesis was performed. The catheter was removed and a dressing applied. FINDINGS: A total of approximately 900 mL of bloody fluid was removed. IMPRESSION: Successful ultrasound guided therapeutic right thoracentesis yielding 900 mL of pleural fluid. Read by: Brynda Greathouse PA-C Electronically Signed   By: Ruthann Cancer MD   On: 05/17/2021 15:48     Subjective: Patient seen and examined at bedside, sleeping but arousable.  Very fatigued.  Continues with very poor oral intake.  States pain is controlled.  No family present at bedside this morning.  Will be discharging to [facility] for further care today.  No acute concerns overnight per nursing staff.  Discharge Exam: Vitals:    06/07/21 1207 06/08/21 0829  BP: (!) 167/80 (!) 186/83  Pulse: 70   Resp:    Temp:    SpO2:     Vitals:   06/06/21 0853 06/06/21 0856 06/07/21 1207 06/08/21 0829  BP:  135/65 (!) 167/80 Marland Kitchen)  186/83  Pulse: (!) 115  70   Resp:  18    Temp:      TempSrc:      SpO2:      Weight:      Height:        General: Pt is alert, awake, not in acute distress thin/cachectic/chronically ill in appearance Cardiovascular: Irregularly irregular rhythm, normal rate, S1/S2 +, no rubs, no gallops Respiratory: CTA bilaterally, no wheezing, no rhonchi on 3 L nasal cannula Abdominal: Soft, NT, ND, bowel sounds + Extremities: no edema, no cyanosis    The results of significant diagnostics from this hospitalization (including imaging, microbiology, ancillary and laboratory) are listed below for reference.     Microbiology: No results found for this or any previous visit (from the past 240 hour(s)).   Labs: BNP (last 3 results) Recent Labs    05/25/21 1358  BNP 163.8*   Basic Metabolic Panel: Recent Labs  Lab 06/02/21 0314 06/03/21 0349  NA 144 146*  K 4.4 4.3  CL 95* 95*  CO2 44* 47*  GLUCOSE 142* 123*  BUN 32* 30*  CREATININE 0.57 0.65  CALCIUM 10.0 9.8  MG 2.1  --    Liver Function Tests: No results for input(s): AST, ALT, ALKPHOS, BILITOT, PROT, ALBUMIN in the last 168 hours. No results for input(s): LIPASE, AMYLASE in the last 168 hours. No results for input(s): AMMONIA in the last 168 hours. CBC: Recent Labs  Lab 06/02/21 0314 06/03/21 0349 06/05/21 1001  WBC 7.9 6.6 5.5  HGB 10.4* 10.1* 10.1*  HCT 36.0 34.2* 34.8*  MCV 102.6* 102.1* 101.8*  PLT 256 210 195   Cardiac Enzymes: No results for input(s): CKTOTAL, CKMB, CKMBINDEX, TROPONINI in the last 168 hours. BNP: Invalid input(s): POCBNP CBG: No results for input(s): GLUCAP in the last 168 hours. D-Dimer No results for input(s): DDIMER in the last 72 hours. Hgb A1c No results for input(s): HGBA1C in the  last 72 hours. Lipid Profile No results for input(s): CHOL, HDL, LDLCALC, TRIG, CHOLHDL, LDLDIRECT in the last 72 hours. Thyroid function studies No results for input(s): TSH, T4TOTAL, T3FREE, THYROIDAB in the last 72 hours.  Invalid input(s): FREET3 Anemia work up No results for input(s): VITAMINB12, FOLATE, FERRITIN, TIBC, IRON, RETICCTPCT in the last 72 hours. Urinalysis    Component Value Date/Time   COLORURINE AMBER (A) 06/06/2021 0831   APPEARANCEUR CLOUDY (A) 06/06/2021 0831   LABSPEC 1.016 06/06/2021 0831   LABSPEC 1.005 06/01/2009 1353   PHURINE 9.0 (H) 06/06/2021 0831   GLUCOSEU NEGATIVE 06/06/2021 0831   GLUCOSEU NEGATIVE 01/16/2018 1127   HGBUR NEGATIVE 06/06/2021 0831   BILIRUBINUR NEGATIVE 06/06/2021 0831   BILIRUBINUR Negative 06/01/2009 1353   KETONESUR NEGATIVE 06/06/2021 0831   PROTEINUR 100 (A) 06/06/2021 0831   UROBILINOGEN 0.2 01/16/2018 1127   NITRITE NEGATIVE 06/06/2021 0831   LEUKOCYTESUR LARGE (A) 06/06/2021 0831   LEUKOCYTESUR Small 06/01/2009 1353   Sepsis Labs Invalid input(s): PROCALCITONIN,  WBC,  LACTICIDVEN Microbiology No results found for this or any previous visit (from the past 240 hour(s)).   Time coordinating discharge: Over 30 minutes  SIGNED:   Donnamarie Poag [place] DO  [facility] 06/08/2021, 11:58 AM

## 2021-06-08 NOTE — Care Management Important Message (Signed)
Important Message  Patient Details  Name: Melanie [name] MRN: [id number] Date of Birth: 1941/02/21   Medicare Important Message Given:  Yes     [name] 06/08/2021, 1:19 PM

## 2021-06-23 ENCOUNTER — Telehealth: Payer: Self-pay | Admitting: Pharmacist

## 2021-06-23 NOTE — Chronic Care Management (AMB) (Signed)
Chronic Care Management Pharmacy Assistant   Name: Melanie [name]  MRN: [id number] DOB: Dec 04, 1941  Reason for Encounter: Disease State/ General Assessment Call.    Conditions to be addressed/monitored: HTN and HLD  Recent office visits:  None.   Recent consult visits:  None.   Hospital visits:  Medication Reconciliation was completed by comparing discharge summary, patient's EMR and Pharmacy list, and upon discussion with patient.  Admitted to the hospital on 05/25/21 due to Atrial Fibrillation with RVR. Discharge date was 06/09/21. Discharged from [place]?Medications Started at [facility] Discharge:?? -started glycopyrrolate and lidoderm.  Medication Changes at Hospital Discharge: -Changed acetaminophen, diltiazem and lorazepam.   Medications Discontinued at Hospital Discharge: -Stopped Calcium- Vitamin D, cetirizine 10mg , docusate sodium 100mg , fluticasone 58mcg, hydrocortisone cream, losartan, oxycodone 5mg , pantoprazole 20mg , polyethylene glycol and Vitamin D.   Medications that remain the same after Hospital Discharge:??  -All other medications will remain the same.     Medication Reconciliation was completed by comparing discharge summary, patient's EMR and Pharmacy list, and upon discussion with patient.  Patient visited [facility] on 05/25/21 due Hydropneumothorax, foot swelling and other issues.   New?Medications Started at [facility] Discharge:?? -started None.   Medication Changes at Hospital Discharge: -Changed None.   Medications Discontinued at Hospital Discharge: -Stopped None.   Medications that remain the same after Hospital Discharge:??  -All other medications will remain the same.    Medications: Outpatient Encounter Medications as of 06/23/2021  Medication Sig   acetaminophen (TYLENOL) 500 MG tablet Take 2 tablets (1,000 mg total) by mouth every 8 (eight) hours.   diltiazem (CARDIZEM) 60  MG tablet Take 1 tablet (60 mg total) by mouth every 6 (six) hours.   glycopyrrolate (ROBINUL) 1 MG tablet Take 1 tablet (1 mg total) by mouth every 4 (four) hours as needed (excessive secretions).   lidocaine (LIDODERM) 5 % Place 1 patch onto the skin daily. Remove & Discard patch within 12 hours or as directed by MD   LORazepam (ATIVAN) 0.5 MG tablet Take 1 tablet (0.5 mg total) by mouth every 6 (six) hours as needed for anxiety.   metoprolol tartrate (LOPRESSOR) 50 MG tablet Take 1 tablet (50 mg total) by mouth 2 (two) times daily.   polyvinyl alcohol (LIQUIFILM TEARS) 1.4 % ophthalmic solution Place 1 drop into both eyes as needed for dry eyes.   No facility-administered encounter medications on file as of 06/23/2021.    Reviewed chart prior to disease state call. Spoke with patient regarding BP  Recent Office Vitals: BP Readings from Last 3 Encounters:  06/08/21 114/64  05/25/21 (!) 125/44  05/20/21 134/68   Pulse Readings from Last 3 Encounters:  06/08/21 80  05/25/21 (!) 101  05/20/21 82    Wt Readings from Last 3 Encounters:  05/26/21 132 lb 0.9 oz (59.9 kg)  05/11/21 135 lb (61.2 kg)  03/30/21 134 lb 2 oz (60.8 kg)     Kidney Function Lab Results  Component Value Date/Time   CREATININE 0.65 06/03/2021 03:49 AM   CREATININE 0.57 06/02/2021 03:14 AM   CREATININE 0.87 09/09/2020 12:22 PM   CREATININE 0.83 04/14/2016 10:47 AM   GFR 63.25 03/30/2021 01:01 PM   GFRNONAA >60 06/03/2021 03:49 AM   GFRNONAA 62 09/09/2020 12:22 PM   GFRAA 72 09/09/2020 12:22 PM    BMP Latest Ref Rng & Units 06/03/2021 06/02/2021 05/30/2021  Glucose 70 - 99 mg/dL 123(H) 142(H) 113(H)  BUN 8 - 23 mg/dL 30(H) 32(H) 24(H)  Creatinine 0.44 - 1.00 mg/dL 0.65 0.57 0.60  BUN/Creat Ratio 6 - 22 (calc) - - -  Sodium 135 - 145 mmol/L 146(H) 144 143  Potassium 3.5 - 5.1 mmol/L 4.3 4.4 4.2  Chloride 98 - 111 mmol/L 95(L) 95(L) 99  CO2 22 - 32 mmol/L 47(H) 44(H) 36(H)  Calcium 8.9 - 10.3 mg/dL 9.8 10.0  9.6    Current antihypertensive regimen:  Losartan 100 mg 1 tablet daily How often are you checking your Blood Pressure?  Current home BP readings:  What recent interventions/DTPs have been made by any provider to improve Blood Pressure control since last CPP Visit: losartan was stopped in hospital recently.  Any recent hospitalizations or ED visits since last visit with CPP? Yes What diet changes have been made to improve Blood Pressure Control?   What exercise is being done to improve your Blood Pressure Control?    Adherence Review: Is the patient currently on ACE/ARB medication? Yes Does the patient have >5 day gap between last estimated fill dates? No  Comprehensive medication review performed; Spoke to patient regarding cholesterol  Lipid Panel No results found for: CHOL, TRIG, HDL, LDLCALC, LDLDIRECT  10-year ASCVD risk score: The ASCVD Risk score Mikey Bussing DC Jr., et al., 2013) failed to calculate for the following reasons:   The 2013 ASCVD risk score is only valid for ages 83 to 77  Current antihyperlipidemic regimen:  Red yeast rice 600 mg 1 capsule twice daily Previous antihyperlipidemic medications tried: unknown ASCVD risk enhancing conditions: age >43 and HTN What recent interventions/DTPs have been made by any provider to improve Cholesterol control since last CPP Visit: None.  Any recent hospitalizations or ED visits since last visit with CPP? Yes What diet changes have been made to improve Cholesterol?   What exercise is being done to improve Cholesterol?    Adherence Review: Does the patient have >5 day gap between last estimated fill dates? No  Multiple unsuccessful attempts to reach patient by phone.  Star Rating Drugs:  Losartan 100mg  - last filled on 03/22/21 90DS at [facility].   [place]  Clinical Pharmacist Assistant [phone]

## 2021-06-23 NOTE — Progress Notes (Deleted)
Primary Physician/Referring:  Martinique, Betty G, MD  Patient ID: Melanie [name], female    DOB: 1941/08/25, 80 y.o.   MRN: [id number]  No chief complaint on file.  HPI:    Melanie [name]  is a 80 y.o.  caucasian female with history of hypertension, COPD, multinodular goiter. Recently admitted 5/23 with traumatic hemopneumothorax and new onset atrial fibrillation with RVR. Patient was previously discharged on metoprolol and diltiazem, without anticoagulation given hemothorax. Patient was discharged and then presented back to emergency department 05/25/2021 with recurrent traumatic hydropneumothorax, new acute DVT and in atrial fibrillation with RVR.  At that time felt atrial fibrillation was likely driven by recurrent hydropneumothorax.  During hospitalization recommended rate control strategy.  Given high cardioembolic risk recommended anticoagulation both her atrial fibrillation and right leg DVT, shared decision with patient, primary team, and IR team was that bleeding risk given recent traumatic injuries was not prohibitive of initiating anticoagulation.  Therefore patient was discharged on Eliquis as well as Cardizem and metoprolol.  Patient now presents for follow up of atrial fibrillation. ***  ***  Past Medical History:  Diagnosis Date   Anxiety    Cancer ([place])    rectal ca   COPD (chronic obstructive pulmonary disease) (HCC)    Thyroid disease    Past Surgical History:  Procedure Laterality Date   ABDOMINAL HYSTERECTOMY     COLON SURGERY     IR THORACENTESIS ASP PLEURAL SPACE W/IMG GUIDE  05/17/2021   Family History  Problem Relation Age of Onset   Cancer Sister        Lung cancer    Social History   Tobacco Use   Smoking status: Former    Pack years: 0.00    Types: Cigarettes   Smokeless tobacco: Never  Substance Use Topics   Alcohol use: No   Marital Status: Married   ROS  ***ROS  Objective  There were no vitals taken for this visit.  Vitals with BMI  06/08/2021 06/08/2021 06/08/2021  Height - - -  Weight - - -  BMI - - -  Systolic [phone]  Diastolic 64 74 83  Pulse 80 - 70      ***Physical Exam  Laboratory examination:   Recent Labs    09/09/20 1222 03/30/21 1301 05/30/21 0305 06/02/21 0314 06/03/21 0349  NA 136   < > 143 144 146*  K 4.0   < > 4.2 4.4 4.3  CL 99   < > 99 95* 95*  CO2 32   < > 36* 44* 47*  GLUCOSE 88   < > 113* 142* 123*  BUN 18   < > 24* 32* 30*  CREATININE 0.87   < > 0.60 0.57 0.65  CALCIUM 9.9   < > 9.6 10.0 9.8  GFRNONAA 62   < > >60 >60 >60  GFRAA 72  --   --   --   --    < > = values in this interval not displayed.   CrCl cannot be calculated (Unknown ideal weight.).  CMP Latest Ref Rng & Units 06/03/2021 06/02/2021 05/30/2021  Glucose 70 - 99 mg/dL 123(H) 142(H) 113(H)  BUN 8 - 23 mg/dL 30(H) 32(H) 24(H)  Creatinine 0.44 - 1.00 mg/dL 0.65 0.57 0.60  Sodium 135 - 145 mmol/L 146(H) 144 143  Potassium 3.5 - 5.1 mmol/L 4.3 4.4 4.2  Chloride 98 - 111 mmol/L 95(L) 95(L) 99  CO2 22 - 32 mmol/L 47(H) 44(H) 36(H)  Calcium 8.9 - 10.3 mg/dL 9.8 10.0 9.6  Total Protein 6.5 - 8.1 g/dL - - -  Total Bilirubin 0.3 - 1.2 mg/dL - - -  Alkaline Phos 38 - 126 U/L - - -  AST 15 - 41 U/L - - -  ALT 0 - 44 U/L - - -   CBC Latest Ref Rng & Units 06/05/2021 06/03/2021 06/02/2021  WBC 4.0 - 10.5 K/uL 5.5 6.6 7.9  Hemoglobin 12.0 - 15.0 g/dL 10.1(L) 10.1(L) 10.4(L)  Hematocrit 36.0 - 46.0 % 34.8(L) 34.2(L) 36.0  Platelets 150 - 400 K/uL 195 210 256    Lipid Panel No results for input(s): CHOL, TRIG, LDLCALC, VLDL, HDL, CHOLHDL, LDLDIRECT in the last 8760 hours.  HEMOGLOBIN A1C No results found for: HGBA1C, MPG TSH Recent Labs    03/30/21 1301 05/18/21 1322  TSH 0.57 0.726    External labs:   None   Allergies   Allergies  Allergen Reactions   Latex Rash   Macrodantin Shortness Of Breath   Guaifenesin Er Itching   Morphine And Related Nausea Only   Ditropan [Oxybutynin]     Causes diarrhea -  contains Magnesium   Lipitor [Atorvastatin]     rash   Lisinopril     Diarrhea/cough   Magnesium-Containing Compounds     Causes diarrhea   Pravastatin     Leg cramps   Zoloft [Sertraline Hcl]     Diarrhea      Medications Prior to Visit:   Outpatient Medications Prior to Visit  Medication Sig Dispense Refill   acetaminophen (TYLENOL) 500 MG tablet Take 2 tablets (1,000 mg total) by mouth every 8 (eight) hours. 30 tablet 0   diltiazem (CARDIZEM) 60 MG tablet Take 1 tablet (60 mg total) by mouth every 6 (six) hours.     glycopyrrolate (ROBINUL) 1 MG tablet Take 1 tablet (1 mg total) by mouth every 4 (four) hours as needed (excessive secretions).     lidocaine (LIDODERM) 5 % Place 1 patch onto the skin daily. Remove & Discard patch within 12 hours or as directed by MD 30 patch 0   LORazepam (ATIVAN) 0.5 MG tablet Take 1 tablet (0.5 mg total) by mouth every 6 (six) hours as needed for anxiety. 30 tablet 0   metoprolol tartrate (LOPRESSOR) 50 MG tablet Take 1 tablet (50 mg total) by mouth 2 (two) times daily. 60 tablet 1   polyvinyl alcohol (LIQUIFILM TEARS) 1.4 % ophthalmic solution Place 1 drop into both eyes as needed for dry eyes.     No facility-administered medications prior to visit.     Final Medications at End of Visit    No outpatient medications have been marked as taking for the 06/24/21 encounter (Appointment) with Rayetta Pigg, Korrine Sicard C, PA-C.     Radiology:   No results found.  Cardiac Studies:   Echocardiogram 05/18/2021:  1. Grossly preserved LVEF by image 16. . Left ventricular endocardial  border not optimally defined to evaluate regional wall motion. Left  ventricular diastolic function could not be evaluated.   2. Right ventricular systolic function was not well visualized.   3. Grossly normal sized.   4. Grossly no pericardial efffusion.   5. The mitral valve was not well visualized. No evidence of mitral valve  regurgitation.   6. The aortic valve was  not well visualized.   DVT lower extremity 05/25/2021:  RIGHT:  - Findings consistent with acute deep vein thrombosis involving the right gastrocnemius veins.  - No cystic  structure found in the popliteal fossa.  LEFT:  - There is no evidence of deep vein thrombosis in the lower extremity.  - No cystic structure found in the popliteal fossa.   EKG:   ***    05/25/2021: Atrial fibrillation with rapid ventricular response at a rate of 110 bpm.  No evidence of ischemia or underlying injury pattern.  Assessment  No diagnosis found.   There are no discontinued medications.  No orders of the defined types were placed in this encounter.  This patients CHA2DS2-VASc Score 3 (age, HTN) and yearly risk of stroke 3.2%.  Recommendations:   Melanie [name] is a 80 y.o. ***  ***  Patient was seen in collaboration with Dr. Marland Kitchen He also reviewed patient's chart and examined the patient. Dr. Marland Kitchen is in agreement of the plan.   During this visit I reviewed and updated: Tobacco history  allergies medication reconciliation  medical history  surgical history  family history  social history.  This note was created using a voice recognition software as a result there may be grammatical errors inadvertently enclosed that do not reflect the nature of this encounter. Every attempt is made to correct such errors.   Alethia Berthold, PA-C 06/23/2021, 2:11 PM Office: [phone]

## 2021-06-24 ENCOUNTER — Ambulatory Visit: Payer: Medicare Other | Admitting: Student

## 2021-06-24 NOTE — Telephone Encounter (Cosign Needed)
2nd attempt

## 2021-06-25 DEATH — deceased

## 2021-06-29 ENCOUNTER — Telehealth: Payer: Medicare Other

## 2021-06-29 NOTE — Telephone Encounter (Cosign Needed)
3rd attempt

## 2021-07-21 ENCOUNTER — Telehealth: Payer: Self-pay | Admitting: Pharmacist

## 2021-07-21 NOTE — Chronic Care Management (AMB) (Signed)
Chronic Care Management Pharmacy Assistant   Name: Melanie [name]  MRN: [id number] DOB: [birth year]  Reason for Encounter: Disease State/ General Assessment Call.    Conditions to be addressed/monitored: HTN and HLD  Recent office visits:  None.   Recent consult visits:  None.   Hospital visits:  Medication Reconciliation was completed by comparing discharge summary, patient's EMR and Pharmacy list, and upon discussion with patient.   Admitted to the hospital on 05/25/21 due to Atrial Fibrillation with RVR. Discharge date was 06/09/21. Discharged from [facility]?Medications Started at [facility] Discharge:?? -started glycopyrrolate and lidoderm.   Medication Changes at Hospital Discharge: -Changed acetaminophen, diltiazem and lorazepam.    Medications Discontinued at Hospital Discharge: -Stopped Calcium- Vitamin D, cetirizine '10mg'$ , docusate sodium '100mg'$ , fluticasone 53mg, hydrocortisone cream, losartan, oxycodone '5mg'$ , pantoprazole '20mg'$ , polyethylene glycol and Vitamin D.    Medications that remain the same after Hospital Discharge:?? -All other medications will remain the same.       Medication Reconciliation was completed by comparing discharge summary, patient's EMR and Pharmacy list, and upon discussion with patient.   [facility] Hydropneum[facility], foot swelling and other issues.    [facility]    Medication Changes at Hospital Discharge: -Changed None.    Medications Discontinued at Hospital Discharge: -Stopped None.    Medications that remain the same after Hospital Discharge:?? -All other medications will remain the same.      Medications: Outpatient Encounter Medications as of 07/21/2021  Medication Sig   acetaminophen (TYLENOL) 500 MG tablet Take 2 tablets (1,000 mg total) by mouth every 8 (eight) hours.   diltiazem  (CARDIZEM) 60 MG tablet Take 1 tablet (60 mg total) by mouth every 6 (six) hours.   glycopyrrolate (ROBINUL) 1 MG tablet Take 1 tablet (1 mg total) by mouth every 4 (four) hours as needed (excessive secretions).   lidocaine (LIDODERM) 5 % Place 1 patch onto the skin daily. Remove & Discard patch within 12 hours or as directed by MD   LORazepam (ATIVAN) 0.5 MG tablet Take 1 tablet (0.5 mg total) by mouth every 6 (six) hours as needed for anxiety.   metoprolol tartrate (LOPRESSOR) 50 MG tablet Take 1 tablet (50 mg total) by mouth 2 (two) times daily.   polyvinyl alcohol (LIQUIFILM TEARS) 1.4 % ophthalmic solution Place 1 drop into both eyes as needed for dry eyes.   No facility-administered encounter medications on file as of 07/21/2021.   Reviewed chart prior to disease state call. Spoke with patient regarding BP  Recent Office Vitals: BP Readings from Last 3 Encounters:  06/08/21 114/64  05/25/21 (!) 125/44  05/20/21 134/68   Pulse Readings from Last 3 Encounters:  06/08/21 80  05/25/21 (!) 101  05/20/21 82    Wt Readings from Last 3 Encounters:  05/26/21 132 lb 0.9 oz (59.9 kg)  05/11/21 135 lb (61.2 kg)  03/30/21 134 lb 2 oz (60.8 kg)     Kidney Function Lab Results  Component Value Date/Time   CREATININE 0.65 06/03/2021 03:49 AM   CREATININE 0.57 06/02/2021 03:14 AM   CREATININE 0.87 09/09/2020 12:22 PM   CREATININE 0.83 04/14/2016 10:47 AM   GFR 63.25 03/30/2021 01:01 PM   GFRNONAA >60 06/03/2021 03:49 AM   GFRNONAA 62 09/09/2020 12:22 PM   GFRAA 72 09/09/2020 12:22 PM    BMP Latest Ref Rng & Units 06/03/2021  06/02/2021 05/30/2021  Glucose 70 - 99 mg/dL 123(H) 142(H) 113(H)  BUN 8 - 23 mg/dL 30(H) 32(H) 24(H)  Creatinine 0.44 - 1.00 mg/dL 0.65 0.57 0.60  BUN/Creat Ratio 6 - 22 (calc) - - -  Sodium 135 - 145 mmol/L 146(H) 144 143  Potassium 3.5 - 5.1 mmol/L 4.3 4.4 4.2  Chloride 98 - 111 mmol/L 95(L) 95(L) 99  CO2 22 - 32 mmol/L 47(H) 44(H) 36(H)  Calcium 8.9 - 10.3  mg/dL 9.8 10.0 9.6    Current antihypertensive regimen:  Metoprolol '50mg'$  - take 1 tablet by mouth two times daily. Diltiazem '60mg'$  - take 1 tablet by mouth every 6 hours. How often are you checking your Blood Pressure?  Current home BP readings:  What recent interventions/DTPs have been made by any provider to improve Blood Pressure control since last CPP Visit: losartan stopped at ED. Any recent hospitalizations or ED visits since last visit with CPP? Yes What diet changes have been made to improve Blood Pressure Control?   What exercise is being done to improve your Blood Pressure Control?    Adherence Review: Is the patient currently on ACE/ARB medication? No Does the patient have >5 day gap between last estimated fill dates? No   Comprehensive medication review performed; Spoke to patient regarding cholesterol  Lipid Panel No results found for: CHOL, TRIG, HDL, LDLCALC, LDLDIRECT  10-year ASCVD risk score: The ASCVD Risk score Mikey Bussing DC Jr., et al., 2013) failed to calculate for the following reasons:   The 2013 ASCVD risk score is only valid for ages 52 to 81  Current antihyperlipidemic regimen:  None Previous antihyperlipidemic medications tried: Red yeast rice. ASCVD risk enhancing conditions: age >65 and HTN What recent interventions/DTPs have been made by any provider to improve Cholesterol control since last CPP Visit: red yeast rice stopped in ED. Any recent hospitalizations or ED visits since last visit with CPP? Yes What diet changes have been made to improve Cholesterol?   What exercise is being done to improve Cholesterol?    Adherence Review: Does the patient have >5 day gap between last estimated fill dates? No  Multiple attempts to reach patient by phone.   Care Gaps:  AWV - message sent to Ramond Craver CMA to schedule.  Zoster vaccines [facility]) - never done DEXA SCAN - never done COVID-19 vaccine - booster 4 overdue since 03/05/21  Star Rating  Drugs:  None.   [place]  Clinical Pharmacist Assistant [phone]

## 2021-07-22 NOTE — Telephone Encounter (Cosign Needed)
2nd attempt

## 2021-07-28 NOTE — Telephone Encounter (Cosign Needed)
3rd attempt

## 2021-08-09 ENCOUNTER — Telehealth: Payer: Self-pay | Admitting: Pharmacist

## 2021-08-09 NOTE — Chronic Care Management (AMB) (Signed)
Chronic Care Management Pharmacy Assistant   Name: Melanie [name]  MRN: [id number] DOB: November 15, 1941  Reason for Encounter: Disease State/ General Assessment Call.    Conditions to be addressed/monitored: HTN and HLD   Recent office visits:  None.  Recent consult visits:  None.  Hospital visits:  None in previous 6 months  Medications: Outpatient Encounter Medications as of 08/09/2021  Medication Sig   acetaminophen (TYLENOL) 500 MG tablet Take 2 tablets (1,000 mg total) by mouth every 8 (eight) hours.   diltiazem (CARDIZEM) 60 MG tablet Take 1 tablet (60 mg total) by mouth every 6 (six) hours.   glycopyrrolate (ROBINUL) 1 MG tablet Take 1 tablet (1 mg total) by mouth every 4 (four) hours as needed (excessive secretions).   lidocaine (LIDODERM) 5 % Place 1 patch onto the skin daily. Remove & Discard patch within 12 hours or as directed by MD   LORazepam (ATIVAN) 0.5 MG tablet Take 1 tablet (0.5 mg total) by mouth every 6 (six) hours as needed for anxiety.   metoprolol tartrate (LOPRESSOR) 50 MG tablet Take 1 tablet (50 mg total) by mouth 2 (two) times daily.   polyvinyl alcohol (LIQUIFILM TEARS) 1.4 % ophthalmic solution Place 1 drop into both eyes as needed for dry eyes.   No facility-administered encounter medications on file as of 08/09/2021.   Fill History: LORAZEPAM 0.5 MG TABS 04/30/2021 25   LOSARTAN POTASSIUM 100 MG TABS 03/22/2021 90   METOPROLOL TARTRATE 50 MG TABS 05/20/2021 30   PANTOPRAZOLE SODIUM 20 MG TBEC 05/10/2021 90   DILTIAZEM HCL 30 MG TABS 05/20/2021 30   OXYCODONE HCL 5 MG TABS 05/21/2021 3   Reviewed chart prior to disease state call. Spoke with patient regarding BP  Recent Office Vitals: BP Readings from Last 3 Encounters:  06/08/21 114/64  05/25/21 (!) 125/44  05/20/21 134/68   Pulse Readings from Last 3 Encounters:  06/08/21 80  05/25/21 (!) 101  05/20/21 82    Wt Readings from Last 3 Encounters:  05/26/21 132 lb 0.9 oz (59.9  kg)  05/11/21 135 lb (61.2 kg)  03/30/21 134 lb 2 oz (60.8 kg)     Kidney Function Lab Results  Component Value Date/Time   CREATININE 0.65 06/03/2021 03:49 AM   CREATININE 0.57 06/02/2021 03:14 AM   CREATININE 0.87 09/09/2020 12:22 PM   CREATININE 0.83 04/14/2016 10:47 AM   GFR 63.25 03/30/2021 01:01 PM   GFRNONAA >60 06/03/2021 03:49 AM   GFRNONAA 62 09/09/2020 12:22 PM   GFRAA 72 09/09/2020 12:22 PM    BMP Latest Ref Rng & Units 06/03/2021 06/02/2021 05/30/2021  Glucose 70 - 99 mg/dL 123(H) 142(H) 113(H)  BUN 8 - 23 mg/dL 30(H) 32(H) 24(H)  Creatinine 0.44 - 1.00 mg/dL 0.65 0.57 0.60  BUN/Creat Ratio 6 - 22 (calc) - - -  Sodium 135 - 145 mmol/L 146(H) 144 143  Potassium 3.5 - 5.1 mmol/L 4.3 4.4 4.2  Chloride 98 - 111 mmol/L 95(L) 95(L) 99  CO2 22 - 32 mmol/L 47(H) 44(H) 36(H)  Calcium 8.9 - 10.3 mg/dL 9.8 10.0 9.6    Current antihypertensive regimen:  Metoprolol '50mg'$  - take 1 tablet by mouth two times daily. Diltiazem '60mg'$  - take 1 tablet by mouth every 6 hours. How often are you checking your Blood Pressure? Current home BP readings:  What recent interventions/DTPs have been made by any provider to improve Blood Pressure control since last CPP Visit: None. Any recent hospitalizations or ED visits since  last visit with CPP? Yes What diet changes have been made to improve Blood Pressure Control?   What exercise is being done to improve your Blood Pressure Control?    Adherence Review: Is the patient currently on ACE/ARB medication? No Does the patient have >5 day gap between last estimated fill dates? No  Comprehensive medication review performed; Spoke to patient regarding cholesterol  Lipid Panel No results found for: CHOL, TRIG, HDL, LDLCALC, LDLDIRECT  10-year ASCVD risk score: The ASCVD Risk score Mikey Bussing DC Jr., et al., 2013) failed to calculate for the following reasons:   The 2013 ASCVD risk score is only valid for ages 3 to 44  Current antihyperlipidemic  regimen:  None. Previous antihyperlipidemic medications tried: RED YEAST RICE. ASCVD risk enhancing conditions: age >29 and HTN What recent interventions/DTPs have been made by any provider to improve Cholesterol control since last CPP Visit: None. Any recent hospitalizations or ED visits since last visit with CPP? Yes What diet changes have been made to improve Cholesterol?   What exercise is being done to improve Cholesterol?    Adherence Review: Does the patient have >5 day gap between last estimated fill dates? No  Notes: Patient is DECEASED per Grandson.   Care Gaps:  AWV - message sent to Ramond Craver CMA to schedule.  Zoster vaccines [facility]) - never done DEXA SCAN - never done COVID-19 vaccine - booster 4 overdue since 03/05/21  Star Rating Drugs:  None.  [place]  Clinical Pharmacist Assistant [phone]

## 2021-12-06 IMAGING — DX DG CHEST 1V PORT
1 series · 1 of 1 positions shown · non-contrast
Comparison: CT 05/25/2021, chest radiograph 63 22

CLINICAL DATA: Short of breath. RIGHT chest tube placement.
Multiple RIGHT rib fractures.

EXAM:
PORTABLE CHEST 1 VIEW

[chest ap]
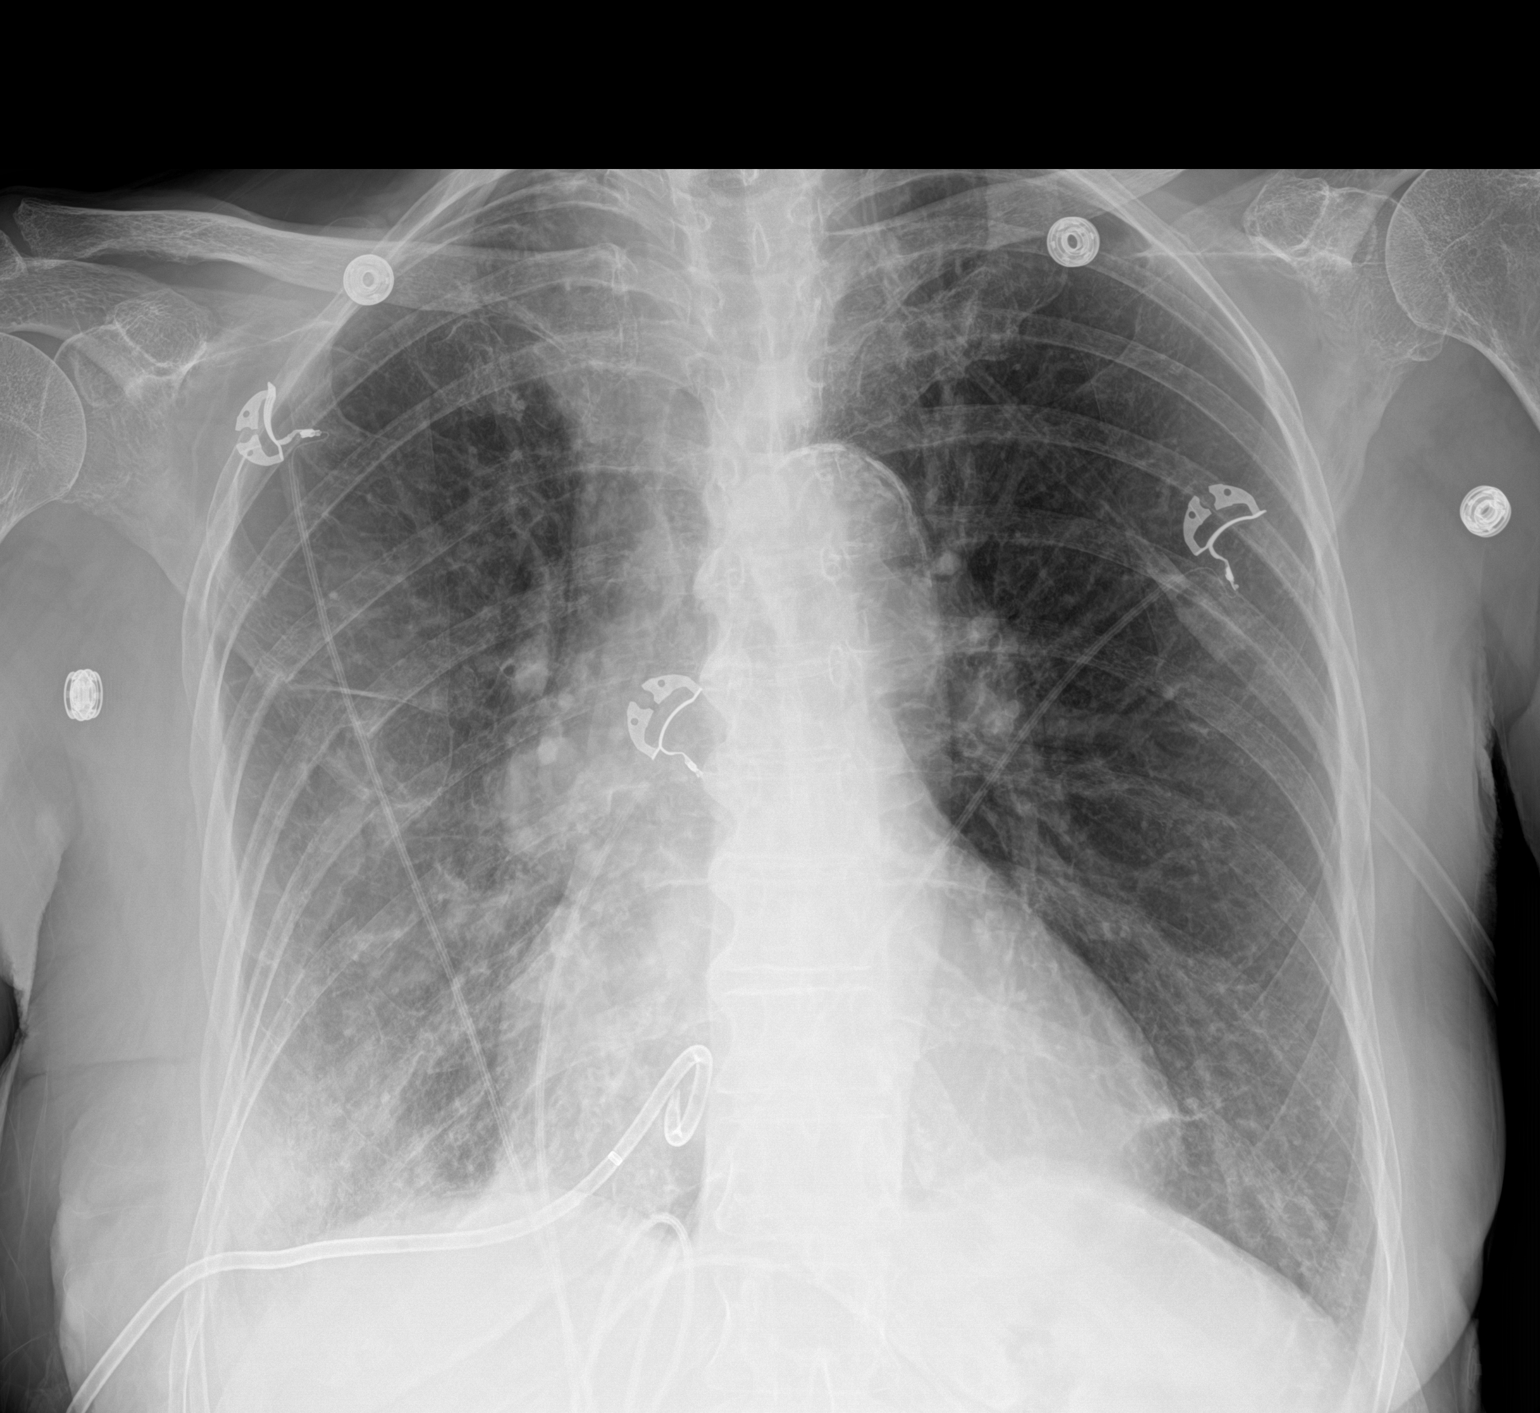

[1 of 1 positions shown; findings below may reference images not displayed]

FINDINGS: Stable cardiac silhouette. Small bore RIGHT chest tube in place.
Decrease in RIGHT pleural fluid volume. Small RIGHT apical
pneumothorax remains unchanged in volume measuring approximately 6
mm from the apical chest wall.

Multiple posterior RIGHT rib fractures again noted.
IMPRESSION: 1. RIGHT chest tube in place with small apical pneumothorax. No
change in pneumothorax volume.
2. Decrease in RIGHT pleural fluid.
3. Multiple posterior RIGHT rib fractures.

## 2021-12-12 IMAGING — DX DG CHEST 1V PORT
1 series · 2 of 2 positions shown · non-contrast
Comparison: Portable exam 9290 hours compared to 06/02/2021

CLINICAL DATA: Chest tube

EXAM:
PORTABLE CHEST 1 VIEW

[Series 1: chest · 0.14mm/px · 2 of 2 slices shown]
[im 1/2]
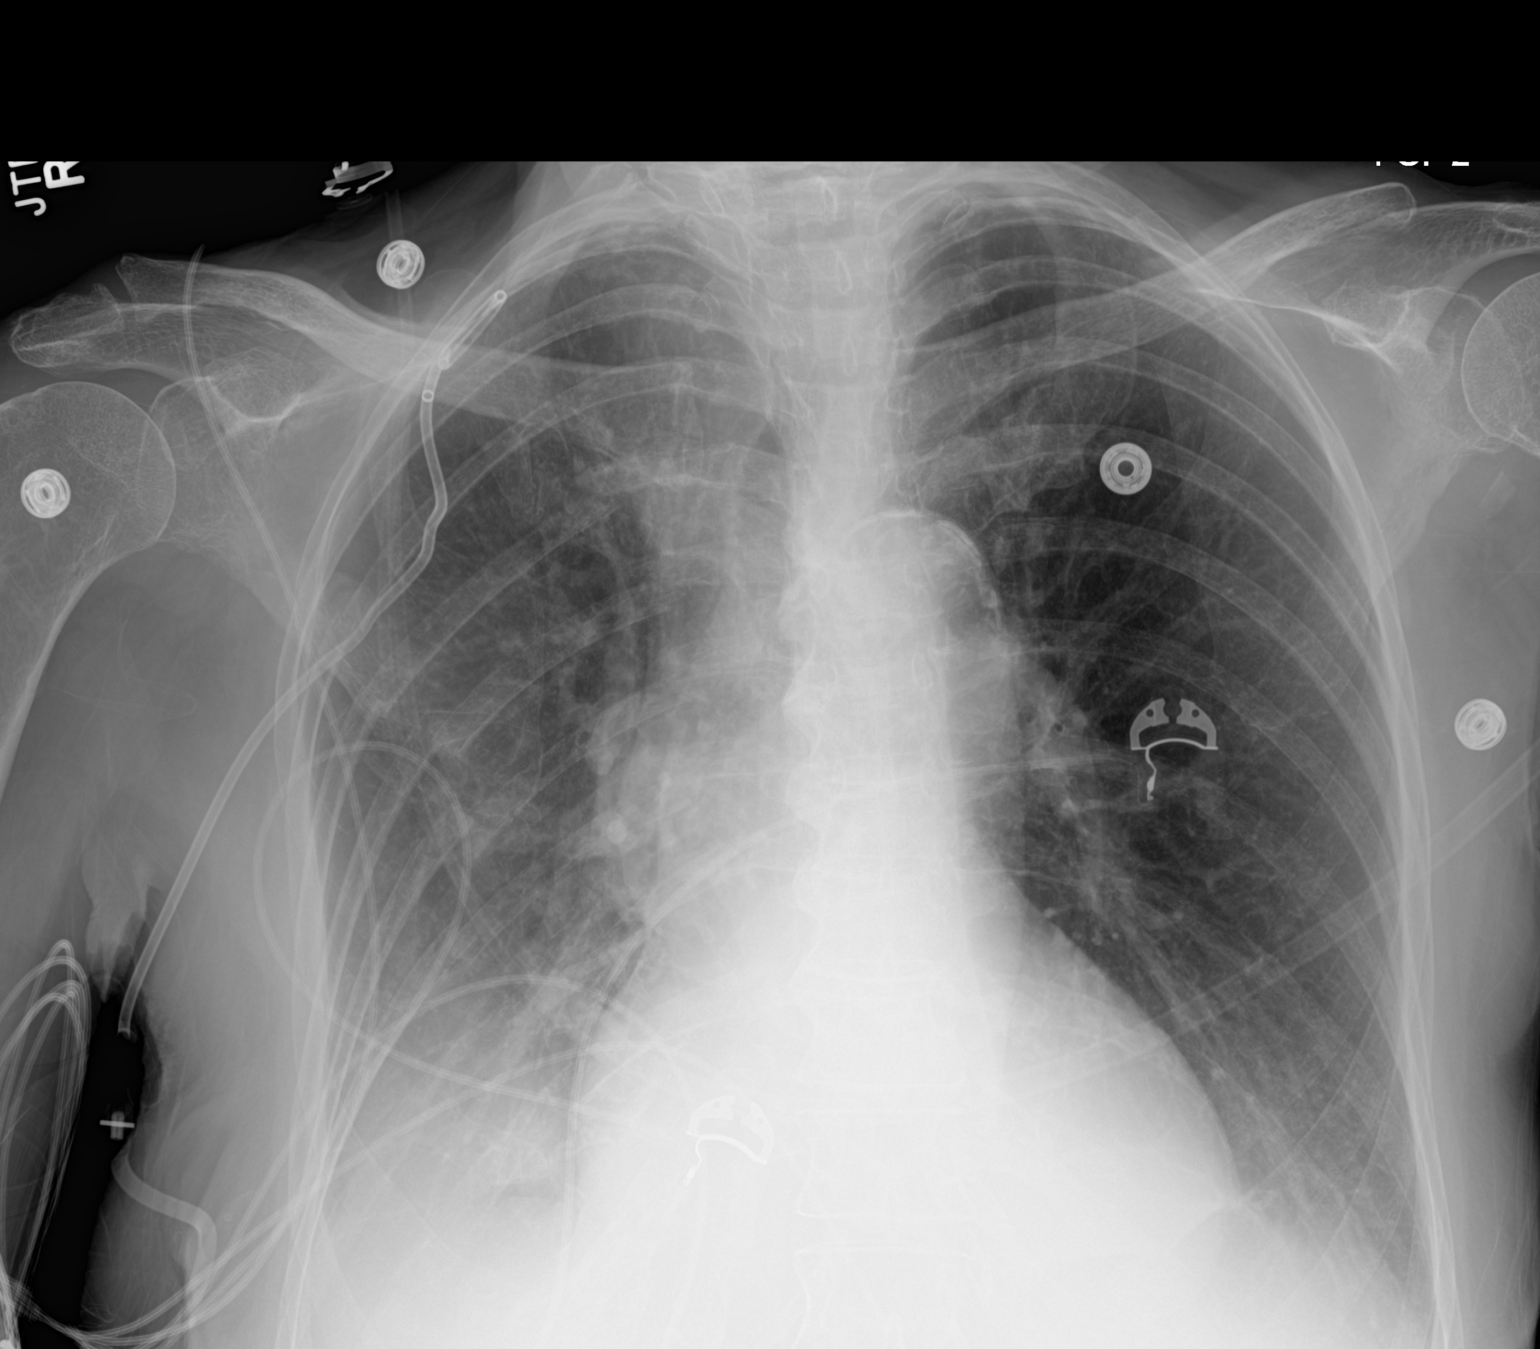
[im 2/2]
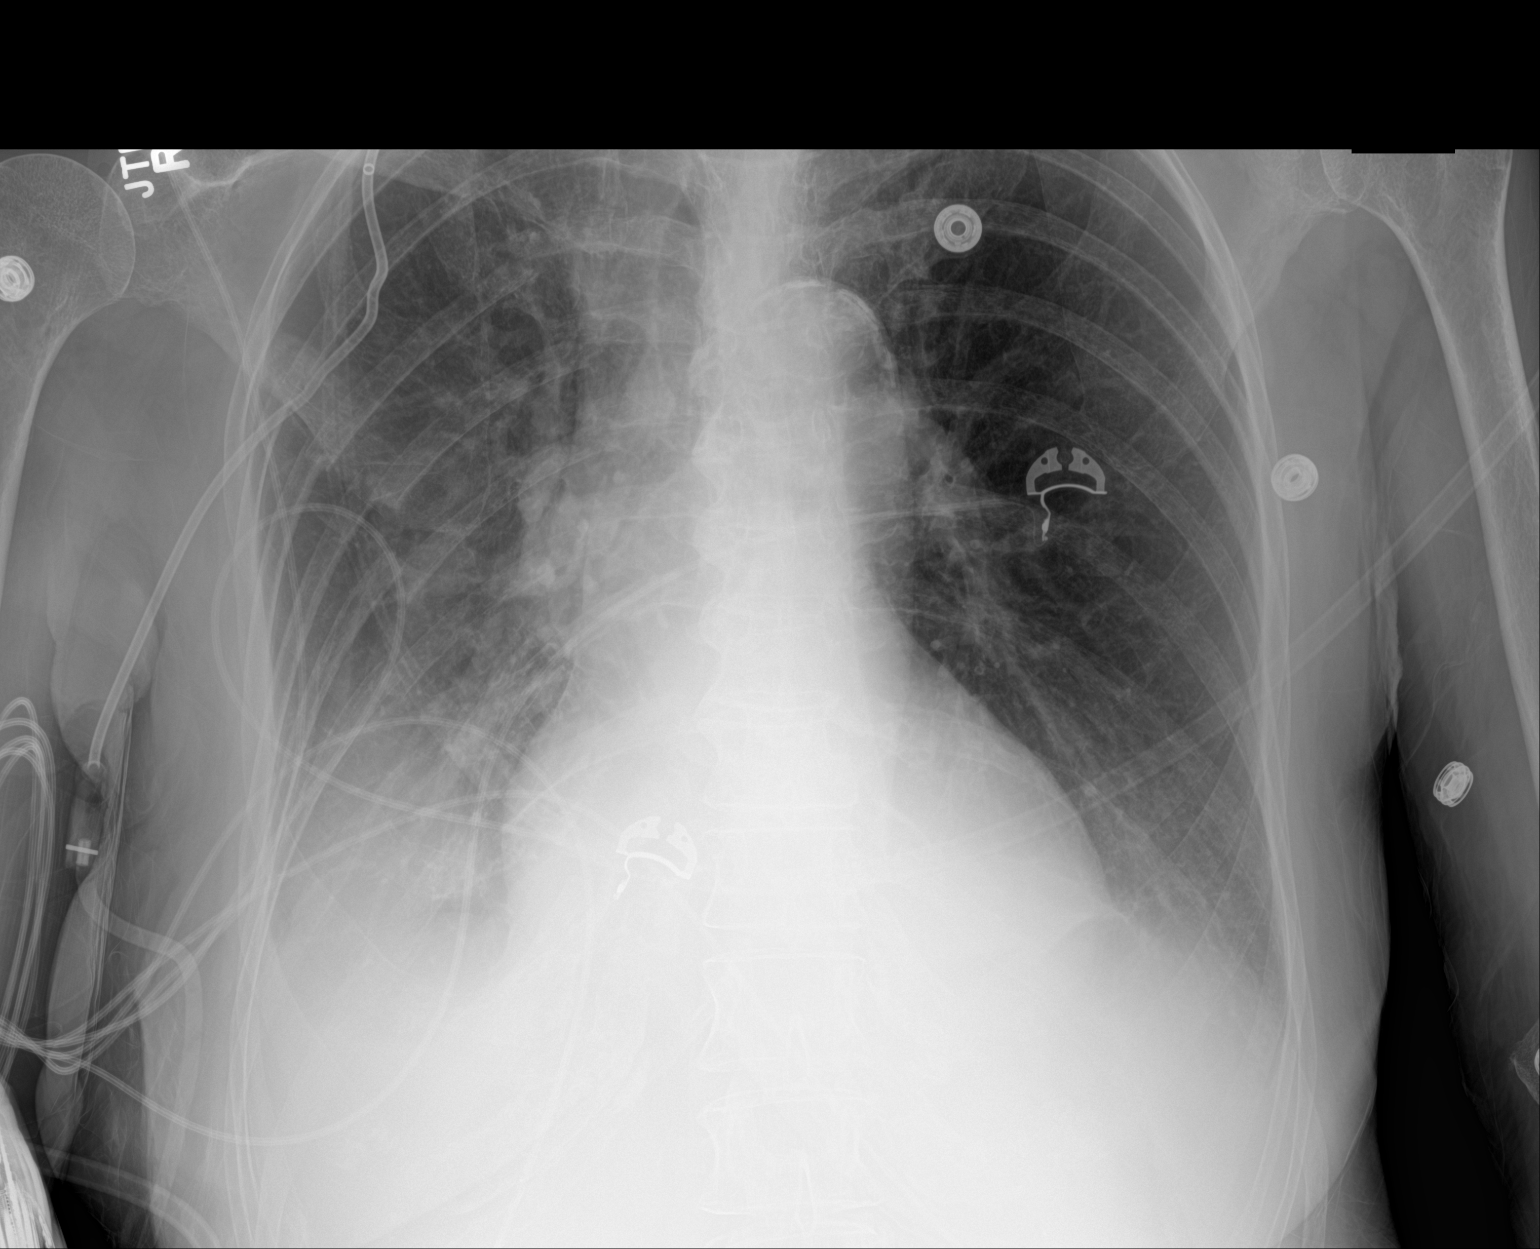

[2 of 2 positions shown; findings below may reference images not displayed]

FINDINGS: Pigtail RIGHT thoracostomy tube again identified.

Normal heart size, mediastinal contours, and pulmonary vascularity.

Atherosclerotic calcification aorta.

RIGHT pleural effusion without residual pneumothorax.

Underlying emphysematous changes.

Bones demineralized.
IMPRESSION: RIGHT thoracostomy tube without pneumothorax.

Emphysematous changes with small RIGHT pleural effusion.

Aortic Atherosclerosis (KLR3Z-617.7) and Emphysema (KLR3Z-JWA.1).

## 2021-12-13 IMAGING — DX DG CHEST 1V PORT
1 series · 1 of 1 positions shown · non-contrast
Comparison: Earlier today at 617 a.m.

CLINICAL DATA: Short of breath.  Evaluate right-sided chest tube.

EXAM:
PORTABLE CHEST 1 VIEW

[chest ap]
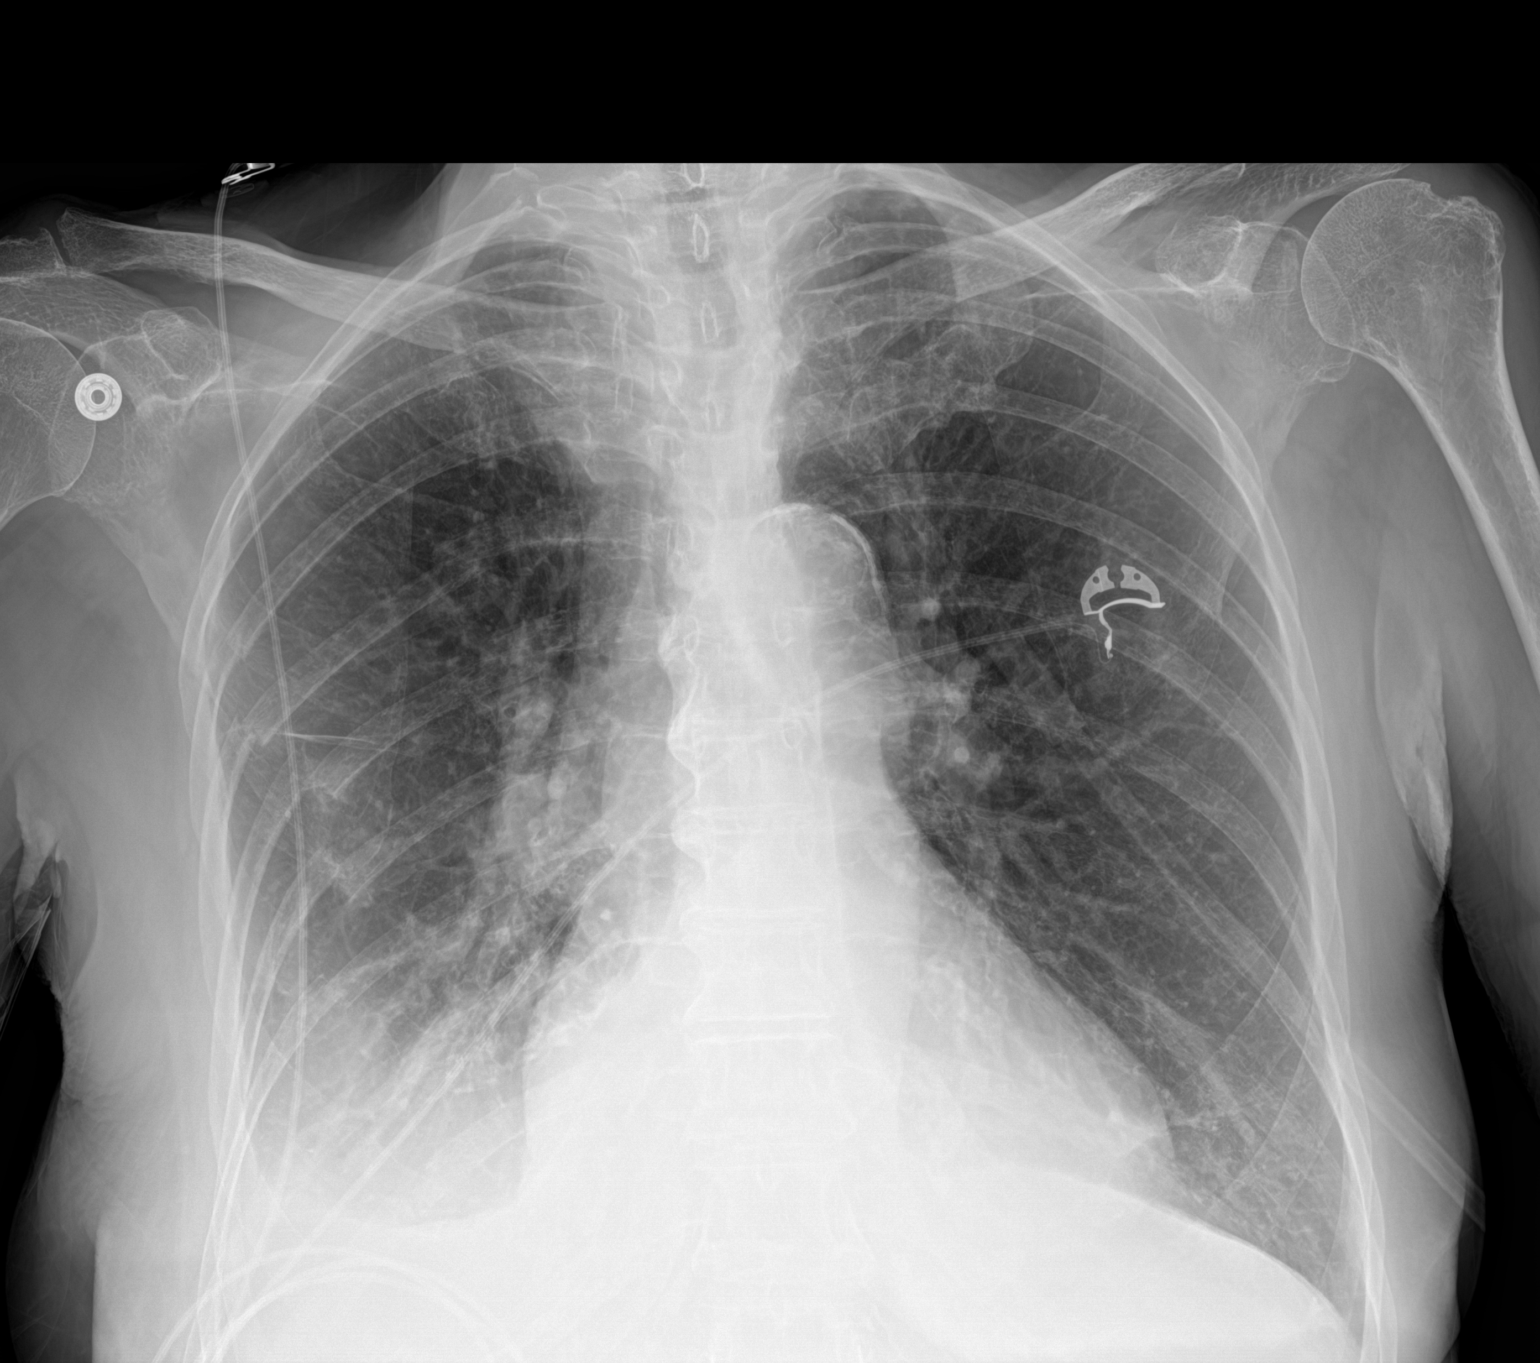

[1 of 1 positions shown; findings below may reference images not displayed]

FINDINGS: [DATE] p.m. Midline trachea. Mild cardiomegaly. Atherosclerosis in
the transverse aorta. Small right pleural effusion is similar no
pneumothorax. Right base airspace disease remains. Right upper lobe
scarring laterally. Displaced right rib fractures.
IMPRESSION: No pneumothorax.

Similar right pleural effusion and adjacent airspace disease.

Aortic Atherosclerosis ([id number].D).
